# Patient Record
Sex: Female | Born: 1940 | Race: White | Hispanic: No | Marital: Married | Smoking: Never smoker
Health system: Southern US, Community
[De-identification: ages and names within clinical notes are randomized; demographics above are authoritative.]

## PROBLEM LIST (undated history)

## (undated) DIAGNOSIS — K219 Gastro-esophageal reflux disease without esophagitis: Secondary | ICD-10-CM

## (undated) DIAGNOSIS — K5790 Diverticulosis of intestine, part unspecified, without perforation or abscess without bleeding: Secondary | ICD-10-CM

## (undated) DIAGNOSIS — J309 Allergic rhinitis, unspecified: Secondary | ICD-10-CM

## (undated) DIAGNOSIS — I4819 Other persistent atrial fibrillation: Secondary | ICD-10-CM

## (undated) DIAGNOSIS — M545 Low back pain, unspecified: Secondary | ICD-10-CM

## (undated) DIAGNOSIS — K449 Diaphragmatic hernia without obstruction or gangrene: Secondary | ICD-10-CM

## (undated) DIAGNOSIS — G8929 Other chronic pain: Secondary | ICD-10-CM

## (undated) DIAGNOSIS — I5022 Chronic systolic (congestive) heart failure: Secondary | ICD-10-CM

## (undated) DIAGNOSIS — I639 Cerebral infarction, unspecified: Secondary | ICD-10-CM

## (undated) DIAGNOSIS — F329 Major depressive disorder, single episode, unspecified: Secondary | ICD-10-CM

## (undated) DIAGNOSIS — I340 Nonrheumatic mitral (valve) insufficiency: Secondary | ICD-10-CM

## (undated) DIAGNOSIS — IMO0002 Reserved for concepts with insufficient information to code with codable children: Secondary | ICD-10-CM

## (undated) DIAGNOSIS — F32A Depression, unspecified: Secondary | ICD-10-CM

## (undated) DIAGNOSIS — N3281 Overactive bladder: Secondary | ICD-10-CM

## (undated) DIAGNOSIS — K2270 Barrett's esophagus without dysplasia: Secondary | ICD-10-CM

## (undated) DIAGNOSIS — I517 Cardiomegaly: Secondary | ICD-10-CM

## (undated) DIAGNOSIS — I10 Essential (primary) hypertension: Secondary | ICD-10-CM

## (undated) DIAGNOSIS — G4730 Sleep apnea, unspecified: Secondary | ICD-10-CM

## (undated) DIAGNOSIS — C50919 Malignant neoplasm of unspecified site of unspecified female breast: Secondary | ICD-10-CM

## (undated) DIAGNOSIS — I4891 Unspecified atrial fibrillation: Secondary | ICD-10-CM

## (undated) DIAGNOSIS — E039 Hypothyroidism, unspecified: Secondary | ICD-10-CM

## (undated) DIAGNOSIS — F419 Anxiety disorder, unspecified: Secondary | ICD-10-CM

## (undated) HISTORY — DX: Other persistent atrial fibrillation: I48.19

## (undated) HISTORY — DX: Nonrheumatic mitral (valve) insufficiency: I34.0

## (undated) HISTORY — PX: COLONOSCOPY: SHX174

## (undated) HISTORY — DX: Hypothyroidism, unspecified: E03.9

## (undated) HISTORY — DX: Major depressive disorder, single episode, unspecified: F32.9

## (undated) HISTORY — PX: ESOPHAGUS SURGERY: SHX626

## (undated) HISTORY — DX: Diaphragmatic hernia without obstruction or gangrene: K44.9

## (undated) HISTORY — PX: TONSILLECTOMY: SUR1361

## (undated) HISTORY — DX: Gastro-esophageal reflux disease without esophagitis: K21.9

## (undated) HISTORY — PX: MOHS SURGERY: SUR867

## (undated) HISTORY — DX: Essential (primary) hypertension: I10

## (undated) HISTORY — PX: LUMBAR DISC SURGERY: SHX700

## (undated) HISTORY — PX: BACK SURGERY: SHX140

## (undated) HISTORY — DX: Unspecified atrial fibrillation: I48.91

## (undated) HISTORY — DX: Cerebral infarction, unspecified: I63.9

## (undated) HISTORY — DX: Allergic rhinitis, unspecified: J30.9

## (undated) HISTORY — DX: Diverticulosis of intestine, part unspecified, without perforation or abscess without bleeding: K57.90

## (undated) HISTORY — DX: Cardiomegaly: I51.7

## (undated) HISTORY — PX: FRACTURE SURGERY: SHX138

## (undated) HISTORY — PX: ESOPHAGOGASTRODUODENOSCOPY: SHX1529

## (undated) HISTORY — DX: Depression, unspecified: F32.A

## (undated) HISTORY — DX: Barrett's esophagus without dysplasia: K22.70

---

## 1978-10-08 HISTORY — PX: TUBAL LIGATION: SHX77

## 1993-10-08 HISTORY — PX: CARPAL TUNNEL RELEASE: SHX101

## 1999-11-20 ENCOUNTER — Other Ambulatory Visit: Admission: RE | Admit: 1999-11-20 | Discharge: 1999-11-20 | Payer: Self-pay | Admitting: Obstetrics & Gynecology

## 2000-12-23 ENCOUNTER — Other Ambulatory Visit: Admission: RE | Admit: 2000-12-23 | Discharge: 2000-12-23 | Payer: Self-pay | Admitting: Obstetrics & Gynecology

## 2001-08-19 ENCOUNTER — Other Ambulatory Visit: Admission: RE | Admit: 2001-08-19 | Discharge: 2001-08-19 | Payer: Self-pay | Admitting: Obstetrics & Gynecology

## 2002-08-17 ENCOUNTER — Other Ambulatory Visit: Admission: RE | Admit: 2002-08-17 | Discharge: 2002-08-17 | Payer: Self-pay | Admitting: Obstetrics & Gynecology

## 2003-10-04 ENCOUNTER — Other Ambulatory Visit: Admission: RE | Admit: 2003-10-04 | Discharge: 2003-10-04 | Payer: Self-pay | Admitting: Obstetrics & Gynecology

## 2003-10-09 DIAGNOSIS — C50919 Malignant neoplasm of unspecified site of unspecified female breast: Secondary | ICD-10-CM

## 2003-10-09 DIAGNOSIS — Z923 Personal history of irradiation: Secondary | ICD-10-CM

## 2003-10-09 HISTORY — DX: Personal history of irradiation: Z92.3

## 2003-10-09 HISTORY — PX: BREAST BIOPSY: SHX20

## 2003-10-09 HISTORY — PX: BREAST LUMPECTOMY: SHX2

## 2003-10-09 HISTORY — DX: Malignant neoplasm of unspecified site of unspecified female breast: C50.919

## 2003-10-14 ENCOUNTER — Encounter (INDEPENDENT_AMBULATORY_CARE_PROVIDER_SITE_OTHER): Payer: Self-pay | Admitting: Specialist

## 2003-10-14 ENCOUNTER — Encounter (INDEPENDENT_AMBULATORY_CARE_PROVIDER_SITE_OTHER): Payer: Self-pay | Admitting: Obstetrics & Gynecology

## 2003-10-14 ENCOUNTER — Encounter: Admission: RE | Admit: 2003-10-14 | Discharge: 2003-10-14 | Payer: Self-pay | Admitting: Obstetrics & Gynecology

## 2003-10-26 ENCOUNTER — Encounter (HOSPITAL_COMMUNITY): Admission: RE | Admit: 2003-10-26 | Discharge: 2004-01-24 | Payer: Self-pay | Admitting: General Surgery

## 2003-10-28 ENCOUNTER — Encounter: Admission: RE | Admit: 2003-10-28 | Discharge: 2003-10-28 | Payer: Self-pay | Admitting: General Surgery

## 2003-11-01 ENCOUNTER — Encounter: Admission: RE | Admit: 2003-11-01 | Discharge: 2003-11-01 | Payer: Self-pay | Admitting: General Surgery

## 2003-11-01 ENCOUNTER — Ambulatory Visit (HOSPITAL_BASED_OUTPATIENT_CLINIC_OR_DEPARTMENT_OTHER): Admission: RE | Admit: 2003-11-01 | Discharge: 2003-11-01 | Payer: Self-pay | Admitting: General Surgery

## 2003-11-01 ENCOUNTER — Ambulatory Visit (HOSPITAL_COMMUNITY): Admission: RE | Admit: 2003-11-01 | Discharge: 2003-11-01 | Payer: Self-pay | Admitting: General Surgery

## 2003-11-01 ENCOUNTER — Encounter (INDEPENDENT_AMBULATORY_CARE_PROVIDER_SITE_OTHER): Payer: Self-pay | Admitting: *Deleted

## 2003-11-22 ENCOUNTER — Ambulatory Visit: Admission: RE | Admit: 2003-11-22 | Discharge: 2004-01-28 | Payer: Self-pay | Admitting: Radiation Oncology

## 2003-11-23 ENCOUNTER — Ambulatory Visit (HOSPITAL_COMMUNITY): Admission: RE | Admit: 2003-11-23 | Discharge: 2003-11-23 | Payer: Self-pay | Admitting: Oncology

## 2003-11-24 ENCOUNTER — Ambulatory Visit (HOSPITAL_COMMUNITY): Admission: RE | Admit: 2003-11-24 | Discharge: 2003-11-24 | Payer: Self-pay | Admitting: Oncology

## 2004-03-02 ENCOUNTER — Ambulatory Visit: Admission: RE | Admit: 2004-03-02 | Discharge: 2004-03-02 | Payer: Self-pay | Admitting: Radiation Oncology

## 2004-07-25 ENCOUNTER — Encounter: Admission: RE | Admit: 2004-07-25 | Discharge: 2004-07-25 | Payer: Self-pay | Admitting: Oncology

## 2004-10-11 ENCOUNTER — Encounter: Admission: RE | Admit: 2004-10-11 | Discharge: 2004-10-11 | Payer: Self-pay | Admitting: General Surgery

## 2004-11-15 ENCOUNTER — Ambulatory Visit: Payer: Self-pay | Admitting: Oncology

## 2005-01-18 ENCOUNTER — Ambulatory Visit: Payer: Self-pay | Admitting: Oncology

## 2005-07-25 ENCOUNTER — Ambulatory Visit: Payer: Self-pay | Admitting: Oncology

## 2005-07-31 ENCOUNTER — Encounter: Admission: RE | Admit: 2005-07-31 | Discharge: 2005-07-31 | Payer: Self-pay | Admitting: Oncology

## 2006-01-17 ENCOUNTER — Ambulatory Visit: Payer: Self-pay | Admitting: Oncology

## 2006-01-21 LAB — CBC WITH DIFFERENTIAL/PLATELET
BASO%: 0.7 % (ref 0.0–2.0)
EOS%: 5.7 % (ref 0.0–7.0)
HCT: 38.9 % (ref 34.8–46.6)
LYMPH%: 22.5 % (ref 14.0–48.0)
MCH: 26.3 pg (ref 26.0–34.0)
MCHC: 33.1 g/dL (ref 32.0–36.0)
MONO#: 0.6 10*3/uL (ref 0.1–0.9)
NEUT%: 63.9 % (ref 39.6–76.8)
RBC: 4.91 10*6/uL (ref 3.70–5.32)
WBC: 8 10*3/uL (ref 3.9–10.0)
lymph#: 1.8 10*3/uL (ref 0.9–3.3)

## 2006-01-21 LAB — COMPREHENSIVE METABOLIC PANEL
ALT: 10 U/L (ref 0–40)
CO2: 30 mEq/L (ref 19–32)
Sodium: 140 mEq/L (ref 135–145)
Total Bilirubin: 0.7 mg/dL (ref 0.3–1.2)
Total Protein: 7.5 g/dL (ref 6.0–8.3)

## 2006-01-21 LAB — CANCER ANTIGEN 27.29: CA 27.29: 22 U/mL (ref 0–39)

## 2006-02-19 ENCOUNTER — Encounter: Admission: RE | Admit: 2006-02-19 | Discharge: 2006-02-19 | Payer: Self-pay | Admitting: Oncology

## 2006-07-11 ENCOUNTER — Ambulatory Visit: Payer: Self-pay | Admitting: Oncology

## 2007-01-14 ENCOUNTER — Ambulatory Visit: Payer: Self-pay | Admitting: Oncology

## 2007-01-16 LAB — CBC WITH DIFFERENTIAL/PLATELET
BASO%: 0.6 % (ref 0.0–2.0)
EOS%: 4 % (ref 0.0–7.0)
LYMPH%: 21.6 % (ref 14.0–48.0)
MCH: 25.4 pg — ABNORMAL LOW (ref 26.0–34.0)
MCHC: 33.5 g/dL (ref 32.0–36.0)
MCV: 75.8 fL — ABNORMAL LOW (ref 81.0–101.0)
MONO#: 0.5 10*3/uL (ref 0.1–0.9)
MONO%: 6.6 % (ref 0.0–13.0)
Platelets: 418 10*3/uL — ABNORMAL HIGH (ref 145–400)
RBC: 4.65 10*6/uL (ref 3.70–5.32)
WBC: 7.9 10*3/uL (ref 3.9–10.0)

## 2007-01-16 LAB — COMPREHENSIVE METABOLIC PANEL
ALT: 9 U/L (ref 0–35)
Alkaline Phosphatase: 72 U/L (ref 39–117)
Sodium: 137 mEq/L (ref 135–145)
Total Bilirubin: 0.7 mg/dL (ref 0.3–1.2)
Total Protein: 7.3 g/dL (ref 6.0–8.3)

## 2007-01-24 LAB — CBC & DIFF AND RETIC
Basophils Absolute: 0 10*3/uL (ref 0.0–0.1)
Eosinophils Absolute: 0.4 10*3/uL (ref 0.0–0.5)
HGB: 11.8 g/dL (ref 11.6–15.9)
LYMPH%: 19.5 % (ref 14.0–48.0)
MCV: 75.8 fL — ABNORMAL LOW (ref 81.0–101.0)
MONO%: 6.5 % (ref 0.0–13.0)
NEUT#: 6 10*3/uL (ref 1.5–6.5)
Platelets: 396 10*3/uL (ref 145–400)
RBC: 4.67 10*6/uL (ref 3.70–5.32)

## 2007-01-24 LAB — IRON AND TIBC
%SAT: 9 % — ABNORMAL LOW (ref 20–55)
Iron: 33 ug/dL — ABNORMAL LOW (ref 42–145)
UIBC: 351 ug/dL

## 2007-01-29 ENCOUNTER — Ambulatory Visit: Payer: Self-pay | Admitting: Internal Medicine

## 2007-02-12 ENCOUNTER — Ambulatory Visit: Payer: Self-pay | Admitting: Internal Medicine

## 2007-02-25 ENCOUNTER — Encounter: Admission: RE | Admit: 2007-02-25 | Discharge: 2007-02-25 | Payer: Self-pay | Admitting: Oncology

## 2007-07-14 ENCOUNTER — Encounter: Admission: RE | Admit: 2007-07-14 | Discharge: 2007-07-14 | Payer: Self-pay | Admitting: Oncology

## 2007-07-30 ENCOUNTER — Ambulatory Visit: Payer: Self-pay | Admitting: Oncology

## 2007-08-01 LAB — CBC & DIFF AND RETIC
BASO%: 0.5 % (ref 0.0–2.0)
EOS%: 5.5 % (ref 0.0–7.0)
HCT: 36.9 % (ref 34.8–46.6)
IRF: 0.39 — ABNORMAL HIGH (ref 0.130–0.330)
MCH: 25.7 pg — ABNORMAL LOW (ref 26.0–34.0)
MCHC: 33.4 g/dL (ref 32.0–36.0)
NEUT%: 64.7 % (ref 39.6–76.8)
RBC: 4.79 10*6/uL (ref 3.70–5.32)
RDW: 15.2 % — ABNORMAL HIGH (ref 11.3–14.5)
lymph#: 1.9 10*3/uL (ref 0.9–3.3)

## 2007-08-01 LAB — COMPREHENSIVE METABOLIC PANEL
AST: 16 U/L (ref 0–37)
BUN: 21 mg/dL (ref 6–23)
Calcium: 9.2 mg/dL (ref 8.4–10.5)
Chloride: 99 mEq/L (ref 96–112)
Creatinine, Ser: 1.16 mg/dL (ref 0.40–1.20)

## 2007-08-01 LAB — IRON AND TIBC
%SAT: 13 % — ABNORMAL LOW (ref 20–55)
Iron: 52 ug/dL (ref 42–145)
TIBC: 409 ug/dL (ref 250–470)

## 2007-08-01 LAB — MORPHOLOGY

## 2007-08-08 LAB — COMPREHENSIVE METABOLIC PANEL
ALT: 11 U/L (ref 0–35)
AST: 16 U/L (ref 0–37)
Albumin: 4 g/dL (ref 3.5–5.2)
Alkaline Phosphatase: 74 U/L (ref 39–117)
Calcium: 9.5 mg/dL (ref 8.4–10.5)
Chloride: 101 mEq/L (ref 96–112)
Creatinine, Ser: 1.25 mg/dL — ABNORMAL HIGH (ref 0.40–1.20)
Potassium: 3.6 mEq/L (ref 3.5–5.3)

## 2007-08-08 LAB — CBC & DIFF AND RETIC
Basophils Absolute: 0 10*3/uL (ref 0.0–0.1)
Eosinophils Absolute: 0.4 10*3/uL (ref 0.0–0.5)
HGB: 12.4 g/dL (ref 11.6–15.9)
IRF: 0.34 — ABNORMAL HIGH (ref 0.130–0.330)
NEUT#: 6.4 10*3/uL (ref 1.5–6.5)
RDW: 15.4 % — ABNORMAL HIGH (ref 11.3–14.5)
RETIC #: 97.6 10*3/uL (ref 19.7–115.1)
WBC: 9.3 10*3/uL (ref 3.9–10.0)
lymph#: 1.9 10*3/uL (ref 0.9–3.3)

## 2007-08-08 LAB — MORPHOLOGY: PLT EST: INCREASED

## 2007-08-08 LAB — CHCC SMEAR

## 2007-10-09 HISTORY — PX: ELBOW FRACTURE SURGERY: SHX616

## 2008-01-23 ENCOUNTER — Ambulatory Visit: Payer: Self-pay | Admitting: Oncology

## 2008-01-27 LAB — CBC WITH DIFFERENTIAL/PLATELET
BASO%: 0.7 % (ref 0.0–2.0)
EOS%: 8.3 % — ABNORMAL HIGH (ref 0.0–7.0)
HCT: 37.8 % (ref 34.8–46.6)
MCH: 26.7 pg (ref 26.0–34.0)
MCHC: 33.7 g/dL (ref 32.0–36.0)
MONO#: 0.4 10*3/uL (ref 0.1–0.9)
NEUT%: 59.8 % (ref 39.6–76.8)
RBC: 4.76 10*6/uL (ref 3.70–5.32)
RDW: 15.3 % — ABNORMAL HIGH (ref 11.3–14.5)
WBC: 8.1 10*3/uL (ref 3.9–10.0)
lymph#: 2.1 10*3/uL (ref 0.9–3.3)

## 2008-01-27 LAB — COMPREHENSIVE METABOLIC PANEL
ALT: 12 U/L (ref 0–35)
AST: 15 U/L (ref 0–37)
CO2: 27 mEq/L (ref 19–32)
Calcium: 9.3 mg/dL (ref 8.4–10.5)
Chloride: 104 mEq/L (ref 96–112)
Potassium: 3.7 mEq/L (ref 3.5–5.3)
Sodium: 141 mEq/L (ref 135–145)
Total Protein: 7.1 g/dL (ref 6.0–8.3)

## 2008-02-05 ENCOUNTER — Encounter: Payer: Self-pay | Admitting: Internal Medicine

## 2008-02-26 ENCOUNTER — Encounter: Admission: RE | Admit: 2008-02-26 | Discharge: 2008-02-26 | Payer: Self-pay | Admitting: Oncology

## 2008-08-24 ENCOUNTER — Ambulatory Visit: Payer: Self-pay | Admitting: Oncology

## 2008-08-26 LAB — CBC WITH DIFFERENTIAL/PLATELET
BASO%: 0.7 % (ref 0.0–2.0)
Eosinophils Absolute: 0.6 10*3/uL — ABNORMAL HIGH (ref 0.0–0.5)
LYMPH%: 22 % (ref 14.0–48.0)
MCHC: 33.2 g/dL (ref 32.0–36.0)
MCV: 83.6 fL (ref 81.0–101.0)
MONO#: 0.5 10*3/uL (ref 0.1–0.9)
MONO%: 5.5 % (ref 0.0–13.0)
NEUT#: 5.8 10*3/uL (ref 1.5–6.5)
Platelets: 341 10*3/uL (ref 145–400)
RBC: 4.8 10*6/uL (ref 3.70–5.32)
RDW: 13.8 % (ref 11.3–14.5)
WBC: 8.9 10*3/uL (ref 3.9–10.0)

## 2008-08-27 LAB — COMPREHENSIVE METABOLIC PANEL
ALT: 8 U/L (ref 0–35)
Albumin: 3.9 g/dL (ref 3.5–5.2)
Alkaline Phosphatase: 56 U/L (ref 39–117)
Glucose, Bld: 110 mg/dL — ABNORMAL HIGH (ref 70–99)
Potassium: 3.9 mEq/L (ref 3.5–5.3)
Sodium: 136 mEq/L (ref 135–145)
Total Bilirubin: 0.7 mg/dL (ref 0.3–1.2)
Total Protein: 6.9 g/dL (ref 6.0–8.3)

## 2008-08-27 LAB — CANCER ANTIGEN 27.29: CA 27.29: 21 U/mL (ref 0–39)

## 2008-08-27 LAB — VITAMIN D 25 HYDROXY (VIT D DEFICIENCY, FRACTURES): Vit D, 25-Hydroxy: 41 ng/mL (ref 30–89)

## 2008-08-31 ENCOUNTER — Encounter: Payer: Self-pay | Admitting: Internal Medicine

## 2008-09-27 ENCOUNTER — Emergency Department (HOSPITAL_BASED_OUTPATIENT_CLINIC_OR_DEPARTMENT_OTHER): Admission: EM | Admit: 2008-09-27 | Discharge: 2008-09-27 | Payer: Self-pay | Admitting: Emergency Medicine

## 2008-09-27 ENCOUNTER — Ambulatory Visit: Payer: Self-pay | Admitting: Diagnostic Radiology

## 2008-09-29 ENCOUNTER — Encounter: Payer: Self-pay | Admitting: Internal Medicine

## 2008-10-04 ENCOUNTER — Inpatient Hospital Stay (HOSPITAL_COMMUNITY): Admission: RE | Admit: 2008-10-04 | Discharge: 2008-10-07 | Payer: Self-pay | Admitting: Orthopedic Surgery

## 2008-10-18 ENCOUNTER — Encounter: Payer: Self-pay | Admitting: Internal Medicine

## 2008-10-28 ENCOUNTER — Encounter: Payer: Self-pay | Admitting: Internal Medicine

## 2008-11-17 ENCOUNTER — Encounter: Payer: Self-pay | Admitting: Internal Medicine

## 2009-01-28 ENCOUNTER — Ambulatory Visit: Payer: Self-pay | Admitting: Oncology

## 2009-02-01 ENCOUNTER — Encounter: Payer: Self-pay | Admitting: Internal Medicine

## 2009-02-01 LAB — COMPREHENSIVE METABOLIC PANEL
ALT: <8 U/L (ref 0–35)
Albumin: 4 g/dL (ref 3.5–5.2)
CO2: 27 mEq/L (ref 19–32)
Glucose, Bld: 90 mg/dL (ref 70–99)
Potassium: 4 mEq/L (ref 3.5–5.3)
Sodium: 139 mEq/L (ref 135–145)
Total Protein: 7.5 g/dL (ref 6.0–8.3)

## 2009-02-01 LAB — CBC WITH DIFFERENTIAL/PLATELET
BASO%: 0.6 % (ref 0.0–2.0)
Eosinophils Absolute: 0.6 10*3/uL — ABNORMAL HIGH (ref 0.0–0.5)
MCHC: 32.5 g/dL (ref 31.5–36.0)
MONO#: 0.5 10*3/uL (ref 0.1–0.9)
NEUT#: 5.5 10*3/uL (ref 1.5–6.5)
RBC: 4.93 10*6/uL (ref 3.70–5.45)
RDW: 14.6 % — ABNORMAL HIGH (ref 11.2–14.5)
WBC: 8.9 10*3/uL (ref 3.9–10.3)
lymph#: 2.2 10*3/uL (ref 0.9–3.3)

## 2009-02-28 ENCOUNTER — Encounter: Admission: RE | Admit: 2009-02-28 | Discharge: 2009-02-28 | Payer: Self-pay | Admitting: Oncology

## 2009-07-21 ENCOUNTER — Ambulatory Visit: Payer: Self-pay | Admitting: Oncology

## 2009-07-25 ENCOUNTER — Encounter: Payer: Self-pay | Admitting: Internal Medicine

## 2009-07-25 ENCOUNTER — Encounter: Admission: RE | Admit: 2009-07-25 | Discharge: 2009-07-25 | Payer: Self-pay | Admitting: Oncology

## 2009-07-25 LAB — CBC WITH DIFFERENTIAL/PLATELET
BASO%: 0.8 % (ref 0.0–2.0)
Basophils Absolute: 0.1 10*3/uL (ref 0.0–0.1)
HCT: 39.5 % (ref 34.8–46.6)
LYMPH%: 19.7 % (ref 14.0–49.7)
MCH: 26.5 pg (ref 25.1–34.0)
MCHC: 32.6 g/dL (ref 31.5–36.0)
MONO#: 0.5 10*3/uL (ref 0.1–0.9)
NEUT%: 64.9 % (ref 38.4–76.8)
Platelets: 364 10*3/uL (ref 145–400)
WBC: 8.6 10*3/uL (ref 3.9–10.3)

## 2009-07-26 LAB — VITAMIN D 25 HYDROXY (VIT D DEFICIENCY, FRACTURES): Vit D, 25-Hydroxy: 40 ng/mL (ref 30–89)

## 2009-07-26 LAB — COMPREHENSIVE METABOLIC PANEL
ALT: <8 U/L (ref 0–35)
BUN: 18 mg/dL (ref 6–23)
CO2: 28 mEq/L (ref 19–32)
Creatinine, Ser: 1.04 mg/dL (ref 0.40–1.20)
Total Bilirubin: 0.4 mg/dL (ref 0.3–1.2)

## 2009-07-26 LAB — CANCER ANTIGEN 27.29: CA 27.29: 22 U/mL (ref 0–39)

## 2009-07-26 LAB — LACTATE DEHYDROGENASE: LDH: 157 U/L (ref 94–250)

## 2009-08-03 ENCOUNTER — Encounter (INDEPENDENT_AMBULATORY_CARE_PROVIDER_SITE_OTHER): Payer: Self-pay | Admitting: *Deleted

## 2009-08-19 ENCOUNTER — Encounter (INDEPENDENT_AMBULATORY_CARE_PROVIDER_SITE_OTHER): Payer: Self-pay | Admitting: *Deleted

## 2009-08-22 ENCOUNTER — Ambulatory Visit: Payer: Self-pay | Admitting: Internal Medicine

## 2009-09-06 ENCOUNTER — Ambulatory Visit: Payer: Self-pay | Admitting: Internal Medicine

## 2009-09-12 ENCOUNTER — Encounter: Payer: Self-pay | Admitting: Internal Medicine

## 2010-01-16 ENCOUNTER — Ambulatory Visit: Payer: Self-pay | Admitting: Oncology

## 2010-01-18 ENCOUNTER — Encounter: Payer: Self-pay | Admitting: Internal Medicine

## 2010-01-18 LAB — CBC WITH DIFFERENTIAL/PLATELET
BASO%: 0.6 % (ref 0.0–2.0)
Basophils Absolute: 0.1 10*3/uL (ref 0.0–0.1)
Eosinophils Absolute: 0.6 10*3/uL — ABNORMAL HIGH (ref 0.0–0.5)
HCT: 38.6 % (ref 34.8–46.6)
HGB: 12.6 g/dL (ref 11.6–15.9)
MCHC: 32.5 g/dL (ref 31.5–36.0)
MONO#: 0.5 10*3/uL (ref 0.1–0.9)
NEUT#: 7.7 10*3/uL — ABNORMAL HIGH (ref 1.5–6.5)
NEUT%: 69.9 % (ref 38.4–76.8)
WBC: 11 10*3/uL — ABNORMAL HIGH (ref 3.9–10.3)
lymph#: 2.1 10*3/uL (ref 0.9–3.3)

## 2010-01-18 LAB — LACTATE DEHYDROGENASE: LDH: 138 U/L (ref 94–250)

## 2010-01-18 LAB — COMPREHENSIVE METABOLIC PANEL
ALT: 11 U/L (ref 0–35)
CO2: 24 mEq/L (ref 19–32)
Calcium: 9.1 mg/dL (ref 8.4–10.5)
Chloride: 100 mEq/L (ref 96–112)
Creatinine, Ser: 1.34 mg/dL — ABNORMAL HIGH (ref 0.40–1.20)
Total Protein: 7.5 g/dL (ref 6.0–8.3)

## 2010-01-19 LAB — FERRITIN: Ferritin: 13 ng/mL (ref 10–291)

## 2010-01-19 LAB — IRON AND TIBC: %SAT: 8 % — ABNORMAL LOW (ref 20–55)

## 2010-03-14 ENCOUNTER — Encounter: Admission: RE | Admit: 2010-03-14 | Discharge: 2010-03-14 | Payer: Self-pay | Admitting: Oncology

## 2010-07-13 ENCOUNTER — Encounter: Payer: Self-pay | Admitting: Internal Medicine

## 2010-07-31 ENCOUNTER — Encounter (INDEPENDENT_AMBULATORY_CARE_PROVIDER_SITE_OTHER): Payer: Self-pay | Admitting: *Deleted

## 2010-08-23 ENCOUNTER — Encounter (INDEPENDENT_AMBULATORY_CARE_PROVIDER_SITE_OTHER): Payer: Self-pay | Admitting: *Deleted

## 2010-08-24 ENCOUNTER — Ambulatory Visit: Payer: Self-pay | Admitting: Internal Medicine

## 2010-08-30 ENCOUNTER — Telehealth (INDEPENDENT_AMBULATORY_CARE_PROVIDER_SITE_OTHER): Payer: Self-pay | Admitting: *Deleted

## 2010-09-07 ENCOUNTER — Ambulatory Visit: Payer: Self-pay | Admitting: Internal Medicine

## 2010-09-08 ENCOUNTER — Encounter: Payer: Self-pay | Admitting: Internal Medicine

## 2010-10-28 ENCOUNTER — Encounter: Payer: Self-pay | Admitting: General Surgery

## 2010-10-29 ENCOUNTER — Encounter: Payer: Self-pay | Admitting: Oncology

## 2010-11-07 NOTE — Miscellaneous (Signed)
Summary: [facility] PREVISIT/PREP  Clinical Lists Changes  Observations: Added new observation of NKA: T (08/24/2010 10:30)

## 2010-11-07 NOTE — Letter (Signed)
Summary: EGD Instructions  [facility]  [address] [place], [place] [address]   Phone: [phone]  Fax: [phone]       Teresa York    [birth year]    MRN: [id number]       Procedure Day [name]:  [name]  09/07/10     Arrival Time:   10:30AM     Procedure Time:  11:30AM     Location of Procedure:                    [name] ([facility]th [facility])    PREPARATION FOR ENDOSCOPY   On 09/07/10 THE DAY OF THE PROCEDURE:  1.   No solid foods, milk or milk products are allowed after midnight the night before your procedure.  2.   Do not drink anything colored red or purple.  Avoid juices with pulp.  No orange juice.  3.  You may drink clear liquids until 9:30AM, which is 2 hours before your procedure.                                                                                                CLEAR LIQUIDS INCLUDE: Water Jello Ice Popsicles Tea (sugar ok, no milk/cream) Powdered fruit flavored drinks Coffee (sugar ok, no milk/cream) Gatorade Juice: apple, white grape, white cranberry  Lemonade Clear bullion, consomm, broth Carbonated beverages (any kind) Strained chicken noodle soup Hard Candy   MEDICATION INSTRUCTIONS  Unless otherwise instructed, you should take regular prescription medications with a small sip of water as early as possible the morning of your procedure.  Additional medication instructions: Hold iron the morning of procedure.             OTHER INSTRUCTIONS  You will need a responsible adult at least 72 years of age to accompany you and drive you home.   This person must remain in the waiting room during your procedure.  Wear loose fitting clothing that is easily removed.  Leave jewelry and other valuables at home.  However, you may wish to bring a book to read or an iPod/MP3 player to listen to music as you wait for your procedure to start.  Remove all body piercing jewelry and leave at home.  Total time from  sign-in until discharge is approximately 2-3 hours.  You should go home directly after your procedure and rest.  You can resume normal activities the day after your procedure.  The day of your procedure you should not:   Drive   Make legal decisions   Operate machinery   Drink alcohol   Return to work  You will receive specific instructions about eating, activities and medications before you leave.    The above instructions have been reviewed and explained to me by  Wyona Almas RN  August 24, 2010 11:05 AM     I fully understand and can verbalize these instructions _____________________________ Date _________

## 2010-11-07 NOTE — Letter (Signed)
Summary: Pre Visit Letter Revised  [facility]  [address] [place], [place] [address]   Phone: [phone]  Fax: [phone]        07/31/2010 MRN: [id number] Teresa York [address] [place], [place]  [address]             Procedure Date:  09/05/2010   Welcome to the Gastroenterology Division at [facility].    You are scheduled to see a nurse for your pre-procedure visit on 08/24/2010 at 10:30AM on the 3rd floor at [facility], [address]  We ask that you try to arrive at our office 15 minutes prior to your appointment time to allow for check-in.  Please take a minute to review the attached form.  If you answer "Yes" to one or more of the questions on the first page, we ask that you call the person listed at your earliest opportunity.  If you answer "No" to all of the questions, please complete the rest of the form and bring it to your appointment.    Your nurse visit will consist of discussing your medical and surgical history, your immediate family medical history, and your medications.   If you are unable to list all of your medications on the form, please bring the medication bottles to your appointment and we will list them.  We will need to be aware of both prescribed and over the counter drugs.  We will need to know exact dosage information as well.    Please be prepared to read and sign documents such as consent forms, a financial agreement, and acknowledgement forms.  If necessary, and with your consent, a friend or relative is welcome to sit-in on the nurse visit with you.  Please bring your insurance card so that we may make a copy of it.  If your insurance requires a referral to see a specialist, please bring your referral form from your primary care physician.  No co-pay is required for this nurse visit.     If you cannot keep your appointment, please call [phone] to cancel or reschedule prior to your appointment date.  This  allows [place] the opportunity to schedule an appointment for another patient in need of care.    Thank you for choosing [facility] for your medical needs.  We appreciate the opportunity to care for you.  Please visit [place] at our website  to learn more about our practice.  Sincerely, The Gastroenterology Division

## 2010-11-07 NOTE — Letter (Signed)
Summary: Endoscopy Letter  [facility]  [address] [place], [place] [address]   Phone: [phone]  Fax: [phone]      July 13, 2010 MRN: [id number]   DENELDA AKERLEY [address] [place], [place]  [address]   Dear Ms. BMWUX,   According to your medical record, it is time for you to schedule an Endoscopy. Endoscopic screening is recommended for patients with certain upper digestive tract conditions because of associated increased risk for cancers of the upper digestive system.  This letter has been generated based on the recommendations made at the time of your prior procedure. If you feel that in your particular situation this may no longer apply, please contact our office.  Please call our office at [phone]) to schedule this appointment or to update your records at your earliest convenience.  Thank you for cooperating with [place] to provide you with the very best care possible.   Sincerely,  Wilhemina Bonito. Marina Goodell, M.D.  [facility] Gastroenterology Division [phone]

## 2010-11-07 NOTE — Progress Notes (Signed)
  Phone Note Outgoing Call   Summary of Call: Pt. ntfd. that egd time for 09/07/10 has been move from 11:30 to 11 a.m. Pt. re-instructed. Initial call taken by: Teryl Lucy RN,  August 30, 2010 9:27 AM     Appended Document: Upper Endoscopy     Procedures Next Due Date:    EGD: 09/2011  Appended Document: Patient Notice-Barrett's Esopghagus Letter mailed

## 2010-11-07 NOTE — Letter (Signed)
[facility]   Imported By: Sherian Rein 02/14/2010 11:08:14  _____________________________________________________________________  External Attachment:    Type:   Image     Comment:   External Document

## 2010-11-09 NOTE — Letter (Signed)
Summary: Patient Notice-Barrett's [facility]  [address] [place], [place] [address]   Phone: [phone]  Fax: [phone]        September 08, 2010 MRN: [id number]    Teresa York [address] [place], [place]  [address]    Dear Ms. Burr,  I am pleased to inform you that the biopsies taken during your recent endoscopic examination did not show any evidence of cancer upon pathologic examination.  However, your biopsies indicate you have a condition known as Barrett's esophagus. While not cancer, it is pre-cancerous (can progress to cancer) and needs to be monitored with repeat endoscopic examination and biopsies.  Fortunately, it is quite rare that this develops into cancer, but careful monitoring of the condition along with taking your medication as prescribed is important in reducing the risk of developing cancer.  It is my recommendation that you have a repeat upper gastrointestinal endoscopic examination in ONE year.    Additional information/recommendations:  __Continue with treatment plan as outlined the day of your exam.     Please call us if you have or develop heartburn, reflux symptoms, any swallowing problems, or if you have questions about your condition that have not been fully answered at this time.  Sincerely,  Hilarie Fredrickson MD  This letter has been electronically signed by your physician.  Appended Document: Patient Notice-Barrett's Esopghagus Letter mailed

## 2010-11-09 NOTE — Procedures (Signed)
Summary: Upper Endoscopy  Patient: Teresa York Note: All result statuses are Final unless otherwise noted.  Tests: (1) Upper Endoscopy (EGD)   EGD Upper Endoscopy       DONE     [facility]     [address]     [place], [place]  [address]           ENDOSCOPY PROCEDURE REPORT           PATIENT:  Teresa, York  MR#:  [id number]     BIRTHDATE:  01/13/1941, 69 yrs. old  GENDER:  female           ENDOSCOPIST:  Wilhemina Bonito. Eda Keys, MD     Referred by:  Surveillance Program Recall,           PROCEDURE DATE:  09/07/2010     PROCEDURE:  EGD with biopsy, [address]     ASA CLASS:  Class II     INDICATIONS:  h/o Barrett's Esophagus ; last EGD 08-2019 w/ atypia     indeterminate for dysplasia           MEDICATIONS:   Fentanyl 50 mcg IV, Versed 5 mg IV     TOPICAL ANESTHETIC:  Exactacain Spray           DESCRIPTION OF PROCEDURE:   After the risks benefits and     alternatives of the procedure were thoroughly explained, informed     consent was obtained.  The LB GIF-H180 [id number] endoscope was     introduced through the mouth and advanced to the second portion of     the duodenum, without limitations.  The instrument was slowly     withdrawn as the mucosa was fully examined.     <<PROCEDUREIMAGES>>           Barrett's esophagus was found in the distal 6cm of the esophagus     (24-30cm). S/P bx 4Q q 2cm (T=12). A stricture was found in the     esophagus at 24cm.  A large hiatal hernia with Sheria Lang erosions     was present.  The stomach was entered and closely examined. The     antrum, angularis, and lesser curvature were well visualized,     including a retroflexed view of the cardia and fundus. The stomach     wall was normally distensable. The scope passed easily through the     pylorus into the duodenum.  The duodenal bulb was normal in     appearance, as was the postbulbar duodenum.    Retroflexed views     revealed the hiatal hernia.    The scope was then withdrawn from     the  patient and the procedure completed.           COMPLICATIONS:  None           ENDOSCOPIC IMPRESSION:     1) Barrett's esophagus in the distal 6 cm of the esophagus     2) Stricture in the distal esophagus     3) Large Hiatal hernia with Sheria Lang erosions     4) Normal stomach     5) Normal duodenum           RECOMMENDATIONS:     1) Await biopsy results     2) REPEAT SURVEILLANCE EGD IN one YEAR     3) Continue PPI twice daily           ______________________________  Wilhemina Bonito. Eda Keys, MD           CC:  Lindaann Slough, MD, The Patient           n.     eSIGNED:   Wilhemina Bonito. Eda Keys at 09/07/2010 12:19 PM           Carilyn Goodpasture, [id number]  Note: An exclamation mark (!) indicates a result that was not dispersed into the flowsheet. Document Creation Date: 09/07/2010 12:20 PM _______________________________________________________________________  (1) Order result status: Final Collection or observation date-time: 09/07/2010 12:02 Requested date-time:  Receipt date-time:  Reported date-time:  Referring Physician:   Ordering Physician: Fransico Setters [phone]) Specimen Source:  Source: Launa Grill Order Number: [id number] Lab site:   Appended Document: Upper Endoscopy     Procedures Next Due Date:    EGD: 09/2011

## 2011-01-12 ENCOUNTER — Other Ambulatory Visit: Payer: Self-pay | Admitting: Oncology

## 2011-01-12 ENCOUNTER — Encounter (HOSPITAL_BASED_OUTPATIENT_CLINIC_OR_DEPARTMENT_OTHER): Payer: Medicare PPO | Admitting: Oncology

## 2011-01-12 DIAGNOSIS — C50919 Malignant neoplasm of unspecified site of unspecified female breast: Secondary | ICD-10-CM

## 2011-01-12 LAB — COMPREHENSIVE METABOLIC PANEL
BUN: 15 mg/dL (ref 6–23)
CO2: 23 mEq/L (ref 19–32)
Calcium: 9.7 mg/dL (ref 8.4–10.5)
Chloride: 103 mEq/L (ref 96–112)
Creatinine, Ser: 0.99 mg/dL (ref 0.40–1.20)

## 2011-01-12 LAB — CBC WITH DIFFERENTIAL/PLATELET
Basophils Absolute: 0 10*3/uL (ref 0.0–0.1)
HCT: 40.3 % (ref 34.8–46.6)
HGB: 13.4 g/dL (ref 11.6–15.9)
MONO#: 0.4 10*3/uL (ref 0.1–0.9)
NEUT%: 65.9 % (ref 38.4–76.8)
WBC: 5.7 10*3/uL (ref 3.9–10.3)
lymph#: 1 10*3/uL (ref 0.9–3.3)

## 2011-01-12 LAB — LACTATE DEHYDROGENASE: LDH: 170 U/L (ref 94–250)

## 2011-01-12 LAB — IRON AND TIBC
TIBC: 338 ug/dL (ref 250–470)
UIBC: 296 ug/dL

## 2011-01-19 ENCOUNTER — Other Ambulatory Visit: Payer: Self-pay | Admitting: Oncology

## 2011-01-19 ENCOUNTER — Encounter (HOSPITAL_BASED_OUTPATIENT_CLINIC_OR_DEPARTMENT_OTHER): Payer: Medicare PPO | Admitting: Oncology

## 2011-01-19 DIAGNOSIS — Z780 Asymptomatic menopausal state: Secondary | ICD-10-CM

## 2011-01-19 DIAGNOSIS — C50919 Malignant neoplasm of unspecified site of unspecified female breast: Secondary | ICD-10-CM

## 2011-01-19 DIAGNOSIS — Z9889 Other specified postprocedural states: Secondary | ICD-10-CM

## 2011-01-19 DIAGNOSIS — Z853 Personal history of malignant neoplasm of breast: Secondary | ICD-10-CM

## 2011-02-20 NOTE — Op Note (Signed)
NAME:  Teresa York, BRISBY NO.:  [id number]   MEDICAL RECORD NO.:  [id number]          PATIENT TYPE:  OIB   LOCATION:  5036                         FACILITY:  [facility]   PHYSICIAN:  Madelynn Done, MD  DATE OF BIRTH:  Apr 21, 1941   DATE OF PROCEDURE:  10/04/2008  DATE OF DISCHARGE:                               OPERATIVE REPORT   PREOPERATIVE DIAGNOSIS:  Right elbow fracture-dislocation with a  comminuted radial head fracture and lateral ulnar collateral ligament  tear.   POSTOPERATIVE DIAGNOSIS:  Right elbow fracture-dislocation with a  comminuted radial head fracture and lateral ulnar collateral ligament  tear.   ATTENDING SURGEON:  Madelynn Done, MD, who scrubbed and present for  the entire procedure.   ASSISTANT SURGEON:  None.   SURGICAL PROCEDURES:  1. Right elbow radial head arthroplasty.  2. Right elbow primary repair of lateral ulnar collateral ligament.  3. Open treatment of right elbow dislocation.  4. Radiographs, 3 views of right elbow.   SURGICAL IMPLANTS:  1. Acumed radial head arthroplasty, 22-mm head, 7-mm stem, +4 neck.  2. Two Arthrex 3.0 SutureTak anchors.   INTRAOPERATIVE FINDINGS:  The patient after repair of the radial head as  well as the lateral ulnar collateral ligament did have a good relative  stability to the elbow through a flexion and extension arc.  This was  not felt necessary to place an external, hinged elbow external fixator.   SURGICAL ANESTHESIA:  General via LMA.   TOURNIQUET TIME:  49 minutes at 250 mmHg.   SURGICAL INDICATIONS:  Teresa York is a 70 year old right-hand dominant  female who sustained a right elbow fracture-dislocation.  The patient  was reduced in the emergency department.  Preoperative CT scanning was  done, which showed a type 3 radial head fracture with a reduced joint.  The patient was consented for the above procedure.  Risks, benefits, and  alternatives were discussed in detail with the  patient and signed  informed consent was obtained.  Risks include but not limited to  bleeding; infection; damage to nearby nerves, arteries, or tendons;  instability; implant failure, loss of motion of the wrist or forearm;  need for further surgical intervention; heterotopic ossification.  Signed informed consent was obtained on the day of surgery.   DESCRIPTION OF THE PROCEDURE:  The patient was properly identified in  the preoperative holding area, mark with a permanent marker was made on  the right upper extremity to indicate the correct operative site.  The  patient was then brought back to the operating room, placed supine on  the anesthesia room table where general anesthesia was administered via  LMA.  The patient received preoperative antibiotics prior to any skin  incision.  The right upper extremity was sealed with a 1000 drape.  The  right upper extremity was then prepped with DuraPrep and sterilely  draped.  A time-out was called, the correct side was identified and  procedure was then begun.  Attention was turned to the right upper  extremity where examination under anesthesia was then carried out.  The  patient did have a relatively good stability with flexion and extension  arc of her right elbow prior to surgery.  Following this, a lateral  incision was then made directly over the lateral condylar region of the  elbow.  Dissection was carried down through the skin and subcutaneous  tissues.  The patient had pulled off portions of the common extensor  origin as well as the lateral ulnar collateral ligament.  The dissection  was already done and carried out all the way down to the joint.  Further  dissection was then carried proximally and distally to aid for good  exposure.  The joint was then opened.  There was a small fragment off  the lateral condyle, which was excised.  The capitellum was in good  condition.  The joint was then thoroughly irrigated.  The hematoma in   the joint was copiously irrigated and any small fragments were removed.  The radial head was in several pieces and those fragments were removed.  Then, indicated to perform radial head arthroplasty given the comminuted  type 3 radial head fracture and was not amenable to internal fixation.  Following this, a transverse saw was then used to make a neck cut in the  proximal radial head.  The starting awl was then used and the canal was  then found.  Increasing in size from 6-7 mm, the canal was prepared.  The 7-mm trial stem was then placed.  The head was assessed on the back  table and noted to be 22 mm.  The size measure was then placed down the  canal and noted to be a +4 neck.  Following the trialing, the trial  implants were then placed but noted to have it had good forearm motion  as well as elbow flexion and extension as well as pronation and  supination.  The trial implants were then removed, and the sterile  implant was then placed without any difficulty down the intramedullary  canal.  The joint was then reduced.  The elbow radiographs were then  obtained confirming the placement of the implant in all 3 planes.  Following this, the elbow was then placed through a range of motion, and  the preparation was then done to repair the lateral ulnar collateral  ligament.  Using the origin where the lateral ligament had been pulled  off the distal portion of the humerus, 2 SutureTaks were then placed in  the distal humerus.  Then, the lateral ulnar collateral ligament and  tissue was then brought up and tied in a pants-over-vest-type fashion in  order to tighten up the lateral ulnar collateral ligament and posterior  structures.  Following this, it was then oversewn with a 2-0 FiberWire  suture.  There was good repair of the ligamentous structures at the  lateral region of the elbow.  Prior to closure of the lateral ulnar  collateral ligament, the wounds were then thoroughly irrigated.   The  tourniquet was deflated after the arthroplasty was then placed.  A  stress radiography was then carried out of the elbow with flexion and  extension in pronation and supination.  The elbow was felt to be stable  through a relatively good flexion, extension, arc flexion well beyond  120 degrees extension probably 45 degrees.  She had good pronation and  supination.  Following this, the wounds were then thoroughly irrigated.  The remaining portion of the lateral structures were then repaired with  2-0 FiberWire sutures.  The  deep subcutaneous tissues closed with 2-0  Vicryl and the subcutaneous tissues closed with 4-0 Vicryl.  A 0.25% of  20 mL of Marcaine was infiltrated in the wounds.  The skin was then  closed with a running horizontal mattress 3-0 nylon.  Adaptic dressing  and a sterile compressive dressing was then applied.  The patient was  then placed in a long-arm keeping her forearm in pronation.  She was  then extubated and taken to recovery room in good condition.   POSTOPERATIVE PLAN:  The patient will be admitted overnight for IV  antibiotics and pain control.  She will be seen back in the office in 10  days for x-rays out of the splint keeping  her elbow at approximately 90  degrees and the forearm pronated, 3 weeks of immobilization in a long-  arm cast, and then begin a lateral ulnar collateral ligament and elbow  dislocation protocol.      Madelynn Done, MD  Electronically Signed     FWO/MEDQ  D:  10/04/2008  T:  10/05/2008  Job:  [id number]

## 2011-02-20 NOTE — Consult Note (Signed)
NAME:  ADALAI, PERL NO.:  [id number]   MEDICAL RECORD NO.:  [id number]          PATIENT TYPE:  INP   LOCATION:  5036                         FACILITY:  [facility]   PHYSICIAN:  Madelynn Done, MD  DATE OF BIRTH:  1941/07/31   DATE OF CONSULTATION:  DATE OF DISCHARGE:  10/07/2008                                 CONSULTATION   DISCHARGING PHYSICIAN:  Madelynn Done, MD   ADMISSION DIAGNOSES:  1. Right elbow fracture/dislocation.  2. Hypothyroidism.  3. Hypertension.   DISCHARGE DIAGNOSES:  1. Right elbow fracture/dislocation.  2. Hypothyroidism.  3. Hypertension.   PROCEDURE AND DATES:  Right elbow, open treatment of elbow dislocation  with radial head arthroplasty and lateral and collateral ligament repair  on October 04, 2008.   DISCHARGE MEDICATIONS AND DOSES:  1. Percocet 5/325 one to two tablets every 4-6 hours as needed for      pain.  2. Robaxin 500 mg p.o. q. 6 p.r.n. spasm.  3. Colace 100 mg p.o. b.i.d.  4. Vitamin C 500 mg p.o. b.i.d.   Resume home medications and doses.   REASON FOR ADMISSION:  Mrs. Doetsch is a 70 year old right-hand-dominant  female who sustained a right elbow fracture/dislocation.  The patient  was reduced in the emergency department, seen in the office, and  admitted to the hospital to undergo the above procedure.   HOSPITAL COURSE:  The patient was admitted to the Orthopedic floor  following the above procedure.  The patient tolerated the general  anesthesia.  Throughout the postoperative day #1 and 2, she was  continued on IV pain medications.  She was afebrile.  Her vital signs  were stable and normal.  She was tolerating a regular diet, getting up  out to the bathroom.  She was felt ready to be discharged on  postoperative day #3.  On the day of discharge, the patient was  afebrile.  Her vital signs were stable and was felt ready to be  discharged to home.   RECOMMENDATIONS AND DISPOSITION:  The patient will be  discharged to  home.  She will be seen back in the office on October 18, 2008.  She is  to continue with the current splint.  No use of the right arm.  She is  to  continue the above discharge medications.  If she has any worsening  fevers, chills, nausea, vomiting, worsening pain, or problems with the  elbow, she is going to come back in.  All questions were addressed prior  to discharge.   CONDITION ON DISCHARGE:  Good.      Madelynn Done, MD  Electronically Signed     FWO/MEDQ  D:  10/06/2008  T:  10/07/2008  Job:  [id number]

## 2011-02-23 NOTE — Op Note (Signed)
NAME:  Teresa York, Teresa York                            ACCOUNT NO.:  [id number]   MEDICAL RECORD NO.:  [id number]                   PATIENT TYPE:  OUT   LOCATION:  MRI                                  FACILITY:  [facility]   PHYSICIAN:  Rose Phi. Maple Hudson, M.D.                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   OPERATION PERFORMED:  1. Blue dye injection.  2. Left partial mastectomy with needle localization and specimen     mammography.  3. Left sentinel lymph node biopsy.   SURGEON:  Rose Phi. Maple Hudson, M.D.   ANESTHESIA:  General.   DESCRIPTION OF PROCEDURE:  Prior to coming to the operating room, a  localization wire was placed in the medial portion of the left breast for  identification purposes for the tumor.  In addition, 1 mCi of technetium  sulfur colloid was injected intradermally.   After suitable general anesthesia was induced, the patient was placed in  supine position with the arm extended on the arm board.  2 mL of methylene blue with 3 mL of injectable saline was injected in the  subareolar tissue and the breast gently massaged for about three minutes.  After prepping and draping the breast and axilla, a curved incision in the  medial portion of the left breast using the wire as a guide was carried out  and a wide excision of the wire and surrounding tissue were done.  Specimen  mammography confirmed the removal of the lesion and this specimen was  submitted to the pathologist for Touch Preps.  There were a couple of other  areas that bothered me and I excised those just as additional tissue to be  sure the margins were clean.  Hemostasis was obtained with cautery.   While that was being done, a transverse left axillary incision was made with  dissection  through the subcutaneous tissue to the clavipectoral fascia.  Just deep to the fascia was a bluish and hot lymph node with counts in  excess of 1000.  That was excised and small lymphatics clipped.  That was  submitted as a sentinel node.  There were no other blue, hot or palpable  nodes.  The sentinel node was also negative, so the incisions were all  closed with 3-0 Vicryl and subcuticular 4-0 Monocryl with Steri-Strips.  Dressings applied.  The patient was then transferred to the recovery room in  satisfactory condition having tolerated the procedure well.  Rose Phi. Maple Hudson, M.D.    PRY/MEDQ  D:  11/01/2003  T:  11/01/2003  Job:  [id number]

## 2011-07-13 LAB — BASIC METABOLIC PANEL
CO2: 27 mEq/L (ref 19–32)
Chloride: 99 mEq/L (ref 96–112)
Creatinine, Ser: 1.11 mg/dL (ref 0.4–1.2)
GFR calc Af Amer: 59 mL/min — ABNORMAL LOW (ref >60)
Glucose, Bld: 98 mg/dL (ref 70–99)

## 2011-07-13 LAB — CBC
Hemoglobin: 12.5 g/dL (ref 12.0–15.0)
MCHC: 32.4 g/dL (ref 30.0–36.0)
MCV: 83.7 fL (ref 78.0–100.0)
RBC: 4.6 MIL/uL (ref 3.87–5.11)
RDW: 14.3 % (ref 11.5–15.5)

## 2011-07-27 ENCOUNTER — Ambulatory Visit
Admission: RE | Admit: 2011-07-27 | Discharge: 2011-07-27 | Disposition: A | Discharge status: Home / Self Care | Payer: Medicare PPO | Source: Ambulatory Visit | Attending: Oncology | Admitting: Oncology

## 2011-07-27 DIAGNOSIS — Z9889 Other specified postprocedural states: Secondary | ICD-10-CM

## 2011-07-27 DIAGNOSIS — Z780 Asymptomatic menopausal state: Secondary | ICD-10-CM

## 2011-07-27 DIAGNOSIS — Z853 Personal history of malignant neoplasm of breast: Secondary | ICD-10-CM

## 2011-09-15 ENCOUNTER — Encounter: Payer: Self-pay | Admitting: Internal Medicine

## 2011-09-21 ENCOUNTER — Encounter: Payer: Self-pay | Admitting: Internal Medicine

## 2011-10-08 ENCOUNTER — Ambulatory Visit (AMBULATORY_SURGERY_CENTER): Payer: Medicare PPO | Admitting: *Deleted

## 2011-10-08 VITALS — Ht 61.5 in | Wt 217.5 lb

## 2011-10-08 DIAGNOSIS — K2270 Barrett's esophagus without dysplasia: Secondary | ICD-10-CM

## 2011-10-16 ENCOUNTER — Encounter: Payer: Self-pay | Admitting: Internal Medicine

## 2011-10-16 ENCOUNTER — Ambulatory Visit (AMBULATORY_SURGERY_CENTER): Payer: Medicare PPO | Admitting: Internal Medicine

## 2011-10-16 VITALS — BP 135/86 | HR 64 | Temp 96.6°F | Resp 18 | Ht 61.5 in | Wt 217.0 lb

## 2011-10-16 DIAGNOSIS — K2270 Barrett's esophagus without dysplasia: Secondary | ICD-10-CM

## 2011-10-16 DIAGNOSIS — K219 Gastro-esophageal reflux disease without esophagitis: Secondary | ICD-10-CM

## 2011-10-16 MED ORDER — SODIUM CHLORIDE 0.9 % IV SOLN: 500.0000 mL | INTRAVENOUS | Status: DC

## 2011-10-16 NOTE — Patient Instructions (Signed)
Continue PPI twice daily.  Refer to blue and green discharge instruction sheets.  When biopsy returns from lab Dr.Perry will mail you a letter confirming how many years until next EGD.  If you do not receive a letter within 3 weeks please call his office [phone].

## 2011-10-16 NOTE — Op Note (Signed)
[facility] [address] [place], [place]  [address]  ENDOSCOPY PROCEDURE REPORT  PATIENT:  Teresa, York  MR#:  [id number] BIRTHDATE:  [birth year], 70 yrs. old  GENDER:  female  ENDOSCOPIST:  Wilhemina Bonito. Eda Keys, MD Referred by:  Surveillance Program Recall,  PROCEDURE DATE:  10/16/2011 PROCEDURE:  EGD with biopsy, 432[address] ASA CLASS:  Class II INDICATIONS:  h/o Barrett's Esophagus ; last exam 09-2010 w/ atypia  MEDICATIONS:   MAC sedation, administered by CRNA, propofol (Diprivan) 230 mg IV TOPICAL ANESTHETIC:  none  DESCRIPTION OF PROCEDURE:   After the risks benefits and alternatives of the procedure were thoroughly explained, informed consent was obtained.  The LB GIF-H180 [id number] endoscope was introduced through the mouth and advanced to the second portion of the duodenum, without limitations.  The instrument was slowly withdrawn as the mucosa was fully examined. <<PROCEDUREIMAGES>>  5-6 cm segment of Barrett's esophagus (24-30) with squamous islands was found in the distal esophagus. 4q bx q 2cm (T=12) taken  The stomach was entered and closely examined. The antrum, angularis, and lesser curvature were well visualized, including a retroflexed view of the cardia and fundus. The stomach wall was normally distensable. The scope passed easily through the pylorus into the duodenum.  The duodenal bulb was normal in appearance, as was the postbulbar duodenum.    Retroflexed views revealed a hiatal hernia.    The scope was then withdrawn from the patient and the procedure completed.  COMPLICATIONS:  None  ENDOSCOPIC IMPRESSION: 1) Barrett's esophagus in the distal 5-6cm of the esophagus 2) Normal stomach 3) Normal duodenum 4) A hiatal hernia 5) GERD RECOMMENDATIONS: 1) Continue PPI twice daily 2) REPEAT SURVEILLANCE EGD IN 3 YEARS - if no dysplasia on bx  ______________________________ Wilhemina Bonito. Eda Keys, MD  CC:  Lindaann Slough, MD;  The Patient  n. eSIGNED:    Wilhemina Bonito. Eda Keys at 10/16/2011 12:32 PM  Carilyn Goodpasture, [phone]

## 2011-10-16 NOTE — Progress Notes (Signed)
Patient did not experience any of the following events: a burn prior to discharge; a fall within the facility; wrong site/side/patient/procedure/implant event; or a hospital transfer or hospital admission upon discharge from the facility. (G8907) Patient did not have preoperative order for IV antibiotic SSI prophylaxis. (G8918)  

## 2011-10-17 ENCOUNTER — Telehealth: Payer: Self-pay | Admitting: *Deleted

## 2011-10-17 NOTE — Telephone Encounter (Signed)

## 2011-10-22 ENCOUNTER — Encounter: Payer: Self-pay | Admitting: Internal Medicine

## 2012-07-15 ENCOUNTER — Other Ambulatory Visit: Payer: Self-pay | Admitting: Oncology

## 2012-07-15 DIAGNOSIS — Z1231 Encounter for screening mammogram for malignant neoplasm of breast: Secondary | ICD-10-CM

## 2012-08-08 ENCOUNTER — Ambulatory Visit
Admission: RE | Admit: 2012-08-08 | Discharge: 2012-08-08 | Disposition: A | Discharge status: Home / Self Care | Payer: Medicare PPO | Source: Ambulatory Visit | Attending: Oncology | Admitting: Oncology

## 2012-08-08 DIAGNOSIS — Z1231 Encounter for screening mammogram for malignant neoplasm of breast: Secondary | ICD-10-CM

## 2013-07-16 ENCOUNTER — Other Ambulatory Visit: Payer: Self-pay

## 2013-07-16 DIAGNOSIS — Z1231 Encounter for screening mammogram for malignant neoplasm of breast: Secondary | ICD-10-CM

## 2013-08-11 ENCOUNTER — Ambulatory Visit: Payer: Medicare PPO

## 2013-08-28 ENCOUNTER — Ambulatory Visit: Payer: Medicare PPO

## 2013-09-16 ENCOUNTER — Ambulatory Visit: Payer: Medicare PPO

## 2013-11-17 ENCOUNTER — Encounter: Payer: Self-pay | Admitting: Cardiovascular Disease

## 2013-11-17 ENCOUNTER — Ambulatory Visit (INDEPENDENT_AMBULATORY_CARE_PROVIDER_SITE_OTHER): Payer: Medicare HMO | Admitting: Cardiovascular Disease

## 2013-11-17 ENCOUNTER — Encounter (INDEPENDENT_AMBULATORY_CARE_PROVIDER_SITE_OTHER): Payer: Self-pay

## 2013-11-17 VITALS — BP 120/72 | HR 67 | Ht 62.0 in | Wt 221.8 lb

## 2013-11-17 DIAGNOSIS — I4891 Unspecified atrial fibrillation: Secondary | ICD-10-CM

## 2013-11-17 LAB — BASIC METABOLIC PANEL
BUN: 24 mg/dL — ABNORMAL HIGH (ref 6–23)
CO2: 28 meq/L (ref 19–32)
Calcium: 9.2 mg/dL (ref 8.4–10.5)
Chloride: 100 mEq/L (ref 96–112)
Creatinine, Ser: 1.2 mg/dL (ref 0.4–1.2)
GFR: 48.03 mL/min — ABNORMAL LOW (ref >60.00)
GLUCOSE: 89 mg/dL (ref 70–99)
POTASSIUM: 4.1 meq/L (ref 3.5–5.1)
SODIUM: 136 meq/L (ref 135–145)

## 2013-11-17 LAB — CBC WITH DIFFERENTIAL/PLATELET
Basophils Absolute: 0 10*3/uL (ref 0.0–0.1)
Basophils Relative: 0.4 % (ref 0.0–3.0)
EOS PCT: 5.8 % — AB (ref 0.0–5.0)
Eosinophils Absolute: 0.6 10*3/uL (ref 0.0–0.7)
HEMATOCRIT: 43.4 % (ref 36.0–46.0)
Hemoglobin: 13.8 g/dL (ref 12.0–15.0)
LYMPHS ABS: 2.8 10*3/uL (ref 0.7–4.0)
Lymphocytes Relative: 27.1 % (ref 12.0–46.0)
MCHC: 31.7 g/dL (ref 30.0–36.0)
MCV: 85.1 fl (ref 78.0–100.0)
MONOS PCT: 5 % (ref 3.0–12.0)
Monocytes Absolute: 0.5 10*3/uL (ref 0.1–1.0)
NEUTROS PCT: 61.7 % (ref 43.0–77.0)
Neutro Abs: 6.3 10*3/uL (ref 1.4–7.7)
PLATELETS: 329 10*3/uL (ref 150.0–400.0)
RBC: 5.1 Mil/uL (ref 3.87–5.11)
RDW: 15.7 % — ABNORMAL HIGH (ref 11.5–14.6)
WBC: 10.1 10*3/uL (ref 4.5–10.5)

## 2013-11-17 LAB — TSH: TSH: 0.92 u[IU]/mL (ref 0.35–5.50)

## 2013-11-17 MED ORDER — METOPROLOL SUCCINATE ER 25 MG PO TB24: 25.0000 mg | ORAL_TABLET | Freq: Every day | ORAL | Status: DC

## 2013-11-17 MED ORDER — RIVAROXABAN 20 MG PO TABS: 20.0000 mg | ORAL_TABLET | Freq: Every day | ORAL | Status: DC

## 2013-11-17 NOTE — Patient Instructions (Signed)
Your physician recommends that you schedule a follow-up appointment in:  About 8 weeks.   Your physician has requested that you have an echocardiogram. Echocardiography is a painless test that uses sound waves to create images of your heart. It provides your doctor with information about the size and shape of your heart and how well your heart's chambers and valves are working. This procedure takes approximately one hour. There are no restrictions for this procedure.   Your physician has recommended you make the following change in your medication: Start Xarelto 20 mg by mouth daily with dinner.  Start Toprol 25 mg by mouth daily.  Stop Aspirin. Stop Naproxen.

## 2013-11-17 NOTE — Progress Notes (Signed)
History of Present Illness: 73 yo female with history of HTN, HLD, OSA, GERD, Barrett's esophagitis and new onset atrial fibrillation who is here today as a new patient for evaluation and further management of her atrial fibrillation. She was seen in her primary care office 10/27/13 and routine EKG demonstrated atrial fibrillation with HR of 141 bpm. She had no awareness of irregularity of her heart. No SOB, palpitations or chest pain.   She has been feeling well. She has no complaints today.   Primary Care Physician: Jesse Fall [facility])  Past Medical History  Diagnosis Date  . Seasonal allergies   . Hypertension   . Hypothyroidism   . Barrett's esophagus   . Depression   . HX: breast cancer 2005  . GERD (gastroesophageal reflux disease)   . Bronchitis     Past Surgical History  Procedure Laterality Date  . Tubal ligation  1980  . Bilateral carpal tunnel release  1995  . Breast lumpectomy  2005    left breast  . Fracture right elbow  2009    with implant  . Back surgery      L5  . Eyelid surgery      Mohs    Current Outpatient Prescriptions  Medication Sig Dispense Refill  . aspirin 81 MG tablet Take 160 mg by mouth daily.        . Cyanocobalamin (B-12 PO) Take by mouth daily.        . Ferrous Sulfate Dried 200 (65 FE) MG TABS Take by mouth daily.        Marland Kitchen FLUoxetine (PROZAC) 40 MG capsule Take 40 mg by mouth daily.       Marland Kitchen gabapentin (NEURONTIN) 300 MG capsule Take 300 mg by mouth 3 (three) times daily.      Marland Kitchen levothyroxine (SYNTHROID, LEVOTHROID) 137 MCG tablet Take 68.5 mcg by mouth daily.       Marland Kitchen lisinopril-hydrochlorothiazide (PRINZIDE,ZESTORETIC) 20-25 MG per tablet Take 1 tablet by mouth daily.       . naproxen (NAPROSYN) 500 MG tablet Take 500 mg by mouth daily.        Marland Kitchen omeprazole (PRILOSEC) 20 MG capsule Take 20 mg by mouth 2 (two) times daily.       . traZODone (DESYREL) 150 MG tablet Take 75 mg by mouth as needed.       Marland Kitchen UNABLE TO FIND  Med Name: I Cap Multi Vit.  Takes 1 cap daily       . VESICARE 10 MG tablet Take 10 mg by mouth daily.      . Vitamin A-Vitamin D-Minerals (SUPER D3 COMPLEX PO) Take by mouth daily.        Marland Kitchen zolpidem (AMBIEN) 10 MG tablet Take 10 mg by mouth at bedtime as needed.         Current Facility-Administered Medications  Medication Dose Route Frequency Provider Last Rate Last Dose  . 0.9 %  sodium chloride infusion  500 mL Intravenous Continuous Irene Shipper, MD        No Known Allergies  History   Social History  . Marital Status: Married    Spouse Name: N/A    Number of Children: 2  . Years of Education: N/A   Occupational History  . Retired    Social History Main Topics  . Smoking status: Never Smoker   . Smokeless tobacco: Never Used  . Alcohol Use: No  . Drug Use: No  .  Sexual Activity: Not on file   Other Topics Concern  . Not on file   Social History Narrative  . No narrative on file    Family History  Problem Relation Age of Onset  . Colon cancer Neg Hx   . Esophageal cancer Neg Hx   . Stomach cancer Neg Hx   . Heart attack Father 95    Review of Systems:  As stated in the HPI and otherwise negative.   BP 120/72  Pulse 67  Ht 5\' 2"  (1.575 m)  Wt 221 lb 12.8 oz (100.608 kg)  BMI 40.56 kg/m2  Physical Examination: General: Well developed, well nourished, NAD HEENT: OP clear, mucus membranes moist SKIN: warm, dry. No rashes. Neuro: No focal deficits Musculoskeletal: Muscle strength 5/5 all ext Psychiatric: Mood and affect normal Neck: No JVD, no carotid bruits, no thyromegaly, no lymphadenopathy. Lungs:Clear bilaterally, no wheezes, rhonci, crackles Cardiovascular: Regular rate and rhythm. No murmurs, gallops or rubs. Abdomen:Soft. Bowel sounds present. Non-tender.  Extremities: No lower extremity edema. Pulses are 2 + in the bilateral DP/PT.  EKG: NSR, rate 67 bpm. Biatrial enlargement.   Assessment and Plan:   1. Atrial fibrillation: She is in  sinus today. Documented atrial fibrillation in primary care 10/27/13. EKG is scanned into chart. I have spent 30 minutes today reviewing the etiology of atrial fibrillation, CVA risk and treatment options. Will start Toprol XL 25 mg po Qdaily. Will start Xarelto 20 mg po Qdaily for anti-coagulation. Will check BMET, CBC and TSH today. Recent adjustment of Synthroid dosage down per primary care. Will arrange echo to assess LV and atrial size, LV function, exclude structural heart disease. (Samples of Xarelto given today).

## 2013-12-09 ENCOUNTER — Encounter: Payer: Self-pay | Admitting: Internal Medicine

## 2013-12-09 ENCOUNTER — Ambulatory Visit (HOSPITAL_COMMUNITY): Payer: Medicare HMO | Attending: Internal Medicine | Admitting: Cardiology

## 2013-12-09 DIAGNOSIS — I4891 Unspecified atrial fibrillation: Secondary | ICD-10-CM

## 2013-12-09 NOTE — Progress Notes (Signed)
Echo performed. 

## 2013-12-14 ENCOUNTER — Telehealth: Payer: Self-pay | Admitting: Cardiovascular Disease

## 2013-12-15 ENCOUNTER — Telehealth: Payer: Self-pay | Admitting: Cardiovascular Disease

## 2013-12-15 NOTE — Telephone Encounter (Addendum)
New message    Need to stop xarleto for 7 days for epidural steriod injection. Need an answer today.    Office stated this is the second message they left on the patient - stated they called on yesterday and spoke with someone.    Gave an apology to [facility] . Route message to nurse.

## 2013-12-15 NOTE — Telephone Encounter (Signed)
I spoke with Sammie and gave her information from Dr. [name]. Will fax this note to [phone]

## 2013-12-15 NOTE — Telephone Encounter (Signed)
Spoke with pt and reviewed echo results with her.

## 2013-12-15 NOTE — Telephone Encounter (Signed)
Chart reviewed and phone note was not routed to me yesterday. Phone note routed to me today indicates pt was returning call for results of echo. Call back person on today's note was listed as Sammie but note indicates pt was calling. I checked with operator prior to making call and confirmed call back person was pt.  Will forward to Dr. [name] regarding request to hold Xarelto.  Direct call back number for Sammie is [phone]

## 2013-12-15 NOTE — Telephone Encounter (Signed)
Patient is returning your call. Please call back.  °

## 2013-12-15 NOTE — Telephone Encounter (Signed)
Left message for Sammie to call back

## 2013-12-15 NOTE — Telephone Encounter (Signed)
OK to hold Xarelto but should be adequate to hold 2 days before procedure given the way this drug works. cdm

## 2014-01-07 ENCOUNTER — Telehealth: Payer: Self-pay | Admitting: Cardiovascular Disease

## 2014-01-07 NOTE — Telephone Encounter (Signed)
New message     Pt is on xarelto---pt has blood in urine.  Please advise

## 2014-01-07 NOTE — Telephone Encounter (Signed)
C/o blood in urine, pt on Xarelto 20 mg daily. No frank blood in toilet water, blood on toilet paper and was in her underwear. Pt has back problems, had spinal injection of steroids last week and held xarelto 2 days. C/o back pain and urinary urgency.  Pt was told to call PCP for an app, I will contact Dr [name] to advise on Xarelto. Per Dr [name], pt to keep app tomorrow with him, see pcp today to assess urine and stay on Xarelto. Pt was called, she has app with pcp this afternoon, will keep app tomorrow and will stay on Xarelto.

## 2014-01-08 ENCOUNTER — Encounter: Payer: Self-pay | Admitting: Cardiovascular Disease

## 2014-01-08 ENCOUNTER — Ambulatory Visit (INDEPENDENT_AMBULATORY_CARE_PROVIDER_SITE_OTHER): Payer: Medicare HMO | Admitting: Cardiovascular Disease

## 2014-01-08 VITALS — BP 119/64 | HR 65 | Ht 62.0 in | Wt 223.0 lb

## 2014-01-08 DIAGNOSIS — I4891 Unspecified atrial fibrillation: Secondary | ICD-10-CM

## 2014-01-08 NOTE — Patient Instructions (Signed)
Your physician wants you to follow-up in:  6 months. You will receive a reminder letter in the mail two months in advance. If you don't receive a letter, please call our office to schedule the follow-up appointment.   

## 2014-01-08 NOTE — Telephone Encounter (Signed)
Pt saw Dr. McAlhany today 

## 2014-01-08 NOTE — Progress Notes (Signed)
History of Present Illness: 73 yo female with history of HTN, HLD, OSA, GERD, Barrett's esophagitis and new onset atrial fibrillation who is here today for cardiac follow up. I saw her as a new patient 11/17/13 for evaluation and further management of her atrial fibrillation. She was seen in her primary care office 10/27/13 and routine EKG demonstrated atrial fibrillation with HR of 141 bpm. She had no awareness of irregularity of her heart. No SOB, palpitations or chest pain. I started Xarelto and Toprol. Echo 12/09/13 with normal LV function, mild to moderate AI, LAE.   She has been feeling well but has had some blood on her underwear. She was seen in primary care and felt to have vaginal abnormality. She is being seen in Gyn next week.   Primary Care Physician: Jesse Fall [facility])  Past Medical History  Diagnosis Date  . Seasonal allergies   . Hypertension   . Hypothyroidism   . Barrett's esophagus   . Depression   . HX: breast cancer 2005  . GERD (gastroesophageal reflux disease)   . Bronchitis     Past Surgical History  Procedure Laterality Date  . Tubal ligation  1980  . Bilateral carpal tunnel release  1995  . Breast lumpectomy  2005    left breast  . Fracture right elbow  2009    with implant  . Back surgery      L5  . Eyelid surgery      Mohs    Current Outpatient Prescriptions  Medication Sig Dispense Refill  . Ferrous Sulfate Dried 200 (65 FE) MG TABS Take by mouth daily.        Marland Kitchen FLUoxetine (PROZAC) 40 MG capsule Take 40 mg by mouth daily.       Marland Kitchen gabapentin (NEURONTIN) 300 MG capsule Take 300 mg by mouth 2 (two) times daily.       Marland Kitchen levothyroxine (SYNTHROID, LEVOTHROID) 137 MCG tablet Take 68.5 mcg by mouth daily.       Marland Kitchen lisinopril-hydrochlorothiazide (PRINZIDE,ZESTORETIC) 20-25 MG per tablet Take 1 tablet by mouth daily.       . metoprolol succinate (TOPROL XL) 25 MG 24 hr tablet Take 1 tablet (25 mg total) by mouth daily.  30 tablet  6  .  omeprazole (PRILOSEC) 20 MG capsule Take 20 mg by mouth 2 (two) times daily.       . Rivaroxaban (XARELTO) 20 MG TABS tablet Take 1 tablet (20 mg total) by mouth daily with supper.  30 tablet  6  . traZODone (DESYREL) 150 MG tablet Take 75 mg by mouth as needed.       . zolpidem (AMBIEN) 10 MG tablet Take 10 mg by mouth at bedtime as needed.         Current Facility-Administered Medications  Medication Dose Route Frequency Provider Last Rate Last Dose  . 0.9 %  sodium chloride infusion  500 mL Intravenous Continuous Irene Shipper, MD        No Known Allergies  History   Social History  . Marital Status: Married    Spouse Name: N/A    Number of Children: 2  . Years of Education: N/A   Occupational History  . Retired    Social History Main Topics  . Smoking status: Never Smoker   . Smokeless tobacco: Never Used  . Alcohol Use: No  . Drug Use: No  . Sexual Activity: Not on file   Other Topics Concern  .  Not on file   Social History Narrative  . No narrative on file    Family History  Problem Relation Age of Onset  . Colon cancer Neg Hx   . Esophageal cancer Neg Hx   . Stomach cancer Neg Hx   . Heart attack Father 95    Review of Systems:  As stated in the HPI and otherwise negative.   BP 119/64  Pulse 65  Ht 5\' 2"  (1.575 m)  Wt 223 lb (101.152 kg)  BMI 40.78 kg/m2  Physical Examination: General: Well developed, well nourished, NAD HEENT: OP clear, mucus membranes moist SKIN: warm, dry. No rashes. Neuro: No focal deficits Musculoskeletal: Muscle strength 5/5 all ext Psychiatric: Mood and affect normal Neck: No JVD, no carotid bruits, no thyromegaly, no lymphadenopathy. Lungs:Clear bilaterally, no wheezes, rhonci, crackles Cardiovascular: Regular rate and rhythm. No murmurs, gallops or rubs. Abdomen:Soft. Bowel sounds present. Non-tender.  Extremities: No lower extremity edema. Pulses are 2 + in the bilateral DP/PT.  EKG: NSR, rate 67 bpm.   Echo  12/09/13: - Left ventricle: The cavity size was normal. There was mild concentric hypertrophy. Systolic function was normal. The estimated ejection fraction was in the range of 55% to 60%. Wall motion was normal; there were no regional wall motion abnormalities. Doppler parameters are consistent with abnormal left ventricular relaxation (grade 1 diastolic dysfunction). The E/e' ratio is >10, suggesting elevated LV filling pressure. - Aortic valve: Trileaflet. Sclerosis without stenosis. Mild to moderate regurgitation. - Mitral valve: Mildly thickened leaflets . Trivial regurgitation. - Left atrium: Severely dilated (48 ml/m2). - Right atrium: The atrium was mildly dilated (20 cm2). - Tricuspid valve: Mild regurgitation. - Inferior vena cava: The vessel was normal in size; the respirophasic diameter changes were in the normal range (= 50%); findings are consistent with normal central venous pressure. - Pericardium, extracardiac: There was no pericardial effusion.   Assessment and Plan:   1. Atrial fibrillation: She is in sinus today. Documented atrial fibrillation in primary care 10/27/13. Continue Toprol and Xarelto. She is having some vaginal bleeding, mild. To see Gynecologist Monday. She can hold her Xarelto if needed for Gyn procedure.

## 2014-05-12 ENCOUNTER — Encounter: Payer: Self-pay | Admitting: Cardiovascular Disease

## 2014-05-12 ENCOUNTER — Telehealth: Payer: Self-pay | Admitting: Cardiovascular Disease

## 2014-05-12 NOTE — Telephone Encounter (Signed)
New message     Pt is having an injection and need clearance to stop her xeralto prior.  Can she stop rx and how many days prior?  Please fax to [phone] attn Tammy

## 2014-05-12 NOTE — Telephone Encounter (Signed)
Teresa York, I have written a letter stating it is ok to hold her Xarelto. We can stamp this or I can sign tomorrow.   Gerald Stabs

## 2014-05-12 NOTE — Telephone Encounter (Signed)
Letter faxed.

## 2014-06-15 ENCOUNTER — Other Ambulatory Visit: Payer: Self-pay | Admitting: Cardiovascular Disease

## 2014-07-05 ENCOUNTER — Encounter: Payer: Self-pay | Admitting: Internal Medicine

## 2014-07-22 ENCOUNTER — Other Ambulatory Visit: Payer: Self-pay | Admitting: Cardiovascular Disease

## 2014-09-04 ENCOUNTER — Other Ambulatory Visit: Payer: Self-pay | Admitting: Cardiovascular Disease

## 2014-09-14 ENCOUNTER — Encounter: Payer: Self-pay | Admitting: *Deleted

## 2014-09-17 ENCOUNTER — Ambulatory Visit (INDEPENDENT_AMBULATORY_CARE_PROVIDER_SITE_OTHER): Payer: Medicare HMO | Admitting: Cardiovascular Disease

## 2014-09-17 ENCOUNTER — Encounter: Payer: Self-pay | Admitting: Cardiovascular Disease

## 2014-09-17 VITALS — BP 110/72 | HR 66 | Ht 62.0 in | Wt 237.0 lb

## 2014-09-17 DIAGNOSIS — I480 Paroxysmal atrial fibrillation: Secondary | ICD-10-CM

## 2014-09-17 MED ORDER — RIVAROXABAN 20 MG PO TABS: ORAL_TABLET | ORAL | Status: DC

## 2014-09-17 NOTE — Progress Notes (Signed)
History of Present Illness: 73 yo female with history of HTN, HLD, OSA, GERD, Barrett's esophagitis and atrial fibrillation who is here today for cardiac follow up. I saw her as a new patient 11/17/13 for evaluation and further management of her atrial fibrillation. She was seen in her primary care office 10/27/13 and routine EKG demonstrated atrial fibrillation with HR of 141 bpm. She had no awareness of irregularity of her heart. No SOB, palpitations or chest pain. I started Xarelto and Toprol. Echo 12/09/13 with normal LV function, mild to moderate AI, LAE.  She is here for follow up. She is feeling well. No chest pain, SOB, near syncope or syncope.  One episode of heart racing 2 weeks ago. Lasted for a few seconds. EKG with sinus today.   Primary Care Physician: Jesse Fall [facility])  Past Medical History  Diagnosis Date  . Seasonal allergies   . Hypertension   . Hypothyroidism   . Barrett's esophagus   . Depression   . HX: breast cancer 2005  . GERD (gastroesophageal reflux disease)   . Bronchitis     Past Surgical History  Procedure Laterality Date  . Tubal ligation  1980  . Bilateral carpal tunnel release  1995  . Breast lumpectomy  2005    left breast  . Fracture right elbow  2009    with implant  . Back surgery      L5  . Eyelid surgery      Mohs    Current Outpatient Prescriptions  Medication Sig Dispense Refill  . Ferrous Sulfate Dried 200 (65 FE) MG TABS Take by mouth daily.      Marland Kitchen FLUoxetine (PROZAC) 40 MG capsule Take 40 mg by mouth daily.     Marland Kitchen HYDROcodone-acetaminophen (NORCO) 10-325 MG per tablet     . levothyroxine (SYNTHROID, LEVOTHROID) 137 MCG tablet Take 68.5 mcg by mouth daily.     Marland Kitchen lisinopril-hydrochlorothiazide (PRINZIDE,ZESTORETIC) 20-25 MG per tablet Take 1 tablet by mouth daily.     . metoprolol succinate (TOPROL-XL) 25 MG 24 hr tablet TAKE ONE TABLET BY MOUTH ONCE DAILY (PATIENT NEEDS TO CALL AND SCHEDULE AN OCTOBER APPOINTMENT)  30 tablet 3  . omeprazole (PRILOSEC) 20 MG capsule Take 20 mg by mouth 2 (two) times daily.     . traZODone (DESYREL) 150 MG tablet Take 75 mg by mouth as needed.     . trimethoprim (TRIMPEX) 100 MG tablet     . XARELTO 20 MG TABS tablet TAKE ONE TABLET BY MOUTH ONCE DAILY WITH SUPPER 30 tablet 0   Current Facility-Administered Medications  Medication Dose Route Frequency Provider Last Rate Last Dose  . 0.9 %  sodium chloride infusion  500 mL Intravenous Continuous Irene Shipper, MD        Allergies  Allergen Reactions  . Benazepril     Cough  . Diltiazem     History   Social History  . Marital Status: Married    Spouse Name: N/A    Number of Children: 2  . Years of Education: N/A   Occupational History  . Retired    Social History Main Topics  . Smoking status: Never Smoker   . Smokeless tobacco: Never Used  . Alcohol Use: No  . Drug Use: No  . Sexual Activity: Not on file   Other Topics Concern  . Not on file   Social History Narrative    Family History  Problem Relation Age of Onset  .  Colon cancer Neg Hx   . Esophageal cancer Neg Hx   . Stomach cancer Neg Hx   . Heart attack Father 95    Review of Systems:  As stated in the HPI and otherwise negative.   BP 110/72 mmHg  Pulse 66  Ht 5\' 2"  (1.575 m)  Wt 237 lb (107.502 kg)  BMI 43.34 kg/m2  SpO2 94%  Physical Examination: General: Well developed, well nourished, NAD HEENT: OP clear, mucus membranes moist SKIN: warm, dry. No rashes. Neuro: No focal deficits Musculoskeletal: Muscle strength 5/5 all ext Psychiatric: Mood and affect normal Neck: No JVD, no carotid bruits, no thyromegaly, no lymphadenopathy. Lungs:Clear bilaterally, no wheezes, rhonci, crackles Cardiovascular: Regular rate and rhythm. No murmurs, gallops or rubs. Abdomen:Soft. Bowel sounds present. Non-tender.  Extremities: No lower extremity edema. Pulses are 2 + in the bilateral DP/PT.  EKG: NSR, rate 66 bpm. LVH  Echo  12/09/13: - Left ventricle: The cavity size was normal. There was mild concentric hypertrophy. Systolic function was normal. The estimated ejection fraction was in the range of 55% to 60%. Wall motion was normal; there were no regional wall motion abnormalities. Doppler parameters are consistent with abnormal left ventricular relaxation (grade 1 diastolic dysfunction). The E/e' ratio is >10, suggesting elevated LV filling pressure. - Aortic valve: Trileaflet. Sclerosis without stenosis. Mild to moderate regurgitation. - Mitral valve: Mildly thickened leaflets . Trivial regurgitation. - Left atrium: Severely dilated (48 ml/m2). - Right atrium: The atrium was mildly dilated (20 cm2). - Tricuspid valve: Mild regurgitation. - Inferior vena cava: The vessel was normal in size; the respirophasic diameter changes were in the normal range (= 50%); findings are consistent with normal central venous pressure. - Pericardium, extracardiac: There was no pericardial effusion.  Assessment and Plan:   1. Atrial fibrillation: She is in sinus today. Documented atrial fibrillation in primary care 10/27/13. Continue Toprol and Xarelto. No bleeding issues.

## 2014-09-17 NOTE — Patient Instructions (Signed)
Your physician wants you to follow-up in:  6 months. You will receive a reminder letter in the mail two months in advance. If you don't receive a letter, please call our office to schedule the follow-up appointment.   

## 2014-11-29 ENCOUNTER — Encounter: Payer: Self-pay | Admitting: Internal Medicine

## 2015-08-24 ENCOUNTER — Telehealth: Payer: Self-pay | Admitting: Cardiovascular Disease

## 2015-08-24 ENCOUNTER — Encounter: Payer: Self-pay | Admitting: Cardiovascular Disease

## 2015-08-24 NOTE — Telephone Encounter (Signed)
Can we fax my letter? Thanks, chris 

## 2015-08-24 NOTE — Telephone Encounter (Signed)
Request for surgical clearance:  1. What type of surgery is being performed? Epidural steroid inject   2. When is this surgery scheduled? 08/31/15   3. Are there any medications that need to be held prior to surgery and how long? xarelto- Rn sending surgical clearance again today to [phone]   4. Name of physician performing surgery? Begovich   5. What is your office phone and fax number? As listed 6.

## 2015-08-25 NOTE — Telephone Encounter (Signed)
I received call back and fax number is [phone].  Will fax Dr. Camillia Herter letter to this number.

## 2015-08-25 NOTE — Telephone Encounter (Signed)
Left message on Teresa York's voicemail to contact our office and give [place] their fax number.

## 2015-09-19 ENCOUNTER — Encounter: Payer: Self-pay | Admitting: Cardiovascular Disease

## 2015-09-19 ENCOUNTER — Ambulatory Visit (INDEPENDENT_AMBULATORY_CARE_PROVIDER_SITE_OTHER): Payer: Medicare HMO | Admitting: Cardiovascular Disease

## 2015-09-19 VITALS — BP 120/70 | HR 58 | Ht 62.0 in | Wt 186.8 lb

## 2015-09-19 DIAGNOSIS — I480 Paroxysmal atrial fibrillation: Secondary | ICD-10-CM

## 2015-09-19 DIAGNOSIS — I351 Nonrheumatic aortic (valve) insufficiency: Secondary | ICD-10-CM | POA: Diagnosis not present

## 2015-09-19 MED ORDER — RIVAROXABAN 20 MG PO TABS: ORAL_TABLET | ORAL | Status: DC

## 2015-09-19 NOTE — Patient Instructions (Signed)
Medication Instructions:  Your physician recommends that you continue on your current medications as directed. Please refer to the Current Medication list given to you today.   Labwork: none  Testing/Procedures: Your physician has requested that you have an echocardiogram. Echocardiography is a painless test that uses sound waves to create images of your heart. It provides your doctor with information about the size and shape of your heart and how well your heart's chambers and valves are working. This procedure takes approximately one hour. There are no restrictions for this procedure.  To be done in 6 months.     Follow-Up: Your physician wants you to follow-up in: 6 months.  You will receive a reminder letter in the mail two months in advance. If you don't receive a letter, please call our office to schedule the follow-up appointment.  Week or so after echo   Any Other Special Instructions Will Be Listed Below (If Applicable).     If you need a refill on your cardiac medications before your next appointment, please call your pharmacy.

## 2015-09-19 NOTE — Progress Notes (Signed)
Chief Complaint  Patient presents with  . Follow-up    atrial fibrillation     History of Present Illness: 74 yo female with history of HTN, HLD, OSA, GERD, Barrett's esophagitis and atrial fibrillation who is here today for cardiac follow up. I saw her as a new patient 11/17/13 for evaluation and further management of her atrial fibrillation. She was seen in her primary care office 10/27/13 and routine EKG demonstrated atrial fibrillation with HR of 141 bpm. She had no awareness of irregularity of her heart. No SOB, palpitations or chest pain. I started Xarelto and Toprol. Echo 12/09/13 with normal LV function, mild to moderate AI, LAE.  She is here for follow up. She is feeling well. No chest pain, SOB, near syncope or syncope. She has had several falls over the last few months but no dizziness or loss of consciousness. No LE edema. She has no bleeding issues on Xarelto.   Primary Care Physician: Jesse Fall [facility])  Past Medical History  Diagnosis Date  . Seasonal allergies   . Hypertension   . Hypothyroidism   . Barrett's esophagus   . Depression   . HX: breast cancer 2005  . GERD (gastroesophageal reflux disease)   . Bronchitis     Past Surgical History  Procedure Laterality Date  . Tubal ligation  1980  . Bilateral carpal tunnel release  1995  . Breast lumpectomy  2005    left breast  . Fracture right elbow  2009    with implant  . Back surgery      L5  . Eyelid surgery      Mohs    Current Outpatient Prescriptions  Medication Sig Dispense Refill  . cetirizine (ZYRTEC) 10 MG tablet Take 10 mg by mouth daily.    Marland Kitchen FLUoxetine (PROZAC) 40 MG capsule Take 40 mg by mouth daily.     Marland Kitchen HYDROcodone-acetaminophen (NORCO) 10-325 MG per tablet Take 1 tablet by mouth every 4 (four) hours as needed for severe pain.     Marland Kitchen levothyroxine (SYNTHROID, LEVOTHROID) 137 MCG tablet Take 68.5 mcg by mouth daily.     . metoprolol succinate (TOPROL-XL) 25 MG 24 hr tablet  TAKE ONE TABLET BY MOUTH ONCE DAILY (PATIENT NEEDS TO CALL AND SCHEDULE AN OCTOBER APPOINTMENT) 30 tablet 3  . Niacin (VITAMIN B-3 PO) Take 1 tablet by mouth daily.    Marland Kitchen omeprazole (PRILOSEC) 20 MG capsule Take 20 mg by mouth 2 (two) times daily.     . rivaroxaban (XARELTO) 20 MG TABS tablet TAKE ONE TABLET BY MOUTH ONCE DAILY WITH SUPPER 30 tablet 11  . traZODone (DESYREL) 150 MG tablet Take 75 mg by mouth as needed.     . trimethoprim (TRIMPEX) 100 MG tablet Take 100 mg by mouth daily.     Marland Kitchen zolpidem (AMBIEN) 5 MG tablet Take 5 mg by mouth at bedtime as needed.     Current Facility-Administered Medications  Medication Dose Route Frequency Provider Last Rate Last Dose  . 0.9 %  sodium chloride infusion  500 mL Intravenous Continuous Irene Shipper, MD        Allergies  Allergen Reactions  . Benazepril Cough  . Diltiazem Other (See Comments)    Pt doesn't remember     Social History   Social History  . Marital Status: Married    Spouse Name: N/A  . Number of Children: 2  . Years of Education: N/A   Occupational History  .  Retired    Social History Main Topics  . Smoking status: Never Smoker   . Smokeless tobacco: Never Used  . Alcohol Use: No  . Drug Use: No  . Sexual Activity: Not on file   Other Topics Concern  . Not on file   Social History Narrative    Family History  Problem Relation Age of Onset  . Colon cancer Neg Hx   . Esophageal cancer Neg Hx   . Stomach cancer Neg Hx   . Heart attack Father 95    Review of Systems:  As stated in the HPI and otherwise negative.   BP 120/70 mmHg  Pulse 58  Ht 5\' 2"  (1.575 m)  Wt 186 lb 12.8 oz (84.732 kg)  BMI 34.16 kg/m2  SpO2 99%  Physical Examination: General: Well developed, well nourished, NAD HEENT: OP clear, mucus membranes moist SKIN: warm, dry. No rashes. Neuro: No focal deficits Musculoskeletal: Muscle strength 5/5 all ext Psychiatric: Mood and affect normal Neck: No JVD, no carotid bruits, no  thyromegaly, no lymphadenopathy. Lungs:Clear bilaterally, no wheezes, rhonci, crackles Cardiovascular: Regular rate and rhythm. Subtle diastolic murmur. No gallops or rubs. Abdomen:Soft. Bowel sounds present. Non-tender.  Extremities: No lower extremity edema. Pulses are 2 + in the bilateral DP/PT.  Echo 12/09/13: - Left ventricle: The cavity size was normal. There was mild concentric hypertrophy. Systolic function was normal. The estimated ejection fraction was in the range of 55% to 60%. Wall motion was normal; there were no regional wall motion abnormalities. Doppler parameters are consistent with abnormal left ventricular relaxation (grade 1 diastolic dysfunction). The E/e' ratio is >10, suggesting elevated LV filling pressure. - Aortic valve: Trileaflet. Sclerosis without stenosis. Mild to moderate regurgitation. - Mitral valve: Mildly thickened leaflets . Trivial regurgitation. - Left atrium: Severely dilated (48 ml/m2). - Right atrium: The atrium was mildly dilated (20 cm2). - Tricuspid valve: Mild regurgitation. - Inferior vena cava: The vessel was normal in size; the respirophasic diameter changes were in the normal range (= 50%); findings are consistent with normal central venous pressure. - Pericardium, extracardiac: There was no pericardial effusion.  EKG:  EKG is ordered today. The ekg ordered today demonstrates Sinus brady, rate 58 bpm.   Recent Labs: No results found for requested labs within last 365 days.   Lipid Panel No results found for: CHOL, TRIG, HDL, CHOLHDL, VLDL, LDLCALC, LDLDIRECT   Wt Readings from Last 3 Encounters:  09/19/15 186 lb 12.8 oz (84.732 kg)  09/17/14 237 lb (107.502 kg)  01/08/14 223 lb (101.152 kg)     Other studies Reviewed: Additional studies/ records that were reviewed today include: . Review of the above records demonstrates:     Assessment and Plan:   1. Atrial fibrillation: She is in sinus today. Continue Toprol for  rate control and Xarelto for anti-coagulation. No bleeding issues.   2. Aortic valve insufficiency: Mild by echo 2015. Will repeat echo spring 2017.   Current medicines are reviewed at length with the patient today.  The patient does not have concerns regarding medicines.  The following changes have been made:  no change  Labs/ tests ordered today include:   Orders Placed This Encounter  Procedures  . EKG 12-Lead  . Echocardiogram     Disposition:   FU with me in 6 months   Signed, Lauree Chandler, MD 09/19/2015 6:28 PM    [address], [place], [place]  [address] Phone: [phone]; Fax: [phone]

## 2015-10-04 ENCOUNTER — Ambulatory Visit: Payer: Medicare HMO | Admitting: Internal Medicine

## 2015-10-27 ENCOUNTER — Encounter: Payer: Self-pay | Admitting: Gastroenterology

## 2015-10-27 ENCOUNTER — Other Ambulatory Visit: Payer: Self-pay | Admitting: Emergency Medicine

## 2015-10-27 ENCOUNTER — Ambulatory Visit (INDEPENDENT_AMBULATORY_CARE_PROVIDER_SITE_OTHER): Payer: Medicare HMO | Admitting: Gastroenterology

## 2015-10-27 ENCOUNTER — Telehealth: Payer: Self-pay | Admitting: Emergency Medicine

## 2015-10-27 VITALS — BP 118/70 | HR 64 | Ht 62.0 in | Wt 184.4 lb

## 2015-10-27 DIAGNOSIS — R1310 Dysphagia, unspecified: Secondary | ICD-10-CM

## 2015-10-27 DIAGNOSIS — K2270 Barrett's esophagus without dysplasia: Secondary | ICD-10-CM

## 2015-10-27 DIAGNOSIS — Z7901 Long term (current) use of anticoagulants: Secondary | ICD-10-CM | POA: Diagnosis not present

## 2015-10-27 DIAGNOSIS — R634 Abnormal weight loss: Secondary | ICD-10-CM

## 2015-10-27 DIAGNOSIS — K22719 Barrett's esophagus with dysplasia, unspecified: Secondary | ICD-10-CM

## 2015-10-27 NOTE — Telephone Encounter (Signed)
Her Xarelto can be held 3 days before her planned surgical procedure. Darlina Guys

## 2015-10-27 NOTE — Progress Notes (Signed)
Agree with assessment and plans. Picture concerning for esophageal carcinoma. She did receive recall letter 1 year ago for follow-up, but did not. In any event, will need endoscopy and biopsies off anticoagulation.

## 2015-10-27 NOTE — Telephone Encounter (Signed)
Thank you so much. Patient informed and verbalized understanding.

## 2015-10-27 NOTE — Patient Instructions (Addendum)
You have been scheduled for an endoscopy. Please follow written instructions given to you at your visit today. If you use inhalers (even only as needed), please bring them with you on the day of your procedure. Your physician has requested that you go to www.startemmi.com and enter the access code given to you at your visit today. This web site gives a general overview about your procedure. However, you should still follow specific instructions given to you by our office regarding your preparation for the procedure.  You will be contaced by our office prior to your procedure for directions on holding your Xarelto.  If you do not hear from our office 1 week prior to your scheduled procedure, please call [phone] to discuss.

## 2015-10-27 NOTE — Progress Notes (Signed)
10/27/2015 Teresa York [id number] 1941/07/19   HISTORY OF PRESENT ILLNESS:  This is a pleasant 75 year old female who is known to Dr. Henrene Pastor for treatment of her GERD and Barrett's esophagus. Her last EGD was in January 2013 at which time she was found have the Barrett's without dysplasia and a hiatal hernia. Repeat EGD was recommended in 3 years. The patient states that time time just got away from her and she thought that it was this year that she was due for her procedure. Anyway, she presents to our office today with complaints of dysphagia. She tells me that she's been having problems swallowing solid food like chicken and bread products for the past 6-8 months. She says that they do not get stuck in her chest (she seems to have a problem initiating getting those food products to go down) and she says that he throat feels very dry/somewhat sore. She says it is occurring every time that she eats. She denies any choking or any issues swallowing liquids. She says that this has resulted in decreased by mouth intake and she has lost 50 pounds since March. She says that she is happy with the weight loss, but concerned about the dysphagia. She is still on her omeprazole 20 mg BID and her reflux seems to be controlled overall with that.  CBC, CMP, and TSH in 07/2015 and 08/2015 unremarkable from GI standpoint.  She is on Xarelto daily for atrial fibrillation that is prescribed by Dr. [name].  She saw him recently and it seems that things are going well. She tells me that she has held Xarelto in the past for other procedures.  Her last colonoscopy was in August 2008 at which time the study was normal (had diverticulosis) and she was placed in a 10 year recall. She denies absolutely any lower GI complaints. She says that she moves her bowels well and denies any rectal bleeding.   Past Medical History  Diagnosis Date  . Seasonal allergies   . Hypertension   . Hypothyroidism   . Barrett's esophagus    . Depression   . HX: breast cancer 2005  . GERD (gastroesophageal reflux disease)   . Bronchitis   . Hiatal hernia   . Diverticulosis    Past Surgical History  Procedure Laterality Date  . Tubal ligation  1980  . Bilateral carpal tunnel release  1995  . Breast lumpectomy  2005    left breast  . Fracture right elbow  2009    with implant  . Back surgery      L5  . Eyelid surgery      Mohs    reports that she has never smoked. She has never used smokeless tobacco. She reports that she does not drink alcohol or use illicit drugs. family history includes Heart attack (age of onset: 47) in her father. There is no history of Colon cancer, Esophageal cancer, or Stomach cancer. Allergies  Allergen Reactions  . Benazepril Cough  . Diltiazem Other (See Comments)    Pt doesn't remember       Outpatient Encounter Prescriptions as of 10/27/2015  Medication Sig  . cetirizine (ZYRTEC) 10 MG tablet Take 10 mg by mouth daily. Reported on 10/27/2015  . FLUoxetine (PROZAC) 40 MG capsule Take 40 mg by mouth daily.   Marland Kitchen HYDROcodone-acetaminophen (NORCO) 10-325 MG per tablet Take 1 tablet by mouth every 4 (four) hours as needed for severe pain.   Marland Kitchen levothyroxine (SYNTHROID, LEVOTHROID) 137 MCG tablet  Take 68.5 mcg by mouth daily.   . metoprolol succinate (TOPROL-XL) 25 MG 24 hr tablet TAKE ONE TABLET BY MOUTH ONCE DAILY (PATIENT NEEDS TO CALL AND SCHEDULE AN OCTOBER APPOINTMENT)  . Niacin (VITAMIN B-3 PO) Take 1 tablet by mouth daily.  Marland Kitchen omeprazole (PRILOSEC) 20 MG capsule Take 20 mg by mouth 2 (two) times daily.   . rivaroxaban (XARELTO) 20 MG TABS tablet TAKE ONE TABLET BY MOUTH ONCE DAILY WITH SUPPER  . traZODone (DESYREL) 150 MG tablet Take 75 mg by mouth as needed.   . trimethoprim (TRIMPEX) 100 MG tablet Take 100 mg by mouth daily.   Marland Kitchen zolpidem (AMBIEN) 5 MG tablet Take 5 mg by mouth at bedtime as needed.  . [DISCONTINUED] 0.9 %  sodium chloride infusion    No facility-administered  encounter medications on file as of 10/27/2015.     REVIEW OF SYSTEMS  : All other systems reviewed and negative except where noted in the History of Present Illness.   PHYSICAL EXAM: BP 118/70 mmHg  Pulse 64  Ht 5\' 2"  (1.575 m)  Wt 184 lb 6.4 oz (83.643 kg)  BMI 33.72 kg/m2 General: Well developed white female in no acute distress Head: Normocephalic and atraumatic Eyes:  Sclerae anicteric, conjunctiva pink. Ears: Normal auditory acuity Lungs: Clear throughout to auscultation Heart: Regular rate and rhythm Abdomen: Soft, non-distended.  Normal bowel sounds.  Non-tender. Musculoskeletal: Symmetrical with no gross deformities  Skin: No lesions on visible extremities Extremities: No edema  Neurological: Alert oriented x 4, grossly non-focal Psychological:  Alert and cooperative. Normal mood and affect  ASSESSMENT AND PLAN: -75 year old female with history of Barrett's esophagus who was due for surveillance EGD one year ago. Now presents to our office with complaints of dysphagia therefore decreased PO ntake and 50 pound weight loss in the past 10 months. We will start by scheduling her for EGD with Dr. Henrene Pastor. She has absolutely no other GI complaints, particularly any bowel symptoms. If EGD looks okay and weight loss continues then would consider CT scan of the abdomen and pelvis +/-repeating colonoscopy.  Continue omeprazole 20 mg BID for now. -Chronic anticoagulation with Xarelto for atrial fibrillation:  She follows with Dr. [name] and has seen him recently. She says that she has held this for other procedures in the past.  Will hold Xarelto for 1 day prior to endoscopic procedures - will instruct when and how to resume after procedure. Benefits and risks of procedure explained including risks of bleeding, perforation, infection, missed lesions, reactions to medications and possible need for hospitalization and surgery for complications. Additional rare but real risk of stroke or other  vascular clotting events off Xarelto also explained and need to seek urgent help if any signs of these problems occur. Will communicate by phone or EMR with patient's  prescribing provider, Dr. [name], to confirm that holding Xarelto is reasonable in this case.    CC:  Maylon Peppers, MD

## 2015-10-27 NOTE — Telephone Encounter (Signed)
10/27/2015   RE: Teresa York DOB: 08-Nov-1940 MRN: [id number]   Dear [name],    We have scheduled the above patient for an endoscopic procedure. Our records show that she is on anticoagulation therapy.   Please advise as to how long the patient may come off her therapy of Xarelto prior to the procedure, which is scheduled for 11-03-2015.  Please fax back/ or route the completed form to [facility].   Sincerely,    [name], [place]

## 2015-11-03 ENCOUNTER — Encounter: Payer: Self-pay | Admitting: Internal Medicine

## 2015-11-03 ENCOUNTER — Ambulatory Visit (AMBULATORY_SURGERY_CENTER): Payer: Medicare HMO | Admitting: Internal Medicine

## 2015-11-03 VITALS — BP 113/57 | HR 58 | Temp 95.8°F | Resp 22 | Ht 62.0 in | Wt 184.0 lb

## 2015-11-03 DIAGNOSIS — K2270 Barrett's esophagus without dysplasia: Secondary | ICD-10-CM

## 2015-11-03 DIAGNOSIS — Z7901 Long term (current) use of anticoagulants: Secondary | ICD-10-CM

## 2015-11-03 DIAGNOSIS — R1310 Dysphagia, unspecified: Secondary | ICD-10-CM

## 2015-11-03 DIAGNOSIS — K222 Esophageal obstruction: Secondary | ICD-10-CM | POA: Diagnosis not present

## 2015-11-03 DIAGNOSIS — R634 Abnormal weight loss: Secondary | ICD-10-CM

## 2015-11-03 DIAGNOSIS — K22710 Barrett's esophagus with low grade dysplasia: Secondary | ICD-10-CM | POA: Diagnosis not present

## 2015-11-03 MED ORDER — SODIUM CHLORIDE 0.9 % IV SOLN: 500.0000 mL | INTRAVENOUS | Status: DC

## 2015-11-03 NOTE — Patient Instructions (Signed)
YOU HAD AN ENDOSCOPIC PROCEDURE TODAY AT [facility]:   Refer to the procedure report that was given to you for any specific questions about what was found during the examination.  If the procedure report does not answer your questions, please call your gastroenterologist to clarify.  If you requested that your care partner not be given the details of your procedure findings, then the procedure report has been included in a sealed envelope for you to review at your convenience later.  YOU SHOULD EXPECT: Some feelings of bloating in the abdomen. Passage of more gas than usual.  Walking can help get rid of the air that was put into your GI tract during the procedure and reduce the bloating. If you had a lower endoscopy (such as a colonoscopy or flexible sigmoidoscopy) you may notice spotting of blood in your stool or on the toilet paper. If you underwent a bowel prep for your procedure, you may not have a normal bowel movement for a few days.  Please Note:  You might notice some irritation and congestion in your nose or some drainage.  This is from the oxygen used during your procedure.  There is no need for concern and it should clear up in a day or so.  SYMPTOMS TO REPORT IMMEDIATELY:     Following upper endoscopy (EGD)  Vomiting of blood or coffee ground material  New chest pain or pain under the shoulder blades  Painful or persistently difficult swallowing  New shortness of breath  Fever of 100F or higher  Black, tarry-looking stools  For urgent or emergent issues, a gastroenterologist can be reached at any hour by calling [phone].   DIET: Your first meal following the procedure should be a small meal and then it is ok to progress to your normal diet. Heavy or fried foods are harder to digest and may make you feel nauseous or bloated.  Likewise, meals heavy in dairy and vegetables can increase bloating.  Drink plenty of fluids but you should avoid alcoholic beverages  for 24 hours.  ACTIVITY:  You should plan to take it easy for the rest of today and you should NOT DRIVE or use heavy machinery until tomorrow (because of the sedation medicines used during the test).    FOLLOW UP: Our staff will call the number listed on your records the next business day following your procedure to check on you and address any questions or concerns that you may have regarding the information given to you following your procedure. If we do not reach you, we will leave a message.  However, if you are feeling well and you are not experiencing any problems, there is no need to return our call.  We will assume that you have returned to your regular daily activities without incident.  If any biopsies were taken you will be contacted by phone or by letter within the next 1-3 weeks.  Please call us at [phone] if you have not heard about the biopsies in 3 weeks.    SIGNATURES/CONFIDENTIALITY: You and/or your care partner have signed paperwork which will be entered into your electronic medical record.  These signatures attest to the fact that that the information above on your After Visit Summary has been reviewed and is understood.  Full responsibility of the confidentiality of this discharge information lies with you and/or your care-partner.  After dilation diet given with instructions. Barrett's esophagus information given. Stricture information give.

## 2015-11-03 NOTE — Progress Notes (Signed)
Called to room to assist during endoscopic procedure.  Patient ID and intended procedure confirmed with present staff. Received instructions for my participation in the procedure from the performing physician.  

## 2015-11-03 NOTE — Progress Notes (Signed)
Dental advisory given to patient 

## 2015-11-03 NOTE — Progress Notes (Signed)
A/ox3 pleased with MAC, report to Jane RN 

## 2015-11-03 NOTE — Op Note (Signed)
[facility]  [address] [place], [id number]   ENDOSCOPY PROCEDURE REPORT  PATIENT: Teresa, York  MR#: [id number] BIRTHDATE: 05-28-1941 , 74  yrs. old GENDER: female ENDOSCOPIST: Eustace Quail, MD REFERRED BY:  .  Self / Office PROCEDURE DATE:  11/03/2015 PROCEDURE:  EGD w/ biopsy and EGD w/ balloon dilation ASA CLASS:     Class III INDICATIONS:  dysphagia and weight loss.  history of Barrett's esophagus. Last EGD January 2013. Overdue for follow-up surveillance MEDICATIONS: Monitored anesthesia care and Propofol 200 mg IV TOPICAL ANESTHETIC: none DESCRIPTION OF PROCEDURE: After the risks benefits and alternatives of the procedure were thoroughly explained, informed consent was obtained.  The [id number] endoscope was introduced through the mouth and advanced to the second portion of the duodenum , Without limitations.  The instrument was slowly withdrawn as the mucosa was fully examined.  EXAM:The esophagus revealed Barrett's changes with multiple intervening squamous islands in the distal esophagus (C6, M6). Approximately 3 cm above the GE junction was soft nodule measuring approximately 1 cm.  This was biopsied multiple times.  As well, surveillance biopsies were taken in multiple areas, 4 quadrants 1-2 cm.  There was a 15 mm stricture at the gastroesophageal junction. Stomach was normal except for large (8 cm) hiatal hernia.  The duodenum was normal.  Retroflexed views revealed a hiatal hernia. A TTS sequential balloon was used to dilate the distal stricture at diameters of 18 and 19 mm. Tolerated well.The scope was then withdrawn from the patient and the procedure completed.  COMPLICATIONS: There were no immediate complications.  ENDOSCOPIC IMPRESSION: 1. Barrett's esophagus status post biopsies 2. Esophageal nodule status post biopsies 3. Esophageal stricture status post dilation 4. Weight loss. Etiology to be  determined  RECOMMENDATIONS: 1.  Await pathology results. Dr. Blanch Media office will contact you with results and recommendations 2.  Continue current medications for acid reflux 3. Restart your Blood thinner today  REPEAT EXAM:  eSigned:  Eustace Quail, MD 11/03/2015 10:56 AM    CC:The Patient and Stevenson Clinch, MD

## 2015-11-04 ENCOUNTER — Telehealth: Payer: Self-pay

## 2015-11-04 NOTE — Telephone Encounter (Signed)
  Follow up Call-  Call back number 11/03/2015  Post procedure Call Back phone  # [phone]  Permission to leave phone message Yes     Patient questions:  Do you have a fever, pain , or abdominal swelling? No. Pain Score  0 *  Have you tolerated food without any problems? Yes.    Have you been able to return to your normal activities? Yes.    Do you have any questions about your discharge instructions: Diet   No. Medications  No. Follow up visit  No.  Do you have questions or concerns about your Care? No.  Actions: * If pain score is 4 or above: No action needed, pain <4.

## 2015-11-21 ENCOUNTER — Encounter: Payer: Self-pay | Admitting: Internal Medicine

## 2015-11-21 ENCOUNTER — Telehealth: Payer: Self-pay

## 2015-11-21 NOTE — Telephone Encounter (Signed)
-----   Message from Irene Shipper, MD sent at 11/18/2015 12:54 PM EST ----- Regarding: Results. Referral Vaughan Basta, This patient has Barrett's esophagus with low-grade dysplasia. I am trying to reach her with these results. I would like her to see Dr. Nash Dimmer. [facility] gastro[facility]. Please arrange an appointment for her with him "Barrett's esophagus with nodular low-grade dysplasia for ablation". ASAP. Thanks Dr. Henrene Pastor

## 2015-11-21 NOTE — Telephone Encounter (Signed)
Paperwork faxed to Dr. Samuel Jester office for appt. Will call back and check on appt.

## 2015-11-22 ENCOUNTER — Encounter: Payer: Self-pay | Admitting: Internal Medicine

## 2015-11-22 ENCOUNTER — Telehealth: Payer: Self-pay | Admitting: Internal Medicine

## 2015-11-22 NOTE — Telephone Encounter (Signed)
Reviewed biopsies with the patient. She understands that we would like to refer her to [facility] to see Dr. Adria Devon to address Barrett's with dysplastic nodule

## 2015-11-23 NOTE — Telephone Encounter (Signed)
Referral has been made to [facility] for pt to see Dr. Adria Devon.

## 2015-12-02 NOTE — Telephone Encounter (Signed)
Pt scheduled to see Dr. Adria Devon 01/09/16@12 :15pm.

## 2015-12-20 ENCOUNTER — Encounter: Payer: Self-pay | Admitting: Internal Medicine

## 2016-03-08 ENCOUNTER — Telehealth: Payer: Self-pay

## 2016-03-08 ENCOUNTER — Ambulatory Visit (HOSPITAL_COMMUNITY): Payer: Medicare HMO | Attending: Cardiology

## 2016-03-08 ENCOUNTER — Other Ambulatory Visit: Payer: Self-pay

## 2016-03-08 DIAGNOSIS — I119 Hypertensive heart disease without heart failure: Secondary | ICD-10-CM | POA: Insufficient documentation

## 2016-03-08 DIAGNOSIS — I359 Nonrheumatic aortic valve disorder, unspecified: Secondary | ICD-10-CM | POA: Diagnosis present

## 2016-03-08 DIAGNOSIS — I340 Nonrheumatic mitral (valve) insufficiency: Secondary | ICD-10-CM | POA: Diagnosis not present

## 2016-03-08 DIAGNOSIS — I351 Nonrheumatic aortic (valve) insufficiency: Secondary | ICD-10-CM | POA: Diagnosis not present

## 2016-03-08 NOTE — Telephone Encounter (Signed)
Burnell Blanks, MD at 09/19/2015 9:23 AM  rivaroxaban (XARELTO) 20 MG TABS tabletTAKE ONE TABLET BY MOUTH ONCE DAILY WITH SUPPER Current medicines are reviewed at length with the patient today. The patient does not have concerns regarding medicines.  The following changes have been made: no change  Sample Xarelto given

## 2016-03-13 ENCOUNTER — Encounter: Payer: Medicare HMO | Admitting: Cardiovascular Disease

## 2016-03-13 NOTE — Progress Notes (Signed)
cancel

## 2016-05-31 ENCOUNTER — Ambulatory Visit: Payer: Medicare HMO | Admitting: Cardiovascular Disease

## 2016-06-22 ENCOUNTER — Encounter (INDEPENDENT_AMBULATORY_CARE_PROVIDER_SITE_OTHER): Payer: Self-pay

## 2016-06-22 ENCOUNTER — Ambulatory Visit (INDEPENDENT_AMBULATORY_CARE_PROVIDER_SITE_OTHER): Payer: Medicare HMO | Admitting: Cardiovascular Disease

## 2016-06-22 VITALS — BP 118/70 | HR 60 | Ht 62.0 in | Wt 184.8 lb

## 2016-06-22 DIAGNOSIS — I351 Nonrheumatic aortic (valve) insufficiency: Secondary | ICD-10-CM

## 2016-06-22 DIAGNOSIS — I480 Paroxysmal atrial fibrillation: Secondary | ICD-10-CM | POA: Diagnosis not present

## 2016-06-22 MED ORDER — METOPROLOL SUCCINATE ER 25 MG PO TB24: ORAL_TABLET | ORAL | 11 refills | Status: DC

## 2016-06-22 MED ORDER — RIVAROXABAN 20 MG PO TABS: ORAL_TABLET | ORAL | 11 refills | Status: DC

## 2016-06-22 NOTE — Patient Instructions (Signed)
Medication Instructions:  Your physician recommends that you continue on your current medications as directed. Please refer to the Current Medication list given to you today.   Labwork: none  Testing/Procedures: Your physician has requested that you have an echocardiogram. Echocardiography is a painless test that uses sound waves to create images of your heart. It provides your doctor with information about the size and shape of your heart and how well your heart's chambers and valves are working. This procedure takes approximately one hour. There are no restrictions for this procedure. To be done in June 2018    Follow-Up: Your physician wants you to follow-up in: 12 months.  You will receive a reminder letter in the mail two months in advance. If you don't receive a letter, please call our office to schedule the follow-up appointment.   Any Other Special Instructions Will Be Listed Below (If Applicable).     If you need a refill on your cardiac medications before your next appointment, please call your pharmacy.

## 2016-06-22 NOTE — Progress Notes (Signed)
Chief Complaint  Patient presents with  . Atrial Fibrillation     History of Present Illness: 75 yo female with history of HTN, HLD, OSA, GERD, Barrett's esophagitis and atrial fibrillation who is here today for cardiac follow up. I saw her as a new patient 11/17/13 for evaluation and further management of her atrial fibrillation. She was seen in her primary care office 10/27/13 and routine EKG demonstrated atrial fibrillation with HR of 141 bpm. She had no awareness of irregularity of her heart. No SOB, palpitations or chest pain. I started Xarelto and Toprol. Echo 12/09/13 with normal LV function, mild to moderate AI, LAE.  She is here for follow up. She is feeling well. No chest pain, SOB, near syncope or syncope. No LE edema. She has no bleeding issues on Xarelto. She fell last week and has a broken foot in her ankle. This was a mechanical fall in her house with no dizziness.   Primary Care Physician: Maylon Peppers, MD   Past Medical History:  Diagnosis Date  . Barrett's esophagus   . Bronchitis   . Depression   . Diverticulosis   . GERD (gastroesophageal reflux disease)   . Hiatal hernia   . HX: breast cancer 2005  . Hypertension   . Hypothyroidism   . Seasonal allergies     Past Surgical History:  Procedure Laterality Date  . BACK SURGERY     L5  . bilateral carpal tunnel release  1995  . BREAST LUMPECTOMY  2005   left breast  . eyelid surgery     Mohs  . fracture right elbow  2009   with implant  . TUBAL LIGATION  1980    Current Outpatient Prescriptions  Medication Sig Dispense Refill  . cetirizine (ZYRTEC) 10 MG tablet Take 10 mg by mouth daily. Reported on 10/27/2015    . Cholecalciferol (D 1000) 1000 units capsule Take 1,000 Units by mouth daily. Reported on 11/03/2015    . ferrous sulfate (SM IRON) 325 (65 FE) MG tablet Take 325 mg by mouth.    Marland Kitchen FLUoxetine (PROZAC) 40 MG capsule Take 40 mg by mouth daily.     Marland Kitchen levothyroxine (SYNTHROID, LEVOTHROID) 137 MCG  tablet Take 205.5 mcg by mouth daily. Take 1 & 1/2 tablets by mouth daily    . metoprolol succinate (TOPROL-XL) 25 MG 24 hr tablet TAKE ONE TABLET BY MOUTH ONCE DAILY 30 tablet 11  . omeprazole (PRILOSEC) 40 MG capsule Take 40 mg by mouth 2 (two) times daily.    . rivaroxaban (XARELTO) 20 MG TABS tablet TAKE ONE TABLET BY MOUTH ONCE DAILY WITH SUPPER 30 tablet 11  . traZODone (DESYREL) 150 MG tablet Take 75 mg by mouth as needed for sleep.     Marland Kitchen trimethoprim (TRIMPEX) 100 MG tablet Take 100 mg by mouth daily.     Marland Kitchen zolpidem (AMBIEN) 5 MG tablet Take 5 mg by mouth at bedtime as needed for sleep.      No current facility-administered medications for this visit.     Allergies  Allergen Reactions  . Benazepril Cough  . Diltiazem Other (See Comments)    Pt doesn't remember   . Diltiazem Hcl Rash    Social History   Social History  . Marital status: Married    Spouse name: N/A  . Number of children: 2  . Years of education: N/A   Occupational History  . Retired Retired   Social History Main Topics  . Smoking status:  Never Smoker  . Smokeless tobacco: Never Used  . Alcohol use No  . Drug use: No  . Sexual activity: Not on file   Other Topics Concern  . Not on file   Social History Narrative  . No narrative on file    Family History  Problem Relation Age of Onset  . Colon cancer Neg Hx   . Esophageal cancer Neg Hx   . Stomach cancer Neg Hx   . Heart attack Father 95    Review of Systems:  As stated in the HPI and otherwise negative.   BP 118/70   Pulse 60   Ht 5\' 2"  (1.575 m)   Wt 184 lb 12.8 oz (83.8 kg)   BMI 33.80 kg/m   Physical Examination: General: Well developed, well nourished, NAD  HEENT: OP clear, mucus membranes moist  SKIN: warm, dry. No rashes. Neuro: No focal deficits  Musculoskeletal: Muscle strength 5/5 all ext  Psychiatric: Mood and affect normal  Neck: No JVD, no carotid bruits, no thyromegaly, no lymphadenopathy.  Lungs:Clear  bilaterally, no wheezes, rhonci, crackles Cardiovascular: Regular rate and rhythm. Subtle diastolic murmur. No gallops or rubs. Abdomen:Soft. Bowel sounds present. Non-tender.  Extremities: No lower extremity edema. Pulses are 2 + in the bilateral DP/PT.  Echo 03/08/16: Left ventricle: The cavity size was normal. There was mild   concentric hypertrophy. Systolic function was normal. The   estimated ejection fraction was in the range of 55% to 60%. Wall   motion was normal; there were no regional wall motion   abnormalities. Features are consistent with a pseudonormal left   ventricular filling pattern, with concomitant abnormal relaxation   and increased filling pressure (grade 2 diastolic dysfunction). - Aortic valve: Moderate diffuse calcification, consistent with   sclerosis. There was moderate regurgitation. Regurgitation   pressure half-time: 321 ms. - Mitral valve: Calcified annulus. There was mild regurgitation. - Left atrium: The atrium was moderately dilated.  EKG:  EKG is not ordered today. The ekg ordered today demonstrates   Recent Labs: No results found for requested labs within last 8760 hours.   Lipid Panel No results found for: CHOL, TRIG, HDL, CHOLHDL, VLDL, LDLCALC, LDLDIRECT   Wt Readings from Last 3 Encounters:  06/22/16 184 lb 12.8 oz (83.8 kg)  11/03/15 184 lb (83.5 kg)  10/27/15 184 lb 6.4 oz (83.6 kg)     Other studies Reviewed: Additional studies/ records that were reviewed today include: . Review of the above records demonstrates:    Assessment and Plan:   1. Atrial fibrillation: She is in sinus today. Continue Toprol for rate control and Xarelto for anti-coagulation. No bleeding issues.   2. Aortic valve insufficiency: Moderate by echo June 2017. Will repeat echo June 2018.    Current medicines are reviewed at length with the patient today.  The patient does not have concerns regarding medicines.  The following changes have been made:  no  change  Labs/ tests ordered today include:   Orders Placed This Encounter  Procedures  . ECHOCARDIOGRAM COMPLETE     Disposition:   FU with me in 12  months   Signed, Lauree Chandler, MD 06/22/2016 12:33 PM    [address], [place], [place]  [address] Phone: [phone]; Fax: [phone]

## 2016-10-31 ENCOUNTER — Inpatient Hospital Stay (HOSPITAL_COMMUNITY)
Admission: AD | Admit: 2016-10-31 | Discharge: 2016-11-02 | DRG: 308 | Disposition: A | Discharge status: Home / Self Care | Payer: MEDICARE | Source: Ambulatory Visit | Attending: Cardiology | Admitting: Cardiology

## 2016-10-31 ENCOUNTER — Ambulatory Visit (INDEPENDENT_AMBULATORY_CARE_PROVIDER_SITE_OTHER): Payer: Medicare HMO | Admitting: Physician Assistant

## 2016-10-31 ENCOUNTER — Encounter: Payer: Self-pay | Admitting: Physician Assistant

## 2016-10-31 ENCOUNTER — Encounter (HOSPITAL_COMMUNITY): Payer: Self-pay | Admitting: General Practice

## 2016-10-31 VITALS — BP 122/80 | HR 136 | Ht 62.0 in | Wt 197.4 lb

## 2016-10-31 DIAGNOSIS — I080 Rheumatic disorders of both mitral and aortic valves: Secondary | ICD-10-CM | POA: Diagnosis not present

## 2016-10-31 DIAGNOSIS — G4733 Obstructive sleep apnea (adult) (pediatric): Secondary | ICD-10-CM | POA: Diagnosis present

## 2016-10-31 DIAGNOSIS — E876 Hypokalemia: Secondary | ICD-10-CM | POA: Diagnosis not present

## 2016-10-31 DIAGNOSIS — I5041 Acute combined systolic (congestive) and diastolic (congestive) heart failure: Secondary | ICD-10-CM | POA: Diagnosis not present

## 2016-10-31 DIAGNOSIS — K2270 Barrett's esophagus without dysplasia: Secondary | ICD-10-CM | POA: Diagnosis not present

## 2016-10-31 DIAGNOSIS — Z853 Personal history of malignant neoplasm of breast: Secondary | ICD-10-CM

## 2016-10-31 DIAGNOSIS — I509 Heart failure, unspecified: Secondary | ICD-10-CM

## 2016-10-31 DIAGNOSIS — I4891 Unspecified atrial fibrillation: Principal | ICD-10-CM | POA: Diagnosis present

## 2016-10-31 DIAGNOSIS — Z888 Allergy status to other drugs, medicaments and biological substances status: Secondary | ICD-10-CM | POA: Diagnosis not present

## 2016-10-31 DIAGNOSIS — I110 Hypertensive heart disease with heart failure: Secondary | ICD-10-CM | POA: Diagnosis not present

## 2016-10-31 DIAGNOSIS — J302 Other seasonal allergic rhinitis: Secondary | ICD-10-CM | POA: Diagnosis present

## 2016-10-31 DIAGNOSIS — J9811 Atelectasis: Secondary | ICD-10-CM | POA: Diagnosis present

## 2016-10-31 DIAGNOSIS — Z8249 Family history of ischemic heart disease and other diseases of the circulatory system: Secondary | ICD-10-CM | POA: Diagnosis not present

## 2016-10-31 DIAGNOSIS — F329 Major depressive disorder, single episode, unspecified: Secondary | ICD-10-CM | POA: Diagnosis present

## 2016-10-31 DIAGNOSIS — J90 Pleural effusion, not elsewhere classified: Secondary | ICD-10-CM | POA: Diagnosis not present

## 2016-10-31 DIAGNOSIS — Z7901 Long term (current) use of anticoagulants: Secondary | ICD-10-CM | POA: Diagnosis not present

## 2016-10-31 DIAGNOSIS — R0602 Shortness of breath: Secondary | ICD-10-CM | POA: Diagnosis present

## 2016-10-31 DIAGNOSIS — I4819 Other persistent atrial fibrillation: Secondary | ICD-10-CM

## 2016-10-31 DIAGNOSIS — K219 Gastro-esophageal reflux disease without esophagitis: Secondary | ICD-10-CM | POA: Diagnosis not present

## 2016-10-31 DIAGNOSIS — Z79899 Other long term (current) drug therapy: Secondary | ICD-10-CM

## 2016-10-31 DIAGNOSIS — K5790 Diverticulosis of intestine, part unspecified, without perforation or abscess without bleeding: Secondary | ICD-10-CM | POA: Diagnosis not present

## 2016-10-31 DIAGNOSIS — I351 Nonrheumatic aortic (valve) insufficiency: Secondary | ICD-10-CM

## 2016-10-31 DIAGNOSIS — E039 Hypothyroidism, unspecified: Secondary | ICD-10-CM | POA: Diagnosis not present

## 2016-10-31 HISTORY — DX: Other chronic pain: G89.29

## 2016-10-31 HISTORY — DX: Low back pain, unspecified: M54.50

## 2016-10-31 HISTORY — DX: Other persistent atrial fibrillation: I48.19

## 2016-10-31 HISTORY — DX: Overactive bladder: N32.81

## 2016-10-31 HISTORY — DX: Low back pain: M54.5

## 2016-10-31 HISTORY — DX: Malignant neoplasm of unspecified site of unspecified female breast: C50.919

## 2016-10-31 HISTORY — DX: Anxiety disorder, unspecified: F41.9

## 2016-10-31 HISTORY — DX: Reserved for concepts with insufficient information to code with codable children: IMO0002

## 2016-10-31 LAB — CBC WITH DIFFERENTIAL/PLATELET
Basophils Absolute: 0 10*3/uL (ref 0.0–0.1)
Basophils Relative: 0 %
EOS ABS: 0.1 10*3/uL (ref 0.0–0.7)
EOS PCT: 2 %
HEMATOCRIT: 38.1 % (ref 36.0–46.0)
Hemoglobin: 12 g/dL (ref 12.0–15.0)
Lymphocytes Relative: 22 %
Lymphs Abs: 1.8 10*3/uL (ref 0.7–4.0)
MCH: 27.3 pg (ref 26.0–34.0)
MCHC: 31.5 g/dL (ref 30.0–36.0)
MCV: 86.8 fL (ref 78.0–100.0)
MONOS PCT: 7 %
Monocytes Absolute: 0.6 10*3/uL (ref 0.1–1.0)
Neutro Abs: 5.6 10*3/uL (ref 1.7–7.7)
Neutrophils Relative %: 69 %
PLATELETS: 242 10*3/uL (ref 150–400)
RBC: 4.39 MIL/uL (ref 3.87–5.11)
RDW: 14.6 % (ref 11.5–15.5)
WBC: 8.1 10*3/uL (ref 4.0–10.5)

## 2016-10-31 LAB — COMPREHENSIVE METABOLIC PANEL
ALT: 15 U/L (ref 14–54)
AST: 22 U/L (ref 15–41)
Albumin: 3.1 g/dL — ABNORMAL LOW (ref 3.5–5.0)
Alkaline Phosphatase: 54 U/L (ref 38–126)
Anion gap: 9 (ref 5–15)
BILIRUBIN TOTAL: 0.9 mg/dL (ref 0.3–1.2)
BUN: 14 mg/dL (ref 6–20)
CALCIUM: 9.2 mg/dL (ref 8.9–10.3)
CHLORIDE: 105 mmol/L (ref 101–111)
CO2: 25 mmol/L (ref 22–32)
CREATININE: 1.21 mg/dL — AB (ref 0.44–1.00)
GFR, EST AFRICAN AMERICAN: 49 mL/min — AB (ref >60)
GFR, EST NON AFRICAN AMERICAN: 42 mL/min — AB (ref >60)
Glucose, Bld: 94 mg/dL (ref 65–99)
Potassium: 3.3 mmol/L — ABNORMAL LOW (ref 3.5–5.1)
Sodium: 139 mmol/L (ref 135–145)
TOTAL PROTEIN: 6.3 g/dL — AB (ref 6.5–8.1)

## 2016-10-31 LAB — TROPONIN I

## 2016-10-31 LAB — TSH: TSH: 0.067 u[IU]/mL — ABNORMAL LOW (ref 0.350–4.500)

## 2016-10-31 MED ORDER — FLUOXETINE HCL 20 MG PO CAPS
40.0000 mg | ORAL_CAPSULE | Freq: Every day | ORAL | Status: DC
  Administered 2016-11-01 – 2016-11-02 (×2): 40 mg via ORAL
  Filled 2016-10-31 (×2): qty 2

## 2016-10-31 MED ORDER — PANTOPRAZOLE SODIUM 40 MG PO TBEC
40.0000 mg | DELAYED_RELEASE_TABLET | Freq: Every day | ORAL | Status: DC
  Administered 2016-10-31 – 2016-11-02 (×3): 40 mg via ORAL
  Filled 2016-10-31 (×3): qty 1

## 2016-10-31 MED ORDER — SODIUM CHLORIDE 0.9 % IV SOLN: 250.0000 mL | INTRAVENOUS | Status: DC

## 2016-10-31 MED ORDER — POTASSIUM CHLORIDE CRYS ER 20 MEQ PO TBCR
20.0000 meq | EXTENDED_RELEASE_TABLET | Freq: Once | ORAL | Status: AC
  Administered 2016-10-31: 20 meq via ORAL
  Filled 2016-10-31: qty 1

## 2016-10-31 MED ORDER — FLUOXETINE HCL 40 MG PO CAPS: 40.0000 mg | ORAL_CAPSULE | Freq: Every day | ORAL | Status: DC

## 2016-10-31 MED ORDER — VITAMIN D 1000 UNITS PO TABS
1000.0000 [IU] | ORAL_TABLET | Freq: Every day | ORAL | Status: DC
  Administered 2016-11-01 – 2016-11-02 (×2): 1000 [IU] via ORAL
  Filled 2016-10-31 (×2): qty 1

## 2016-10-31 MED ORDER — TRIMETHOPRIM 100 MG PO TABS
100.0000 mg | ORAL_TABLET | Freq: Every day | ORAL | Status: DC
  Administered 2016-11-01 – 2016-11-02 (×2): 100 mg via ORAL
  Filled 2016-10-31 (×2): qty 1

## 2016-10-31 MED ORDER — FUROSEMIDE 10 MG/ML IJ SOLN
40.0000 mg | Freq: Once | INTRAMUSCULAR | Status: AC
  Administered 2016-10-31: 40 mg via INTRAVENOUS
  Filled 2016-10-31: qty 4

## 2016-10-31 MED ORDER — FERROUS SULFATE 325 (65 FE) MG PO TABS
325.0000 mg | ORAL_TABLET | Freq: Every day | ORAL | Status: DC
  Administered 2016-11-02: 325 mg via ORAL
  Filled 2016-10-31 (×2): qty 1

## 2016-10-31 MED ORDER — RIVAROXABAN 20 MG PO TABS
20.0000 mg | ORAL_TABLET | Freq: Every day | ORAL | Status: DC
  Administered 2016-10-31 – 2016-11-01 (×2): 20 mg via ORAL
  Filled 2016-10-31 (×2): qty 1

## 2016-10-31 MED ORDER — ACETAMINOPHEN 325 MG PO TABS: 650.0000 mg | ORAL_TABLET | ORAL | Status: DC | PRN

## 2016-10-31 MED ORDER — SODIUM CHLORIDE 0.9% FLUSH: 3.0000 mL | INTRAVENOUS | Status: DC | PRN

## 2016-10-31 MED ORDER — SODIUM CHLORIDE 0.9 % IV SOLN
250.0000 mL | INTRAVENOUS | Status: DC | PRN
  Administered 2016-11-02: 08:00:00 via INTRAVENOUS

## 2016-10-31 MED ORDER — METOPROLOL SUCCINATE ER 25 MG PO TB24
25.0000 mg | ORAL_TABLET | Freq: Every day | ORAL | Status: DC
  Administered 2016-11-01 – 2016-11-02 (×2): 25 mg via ORAL
  Filled 2016-10-31 (×2): qty 1

## 2016-10-31 MED ORDER — LORATADINE 10 MG PO TABS
10.0000 mg | ORAL_TABLET | Freq: Every day | ORAL | Status: DC
  Administered 2016-11-01 – 2016-11-02 (×2): 10 mg via ORAL
  Filled 2016-10-31 (×2): qty 1

## 2016-10-31 MED ORDER — METOPROLOL TARTRATE 5 MG/5ML IV SOLN
INTRAVENOUS | Status: AC
  Filled 2016-10-31: qty 5

## 2016-10-31 MED ORDER — LEVOTHYROXINE SODIUM 75 MCG PO TABS
205.5000 ug | ORAL_TABLET | Freq: Every day | ORAL | Status: DC
  Administered 2016-11-01 – 2016-11-02 (×2): 205.5 ug via ORAL
  Filled 2016-10-31 (×2): qty 2
  Filled 2016-10-31 (×2): qty 1

## 2016-10-31 MED ORDER — SODIUM CHLORIDE 0.9% FLUSH: 3.0000 mL | Freq: Two times a day (BID) | INTRAVENOUS | Status: DC

## 2016-10-31 MED ORDER — CHOLECALCIFEROL 25 MCG (1000 UT) PO CAPS: 1000.0000 [IU] | ORAL_CAPSULE | Freq: Every day | ORAL | Status: DC

## 2016-10-31 MED ORDER — ZOLPIDEM TARTRATE 5 MG PO TABS
5.0000 mg | ORAL_TABLET | Freq: Every evening | ORAL | Status: DC | PRN
Start: 2016-10-31 — End: 2016-11-02

## 2016-10-31 MED ORDER — SODIUM CHLORIDE 0.9% FLUSH
3.0000 mL | Freq: Two times a day (BID) | INTRAVENOUS | Status: DC
  Administered 2016-10-31 – 2016-11-01 (×3): 3 mL via INTRAVENOUS

## 2016-10-31 MED ORDER — METOPROLOL TARTRATE 5 MG/5ML IV SOLN
2.5000 mg | Freq: Four times a day (QID) | INTRAVENOUS | Status: AC
  Administered 2016-10-31 – 2016-11-01 (×4): 2.5 mg via INTRAVENOUS
  Filled 2016-10-31 (×3): qty 5

## 2016-10-31 MED ORDER — TRAZODONE HCL 150 MG PO TABS
75.0000 mg | ORAL_TABLET | ORAL | Status: DC | PRN
  Filled 2016-10-31: qty 1

## 2016-10-31 NOTE — H&P (Signed)
Cardiology Admission History and Physical:    Date:  10/31/2016   ID:  Teresa York, DOB 11/25/1940, MRN [id number]  PCP:  Maylon Peppers, MD  Cardiologist:  Dr. Lauree Chandler   Electrophysiologist:  n/a  Referring MD: Maylon Peppers, MD   Chief Complaint  Patient presents with  . Shortness of Breath    History of Present Illness:    Teresa York is a 76 y.o. female with a hx of HTN, HL, OSA, GERD, Barrett's esophagus, atrial fibrillation.  She has been managed with Xarelto for anticoagulation.  Last seen by Dr. Lauree Chandler in 9/17.    She is referred back by her PCP for evaluation of CHF and AFib.  She has not felt well for the last 5 days.  She notes rapid palpitations, chest pain that is continuous, dyspnea on exertion with minimal activity, orthopnea, PND, leg edema. She was given Lasix 20 mg x 1 in her PCPs office.  She notes minimal improvement since.  She is not comfortable staying home with how she feels.  She has not missed any doses of Xarelto.  CXR at her PCPs office did show evidence of CHF.    CXR 10/29/16 FINDINGS: Enlargement of cardiac silhouette with pulmonary vascular congestion. Atherosclerotic calcification aorta. Small bibasilar pleural effusions atelectasis. Moderate-sized hiatal hernia seen on the previous exam is not well demonstrated on the current study. Peribronchial thickening and mild perihilar interstitial changes may reflect mild pulmonary edema. No pneumothorax. Bones demineralized.  Prior CV studies that were reviewed today include:    Echo 6/17 Mild conc LVH, EF 55-60, no RWMA, Gr 2 DD, calc AV, mod AI, MAC, mild MR, mod LAE  Echo 3/15 Mild conc LVH, EF 55-60, no RWMA, Gr 1 DD, mild to mod AI, aortic sclerosis without stenosis, trivial MR, severe LAE (mL/m2), mild RAE, mild TR  Past Medical History:  Diagnosis Date  . Barrett's esophagus   . Bronchitis   . Depression   . Diverticulosis   . GERD (gastroesophageal reflux  disease)   . Hiatal hernia   . HX: breast cancer 2005  . Hypertension   . Hypothyroidism   . Seasonal allergies     Past Surgical History:  Procedure Laterality Date  . BACK SURGERY     L5  . bilateral carpal tunnel release  1995  . BREAST LUMPECTOMY  2005   left breast  . eyelid surgery     Mohs  . fracture right elbow  2009   with implant  . TUBAL LIGATION  1980    Current Medications: Current Meds  Medication Sig  . cetirizine (ZYRTEC) 10 MG tablet Take 10 mg by mouth daily. Reported on 10/27/2015  . Cholecalciferol (D 1000) 1000 units capsule Take 1,000 Units by mouth daily. Reported on 11/03/2015  . ferrous sulfate (SM IRON) 325 (65 FE) MG tablet Take 325 mg by mouth.  Marland Kitchen FLUoxetine (PROZAC) 40 MG capsule Take 40 mg by mouth daily.   Marland Kitchen levothyroxine (SYNTHROID, LEVOTHROID) 137 MCG tablet Take 205.5 mcg by mouth daily. Take 1 & 1/2 tablets by mouth daily  . metoprolol succinate (TOPROL-XL) 25 MG 24 hr tablet TAKE ONE TABLET BY MOUTH ONCE DAILY  . omeprazole (PRILOSEC) 40 MG capsule Take 40 mg by mouth 2 (two) times daily.  . rivaroxaban (XARELTO) 20 MG TABS tablet TAKE ONE TABLET BY MOUTH ONCE DAILY WITH SUPPER  . traZODone (DESYREL) 150 MG tablet Take 75 mg by mouth as needed for sleep.   Marland Kitchen  trimethoprim (TRIMPEX) 100 MG tablet Take 100 mg by mouth daily.   Marland Kitchen zolpidem (AMBIEN) 5 MG tablet Take 5 mg by mouth at bedtime as needed for sleep.      Allergies:   Benazepril; Diltiazem; and Diltiazem hcl   Social History   Social History  . Marital status: Married    Spouse name: N/A  . Number of children: 2  . Years of education: N/A   Occupational History  . Retired Retired   Social History Main Topics  . Smoking status: Never Smoker  . Smokeless tobacco: Never Used  . Alcohol use No  . Drug use: No  . Sexual activity: Not Asked   Other Topics Concern  . None   Social History Narrative  . None     Family History:  The patient's family history includes Heart  attack (age of onset: 18) in her father.   ROS:   Please see the history of present illness.    Review of Systems  Constitution: Positive for chills, decreased appetite, malaise/fatigue and weight gain.  Eyes: Positive for visual disturbance.  Cardiovascular: Positive for chest pain, dyspnea on exertion and leg swelling.  Respiratory: Positive for shortness of breath, snoring and wheezing.   Hematologic/Lymphatic: Bruises/bleeds easily.  Musculoskeletal: Positive for back pain and joint pain.  Gastrointestinal: Positive for abdominal pain.  Neurological: Positive for loss of balance.  Psychiatric/Behavioral: The patient is nervous/anxious.    All other systems reviewed and are negative.   EKGs/Labs/Other Test Reviewed:    EKG:  EKG is  ordered today.  The ekg ordered today demonstrates AFib, HR 135, NSSTTW changes ECG from PCP 10/29/16 - AF with RVR, HR 136  Recent Labs: No results found for requested labs within last 8760 hours.   Recent Lipid Panel No results found for: CHOL, TRIG, HDL, CHOLHDL, VLDL, LDLCALC, LDLDIRECT   Physical Exam:    VS:  BP 122/80 (BP Location: Left Arm, Patient Position: Sitting, Cuff Size: Large)   Pulse (!) 136   Ht _0  (1.575 m)   Wt 197 lb 6.4 oz (89.5 kg)   BMI 36.10 kg/m     Wt Readings from Last 3 Encounters:  10/31/16 197 lb 6.4 oz (89.5 kg)  06/22/16 184 lb 12.8 oz (83.8 kg)  11/03/15 184 lb (83.5 kg)     Physical Exam  Constitutional: She is oriented to person, place, and time. She appears well-developed and well-nourished. No distress.  HENT:  Head: Normocephalic and atraumatic.  Eyes: No scleral icterus.  Neck: JVD present.  JVP 10 cm at 90 degrees  Cardiovascular: S1 normal and S2 normal.  An irregularly irregular rhythm present. Tachycardia present.   No murmur heard. Pulmonary/Chest: Effort normal. She has no wheezes. She has no rales.  Abdominal: Soft. She exhibits distension. There is no tenderness.  Musculoskeletal:  She exhibits edema.  Trace bilateral LE edema  Neurological: She is alert and oriented to person, place, and time.  Skin: Skin is warm and dry.  Psychiatric: She has a normal mood and affect.    ASSESSMENT:    1. Atrial fibrillation with RVR ([place])   2. Acute congestive heart failure, unspecified congestive heart failure type ([place])   3. Aortic valve insufficiency, etiology of cardiac valve disease unspecified    PLAN:    In order of problems listed above:  1. AF with RVR - She is in atrial fibrillation with rapid ventricular rate. This has led to congestive heart failure as evidenced by  her chest x-ray earlier this week. We discussed returning to the hospital tomorrow for outpatient cardioversion versus admission to the hospital tonight. She is not comfortable going home based upon how she has been feeling. I discussed with Dr. Radford Pax (DOD). We plan to admit her to the hospital for observation. She will be diuresed and set up for cardioversion tomorrow.  -  Admit for observation to [facility]  -  Lasix 40 mg IV x 1 tonight  -  Continue low dose Toprol  -  She has a hx of rash with Diltiazem - will cover with IV Lopressor 2.5 mg Q 6 hours.  -  Continue Xarelto 20 mg QD  -  Plan DCCV tomorrow  -  Check TSH  2. Acute CHF - She is volume overloaded in the setting of AF with RVR. It is not clear if this is diastolic CHF or if she has developed a tachycardia mediated CM.    -  Lasix 40 mg IV x 1  -  K+ 20 mEq po x 1  -  Obtain Echo  3. Aortic insufficiency - Repeat echo as noted.    4. Chest pain - Likely related to AF with RVR.  Will cycle enzymes to r/o MI.    Signed, Richardson Dopp, PA-C  10/31/2016 5:00 PM

## 2016-10-31 NOTE — Patient Instructions (Signed)
YOU ARE BEING ADMITTED TO [place]; [facility] YOU IN JUST A LITTLE BIT WHEN THEY HAVE A ROOM READY TO COME OVER

## 2016-10-31 NOTE — Progress Notes (Signed)
Cardiology Office Note:    Date:  10/31/2016   ID:  Teresa York, DOB 1941-06-16, MRN [id number]  PCP:  Maylon Peppers, MD  Cardiologist:  Dr. Lauree Chandler   Electrophysiologist:  n/a  Referring MD: Maylon Peppers, MD   Chief Complaint  Patient presents with  . Shortness of Breath    History of Present Illness:    Teresa York is a 76 y.o. female with a hx of HTN, HL, OSA, GERD, Barrett's esophagus, atrial fibrillation.  She has been managed with Xarelto for anticoagulation.  Last seen by Dr. Lauree Chandler in 9/17.    She is referred back by her PCP for evaluation of CHF and AFib.  She has not felt well for the last 5 days.  She notes rapid palpitations, chest pain that is continuous, dyspnea on exertion with minimal activity, orthopnea, PND, leg edema. She was given Lasix 20 mg x 1 in her PCPs office.  She notes minimal improvement since.  She is not comfortable staying home with how she feels.  She has not missed any doses of Xarelto.  CXR at her PCPs office did show evidence of CHF.    CXR 10/29/16 FINDINGS: Enlargement of cardiac silhouette with pulmonary vascular congestion. Atherosclerotic calcification aorta. Small bibasilar pleural effusions atelectasis. Moderate-sized hiatal hernia seen on the previous exam is not well demonstrated on the current study. Peribronchial thickening and mild perihilar interstitial changes may reflect mild pulmonary edema. No pneumothorax. Bones demineralized.  Prior CV studies that were reviewed today include:    Echo 6/17 Mild conc LVH, EF 55-60, no RWMA, Gr 2 DD, calc AV, mod AI, MAC, mild MR, mod LAE  Echo 3/15 Mild conc LVH, EF 55-60, no RWMA, Gr 1 DD, mild to mod AI, aortic sclerosis without stenosis, trivial MR, severe LAE (mL/m2), mild RAE, mild TR  Past Medical History:  Diagnosis Date  . Barrett's esophagus   . Bronchitis   . Depression   . Diverticulosis   . GERD (gastroesophageal reflux disease)   . Hiatal  hernia   . HX: breast cancer 2005  . Hypertension   . Hypothyroidism   . Seasonal allergies     Past Surgical History:  Procedure Laterality Date  . BACK SURGERY     L5  . bilateral carpal tunnel release  1995  . BREAST LUMPECTOMY  2005   left breast  . eyelid surgery     Mohs  . fracture right elbow  2009   with implant  . TUBAL LIGATION  1980    Current Medications: Current Meds  Medication Sig  . cetirizine (ZYRTEC) 10 MG tablet Take 10 mg by mouth daily. Reported on 10/27/2015  . Cholecalciferol (D 1000) 1000 units capsule Take 1,000 Units by mouth daily. Reported on 11/03/2015  . ferrous sulfate (SM IRON) 325 (65 FE) MG tablet Take 325 mg by mouth.  Marland Kitchen FLUoxetine (PROZAC) 40 MG capsule Take 40 mg by mouth daily.   Marland Kitchen levothyroxine (SYNTHROID, LEVOTHROID) 137 MCG tablet Take 205.5 mcg by mouth daily. Take 1 & 1/2 tablets by mouth daily  . metoprolol succinate (TOPROL-XL) 25 MG 24 hr tablet TAKE ONE TABLET BY MOUTH ONCE DAILY  . omeprazole (PRILOSEC) 40 MG capsule Take 40 mg by mouth 2 (two) times daily.  . rivaroxaban (XARELTO) 20 MG TABS tablet TAKE ONE TABLET BY MOUTH ONCE DAILY WITH SUPPER  . traZODone (DESYREL) 150 MG tablet Take 75 mg by mouth as needed for sleep.   Marland Kitchen trimethoprim (  TRIMPEX) 100 MG tablet Take 100 mg by mouth daily.   Marland Kitchen zolpidem (AMBIEN) 5 MG tablet Take 5 mg by mouth at bedtime as needed for sleep.      Allergies:   Benazepril; Diltiazem; and Diltiazem hcl   Social History   Social History  . Marital status: Married    Spouse name: N/A  . Number of children: 2  . Years of education: N/A   Occupational History  . Retired Retired   Social History Main Topics  . Smoking status: Never Smoker  . Smokeless tobacco: Never Used  . Alcohol use No  . Drug use: No  . Sexual activity: Not Asked   Other Topics Concern  . None   Social History Narrative  . None     Family History:  The patient's family history includes Heart attack (age of  onset: 59) in her father.   ROS:   Please see the history of present illness.    Review of Systems  Constitution: Positive for chills, decreased appetite, malaise/fatigue and weight gain.  Eyes: Positive for visual disturbance.  Cardiovascular: Positive for chest pain, dyspnea on exertion and leg swelling.  Respiratory: Positive for shortness of breath, snoring and wheezing.   Hematologic/Lymphatic: Bruises/bleeds easily.  Musculoskeletal: Positive for back pain and joint pain.  Gastrointestinal: Positive for abdominal pain.  Neurological: Positive for loss of balance.  Psychiatric/Behavioral: The patient is nervous/anxious.    All other systems reviewed and are negative.   EKGs/Labs/Other Test Reviewed:    EKG:  EKG is  ordered today.  The ekg ordered today demonstrates AFib, HR 135, NSSTTW changes ECG from PCP 10/29/16 - AF with RVR, HR 136  Recent Labs: No results found for requested labs within last 8760 hours.   Recent Lipid Panel No results found for: CHOL, TRIG, HDL, CHOLHDL, VLDL, LDLCALC, LDLDIRECT   Physical Exam:    VS:  BP 122/80 (BP Location: Left Arm, Patient Position: Sitting, Cuff Size: Large)   Pulse (!) 136   Ht _0  (1.575 m)   Wt 197 lb 6.4 oz (89.5 kg)   BMI 36.10 kg/m     Wt Readings from Last 3 Encounters:  10/31/16 197 lb 6.4 oz (89.5 kg)  06/22/16 184 lb 12.8 oz (83.8 kg)  11/03/15 184 lb (83.5 kg)     Physical Exam  Constitutional: She is oriented to person, place, and time. She appears well-developed and well-nourished. No distress.  HENT:  Head: Normocephalic and atraumatic.  Eyes: No scleral icterus.  Neck: JVD present.  JVP 10 cm at 90 degrees  Cardiovascular: S1 normal and S2 normal.  An irregularly irregular rhythm present. Tachycardia present.   No murmur heard. Pulmonary/Chest: Effort normal. She has no wheezes. She has no rales.  Abdominal: Soft. She exhibits distension. There is no tenderness.  Musculoskeletal: She exhibits  edema.  Trace bilateral LE edema  Neurological: She is alert and oriented to person, place, and time.  Skin: Skin is warm and dry.  Psychiatric: She has a normal mood and affect.    ASSESSMENT:    1. Atrial fibrillation with RVR ([place])   2. Acute congestive heart failure, unspecified congestive heart failure type ([place])   3. Aortic valve insufficiency, etiology of cardiac valve disease unspecified    PLAN:    In order of problems listed above:  1. AF with RVR - She is in atrial fibrillation with rapid ventricular rate. This has led to congestive heart failure as evidenced by her  chest x-ray earlier this week. We discussed returning to the hospital tomorrow for outpatient cardioversion versus admission to the hospital tonight. She is not comfortable going home based upon how she has been feeling. I discussed with Dr. Radford Pax (DOD). We plan to admit her to the hospital for observation. She will be diuresed and set up for cardioversion tomorrow.  -  Admit for observation to [facility]  -  Lasix 40 mg IV x 1 tonight  -  Continue low dose Toprol  -  She has a hx of rash with Diltiazem - will cover with IV Lopressor 2.5 mg Q 6 hours.  -  Continue Xarelto 20 mg QD  -  Plan DCCV tomorrow  -  Check TSH  2. Acute CHF - She is volume overloaded in the setting of AF with RVR. It is not clear if this is diastolic CHF or if she has developed a tachycardia mediated CM.    -  Lasix 40 mg IV x 1  -  K+ 20 mEq po x 1  -  Obtain Echo  3. Aortic insufficiency - Repeat echo as noted.    4. Chest pain - Likely related to AF with RVR.  Will cycle enzymes to r/o MI.     Medication Adjustments/Labs and Tests Ordered: Current medicines are reviewed at length with the patient today.  Concerns regarding medicines are outlined above.  Medication changes, Labs and Tests ordered today are outlined in the Patient Instructions noted below. Patient Instructions  YOU ARE BEING ADMITTED TO [place]; [place] YOU IN JUST A LITTLE BIT WHEN THEY HAVE A ROOM READY TO COME OVER  Signed, Richardson Dopp, PA-C  10/31/2016 5:16 PM    [facility] [address], [place], [place]  [address] Phone: [phone]; Fax: [phone]

## 2016-11-01 ENCOUNTER — Encounter (HOSPITAL_COMMUNITY): Payer: Self-pay | Admitting: Student

## 2016-11-01 ENCOUNTER — Observation Stay (HOSPITAL_BASED_OUTPATIENT_CLINIC_OR_DEPARTMENT_OTHER): Payer: MEDICARE

## 2016-11-01 DIAGNOSIS — I5031 Acute diastolic (congestive) heart failure: Secondary | ICD-10-CM

## 2016-11-01 DIAGNOSIS — G4733 Obstructive sleep apnea (adult) (pediatric): Secondary | ICD-10-CM | POA: Diagnosis not present

## 2016-11-01 DIAGNOSIS — Z8249 Family history of ischemic heart disease and other diseases of the circulatory system: Secondary | ICD-10-CM | POA: Diagnosis not present

## 2016-11-01 DIAGNOSIS — I080 Rheumatic disorders of both mitral and aortic valves: Secondary | ICD-10-CM | POA: Diagnosis not present

## 2016-11-01 DIAGNOSIS — I5041 Acute combined systolic (congestive) and diastolic (congestive) heart failure: Secondary | ICD-10-CM | POA: Diagnosis not present

## 2016-11-01 DIAGNOSIS — K5790 Diverticulosis of intestine, part unspecified, without perforation or abscess without bleeding: Secondary | ICD-10-CM | POA: Diagnosis not present

## 2016-11-01 DIAGNOSIS — K2270 Barrett's esophagus without dysplasia: Secondary | ICD-10-CM | POA: Diagnosis not present

## 2016-11-01 DIAGNOSIS — K219 Gastro-esophageal reflux disease without esophagitis: Secondary | ICD-10-CM | POA: Diagnosis not present

## 2016-11-01 DIAGNOSIS — I110 Hypertensive heart disease with heart failure: Secondary | ICD-10-CM | POA: Diagnosis not present

## 2016-11-01 DIAGNOSIS — I4891 Unspecified atrial fibrillation: Secondary | ICD-10-CM

## 2016-11-01 DIAGNOSIS — R0602 Shortness of breath: Secondary | ICD-10-CM | POA: Diagnosis present

## 2016-11-01 DIAGNOSIS — Z888 Allergy status to other drugs, medicaments and biological substances status: Secondary | ICD-10-CM | POA: Diagnosis not present

## 2016-11-01 DIAGNOSIS — Z7901 Long term (current) use of anticoagulants: Secondary | ICD-10-CM | POA: Diagnosis not present

## 2016-11-01 DIAGNOSIS — E039 Hypothyroidism, unspecified: Secondary | ICD-10-CM | POA: Diagnosis not present

## 2016-11-01 DIAGNOSIS — I5021 Acute systolic (congestive) heart failure: Secondary | ICD-10-CM | POA: Diagnosis not present

## 2016-11-01 DIAGNOSIS — J90 Pleural effusion, not elsewhere classified: Secondary | ICD-10-CM | POA: Diagnosis not present

## 2016-11-01 DIAGNOSIS — F329 Major depressive disorder, single episode, unspecified: Secondary | ICD-10-CM | POA: Diagnosis not present

## 2016-11-01 DIAGNOSIS — Z853 Personal history of malignant neoplasm of breast: Secondary | ICD-10-CM | POA: Diagnosis not present

## 2016-11-01 DIAGNOSIS — E876 Hypokalemia: Secondary | ICD-10-CM | POA: Diagnosis not present

## 2016-11-01 DIAGNOSIS — J9811 Atelectasis: Secondary | ICD-10-CM | POA: Diagnosis not present

## 2016-11-01 DIAGNOSIS — J302 Other seasonal allergic rhinitis: Secondary | ICD-10-CM | POA: Diagnosis not present

## 2016-11-01 LAB — BASIC METABOLIC PANEL
ANION GAP: 8 (ref 5–15)
BUN: 13 mg/dL (ref 6–20)
CALCIUM: 9 mg/dL (ref 8.9–10.3)
CO2: 30 mmol/L (ref 22–32)
Chloride: 104 mmol/L (ref 101–111)
Creatinine, Ser: 1.18 mg/dL — ABNORMAL HIGH (ref 0.44–1.00)
GFR, EST AFRICAN AMERICAN: 50 mL/min — AB (ref >60)
GFR, EST NON AFRICAN AMERICAN: 43 mL/min — AB (ref >60)
GLUCOSE: 92 mg/dL (ref 65–99)
POTASSIUM: 3.2 mmol/L — AB (ref 3.5–5.1)
Sodium: 142 mmol/L (ref 135–145)

## 2016-11-01 LAB — ECHOCARDIOGRAM COMPLETE
HEIGHTINCHES: 62 in
Weight: 3094.4 oz

## 2016-11-01 LAB — TROPONIN I

## 2016-11-01 MED ORDER — POTASSIUM CHLORIDE CRYS ER 20 MEQ PO TBCR
40.0000 meq | EXTENDED_RELEASE_TABLET | Freq: Once | ORAL | Status: AC
  Administered 2016-11-01: 40 meq via ORAL
  Filled 2016-11-01: qty 2

## 2016-11-01 NOTE — Progress Notes (Signed)
  Echocardiogram 2D Echocardiogram has been performed.  Teresa York 11/01/2016, 1:29 PM

## 2016-11-01 NOTE — Progress Notes (Signed)
Progress Note  Patient Name: Teresa York Date of Encounter: 11/01/2016  Primary Cardiologist: Dr. [name]  Subjective   Breathing is good this morning.   Inpatient Medications    Scheduled Meds: . cholecalciferol  1,000 Units Oral Daily  . [START ON 11/02/2016] ferrous sulfate  325 mg Oral Q breakfast  . FLUoxetine  40 mg Oral Daily  . levothyroxine  205.5 mcg Oral QAC breakfast  . loratadine  10 mg Oral Daily  . metoprolol  2.5 mg Intravenous Q6H  . metoprolol succinate  25 mg Oral Daily  . pantoprazole  40 mg Oral Daily  . potassium chloride  40 mEq Oral Once  . rivaroxaban  20 mg Oral Q supper  . sodium chloride flush  3 mL Intravenous Q12H  . sodium chloride flush  3 mL Intravenous Q12H  . trimethoprim  100 mg Oral Daily   Continuous Infusions: . sodium chloride     PRN Meds: sodium chloride, acetaminophen, sodium chloride flush, sodium chloride flush, traZODone, zolpidem   Vital Signs    Vitals:   11/01/16 0051 11/01/16 0520 11/01/16 0538 11/01/16 0800  BP: 123/68 125/87  113/81  Pulse: (!) 106 68 (!) 106 94  Resp:  18  18  Temp:  98 F (36.7 C)  98.1 F (36.7 C)  TempSrc:  Oral  Oral  SpO2:    100%  Weight:  193 lb 6.4 oz (87.7 kg)    Height:        Intake/Output Summary (Last 24 hours) at 11/01/16 1010 Last data filed at 11/01/16 0805  Gross per 24 hour  Intake              120 ml  Output             1450 ml  Net            -1330 ml   Filed Weights   10/31/16 1957 11/01/16 0520  Weight: 195 lb 12.3 oz (88.8 kg) 193 lb 6.4 oz (87.7 kg)    Telemetry    AF rates 100-130s - Personally Reviewed  ECG    AF rate 118 - Personally Reviewed  Physical Exam   GEN: No acute distress.  Neck: No JVD Cardiac: Irreg Irreg, 1/6 systolic murmur, rubs, or gallops.  Respiratory: Clear to auscultation bilaterally. GI: Soft, nontender, non-distended  MS: No edema; No deformity. Neuro:  AAOx3. Psych: Normal affect  Labs    Chemistry Recent  Labs Lab 10/31/16 2021 11/01/16 0827  NA 139 142  K 3.3* 3.2*  CL 105 104  CO2 25 30  GLUCOSE 94 92  BUN 14 13  CREATININE 1.21* 1.18*  CALCIUM 9.2 9.0  PROT 6.3*  --   ALBUMIN 3.1*  --   AST 22  --   ALT 15  --   ALKPHOS 54  --   BILITOT 0.9  --   GFRNONAA 42* 43*  GFRAA 49* 50*  ANIONGAP 9 8     Hematology Recent Labs Lab 10/31/16 2021  WBC 8.1  RBC 4.39  HGB 12.0  HCT 38.1  MCV 86.8  MCH 27.3  MCHC 31.5  RDW 14.6  PLT 242    Cardiac Enzymes Recent Labs Lab 10/31/16 2021 11/01/16 0224 11/01/16 0827  TROPONINI <0.03 <0.03 <0.03   No results for input(s): TROPIPOC in the last 168 hours.   BNPNo results for input(s): BNP, PROBNP in the last 168 hours.   DDimer No results for input(s): DDIMER in  the last 168 hours.   Radiology    No results found.  Cardiac Studies   N/A  Patient Profile     76 y.o. female with a hx of HTN, HL, OSA, GERD, Barrett's esophagus, atrial fibrillation. She was seen in the office yesterday for dyspnea and weight gain. Found to be in AF RVR, sent to the hospital for admission and DCCV.   Assessment & Plan    1. AF with RVR - Noted to be in AF while in the office yesterday, sent for direct admission with possible DCCV today. No slots today, so will plan for tomorrow. NPO tonight.  -  Currently on Toprol XL 25mg  with IV Lopressor 2.5 mg Q 6 hours. - Continue Xarelto 20 mg QD - TSH was normal   2. Acute CHF - She is volume overloaded in the setting of AF with RVR. It is not clear if this is diastolic CHF or if she has developed a tachycardia mediated CM.  - got one dose of IV lasix with good UOP, breathing better today.  - K+ replaced - echo pending  3. Aortic insufficiency - Repeat echo as noted.    4. Chest pain - No further episodes, she has ruled out.    Signed, Reino Bellis, NP  11/01/2016, 10:10 AM    Personally seen and examined. Agree with above.  Awaiting cardioversion.  Rate 80-120 Irreg  irreg Diastolic HF - lasix. Improved.   Candee Furbish, MD

## 2016-11-01 NOTE — Progress Notes (Signed)
Asked per Pharmacy to check into pt's coverage of Xarelto vs Eliquis to verify pt's coverage for Xarelto and see if coverage any better for Eliquis- (pt was on Xarelto PTA)-  S/W YVONNE  @ [employer] # [phone]   XARELTO 20 MG DAILY  COVER- YES  CO-PAY- $ 142.00  PATIENT ALSO HAVE REFILL FOR MED  PRIOR APPROVAL- NO   ELIQUIS 5 MG BID  COVER- YES  CO-PAY- $142.00  PRIOR APPROVAL- NO  PHARMACY ; [facility]

## 2016-11-01 NOTE — Progress Notes (Signed)
  This patients CHA2DS2-VASc Score and unadjusted Ischemic Stroke Rate (% per year) is equal to 4.8 % stroke rate/year from a score of 4 Above score calculated as 1 point each if present [CHF, HTN, DM, Vascular=MI/PAD/Aortic Plaque, Age if 65-74, or Female] Above score calculated as 2 points each if present [Age > 75, or Stroke/TIA/TE]  Richardson Dopp, PA-C   11/01/2016 12:59 PM

## 2016-11-02 ENCOUNTER — Inpatient Hospital Stay (HOSPITAL_COMMUNITY): Payer: MEDICARE | Admitting: Anesthesiology

## 2016-11-02 ENCOUNTER — Encounter (HOSPITAL_COMMUNITY)
Admission: AD | Disposition: A | Discharge status: Home / Self Care | Payer: Self-pay | Source: Ambulatory Visit | Attending: Cardiology

## 2016-11-02 ENCOUNTER — Encounter (HOSPITAL_COMMUNITY): Payer: Self-pay | Admitting: *Deleted

## 2016-11-02 DIAGNOSIS — R0602 Shortness of breath: Secondary | ICD-10-CM | POA: Diagnosis not present

## 2016-11-02 DIAGNOSIS — I5021 Acute systolic (congestive) heart failure: Secondary | ICD-10-CM

## 2016-11-02 DIAGNOSIS — I4891 Unspecified atrial fibrillation: Secondary | ICD-10-CM | POA: Diagnosis not present

## 2016-11-02 HISTORY — PX: CARDIOVERSION: SHX1299

## 2016-11-02 SURGERY — CARDIOVERSION
Anesthesia: Monitor Anesthesia Care

## 2016-11-02 MED ORDER — LIDOCAINE HCL (CARDIAC) 20 MG/ML IV SOLN
INTRAVENOUS | Status: DC | PRN
  Administered 2016-11-02: 100 mg via INTRATRACHEAL

## 2016-11-02 MED ORDER — PROPOFOL 10 MG/ML IV BOLUS
INTRAVENOUS | Status: DC | PRN
  Administered 2016-11-02: 80 mg via INTRAVENOUS

## 2016-11-02 MED ORDER — FUROSEMIDE 10 MG/ML IJ SOLN
40.0000 mg | Freq: Once | INTRAMUSCULAR | Status: AC
  Administered 2016-11-02: 40 mg via INTRAVENOUS
  Filled 2016-11-02: qty 4

## 2016-11-02 MED ORDER — POTASSIUM CHLORIDE CRYS ER 20 MEQ PO TBCR
40.0000 meq | EXTENDED_RELEASE_TABLET | Freq: Once | ORAL | Status: AC
  Administered 2016-11-02: 40 meq via ORAL
  Filled 2016-11-02: qty 2

## 2016-11-02 NOTE — Progress Notes (Signed)
Received referral for medication coverage needs- insurance check completed on 1/25 for Xarelto and Eliquis- (see CM note from 1/25)-  suspect that Pradaxa would fall within the same cost- of course Coumadin would be cheapest option as it is a $4 medication. Spoke with pt and went over coverage info. Pt states that the letter she received was received in September. Question maybe if it was an issue with auth for last year?- Did give pt the 800# for Xarelto to see if there is anything she might can qualify for as far as pt assistance due to her cost for the drug.

## 2016-11-02 NOTE — Interval H&P Note (Signed)
History and Physical Interval Note:  11/02/2016 8:25 AM  Teresa York  has presented today for surgery, with the diagnosis of AFIB  The various methods of treatment have been discussed with the patient and family. After consideration of risks, benefits and other options for treatment, the patient has consented to  Procedure(s): CARDIOVERSION (N/A) as a surgical intervention .  The patient's history has been reviewed, patient examined, no change in status, stable for surgery.  I have reviewed the patient's chart and labs.  Questions were answered to the patient's satisfaction.     [name]

## 2016-11-02 NOTE — CV Procedure (Signed)
    Cardioversion Note  Teresa York [id number] 1941/08/06  Procedure: DC Cardioversion Indications: atrial fib  Procedure Details Consent: Obtained Time Out: Verified patient identification, verified procedure, site/side was marked, verified correct patient position, special equipment/implants available, Radiology Safety Procedures followed,  medications/allergies/relevent history reviewed, required imaging and test results available.  Performed  The patient has been on adequate anticoagulation.  The patient received Lidocaine 100 mg IV followed by Propofol 80 mg IV  for sedation.  Synchronous cardioversion was performed at 120 joules.  The cardioversion was successful     Complications: No apparent complications Patient did tolerate procedure well.   Thayer Headings, Brooke Bonito., MD, [facility] 11/02/2016, 8:40 AM

## 2016-11-02 NOTE — Anesthesia Postprocedure Evaluation (Signed)
Anesthesia Post Note  Patient: Teresa York  Procedure(s) Performed: Procedure(s) (LRB): CARDIOVERSION (N/A)  Patient location during evaluation: PACU Anesthesia Type: General Level of consciousness: awake and alert Pain management: pain level controlled Vital Signs Assessment: post-procedure vital signs reviewed and stable Respiratory status: spontaneous breathing, nonlabored ventilation, respiratory function stable and patient connected to nasal cannula oxygen Cardiovascular status: blood pressure returned to baseline and stable Postop Assessment: no signs of nausea or vomiting Anesthetic complications: no       Last Vitals:  Vitals:   11/02/16 0905 11/02/16 0934  BP: 119/67 113/61  Pulse: 61 70  Resp: 14 18  Temp:      Last Pain:  Vitals:   11/02/16 0839  TempSrc: Oral  PainSc:                  Trine Fread DAVID

## 2016-11-02 NOTE — H&P (View-Only) (Signed)
  This patients CHA2DS2-VASc Score and unadjusted Ischemic Stroke Rate (% per year) is equal to 4.8 % stroke rate/year from a score of 4 Above score calculated as 1 point each if present [CHF, HTN, DM, Vascular=MI/PAD/Aortic Plaque, Age if 65-74, or Female] Above score calculated as 2 points each if present [Age > 75, or Stroke/TIA/TE]  Richardson Dopp, PA-C   11/01/2016 12:59 PM

## 2016-11-02 NOTE — Progress Notes (Signed)
Patient in a stable condition, discharge education completed with patient and husband at bedside they verbalised understanding, iv remoevd , tele d ccmd notified, patient belongings at bedside, patient to be transported home by her husband

## 2016-11-02 NOTE — Progress Notes (Deleted)
Progress Note  Patient Name: Teresa York Date of Encounter: 11/02/2016  Primary Cardiologist: Dr. [name]   Subjective   Just returned from endo after cardioversion back to NSR. Feels much better now. No chest pain or dyspnea.   Inpatient Medications    Scheduled Meds: . cholecalciferol  1,000 Units Oral Daily  . ferrous sulfate  325 mg Oral Q breakfast  . FLUoxetine  40 mg Oral Daily  . levothyroxine  205.5 mcg Oral QAC breakfast  . loratadine  10 mg Oral Daily  . metoprolol succinate  25 mg Oral Daily  . pantoprazole  40 mg Oral Daily  . rivaroxaban  20 mg Oral Q supper  . sodium chloride flush  3 mL Intravenous Q12H  . sodium chloride flush  3 mL Intravenous Q12H  . trimethoprim  100 mg Oral Daily   Continuous Infusions: . sodium chloride     PRN Meds: sodium chloride, acetaminophen, sodium chloride flush, sodium chloride flush, traZODone, zolpidem   Vital Signs    Vitals:   11/02/16 0845 11/02/16 0850 11/02/16 0855 11/02/16 0905  BP: 93/64 (!) 101/51 (!) 112/48 119/67  Pulse: 61 63 62 61  Resp: 19 12 (!) 21 14  Temp:      TempSrc:      SpO2: 96% 96% 93% 98%  Weight:      Height:        Intake/Output Summary (Last 24 hours) at 11/02/16 0921 Last data filed at 11/02/16 0844  Gross per 24 hour  Intake              200 ml  Output             1650 ml  Net            -1450 ml   Filed Weights   10/31/16 1957 11/01/16 0520 11/02/16 0519  Weight: 195 lb 12.3 oz (88.8 kg) 193 lb 6.4 oz (87.7 kg) 193 lb 3.2 oz (87.6 kg)    Telemetry    NSR 71 bpm  - Personally Reviewed   Physical Exam   GEN: No acute distress.  Neck: No JVD Cardiac: RRR, no murmurs, rubs, or gallops.  Respiratory: Clear to auscultation bilaterally. GI: Soft, nontender, non-distended  MS: No edema; No deformity. Neuro:  AAOx3. Psych: Normal affect  Labs    Chemistry Recent Labs Lab 10/31/16 2021 11/01/16 0827  NA 139 142  K 3.3* 3.2*  CL 105 104  CO2 25 30  GLUCOSE 94  92  BUN 14 13  CREATININE 1.21* 1.18*  CALCIUM 9.2 9.0  PROT 6.3*  --   ALBUMIN 3.1*  --   AST 22  --   ALT 15  --   ALKPHOS 54  --   BILITOT 0.9  --   GFRNONAA 42* 43*  GFRAA 49* 50*  ANIONGAP 9 8     Hematology Recent Labs Lab 10/31/16 2021  WBC 8.1  RBC 4.39  HGB 12.0  HCT 38.1  MCV 86.8  MCH 27.3  MCHC 31.5  RDW 14.6  PLT 242    Cardiac Enzymes Recent Labs Lab 10/31/16 2021 11/01/16 0224 11/01/16 0827  TROPONINI <0.03 <0.03 <0.03   No results for input(s): TROPIPOC in the last 168 hours.   BNPNo results for input(s): BNP, PROBNP in the last 168 hours.   DDimer No results for input(s): DDIMER in the last 168 hours.   Radiology    No results found.  Cardiac Studies  Procedure: DC Cardioversion Indications: atrial fib  Procedure Details Consent: Obtained Time Out: Verified patient identification, verified procedure, site/side was marked, verified correct patient position, special equipment/implants available, Radiology Safety Procedures followed,  medications/allergies/relevent history reviewed, required imaging and test results available.  Performed  The patient has been on adequate anticoagulation.  The patient received Lidocaine 100 mg IV followed by Propofol 80 mg IV  for sedation.  Synchronous cardioversion was performed at 120 joules.  The cardioversion was successful     Complications: No apparent complications Patient did tolerate procedure well.  Patient Profile     76 y.o. female with a hx of HTN, HL, OSA, GERD, Barrett's esophagus, atrial fibrillation. She was seen in the office 10/31/16 for dyspnea and weight gain. Found to be in AF RVR, sent to the hospital for admission and DCCV.   Assessment & Plan    1. AF with RVR - Noted to be in AF while in the office 10/31/16, sent for direct admission for DCCV. -  Pt was successfully cardioverted back to [place] today. HR currently in the 70s on telemetry - Currently on Toprol XL 25mg   -  Continue Anticoagulation. Currently Xarelto 20 mg QD. May need to change NOAC due to cost.  - TSH was normal  - Give supplemental K today for hypokalemia. K is 3.2. Give 40 mEq of K-dur.  - We will arrange post hospital f/u in 1-2 weeks for repeat EKG to ensure that she is still in NSR  2. Acute Systolic CHF - volume overloaded in the setting of AF with RVR. - Echo shows reduced EF of 30% with diffuse hypokinesis (previously 30-35%). 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 Can consider repeat limited echo in 1-2 months, now that she is back in NSR to reassess LVF. Cardiac enzymes were negative x 3. Can consider outpatient NST to r/o ischemia.  -- She has had a good response to IV Lasix. -2.7L out since admit. Breathing improved. D/c supplemental O2 and have pt ambulate halls prior to discharge to see how she does.  -- Supplement K.   3. Aortic insufficiency - Moderate AI noted on TTE. TEE performed today, note not yet complete.    4. Chest pain - No further episodes, she has ruled out for MI.   5. Medication Assistance: pt currently on Xarelto. She notes she received a letter in the mail stating that her insurance will no longer cover Xarelto in 2018. Her Co-pay will be $400/month. Will ask case manager to check insurance to see if she can switch to Eliquis. May need Coumadin if unable to afford. Given her age, weight and renal function, would recommend full dose Eliquis, 5 mg BID.   Signed, Lyda Jester, PA-C  11/02/2016, 9:21 AM

## 2016-11-02 NOTE — Transfer of Care (Signed)
Immediate Anesthesia Transfer of Care Note  Patient: Teresa York  Procedure(s) Performed: Procedure(s): CARDIOVERSION (N/A)  Patient Location: Endoscopy Unit  Anesthesia Type:MAC  Level of Consciousness: awake, alert  and oriented  Airway & Oxygen Therapy: Patient Spontanous Breathing  Post-op Assessment: Report given to RN, Post -op Vital signs reviewed and stable and Patient moving all extremities X 4  Post vital signs: Reviewed and stable  Last Vitals:  Vitals:   11/02/16 0753 11/02/16 0839  BP: (!) 155/102 (!) 104/55  Pulse: (!) 123 63  Resp: 16 19  Temp: 36.4 C     Last Pain:  Vitals:   11/02/16 0839  TempSrc: Oral  PainSc:          Complications: No apparent anesthesia complications

## 2016-11-02 NOTE — Anesthesia Preprocedure Evaluation (Signed)
Anesthesia Evaluation  Patient identified by MRN, date of birth, ID band Patient awake    Reviewed: Allergy & Precautions, NPO status , Patient's Chart, lab work & pertinent test results  Airway Mallampati: I  TM Distance: >3 FB Neck ROM: Full    Dental   Pulmonary    Pulmonary exam normal        Cardiovascular hypertension, Pt. on medications Normal cardiovascular exam+ Valvular Problems/Murmurs AI      Neuro/Psych    GI/Hepatic GERD  Medicated and Controlled,  Endo/Other    Renal/GU      Musculoskeletal   Abdominal   Peds  Hematology   Anesthesia Other Findings   Reproductive/Obstetrics                             Anesthesia Physical Anesthesia Plan  ASA: III  Anesthesia Plan: General   Post-op Pain Management:    Induction: Intravenous  Airway Management Planned: Mask  Additional Equipment:   Intra-op Plan:   Post-operative Plan: Extubation in OR  Informed Consent: I have reviewed the patients History and Physical, chart, labs and discussed the procedure including the risks, benefits and alternatives for the proposed anesthesia with the patient or authorized representative who has indicated his/her understanding and acceptance.     Plan Discussed with: CRNA and Surgeon  Anesthesia Plan Comments:         Anesthesia Quick Evaluation

## 2016-11-02 NOTE — Discharge Summary (Signed)
Discharge Summary    Patient ID: Teresa York,  MRN: [id number], DOB/AGE: 1941-05-09 76 y.o.  Admit date: 10/31/2016 Discharge date: 11/02/2016  Primary Care Provider: [facility] Primary Cardiologist: Dr. [name]   Discharge Diagnoses    Active Problems:   Atrial fibrillation with RVR (HCC)   Allergies Allergies  Allergen Reactions  . Benazepril Cough  . Diltiazem Other (See Comments)    Pt doesn't remember   . Diltiazem Hcl Rash    Diagnostic Studies/Procedures    2D Echo 11/01/16 Study Conclusions  - Left ventricle: The cavity size was normal. There was moderate   concentric hypertrophy. Systolic function was moderately to   severely reduced. The estimated ejection fraction was 30%.   Diffuse hypokinesis. The study was not technically sufficient to   allow evaluation of LV diastolic dysfunction due to atrial   fibrillation. - Aortic valve: There was moderate regurgitation. - Aortic root: The aortic root was normal in size. - Ascending aorta: The ascending aorta was normal in size. - Mitral valve: There was moderate regurgitation. - Left atrium: The atrium was moderately dilated. - Right ventricle: Systolic function was mildly reduced. - Right atrium: The atrium was mildly dilated. - Pulmonary arteries: Systolic pressure was mildly increased. PA   peak pressure: 35 mm Hg (S). - Inferior vena cava: The vessel was normal in size. - Pericardium, extracardiac: There was no pericardial effusion.   There was a left pleural effusion.  Impressions:  - When compared to the prior study from 03/09/2015 LVEF has   significantly decreased from 55-60% to 30-35%. LVEF is partially   underestimated secondary to atrial fibrillation with RVR during   the acquisition.  Procedure: DC Cardioversion 11/02/16 Indications: atrial fib  Procedure Details Consent: Obtained Time Out: Verified patient identification, verified procedure, site/side was marked, verified correct  patient position, special equipment/implants available, Radiology Safety Procedures followed,  medications/allergies/relevent history reviewed, required imaging and test results available.  Performed  The patient has been on adequate anticoagulation.  The patient received Lidocaine 100 mg IV followed by Propofol 80 mg IV  for sedation.  Synchronous cardioversion was performed at 120 joules.  The cardioversion was successful    History of Present Illness     76 y.o.femalewith a hx of HTN, HL, OSA, GERD, Barrett's esophagus, atrial fibrillation. She was seen in the office 10/31/16 for dyspnea and weight gain. Found to be in AF RVR, sent to the hospital for admission and DCCV.   Hospital Course     She was admitted to telemetry for monitoring. She was continued on metoprolol for rate control and Xarelto for a/c. She was also treated with IV Lasix, as she was felt to be mildly volume overloaded. She had good UOP and symptoms improved, however she remained in atrial fibrillation. Cardiac enzymes were cycled x 3 and were negative, ruling her out for MI. 2D echo was obtained showing reduced LVEF at 30-35% (previously 55-60%, June 2017). Grade 2 DD was noted w/o WMA. Moderate AI was noted.   On 11/02/16, she underwent successful TEE guided DCCV. She converted to NSR. She was monitored and had no recurrent arrthymias and no post procedure complications. HR remained well controlled. She was continued on metoprolol for rate control and Xarelto for anticoagulation. She was last seen and examined by Dr. Marlou Porch, who determined she was stable for d/c home. His recommendations for discharge are outlined below.  EF 30% reduced. Possibly tachy mediated.  On Bb. BP low, try to  add ACE or ARB as outpatient.  Recommend repeating (Echo) again in 2 months.  If remains low, consider cath.  Has Barrett's esophagus tx at [facility]. Will have to remain on anticoagulation at the minimum of 4 weeks post cardioversion prior to  holding for treatment.   Consultants: none    Discharge Vitals Blood pressure 113/61, pulse 70, temperature 97.6 F (36.4 C), temperature source Oral, resp. rate 18, height 5\' 2"  (1.575 m), weight 193 lb 3.2 oz (87.6 kg), SpO2 100 %.  Filed Weights   10/31/16 1957 11/01/16 0520 11/02/16 0519  Weight: 195 lb 12.3 oz (88.8 kg) 193 lb 6.4 oz (87.7 kg) 193 lb 3.2 oz (87.6 kg)    Labs & Radiologic Studies    CBC  Recent Labs  10/31/16 2021  WBC 8.1  NEUTROABS 5.6  HGB 12.0  HCT 38.1  MCV 86.8  PLT [id number]   Basic Metabolic Panel  Recent Labs  10/31/16 2021 11/01/16 0827  NA 139 142  K 3.3* 3.2*  CL 105 104  CO2 25 30  GLUCOSE 94 92  BUN 14 13  CREATININE 1.21* 1.18*  CALCIUM 9.2 9.0   Liver Function Tests  Recent Labs  10/31/16 2021  AST 22  ALT 15  ALKPHOS 54  BILITOT 0.9  PROT 6.3*  ALBUMIN 3.1*   No results for input(s): LIPASE, AMYLASE in the last 72 hours. Cardiac Enzymes  Recent Labs  10/31/16 2021 11/01/16 0224 11/01/16 0827  TROPONINI <0.03 <0.03 <0.03   BNP Invalid input(s): POCBNP D-Dimer No results for input(s): DDIMER in the last 72 hours. Hemoglobin A1C No results for input(s): HGBA1C in the last 72 hours. Fasting Lipid Panel No results for input(s): CHOL, HDL, LDLCALC, TRIG, CHOLHDL, LDLDIRECT in the last 72 hours. Thyroid Function Tests  Recent Labs  10/31/16 2021  TSH 0.067*   _____________  No results found. Disposition   Pt is being discharged home today in good condition.  Follow-up Plans & Appointments    Follow-up Information    Lauree Chandler, MD Follow up.   Specialty:  Cardiology Why:  our office will call you with an appointment for post hospital follow-up in 1-2 weeks.  Contact information: [address] [place] [address] [phone]          Discharge Instructions    Diet - low sodium heart healthy    Complete by:  As directed    Increase activity slowly    Complete by:   As directed       Discharge Medications   Current Discharge Medication List    CONTINUE these medications which have NOT CHANGED   Details  cetirizine (ZYRTEC) 10 MG tablet Take 10 mg by mouth daily. Reported on 10/27/2015    Cholecalciferol (D 1000) 1000 units capsule Take 1,000 Units by mouth daily. Reported on 11/03/2015    ferrous sulfate (SM IRON) 325 (65 FE) MG tablet Take 325 mg by mouth.    FLUoxetine (PROZAC) 40 MG capsule Take 40 mg by mouth daily.     levothyroxine (SYNTHROID, LEVOTHROID) 137 MCG tablet Take 205.5 mcg by mouth daily. Take 1 & 1/2 tablets by mouth daily    metoprolol succinate (TOPROL-XL) 25 MG 24 hr tablet TAKE ONE TABLET BY MOUTH ONCE DAILY Qty: 30 tablet, Refills: 11    omeprazole (PRILOSEC) 40 MG capsule Take 40 mg by mouth 2 (two) times daily.    rivaroxaban (XARELTO) 20 MG TABS tablet TAKE ONE TABLET BY MOUTH  ONCE DAILY WITH SUPPER Qty: 30 tablet, Refills: 11    Thiamine HCl (VITAMIN B-1 PO) Take 1 tablet by mouth daily.    traZODone (DESYREL) 150 MG tablet Take 75 mg by mouth as needed for sleep.     zolpidem (AMBIEN) 5 MG tablet Take 5 mg by mouth at bedtime as needed for sleep.     trimethoprim (TRIMPEX) 100 MG tablet Take 100 mg by mouth daily.            Outstanding Labs/Studies  Repeat 2D echo in 2 months   Duration of Discharge Encounter   Greater than 30 minutes including physician time.  Signed, Lyda Jester PA-C 11/02/2016, 1:51 PM  Personally seen and examined. Agree with above.  Successful cardioversion. Ejection fraction 30%. Continue to optimize heart failure medications as outpatient. Recheck echocardiogram in 1-2 months. This may be tachycardia mediated. She is ambulating the hallways well. Ready to go home.  Candee Furbish, MD

## 2016-11-02 NOTE — Progress Notes (Signed)
Progress Note  Patient Name: Teresa York Date of Encounter: 11/02/2016  Primary Cardiologist: Dr. [name]   Subjective   Just returned from endo after cardioversion back to NSR. Feels much better now. No chest pain or dyspnea.   Inpatient Medications    Scheduled Meds: . cholecalciferol  1,000 Units Oral Daily  . ferrous sulfate  325 mg Oral Q breakfast  . FLUoxetine  40 mg Oral Daily  . levothyroxine  205.5 mcg Oral QAC breakfast  . loratadine  10 mg Oral Daily  . metoprolol succinate  25 mg Oral Daily  . pantoprazole  40 mg Oral Daily  . rivaroxaban  20 mg Oral Q supper  . sodium chloride flush  3 mL Intravenous Q12H  . sodium chloride flush  3 mL Intravenous Q12H  . trimethoprim  100 mg Oral Daily   Continuous Infusions: . sodium chloride     PRN Meds: sodium chloride, acetaminophen, sodium chloride flush, sodium chloride flush, traZODone, zolpidem   Vital Signs          Vitals:   11/02/16 0845 11/02/16 0850 11/02/16 0855 11/02/16 0905  BP: 93/64 (!) 101/51 (!) 112/48 119/67  Pulse: 61 63 62 61  Resp: 19 12 (!) 21 14  Temp:      TempSrc:      SpO2: 96% 96% 93% 98%  Weight:      Height:        Intake/Output Summary (Last 24 hours) at 11/02/16 0921 Last data filed at 11/02/16 0844  Gross per 24 hour  Intake              200 ml  Output             1650 ml  Net            -1450 ml        Filed Weights   10/31/16 1957 11/01/16 0520 11/02/16 0519  Weight: 195 lb 12.3 oz (88.8 kg) 193 lb 6.4 oz (87.7 kg) 193 lb 3.2 oz (87.6 kg)    Telemetry    NSR 71 bpm  - Personally Reviewed   Physical Exam   GEN:No acute distress.  Neck:No JVD Cardiac:RRR, no murmurs, rubs, or gallops.  Respiratory:Clear to auscultation bilaterally. [id number], nontender, non-distended  MS:No edema; No deformity. Neuro:AAOx3. Psych: Normal affect  Labs    Chemistry Last Labs    Recent Labs Lab 10/31/16 2021 11/01/16 0827    NA 139 142  K 3.3* 3.2*  CL 105 104  CO2 25 30  GLUCOSE 94 92  BUN 14 13  CREATININE 1.21* 1.18*  CALCIUM 9.2 9.0  PROT 6.3*  --   ALBUMIN 3.1*  --   AST 22  --   ALT 15  --   ALKPHOS 54  --   BILITOT 0.9  --   GFRNONAA 42* 43*  GFRAA 49* 50*  ANIONGAP 9 8       Hematology Last Labs    Recent Labs Lab 10/31/16 2021  WBC 8.1  RBC 4.39  HGB 12.0  HCT 38.1  MCV 86.8  MCH 27.3  MCHC 31.5  RDW 14.6  PLT 242      Cardiac Enzymes Last Labs    Recent Labs Lab 10/31/16 2021 11/01/16 0224 11/01/16 0827  TROPONINI <0.03 <0.03 <0.03      Last Labs   No results for input(s): TROPIPOC in the last 168 hours.     BNP Last Labs   No results  for input(s): BNP, PROBNP in the last 168 hours.     DDimer  Last Labs   No results for input(s): DDIMER in the last 168 hours.     Radiology    Imaging Results (Last 48 hours)  No results found.    Cardiac Studies   Procedure: DC Cardioversion Indications:atrial fib  Procedure Details Consent:Obtained Time [id number] patient identification, verified procedure, site/side was marked, verified correct patient position, special equipment/implants available, Radiology Safety Procedures followed, medications/allergies/relevent history reviewed, required imaging and test results available. Performed  The patient has been on adequate anticoagulation. The patient received Lidocaine 100 mg IV followed by Propofol 80 mg IV for sedation. Synchronous cardioversion was performed at 120joules.  The cardioversion was successful     Complications: No apparent complications Patient didtolerate procedure well.  Patient Profile     76 y.o.femalewith a hx of HTN, HL, OSA, GERD, Barrett's esophagus, atrial fibrillation. She was seen in the office 10/31/16 for dyspnea and weight gain. Found to be in AF RVR, sent to the hospital for admission and DCCV.   Assessment & Plan    1. AF with RVR - Noted  to be in AF while in the office 10/31/16, sent for direct admission for DCCV. -  Pt was successfully cardioverted back to [facility] today. HR currently in the 70s on telemetry - Currently on Toprol XL 25mg   - Continue Anticoagulation. Currently Xarelto 20 mg QD. May need to change NOAC due to cost.  - TSH was normal  - Give supplemental K today for hypokalemia. K is 3.2. Give 40 mEq of K-dur.  - We will arrange post hospital f/u in 1-2 weeks for repeat EKG to ensure that she is still in NSR  2. Acute Systolic CHF - volume overloaded in the setting of AF with RVR. - Echo shows reduced EF of 30% with diffuse hypokinesis (previously 55-60%). 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 Can consider repeat limited echo in 1-2 months, now that she is back in NSR to reassess LVF. Cardiac enzymes were negative x 3. Can consider outpatient NST to r/o ischemia.  -- She has had a good response to IV Lasix. -2.7L out since admit. Breathing improved. D/c supplemental O2 and have pt ambulate halls prior to discharge to see how she does.  -- Supplement K.   3. Aortic insufficiency - Moderate AI noted on TTE. TEE performed today, note not yet complete.    4. Chest pain - No further episodes, she has ruled out for MI.   5. Medication Assistance: pt currently on Xarelto. She notes she received a letter in the mail stating that her insurance will no longer cover Xarelto in 2018. Her Co-pay will be $400/month. Will ask case manager to check insurance to see if she can switch to Eliquis. May need Coumadin if unable to afford. Given her age, weight and renal function, would recommend full dose Eliquis, 5 mg BID.   Signed, Lyda Jester, PA-C  11/02/2016, 9:21 AM    Personally seen and examined. Agree with above. OK with DC after lunch. Spoke with her and husband.  EF 30% reduced. Possibly tachy mediated.  On Bb. BP low, try to add ACE or ARB as outpatient.  Recommend repeating again in 2  months.  If remains low, consider cath.  Has Barrett's esophagus tx at [facility]. Will have to remain on anticoagulation at the minimum of 4 weeks post cardioversion prior to holding for treatment.   Candee Furbish,  MD   

## 2016-11-03 ENCOUNTER — Encounter (HOSPITAL_COMMUNITY): Payer: Self-pay | Admitting: Cardiovascular Disease

## 2016-11-12 ENCOUNTER — Telehealth: Payer: Self-pay | Admitting: Cardiovascular Disease

## 2016-11-12 ENCOUNTER — Ambulatory Visit (INDEPENDENT_AMBULATORY_CARE_PROVIDER_SITE_OTHER): Payer: Medicare HMO | Admitting: Physician Assistant

## 2016-11-12 ENCOUNTER — Encounter (HOSPITAL_COMMUNITY): Payer: Self-pay | Admitting: Nurse Practitioner

## 2016-11-12 ENCOUNTER — Encounter: Payer: Self-pay | Admitting: Physician Assistant

## 2016-11-12 ENCOUNTER — Inpatient Hospital Stay (HOSPITAL_COMMUNITY)
Admission: AD | Admit: 2016-11-12 | Discharge: 2016-11-20 | DRG: 291 | Disposition: A | Discharge status: Home / Self Care | Payer: Medicare HMO | Source: Ambulatory Visit | Attending: Internal Medicine | Admitting: Internal Medicine

## 2016-11-12 VITALS — BP 132/82 | HR 128 | Ht 62.0 in | Wt 196.1 lb

## 2016-11-12 DIAGNOSIS — N183 Chronic kidney disease, stage 3 (moderate): Secondary | ICD-10-CM | POA: Diagnosis present

## 2016-11-12 DIAGNOSIS — Z79899 Other long term (current) drug therapy: Secondary | ICD-10-CM | POA: Diagnosis not present

## 2016-11-12 DIAGNOSIS — I4891 Unspecified atrial fibrillation: Secondary | ICD-10-CM

## 2016-11-12 DIAGNOSIS — I428 Other cardiomyopathies: Secondary | ICD-10-CM | POA: Diagnosis present

## 2016-11-12 DIAGNOSIS — R002 Palpitations: Secondary | ICD-10-CM | POA: Diagnosis not present

## 2016-11-12 DIAGNOSIS — F329 Major depressive disorder, single episode, unspecified: Secondary | ICD-10-CM | POA: Diagnosis present

## 2016-11-12 DIAGNOSIS — K449 Diaphragmatic hernia without obstruction or gangrene: Secondary | ICD-10-CM | POA: Diagnosis present

## 2016-11-12 DIAGNOSIS — I4892 Unspecified atrial flutter: Secondary | ICD-10-CM | POA: Diagnosis present

## 2016-11-12 DIAGNOSIS — I10 Essential (primary) hypertension: Secondary | ICD-10-CM | POA: Diagnosis present

## 2016-11-12 DIAGNOSIS — I480 Paroxysmal atrial fibrillation: Secondary | ICD-10-CM | POA: Diagnosis present

## 2016-11-12 DIAGNOSIS — F410 Panic disorder [episodic paroxysmal anxiety] without agoraphobia: Secondary | ICD-10-CM | POA: Diagnosis not present

## 2016-11-12 DIAGNOSIS — I493 Ventricular premature depolarization: Secondary | ICD-10-CM | POA: Diagnosis not present

## 2016-11-12 DIAGNOSIS — I080 Rheumatic disorders of both mitral and aortic valves: Secondary | ICD-10-CM | POA: Diagnosis present

## 2016-11-12 DIAGNOSIS — G4733 Obstructive sleep apnea (adult) (pediatric): Secondary | ICD-10-CM | POA: Diagnosis present

## 2016-11-12 DIAGNOSIS — K219 Gastro-esophageal reflux disease without esophagitis: Secondary | ICD-10-CM | POA: Diagnosis present

## 2016-11-12 DIAGNOSIS — E039 Hypothyroidism, unspecified: Secondary | ICD-10-CM | POA: Diagnosis present

## 2016-11-12 DIAGNOSIS — I351 Nonrheumatic aortic (valve) insufficiency: Secondary | ICD-10-CM

## 2016-11-12 DIAGNOSIS — I130 Hypertensive heart and chronic kidney disease with heart failure and stage 1 through stage 4 chronic kidney disease, or unspecified chronic kidney disease: Principal | ICD-10-CM | POA: Diagnosis present

## 2016-11-12 DIAGNOSIS — E785 Hyperlipidemia, unspecified: Secondary | ICD-10-CM | POA: Diagnosis present

## 2016-11-12 DIAGNOSIS — Z7901 Long term (current) use of anticoagulants: Secondary | ICD-10-CM | POA: Diagnosis not present

## 2016-11-12 DIAGNOSIS — Z888 Allergy status to other drugs, medicaments and biological substances status: Secondary | ICD-10-CM

## 2016-11-12 DIAGNOSIS — F419 Anxiety disorder, unspecified: Secondary | ICD-10-CM | POA: Diagnosis present

## 2016-11-12 DIAGNOSIS — K2270 Barrett's esophagus without dysplasia: Secondary | ICD-10-CM | POA: Diagnosis present

## 2016-11-12 DIAGNOSIS — I429 Cardiomyopathy, unspecified: Secondary | ICD-10-CM | POA: Diagnosis present

## 2016-11-12 DIAGNOSIS — I5043 Acute on chronic combined systolic (congestive) and diastolic (congestive) heart failure: Secondary | ICD-10-CM

## 2016-11-12 DIAGNOSIS — I5022 Chronic systolic (congestive) heart failure: Secondary | ICD-10-CM | POA: Diagnosis present

## 2016-11-12 DIAGNOSIS — I340 Nonrheumatic mitral (valve) insufficiency: Secondary | ICD-10-CM | POA: Diagnosis not present

## 2016-11-12 HISTORY — DX: Chronic systolic (congestive) heart failure: I50.22

## 2016-11-12 LAB — BASIC METABOLIC PANEL
ANION GAP: 10 (ref 5–15)
BUN: 13 mg/dL (ref 6–20)
CHLORIDE: 103 mmol/L (ref 101–111)
CO2: 26 mmol/L (ref 22–32)
Calcium: 9.8 mg/dL (ref 8.9–10.3)
Creatinine, Ser: 1.08 mg/dL — ABNORMAL HIGH (ref 0.44–1.00)
GFR calc Af Amer: 56 mL/min — ABNORMAL LOW (ref >60)
GFR calc non Af Amer: 48 mL/min — ABNORMAL LOW (ref >60)
GLUCOSE: 103 mg/dL — AB (ref 65–99)
Potassium: 4 mmol/L (ref 3.5–5.1)
Sodium: 139 mmol/L (ref 135–145)

## 2016-11-12 LAB — TSH: TSH: 0.036 u[IU]/mL — AB (ref 0.350–4.500)

## 2016-11-12 LAB — MAGNESIUM: Magnesium: 1.6 mg/dL — ABNORMAL LOW (ref 1.7–2.4)

## 2016-11-12 LAB — BRAIN NATRIURETIC PEPTIDE: B Natriuretic Peptide: 769.8 pg/mL — ABNORMAL HIGH (ref 0.0–100.0)

## 2016-11-12 MED ORDER — FLUOXETINE HCL 20 MG PO CAPS
40.0000 mg | ORAL_CAPSULE | Freq: Every day | ORAL | Status: DC
  Administered 2016-11-13 – 2016-11-20 (×8): 40 mg via ORAL
  Filled 2016-11-12 (×8): qty 2

## 2016-11-12 MED ORDER — ACETAMINOPHEN 325 MG PO TABS: 650.0000 mg | ORAL_TABLET | ORAL | Status: DC | PRN

## 2016-11-12 MED ORDER — LEVOTHYROXINE SODIUM 137 MCG PO TABS
205.5000 ug | ORAL_TABLET | Freq: Every day | ORAL | Status: DC
  Administered 2016-11-13 – 2016-11-14 (×2): 205.5 ug via ORAL
  Filled 2016-11-12 (×2): qty 1.5

## 2016-11-12 MED ORDER — PANTOPRAZOLE SODIUM 40 MG PO TBEC
40.0000 mg | DELAYED_RELEASE_TABLET | Freq: Every day | ORAL | Status: DC
  Administered 2016-11-13 – 2016-11-20 (×8): 40 mg via ORAL
  Filled 2016-11-12 (×8): qty 1

## 2016-11-12 MED ORDER — FERROUS SULFATE 325 (65 FE) MG PO TABS
325.0000 mg | ORAL_TABLET | Freq: Three times a day (TID) | ORAL | Status: DC
  Administered 2016-11-13 – 2016-11-14 (×2): 325 mg via ORAL
  Filled 2016-11-12 (×3): qty 1

## 2016-11-12 MED ORDER — POTASSIUM CHLORIDE CRYS ER 20 MEQ PO TBCR
20.0000 meq | EXTENDED_RELEASE_TABLET | Freq: Two times a day (BID) | ORAL | Status: DC
  Administered 2016-11-12 – 2016-11-20 (×16): 20 meq via ORAL
  Filled 2016-11-12 (×17): qty 1

## 2016-11-12 MED ORDER — FUROSEMIDE 10 MG/ML IJ SOLN
40.0000 mg | Freq: Two times a day (BID) | INTRAMUSCULAR | Status: DC
  Administered 2016-11-12 – 2016-11-15 (×7): 40 mg via INTRAVENOUS
  Filled 2016-11-12 (×7): qty 4

## 2016-11-12 MED ORDER — ONDANSETRON HCL 4 MG/2ML IJ SOLN: 4.0000 mg | Freq: Four times a day (QID) | INTRAMUSCULAR | Status: DC | PRN

## 2016-11-12 MED ORDER — RIVAROXABAN 15 MG PO TABS
15.0000 mg | ORAL_TABLET | Freq: Every day | ORAL | Status: DC
  Administered 2016-11-12 – 2016-11-19 (×8): 15 mg via ORAL
  Filled 2016-11-12 (×8): qty 1

## 2016-11-12 MED ORDER — METOPROLOL TARTRATE 25 MG PO TABS
25.0000 mg | ORAL_TABLET | Freq: Two times a day (BID) | ORAL | Status: DC
  Administered 2016-11-12 – 2016-11-15 (×6): 25 mg via ORAL
  Filled 2016-11-12 (×6): qty 1

## 2016-11-12 MED ORDER — METOPROLOL TARTRATE 5 MG/5ML IV SOLN
2.5000 mg | INTRAVENOUS | Status: DC | PRN
  Administered 2016-11-12 (×3): 2.5 mg via INTRAVENOUS
  Filled 2016-11-12 (×2): qty 5

## 2016-11-12 MED ORDER — LORATADINE 10 MG PO TABS
10.0000 mg | ORAL_TABLET | Freq: Every day | ORAL | Status: DC
  Administered 2016-11-13 – 2016-11-20 (×8): 10 mg via ORAL
  Filled 2016-11-12 (×8): qty 1

## 2016-11-12 MED ORDER — LEVOTHYROXINE SODIUM 75 MCG PO TABS: 205.5000 ug | ORAL_TABLET | Freq: Every day | ORAL | Status: DC

## 2016-11-12 NOTE — Telephone Encounter (Signed)
New message  Pt c/o Shortness Of Breath: STAT if SOB developed within the last 24 hours or pt is noticeably SOB on the phone  1. Are you currently SOB (can you hear that pt is SOB on the phone)? No   2. How long have you been experiencing SOB? Per pt since before last appt with scott weaver 10/31/16  3. Are you SOB when sitting or when up moving around? Moving around. Pt states she walks short distance to the rest room and is sob after the walk.  4. Are you currently experiencing any other symptoms? Feet and stomach swelling.   Per pt would like to be seen today if possible. appt was made for 2/7 with scott. Please call back to discuss

## 2016-11-12 NOTE — Telephone Encounter (Signed)
Called patient back about her message. Patient complaining of SOB and swelling. Patient states that she can hardly sleep at night due to SOB. Patient stated she was SOB at her last office visit and it's about the same. Patient stated she has swelling in her feet and her abdomen.  Patient stated she has gained weight since she was discharged from the hospital 11/02/16. Patient stated she was 193.6 lbs on 1/26 and is now is 198.2 lbs. Patient states she had not been eating as much either. Patient states she remembers receiving Lasix twice in the hospital, but does not take a diuretic regularly. Consulted Richardson Dopp PA, who saw patient at last visit. He recommended patient be seen today. Ermalinda Barrios PA had an opening on her schedule, patient will come in today at 2:15 pm. Patient verbalized understanding.

## 2016-11-12 NOTE — Progress Notes (Signed)
Cardiology Office Note    Date:  11/12/2016   ID:  Teresa York, DOB 04/28/1941, MRN [id number]  PCP:  Maylon Peppers, MD  Cardiologist: [place]  Chief Complaint  Patient presents with  . Shortness of Breath    History of Present Illness:  Teresa York is a 76 y.o. female with a hx of HTN, HL, OSA, GERD, Barrett's esophagus, atrial fibrillation.  She has been managed with Xarelto for anticoagulation.  Last seen by Dr. Lauree Chandler in 9/17.   The patient saw Richardson Dopp, [place] 10/31/16 and was in atrial fibrillation with RVR and CHF. She was admitted to the hospital and diuresed with IV Lasix. 2-D echo was done in LVEF has significantly decreased from 55-60% to 30-35% felt partially underestimated secondary to A. fib. Patient was on Xarelto and underwent successful cardioversion. Plan was to repeat echo in 2 months and if it remains low consider cardiac catheterization. Also needs Barrett's esophagus treatment at [facility] but has to remain on anticoagulation for a minimum of 4 weeks post cardioversion. She diuresed 2.7 L overnight.  Patient was added onto my schedule today because of worsening dyspnea on exertion. She said her breathing never improved after discharge. She was not discharged on a diuretic. She thinks her heart went back into A. fib 3 days later. Her biggest complaint is she can only walk about 10 feet before she has to stop because of shortness of breath.     Past Medical History:  Diagnosis Date  . Anxiety   . Arthritis    "generalized" (10/31/2016)  . Atrial fibrillation with RVR ([place]) 10/31/2016  . Barrett's esophagus   . Breast cancer ([place]) 2005   "left"  . Chronic lower back pain   . Depression   . Diverticulosis   . GERD (gastroesophageal reflux disease)   . Heart murmur    "when I was a child"  . Hiatal hernia   . Hypertension   . Hypothyroidism   . OAB (overactive bladder)   . Pneumonia    "several times" (10/31/2016)  . Seasonal allergies   .  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)    Past Surgical History:  Procedure Laterality Date  . BACK SURGERY    . BREAST BIOPSY Left 2005  . BREAST LUMPECTOMY Left 2005  . CARDIOVERSION N/A 11/02/2016   Procedure: CARDIOVERSION;  Surgeon: Thayer Headings, MD;  Location: [place];  Service: Cardiovascular;  Laterality: N/A;  . CARPAL TUNNEL RELEASE Bilateral 1995  . COLONOSCOPY    . ELBOW FRACTURE SURGERY Right 2009   with implant  . ESOPHAGOGASTRODUODENOSCOPY    . ESOPHAGUS SURGERY     "in the process of getting cancerous cell off my esophagus" (10/31/2016)  . FRACTURE SURGERY    . LUMBAR DISC SURGERY     L5  . MOHS SURGERY     "corner of right eye; on left cheek"  . TONSILLECTOMY    . TUBAL LIGATION  1980    Current Medications: Outpatient Medications Prior to Visit  Medication Sig Dispense Refill  . cetirizine (ZYRTEC) 10 MG tablet Take 10 mg by mouth daily. Reported on 10/27/2015    . Cholecalciferol (D 1000) 1000 units capsule Take 1,000 Units by mouth daily. Reported on 11/03/2015    . ferrous sulfate (SM IRON) 325 (65 FE) MG tablet Take 325 mg by mouth.    Marland Kitchen FLUoxetine (PROZAC) 40 MG capsule Take 40 mg by mouth daily.     Marland Kitchen  levothyroxine (SYNTHROID, LEVOTHROID) 137 MCG tablet Take 205.5 mcg by mouth daily. Take 1 & 1/2 tablets by mouth daily    . metoprolol succinate (TOPROL-XL) 25 MG 24 hr tablet TAKE ONE TABLET BY MOUTH ONCE DAILY 30 tablet 11  . omeprazole (PRILOSEC) 40 MG capsule Take 40 mg by mouth 2 (two) times daily.    . rivaroxaban (XARELTO) 20 MG TABS tablet TAKE ONE TABLET BY MOUTH ONCE DAILY WITH SUPPER 30 tablet 11  . Thiamine HCl (VITAMIN B-1 PO) Take 1 tablet by mouth daily.    . traZODone (DESYREL) 150 MG tablet Take 75 mg by mouth as needed for sleep.     Marland Kitchen zolpidem (AMBIEN) 5 MG tablet Take 5 mg by mouth at bedtime as needed for sleep.     Marland Kitchen trimethoprim (TRIMPEX) 100 MG tablet Take 100 mg by mouth daily.      No  facility-administered medications prior to visit.      Allergies:   Benazepril; Diltiazem; and Diltiazem hcl   Social History   Social History  . Marital status: Married    Spouse name: N/A  . Number of children: 2  . Years of education: N/A   Occupational History  . Retired Retired   Social History Main Topics  . Smoking status: Never Smoker  . Smokeless tobacco: Never Used  . Alcohol use No  . Drug use: No  . Sexual activity: Not Currently   Other Topics Concern  . None   Social History Narrative  . None     Family History:  The patient's family history includes Heart attack (age of onset: 43) in her father.   ROS:   Please see the history of present illness.    Review of Systems  Constitution: Positive for decreased appetite, weakness, malaise/fatigue and weight gain.  HENT: Negative.   Eyes: Negative.   Cardiovascular: Negative.   Respiratory: Positive for wheezing.   Hematologic/Lymphatic: Bruises/bleeds easily.  Musculoskeletal: Positive for back pain. Negative for joint pain.  Gastrointestinal: Positive for bloating and abdominal pain.  Genitourinary: Negative.   Neurological: Positive for headaches and loss of balance.   All other systems reviewed and are negative.   PHYSICAL EXAM:   VS:  BP 132/82   Pulse (!) 128   Ht 5\' 2"  (1.575 m)   Wt 196 lb 1.9 oz (89 kg)   LMP  (LMP Unknown)   BMI 35.87 kg/m   Physical Exam  GEN: Obese, in no acute distress  HEENT: normal  Neck: Increased JVD, no carotid bruits, or masses Cardiac: Irregular irregular at 123XX123 bpm 1/6 systolic murmur at the left sternal border Respiratory: Decreased breath sounds at bases otherwise clear  GI: soft, nontender, nondistended, + BS Ext: +2-3 edema without cyanosis, clubbing,  Good distal pulses bilaterally MS: no deformity or atrophy  Skin: warm and dry, no rash Neuro:  Alert and Oriented x 3, Strength and sensation are intact Psych: euthymic mood, full affect  Wt Readings  from Last 3 Encounters:  11/12/16 196 lb 1.9 oz (89 kg)  11/02/16 193 lb 3.2 oz (87.6 kg)  10/31/16 197 lb 6.4 oz (89.5 kg)      Studies/Labs Reviewed:   EKG:  EKG is  ordered today.  The ekg ordered today demonstratesAtrial fibrillation at 128 bpm  Recent Labs: 10/31/2016: ALT 15; Hemoglobin 12.0; Platelets 242; TSH 0.067 11/01/2016: BUN 13; Creatinine, Ser 1.18; Potassium 3.2; Sodium 142   Lipid Panel No results found for: CHOL, TRIG, HDL, CHOLHDL,  VLDL, LDLCALC, LDLDIRECT  Additional studies/ records that were reviewed today include:   2D Echo 11/01/16 Study Conclusions   - Left ventricle: The cavity size was normal. There was moderate   concentric hypertrophy. Systolic function was moderately to   severely reduced. The estimated ejection fraction was 30%.   Diffuse hypokinesis. The study was not technically sufficient to   allow evaluation of LV diastolic dysfunction due to atrial   fibrillation. - Aortic valve: There was moderate regurgitation. - Aortic root: The aortic root was normal in size. - Ascending aorta: The ascending aorta was normal in size. - Mitral valve: There was moderate regurgitation. - Left atrium: The atrium was moderately dilated. - Right ventricle: Systolic function was mildly reduced. - Right atrium: The atrium was mildly dilated. - Pulmonary arteries: Systolic pressure was mildly increased. PA   peak pressure: 35 mm Hg (S). - Inferior vena cava: The vessel was normal in size. - Pericardium, extracardiac: There was no pericardial effusion.   There was a left pleural effusion.   Impressions:   - When compared to the prior study from 03/09/2015 LVEF has   significantly decreased from 55-60% to 30-35%. LVEF is partially   underestimated secondary to atrial fibrillation with RVR during   the acquisition.    ASSESSMENT:    1. Atrial fibrillation with RVR ([place])   2. Acute on chronic combined systolic and diastolic CHF (congestive heart failure)  ([place])   3. Aortic valve insufficiency, etiology of cardiac valve disease unspecified      PLAN:  In order of problems listed above:  Atrial fibrillation with RVR after recent cardioversion last week. Patient is on Xarelto and hasn't missed any doses. Will admit for better rate control and begin amiodarone.   Acute on chronic combined systolic and diastolic CHF. Recent 2-D echo LVEF is now 30-35% probably underestimated secondary to A. fib. She says her baseline weight is about 185 pounds. She is 196 pounds today. She was discharged at 193 pounds. She was not sent home on a diuretic. Most likely will need. We'll begin IV Lasix  Aortic insufficiency moderate on recent echo    Medication Adjustments/Labs and Tests Ordered: Current medicines are reviewed at length with the patient today.  Concerns regarding medicines are outlined above.  Medication changes, Labs and Tests ordered today are listed in the Patient Instructions below. There are no Patient Instructions on file for this visit.   Signed, Ermalinda Barrios, PA-C  11/12/2016 2:56 PM    [facility] [address], [place], [place]  [address] Phone: [phone]; Fax: [phone]

## 2016-11-12 NOTE — Telephone Encounter (Deleted)
error 

## 2016-11-12 NOTE — Patient Instructions (Signed)
You are being admitted to [facility].   Please report to the [facility] and you will be taken to your room.

## 2016-11-12 NOTE — Progress Notes (Signed)
Patient arrived to 3e16 via wheelchair.  Patient alert and oriented and educated to room, call bell, and phone.  Patient has no complaints of pain, just shortness of breath while moving.  Will continue to monitor.

## 2016-11-12 NOTE — H&P (Signed)
Cardiology Office Note    Date:  11/12/2016   ID:  Azhane Hansler, DOB [birth year], MRN [id number]  PCP:  Maylon Peppers, MD  Cardiologist: [place]  Chief Complaint  Patient presents with  . Shortness of Breath    History of Present Illness:  Alainnah Warne is a 76 y.o. female with a hx of HTN, HL, OSA, GERD, Barrett's esophagus, atrial fibrillation.  She has been managed with Xarelto for anticoagulation.  Last seen by Dr. Lauree Chandler in 9/17.   The patient saw Richardson Dopp, [place] 10/31/16 and was in atrial fibrillation with RVR and CHF. She was admitted to the hospital and diuresed with IV Lasix. 2-D echo was done in LVEF has significantly decreased from 55-60% to 30-35% felt partially underestimated secondary to A. fib. Patient was on Xarelto and underwent successful cardioversion. Plan was to repeat echo in 2 months and if it remains low consider cardiac catheterization. Also needs Barrett's esophagus treatment at [facility] but has to remain on anticoagulation for a minimum of 4 weeks post cardioversion. She diuresed 2.7 L overnight.  Patient was added onto my schedule today because of worsening dyspnea on exertion. She said her breathing never improved after discharge. She was not discharged on a diuretic. She thinks her heart went back into A. fib 3 days later. Her biggest complaint is she can only walk about 10 feet before she has to stop because of shortness of breath.     Past Medical History:  Diagnosis Date  . Anxiety   . Arthritis    "generalized" (10/31/2016)  . Atrial fibrillation with RVR ([place]) 10/31/2016  . Barrett's esophagus   . Breast cancer ([place]) 2005   "left"  . Chronic lower back pain   . Depression   . Diverticulosis   . GERD (gastroesophageal reflux disease)   . Heart murmur    "when I was a child"  . Hiatal hernia   . Hypertension   . Hypothyroidism   . OAB (overactive bladder)   . Pneumonia    "several times" (10/31/2016)  . Seasonal allergies   .  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)    Past Surgical History:  Procedure Laterality Date  . BACK SURGERY    . BREAST BIOPSY Left 2005  . BREAST LUMPECTOMY Left 2005  . CARDIOVERSION N/A 11/02/2016   Procedure: CARDIOVERSION;  Surgeon: Thayer Headings, MD;  Location: [facility];  Service: Cardiovascular;  Laterality: N/A;  . CARPAL TUNNEL RELEASE Bilateral 1995  . COLONOSCOPY    . ELBOW FRACTURE SURGERY Right 2009   with implant  . ESOPHAGOGASTRODUODENOSCOPY    . ESOPHAGUS SURGERY     "in the process of getting cancerous cell off my esophagus" (10/31/2016)  . FRACTURE SURGERY    . LUMBAR DISC SURGERY     L5  . MOHS SURGERY     "corner of right eye; on left cheek"  . TONSILLECTOMY    . TUBAL LIGATION  1980    Current Medications: Outpatient Medications Prior to Visit  Medication Sig Dispense Refill  . cetirizine (ZYRTEC) 10 MG tablet Take 10 mg by mouth daily. Reported on 10/27/2015    . Cholecalciferol (D 1000) 1000 units capsule Take 1,000 Units by mouth daily. Reported on 11/03/2015    . ferrous sulfate (SM IRON) 325 (65 FE) MG tablet Take 325 mg by mouth.    Marland Kitchen FLUoxetine (PROZAC) 40 MG capsule Take 40 mg by mouth daily.     Marland Kitchen  levothyroxine (SYNTHROID, LEVOTHROID) 137 MCG tablet Take 205.5 mcg by mouth daily. Take 1 & 1/2 tablets by mouth daily    . metoprolol succinate (TOPROL-XL) 25 MG 24 hr tablet TAKE ONE TABLET BY MOUTH ONCE DAILY 30 tablet 11  . omeprazole (PRILOSEC) 40 MG capsule Take 40 mg by mouth 2 (two) times daily.    . rivaroxaban (XARELTO) 20 MG TABS tablet TAKE ONE TABLET BY MOUTH ONCE DAILY WITH SUPPER 30 tablet 11  . Thiamine HCl (VITAMIN B-1 PO) Take 1 tablet by mouth daily.    . traZODone (DESYREL) 150 MG tablet Take 75 mg by mouth as needed for sleep.     Marland Kitchen zolpidem (AMBIEN) 5 MG tablet Take 5 mg by mouth at bedtime as needed for sleep.     Marland Kitchen trimethoprim (TRIMPEX) 100 MG tablet Take 100 mg by mouth daily.      No  facility-administered medications prior to visit.      Allergies:   Benazepril; Diltiazem; and Diltiazem hcl   Social History   Social History  . Marital status: Married    Spouse name: N/A  . Number of children: 2  . Years of education: N/A   Occupational History  . Retired Retired   Social History Main Topics  . Smoking status: Never Smoker  . Smokeless tobacco: Never Used  . Alcohol use No  . Drug use: No  . Sexual activity: Not Currently   Other Topics Concern  . None   Social History Narrative  . None     Family History:  The patient's family history includes Heart attack (age of onset: 5) in her father.   ROS:   Please see the history of present illness.    Review of Systems  Constitution: Positive for decreased appetite, weakness, malaise/fatigue and weight gain.  HENT: Negative.   Eyes: Negative.   Cardiovascular: Negative.   Respiratory: Positive for wheezing.   Hematologic/Lymphatic: Bruises/bleeds easily.  Musculoskeletal: Positive for back pain. Negative for joint pain.  Gastrointestinal: Positive for bloating and abdominal pain.  Genitourinary: Negative.   Neurological: Positive for headaches and loss of balance.   All other systems reviewed and are negative.   PHYSICAL EXAM:   VS:  BP 132/82   Pulse (!) 128   Ht 5\' 2"  (1.575 m)   Wt 196 lb 1.9 oz (89 kg)   LMP  (LMP Unknown)   BMI 35.87 kg/m   Physical Exam  GEN: Obese, in no acute distress  HEENT: normal  Neck: Increased JVD, no carotid bruits, or masses Cardiac: Irregular irregular at 123XX123 bpm 1/6 systolic murmur at the left sternal border Respiratory: Decreased breath sounds at bases otherwise clear  GI: soft, nontender, nondistended, + BS Ext: +2-3 edema without cyanosis, clubbing,  Good distal pulses bilaterally MS: no deformity or atrophy  Skin: warm and dry, no rash Neuro:  Alert and Oriented x 3, Strength and sensation are intact Psych: euthymic mood, full affect  Wt Readings  from Last 3 Encounters:  11/12/16 196 lb 1.9 oz (89 kg)  11/02/16 193 lb 3.2 oz (87.6 kg)  10/31/16 197 lb 6.4 oz (89.5 kg)      Studies/Labs Reviewed:   EKG:  EKG is  ordered today.  The ekg ordered today demonstratesAtrial fibrillation at 128 bpm  Recent Labs: 10/31/2016: ALT 15; Hemoglobin 12.0; Platelets 242; TSH 0.067 11/01/2016: BUN 13; Creatinine, Ser 1.18; Potassium 3.2; Sodium 142   Lipid Panel No results found for: CHOL, TRIG, HDL, CHOLHDL,  VLDL, LDLCALC, LDLDIRECT  Additional studies/ records that were reviewed today include:   2D Echo 11/01/16 Study Conclusions   - Left ventricle: The cavity size was normal. There was moderate   concentric hypertrophy. Systolic function was moderately to   severely reduced. The estimated ejection fraction was 30%.   Diffuse hypokinesis. The study was not technically sufficient to   allow evaluation of LV diastolic dysfunction due to atrial   fibrillation. - Aortic valve: There was moderate regurgitation. - Aortic root: The aortic root was normal in size. - Ascending aorta: The ascending aorta was normal in size. - Mitral valve: There was moderate regurgitation. - Left atrium: The atrium was moderately dilated. - Right ventricle: Systolic function was mildly reduced. - Right atrium: The atrium was mildly dilated. - Pulmonary arteries: Systolic pressure was mildly increased. PA   peak pressure: 35 mm Hg (S). - Inferior vena cava: The vessel was normal in size. - Pericardium, extracardiac: There was no pericardial effusion.   There was a left pleural effusion.   Impressions:   - When compared to the prior study from 03/09/2015 LVEF has   significantly decreased from 55-60% to 30-35%. LVEF is partially   underestimated secondary to atrial fibrillation with RVR during   the acquisition.    ASSESSMENT:    1. Atrial fibrillation with RVR ([place])   2. Acute on chronic combined systolic and diastolic CHF (congestive heart failure)  ([place])   3. Aortic valve insufficiency, etiology of cardiac valve disease unspecified      PLAN:  In order of problems listed above:  Atrial fibrillation with RVR after recent cardioversion last week. Patient is on Xarelto and hasn't missed any doses. Will admit for better rate control and begin amiodarone.   Acute on chronic combined systolic and diastolic CHF. Recent 2-D echo LVEF is now 30-35% probably underestimated secondary to A. fib. She says her baseline weight is about 185 pounds. She is 196 pounds today. She was discharged at 193 pounds. She was not sent home on a diuretic. Most likely will need. We'll begin IV Lasix  Aortic insufficiency moderate on recent echo    Medication Adjustments/Labs and Tests Ordered: Current medicines are reviewed at length with the patient today.  Concerns regarding medicines are outlined above.  Medication changes, Labs and Tests ordered today are listed in the Patient Instructions below. There are no Patient Instructions on file for this visit.   Signed, Ermalinda Barrios, PA-C  11/12/2016 2:56 PM    [facility] [address], [place], [place]  [address] Phone: [phone]; Fax: [phone]

## 2016-11-13 DIAGNOSIS — I480 Paroxysmal atrial fibrillation: Secondary | ICD-10-CM

## 2016-11-13 LAB — BASIC METABOLIC PANEL
ANION GAP: 16 — AB (ref 5–15)
BUN: 15 mg/dL (ref 6–20)
CHLORIDE: 102 mmol/L (ref 101–111)
CO2: 21 mmol/L — ABNORMAL LOW (ref 22–32)
Calcium: 9.7 mg/dL (ref 8.9–10.3)
Creatinine, Ser: 1.15 mg/dL — ABNORMAL HIGH (ref 0.44–1.00)
GFR calc Af Amer: 52 mL/min — ABNORMAL LOW (ref >60)
GFR calc non Af Amer: 45 mL/min — ABNORMAL LOW (ref >60)
Glucose, Bld: 127 mg/dL — ABNORMAL HIGH (ref 65–99)
POTASSIUM: 3.6 mmol/L (ref 3.5–5.1)
SODIUM: 139 mmol/L (ref 135–145)

## 2016-11-13 MED ORDER — ZOLPIDEM TARTRATE 5 MG PO TABS: 5.0000 mg | ORAL_TABLET | Freq: Every evening | ORAL | Status: DC | PRN

## 2016-11-13 MED ORDER — AMIODARONE HCL 200 MG PO TABS
400.0000 mg | ORAL_TABLET | Freq: Two times a day (BID) | ORAL | Status: DC
  Administered 2016-11-13 – 2016-11-20 (×15): 400 mg via ORAL
  Filled 2016-11-13 (×16): qty 2

## 2016-11-13 MED ORDER — ZOLPIDEM TARTRATE 5 MG PO TABS: 10.0000 mg | ORAL_TABLET | Freq: Every evening | ORAL | Status: DC | PRN

## 2016-11-13 MED ORDER — ZOLPIDEM TARTRATE 5 MG PO TABS
5.0000 mg | ORAL_TABLET | Freq: Every evening | ORAL | Status: DC | PRN
  Administered 2016-11-13 – 2016-11-15 (×3): 5 mg via ORAL
  Filled 2016-11-13 (×3): qty 1

## 2016-11-13 NOTE — Progress Notes (Signed)
Pt slept on and off  during the night, Vitals stable, no any sign of SOB and distress noted, no any complain of pain, pulse was running between 90 to 120,  will continue to monitor the patient.

## 2016-11-13 NOTE — Progress Notes (Signed)
Progress Note  Patient Name: Teresa York Date of Encounter: 11/13/2016  Primary Cardiologist: [place] with dyspnea. No pain  Inpatient Medications    Scheduled Meds: . ferrous sulfate  325 mg Oral TID WC  . FLUoxetine  40 mg Oral Daily  . furosemide  40 mg Intravenous BID  . levothyroxine  205.5 mcg Oral QAC breakfast  . loratadine  10 mg Oral Daily  . metoprolol tartrate  25 mg Oral BID  . pantoprazole  40 mg Oral Daily  . potassium chloride  20 mEq Oral BID  . Rivaroxaban  15 mg Oral Q supper   Continuous Infusions:  PRN Meds: acetaminophen, metoprolol, ondansetron (ZOFRAN) IV   Vital Signs    Vitals:   11/13/16 0009 11/13/16 0500 11/13/16 0550 11/13/16 0857  BP: 127/77  118/64 (!) 143/110  Pulse: (!) 122 96 (!) 103 64  Resp: 18   18  Temp: 97.5 F (36.4 C)  99.1 F (37.3 C) 97.8 F (36.6 C)  TempSrc: Oral  Oral Oral  SpO2: 96%  98% 94%  Weight:   190 lb 11.2 oz (86.5 kg)   Height:        Intake/Output Summary (Last 24 hours) at 11/13/16 0924 Last data filed at 11/13/16 0847  Gross per 24 hour  Intake              360 ml  Output             2400 ml  Net            -2040 ml   Filed Weights   11/12/16 1544 11/13/16 0550  Weight: 197 lb 9.6 oz (89.6 kg) 190 lb 11.2 oz (86.5 kg)    Telemetry    Atrial fib, rate 100-110 - Personally Reviewed  ECG    Physical Exam   GEN: No acute distress.   Neck: No JVD Cardiac: irreg irreg, no murmurs, rubs, or gallops.  Respiratory: Clear to auscultation bilaterally. GI: Soft, nontender, non-distended  MS: 1+ bilateral LE edema; No deformity. Neuro:  Nonfocal  Psych: Normal affect   Labs    Chemistry Recent Labs Lab 11/12/16 1551  NA 139  K 4.0  CL 103  CO2 26  GLUCOSE 103*  BUN 13  CREATININE 1.08*  CALCIUM 9.8  GFRNONAA 48*  GFRAA 56*  ANIONGAP 10     No results for input(s): TROPIPOC in the last 168 hours.   BNP Recent Labs Lab 11/12/16 1551  BNP 769.8*      DDimer No results for input(s): DDIMER in the last 168 hours.   Radiology    No results found.  Cardiac Studies     Patient Profile     76 y.o. female with history of HTN, HLD, OSA, PAF and acute on chronic systolic and diastolic CHF admitted with volume overload and recurrent atrial fib with RVR. She was recently admitted to [facility] 10/31/16 with rapid atrial fib and CHF and was diuresed and cardioverted to sinus. Now with recurrent dyspnea, weight gain, LE edema, atrial fib  Assessment & Plan    1. Atrial fib with RVR: Rate is in the 100s this am. Still in atrial fib. Will continue metoprolol and Xarelto. Will add amiodarone 400 mg po BID.   2. Acute on chronic combined systolic and diastolic CHF: She is diuresing well on IV lasix. She is still volume overloaded. Will continue IV Lasix today. Check BMET this am.   3.  Cardiomyopathy: Unclear if this is related to her heart rate. May need ischemic evaluation if this does not improve with control of her heart rate/rhythm. Consider adding ARB or Ace-inh.   Signed, Lauree Chandler, MD  11/13/2016, 9:24 AM

## 2016-11-14 ENCOUNTER — Ambulatory Visit: Payer: Medicare HMO | Admitting: Physician Assistant

## 2016-11-14 LAB — BASIC METABOLIC PANEL
ANION GAP: 14 (ref 5–15)
BUN: 15 mg/dL (ref 6–20)
CHLORIDE: 99 mmol/L — AB (ref 101–111)
CO2: 25 mmol/L (ref 22–32)
Calcium: 9.4 mg/dL (ref 8.9–10.3)
Creatinine, Ser: 1.24 mg/dL — ABNORMAL HIGH (ref 0.44–1.00)
GFR calc Af Amer: 47 mL/min — ABNORMAL LOW (ref >60)
GFR calc non Af Amer: 41 mL/min — ABNORMAL LOW (ref >60)
GLUCOSE: 98 mg/dL (ref 65–99)
Potassium: 3.6 mmol/L (ref 3.5–5.1)
Sodium: 138 mmol/L (ref 135–145)

## 2016-11-14 LAB — CBC
HCT: 41.4 % (ref 36.0–46.0)
HEMOGLOBIN: 12.9 g/dL (ref 12.0–15.0)
MCH: 27.3 pg (ref 26.0–34.0)
MCHC: 31.2 g/dL (ref 30.0–36.0)
MCV: 87.7 fL (ref 78.0–100.0)
Platelets: 233 10*3/uL (ref 150–400)
RBC: 4.72 MIL/uL (ref 3.87–5.11)
RDW: 16.1 % — ABNORMAL HIGH (ref 11.5–15.5)
WBC: 7.1 10*3/uL (ref 4.0–10.5)

## 2016-11-14 MED ORDER — SODIUM CHLORIDE 0.9% FLUSH
3.0000 mL | INTRAVENOUS | Status: DC | PRN
  Administered 2016-11-19: 3 mL via INTRAVENOUS
  Filled 2016-11-14: qty 3

## 2016-11-14 MED ORDER — SODIUM CHLORIDE 0.9% FLUSH
3.0000 mL | Freq: Two times a day (BID) | INTRAVENOUS | Status: DC
  Administered 2016-11-14 – 2016-11-20 (×11): 3 mL via INTRAVENOUS

## 2016-11-14 MED ORDER — SODIUM CHLORIDE 0.9 % IV SOLN
250.0000 mL | INTRAVENOUS | Status: DC
  Administered 2016-11-16: 250 mL via INTRAVENOUS

## 2016-11-14 MED ORDER — AMIODARONE IV BOLUS ONLY 150 MG/100ML
150.0000 mg | Freq: Once | INTRAVENOUS | Status: AC
  Administered 2016-11-14: 150 mg via INTRAVENOUS
  Filled 2016-11-14: qty 100

## 2016-11-14 MED ORDER — FERROUS SULFATE 325 (65 FE) MG PO TABS
325.0000 mg | ORAL_TABLET | Freq: Every day | ORAL | Status: DC
  Administered 2016-11-15 – 2016-11-20 (×5): 325 mg via ORAL
  Filled 2016-11-14 (×5): qty 1

## 2016-11-14 MED ORDER — LEVOTHYROXINE SODIUM 75 MCG PO TABS
175.0000 ug | ORAL_TABLET | Freq: Every day | ORAL | Status: DC
  Administered 2016-11-15 – 2016-11-20 (×6): 175 ug via ORAL
  Filled 2016-11-14 (×6): qty 1

## 2016-11-14 NOTE — Progress Notes (Signed)
Progress Note  Patient Name: Teresa York Date of Encounter: 11/14/2016  Primary Cardiologist: Dr. Angelena York  Subjective   Pt states she finally had a good night last night and was able to lie flat without SOB. She has no complaints this morning. She denies chest pain, palpitations, dizziness, and lightheadedness.   Inpatient Medications    Scheduled Meds: . amiodarone  400 mg Oral BID  . ferrous sulfate  325 mg Oral TID WC  . FLUoxetine  40 mg Oral Daily  . furosemide  40 mg Intravenous BID  . levothyroxine  205.5 mcg Oral QAC breakfast  . loratadine  10 mg Oral Daily  . metoprolol tartrate  25 mg Oral BID  . pantoprazole  40 mg Oral Daily  . potassium chloride  20 mEq Oral BID  . Rivaroxaban  15 mg Oral Q supper   Continuous Infusions:  PRN Meds: acetaminophen, metoprolol, ondansetron (ZOFRAN) IV, zolpidem   Vital Signs    Vitals:   11/13/16 1215 11/13/16 2105 11/14/16 0121 11/14/16 0538  BP: 103/84 (!) 141/97 120/71 123/72  Pulse: (!) 110 (!) 117 (!) 104 (!) 103  Resp: 18 20 20 18   Temp: 97.4 F (36.3 C) 98 F (36.7 C) 98.3 F (36.8 C) 98.7 F (37.1 C)  TempSrc: Oral  Oral Oral  SpO2: 97% 98% 98% 96%  Weight:    189 lb 4.8 oz (85.9 kg)  Height:        Intake/Output Summary (Last 24 hours) at 11/14/16 0828 Last data filed at 11/14/16 [id number]  Gross per 24 hour  Intake              660 ml  Output             2250 ml  Net            -1590 ml   Filed Weights   11/12/16 1544 11/13/16 0550 11/14/16 0538  Weight: 197 lb 9.6 oz (89.6 kg) 190 lb 11.2 oz (86.5 kg) 189 lb 4.8 oz (85.9 kg)    Telemetry    Atrial fibrillation 100-120s - Personally Reviewed  ECG    No new tracings  Physical Exam   General: Well developed, well nourished, female appearing in no acute distress. Head: Normocephalic, atraumatic.  Neck: Supple without bruits, no JVD. Lungs:  Resp regular and unlabored, CTA. Heart: Irregular rate and rhythm, S1, S2, no murmur; no rub. Abdomen:  Soft, non-tender, non-distended with normoactive bowel sounds. No hepatomegaly. No rebound/guarding. No obvious abdominal masses. Extremities: No clubbing, cyanosis, Trace to 1+ edema. Distal pedal pulses are 2+ bilaterally. Neuro: Alert and oriented X 3. Moves all extremities spontaneously. Psych: Normal affect.  Labs    Chemistry Recent Labs Lab 11/12/16 1551 11/13/16 1014 11/14/16 0500  NA 139 139 138  K 4.0 3.6 3.6  CL 103 102 99*  CO2 26 21* 25  GLUCOSE 103* 127* 98  BUN 13 15 15   CREATININE 1.08* 1.15* 1.24*  CALCIUM 9.8 9.7 9.4  GFRNONAA 48* 45* 41*  GFRAA 56* 52* 47*  ANIONGAP 10 16* 14     Hematology Recent Labs Lab 11/14/16 0500  WBC 7.1  RBC 4.72  HGB 12.9  HCT 41.4  MCV 87.7  MCH 27.3  MCHC 31.2  RDW 16.1*  PLT 233    Cardiac EnzymesNo results for input(s): TROPONINI in the last 168 hours. No results for input(s): TROPIPOC in the last 168 hours.   BNP Recent Labs Lab 11/12/16 1551  BNP  769.8*     DDimer No results for input(s): DDIMER in the last 168 hours.   Radiology    No results found.  Cardiac Studies   Echo 11/01/16: Study Conclusions - Left ventricle: The cavity size was normal. There was moderate   concentric hypertrophy. Systolic function was moderately to   severely reduced. The estimated ejection fraction was 30%.   Diffuse hypokinesis. The study was not technically sufficient to   allow evaluation of LV diastolic dysfunction due to atrial   fibrillation. - Aortic valve: There was moderate regurgitation. - Aortic root: The aortic root was normal in size. - Ascending aorta: The ascending aorta was normal in size. - Mitral valve: There was moderate regurgitation. - Left atrium: The atrium was moderately dilated. - Right ventricle: Systolic function was mildly reduced. - Right atrium: The atrium was mildly dilated. - Pulmonary arteries: Systolic pressure was mildly increased. PA   peak pressure: 35 mm Hg (S). - Inferior  vena cava: The vessel was normal in size. - Pericardium, extracardiac: There was no pericardial effusion.   There was a left pleural effusion.  Impressions: - When compared to the prior study from 03/09/2015 LVEF has   significantly decreased from 55-60% to 30-35%. LVEF is partially   underestimated secondary to atrial fibrillation with RVR during   the acquisition.   Patient Profile     76 y.o. female with history of HTN, HLD, OSA, PAF and acute on chronic systolic and diastolic CHF admitted with volume overload and recurrent atrial fib with RVR. She was recently admitted to [facility] 10/31/16 with rapid atrial fib and CHF and was diuresed and cardioverted to sinus. Now with recurrent dyspnea, weight gain, LE edema, atrial fib.  Assessment & Plan    1. Atrial fib with RVR:  - Atrial fibrillation with rates in the 100-120s this am. Will continue metoprolol and Xarelto; continue amiodarone 400 mg po BID. Will discuss amiodarone bolus today for better rate control. - TSH has decreased: 0.036, was normal in 11/2013. Decreased synthroid dose 19mcg, per pharmacy.  2. Acute on chronic combined systolic and diastolic CHF:  - She is diuresing well on IV lasix: 2.2L and net negative 3.8L; weight 189 lbs (190 lbs) - She is still volume overloaded. Will continue IV Lasix today. - sCr 1.24 (1.15), continue to follow daily BMP; may consider transitioning IV lasix to PO dosing tomorrow  3. Cardiomyopathy:  - Unclear if this is related to her heart rate. May need ischemic evaluation if this does not improve with control of her heart rate/rhythm.  - BP 120s/70s; Consider adding ARB or Ace-inh.    Signed, Teresa York , PA-C 8:28 AM 11/14/2016   I have personally seen and examined this patient with Teresa Adas, PA-C. I agree with the assessment and plan as outlined above. She is diuresing well but still volume overloaded. Will continue IV Lasix. HR still elevated on Admiodarone and metoprolol. Will give  150 mg bolus IV amiodarone now. Plan cardioversion but schedule full tomorrow. Plan DCCV Friday. If LV function does not improve in sinus, may need right and left heart cath.   Teresa York 11/14/2016 10:34 AM

## 2016-11-15 MED ORDER — METOPROLOL TARTRATE 25 MG PO TABS
25.0000 mg | ORAL_TABLET | Freq: Three times a day (TID) | ORAL | Status: DC
  Administered 2016-11-15 – 2016-11-17 (×6): 25 mg via ORAL
  Filled 2016-11-15 (×6): qty 1

## 2016-11-15 NOTE — Progress Notes (Signed)
Progress Note  Patient Name: Teresa York Date of Encounter: 11/15/2016  Primary Cardiologist: Dr. [name]   Subjective   Feels a bit better. Breathing improved. Does not feel her afib. No chest pain.  Inpatient Medications    Scheduled Meds: . amiodarone  400 mg Oral BID  . ferrous sulfate  325 mg Oral Q breakfast  . FLUoxetine  40 mg Oral Daily  . furosemide  40 mg Intravenous BID  . levothyroxine  175 mcg Oral QAC breakfast  . loratadine  10 mg Oral Daily  . metoprolol tartrate  25 mg Oral BID  . pantoprazole  40 mg Oral Daily  . potassium chloride  20 mEq Oral BID  . Rivaroxaban  15 mg Oral Q supper  . sodium chloride flush  3 mL Intravenous Q12H   Continuous Infusions: . sodium chloride     PRN Meds: acetaminophen, metoprolol, ondansetron (ZOFRAN) IV, sodium chloride flush, zolpidem   Vital Signs    Vitals:   11/14/16 0538 11/14/16 1205 11/14/16 2017 11/15/16 0440  BP: 123/72 107/74 110/72 124/63  Pulse: (!) 103 (!) 126 (!) 113 (!) 116  Resp: 18 (!) 22 18 18   Temp: 98.7 F (37.1 C) 97.8 F (36.6 C)  97.6 F (36.4 C)  TempSrc: Oral Oral  Oral  SpO2: 96% 98% 95% 97%  Weight: 189 lb 4.8 oz (85.9 kg)   189 lb 1.6 oz (85.8 kg)  Height:        Intake/Output Summary (Last 24 hours) at 11/15/16 0907 Last data filed at 11/15/16 0527  Gross per 24 hour  Intake             1320 ml  Output              950 ml  Net              370 ml   Filed Weights   11/13/16 0550 11/14/16 0538 11/15/16 0440  Weight: 190 lb 11.2 oz (86.5 kg) 189 lb 4.8 oz (85.9 kg) 189 lb 1.6 oz (85.8 kg)    Telemetry    Atrial fibrillation w/ rates in the 110s-120s - Personally Reviewed    Physical Exam   GEN: No acute distress.   Neck: No JVD Cardiac: irreguarlly irregular, tachy rate no murmurs, rubs, or gallops.  Respiratory: Clear to auscultation bilaterally. GI: Soft, nontender, non-distended  MS: No edema; No deformity. Neuro:  Nonfocal  Psych: Normal affect   Labs      Chemistry Recent Labs Lab 11/12/16 1551 11/13/16 1014 11/14/16 0500  NA 139 139 138  K 4.0 3.6 3.6  CL 103 102 99*  CO2 26 21* 25  GLUCOSE 103* 127* 98  BUN 13 15 15   CREATININE 1.08* 1.15* 1.24*  CALCIUM 9.8 9.7 9.4  GFRNONAA 48* 45* 41*  GFRAA 56* 52* 47*  ANIONGAP 10 16* 14     Hematology Recent Labs Lab 11/14/16 0500  WBC 7.1  RBC 4.72  HGB 12.9  HCT 41.4  MCV 87.7  MCH 27.3  MCHC 31.2  RDW 16.1*  PLT 233    Cardiac EnzymesNo results for input(s): TROPONINI in the last 168 hours. No results for input(s): TROPIPOC in the last 168 hours.   BNP Recent Labs Lab 11/12/16 1551  BNP 769.8*     DDimer No results for input(s): DDIMER in the last 168 hours.   Radiology    No results found.   Patient Profile     76 y.o.femalewith  history of HTN, HLD, OSA, PAF and acute on chronic systolic and diastolic CHF admitted with volume overload and recurrent atrial fib with RVR. She was recently admitted to [facility] 10/31/16 with rapid atrial fib and CHF and was diuresed and cardioverted to sinus. Now with recurrent dyspnea, weight gain, LE edema, atrial fib.  Assessment & Plan    1. Atrial Fib with RVR:  - failed recent cardioversion. No on an AAD. Being loaded with Amiodarone.  -Atrial fibrillation with rates ranging from 80-120s with few PVC's overnight. Was giving amiodarone bolus yesterday. Levothyroxine dose decreased per pharmacy. On Metoprolol 25mg  po BID, amiodarone 400mg  po BID for rate control. On Xarelto 15mg  po qPM. Patient is asymptomatic.  - May consider repeat cardioversion after amiodarone load.   2. Acute on chronic combined systolic and diastolic CHF: -She is diuresing well overall, 3.5L down; weight down 8lbs since admit (197 -> 189). Dry weight 184 as of 06/22/16 office visit.  -She is +.37L yesterday and weight is stable. She does not feel like she urinated as much yesterday.  -On 2L by nasal cannula. Feels this has improved her breathing.  Continue.  -Scr 1.24 yesterday, no new value today. Follow BMP daily.  -Trace edema on exam. Will switch to PO lasix.   3. Cardiomyopathy -Still unclear of etiology. May be tachy mediated from her afib. Consider evaluation if does not improve with rate/rhythm control.  -BP has been soft (last diastolic 63). Will hold addition of ACE/ARB. Consider in future.  - May consider switch back to long acting metoprolol given systolic dysfunction.  4. HTN: -Has been soft. Continue current regimen  5. HLD: -Lipid panel on file from 07/2015 (care everywhere). LDL 145, Cholesterol 229. Will order. Not on medication for this.   Signed, Lyda Jester, PA-C  11/15/2016, 9:07 AM    I have personally seen and examined this patient with Lyda Jester, PA-C. I agree with the assessment and plan as outlined above. She remains in atrial fibrillation this am with uncontrolled rates in the 100s to 120s. She is on amiodarone 400 mg po BID and metoprolol 25 mg po BID. Will increase her metoprolol to 25 mg po TID. NPO at midnight for DCCV tomorrow with Dr. Harrington Challenger. She has been on Xarelto chronically. Last DCCV 11/02/16. Her volume status is much better today. Will switch to po Lasix.   Lauree Chandler 11/15/2016 10:20 AM

## 2016-11-15 NOTE — Care Management Important Message (Signed)
Important Message  Patient Details  Name: Teresa York MRN: [id number] Date of Birth: Jan 28, 1941   Medicare Important Message Given:  Yes    [name] Montine Circle 11/15/2016, 11:48 AM

## 2016-11-16 ENCOUNTER — Inpatient Hospital Stay (HOSPITAL_COMMUNITY): Payer: Medicare HMO | Admitting: Anesthesiology

## 2016-11-16 ENCOUNTER — Encounter (HOSPITAL_COMMUNITY)
Admission: AD | Disposition: A | Discharge status: Home / Self Care | Payer: Self-pay | Source: Ambulatory Visit | Attending: Internal Medicine

## 2016-11-16 ENCOUNTER — Encounter (HOSPITAL_COMMUNITY): Payer: Self-pay

## 2016-11-16 ENCOUNTER — Inpatient Hospital Stay (HOSPITAL_COMMUNITY): Payer: Medicare HMO

## 2016-11-16 DIAGNOSIS — I340 Nonrheumatic mitral (valve) insufficiency: Secondary | ICD-10-CM

## 2016-11-16 HISTORY — PX: TEE WITHOUT CARDIOVERSION: SHX5443

## 2016-11-16 HISTORY — PX: CARDIOVERSION: SHX1299

## 2016-11-16 LAB — BASIC METABOLIC PANEL
Anion gap: 15 (ref 5–15)
BUN: 24 mg/dL — ABNORMAL HIGH (ref 6–20)
CALCIUM: 9.3 mg/dL (ref 8.9–10.3)
CO2: 25 mmol/L (ref 22–32)
CREATININE: 1.67 mg/dL — AB (ref 0.44–1.00)
Chloride: 96 mmol/L — ABNORMAL LOW (ref 101–111)
GFR, EST AFRICAN AMERICAN: 33 mL/min — AB (ref >60)
GFR, EST NON AFRICAN AMERICAN: 28 mL/min — AB (ref >60)
Glucose, Bld: 160 mg/dL — ABNORMAL HIGH (ref 65–99)
Potassium: 3.6 mmol/L (ref 3.5–5.1)
SODIUM: 136 mmol/L (ref 135–145)

## 2016-11-16 LAB — CBC
HCT: 43.2 % (ref 36.0–46.0)
Hemoglobin: 13.4 g/dL (ref 12.0–15.0)
MCH: 27.2 pg (ref 26.0–34.0)
MCHC: 31 g/dL (ref 30.0–36.0)
MCV: 87.8 fL (ref 78.0–100.0)
Platelets: 251 10*3/uL (ref 150–400)
RBC: 4.92 MIL/uL (ref 3.87–5.11)
RDW: 15.6 % — AB (ref 11.5–15.5)
WBC: 7.6 10*3/uL (ref 4.0–10.5)

## 2016-11-16 LAB — LIPID PANEL
CHOLESTEROL: 118 mg/dL (ref 0–200)
HDL: 37 mg/dL — ABNORMAL LOW (ref >40)
LDL Cholesterol: 66 mg/dL (ref 0–99)
Total CHOL/HDL Ratio: 3.2 RATIO
Triglycerides: 77 mg/dL (ref <150)
VLDL: 15 mg/dL (ref 0–40)

## 2016-11-16 LAB — MAGNESIUM: MAGNESIUM: 1.4 mg/dL — AB (ref 1.7–2.4)

## 2016-11-16 SURGERY — CARDIOVERSION
Anesthesia: General

## 2016-11-16 MED ORDER — MAGNESIUM SULFATE 4 GM/100ML IV SOLN
4.0000 g | Freq: Once | INTRAVENOUS | Status: AC
  Administered 2016-11-16: 4 g via INTRAVENOUS
  Filled 2016-11-16: qty 100

## 2016-11-16 MED ORDER — PROPOFOL 500 MG/50ML IV EMUL
INTRAVENOUS | Status: DC | PRN
  Administered 2016-11-16: 50 ug/kg/min via INTRAVENOUS

## 2016-11-16 MED ORDER — FUROSEMIDE 40 MG PO TABS
40.0000 mg | ORAL_TABLET | Freq: Two times a day (BID) | ORAL | Status: DC
  Administered 2016-11-16 – 2016-11-20 (×9): 40 mg via ORAL
  Filled 2016-11-16 (×9): qty 1

## 2016-11-16 MED ORDER — LIDOCAINE 2% (20 MG/ML) 5 ML SYRINGE
INTRAMUSCULAR | Status: DC | PRN
  Administered 2016-11-16: 40 mg via INTRAVENOUS

## 2016-11-16 MED ORDER — SODIUM CHLORIDE 0.9 % IV SOLN: INTRAVENOUS | Status: DC

## 2016-11-16 MED ORDER — BUTAMBEN-TETRACAINE-BENZOCAINE 2-2-14 % EX AERO
INHALATION_SPRAY | CUTANEOUS | Status: DC | PRN
  Administered 2016-11-16: 2 via TOPICAL

## 2016-11-16 MED ORDER — TRAZODONE HCL 150 MG PO TABS
75.0000 mg | ORAL_TABLET | Freq: Every evening | ORAL | Status: DC | PRN
  Administered 2016-11-16 – 2016-11-19 (×3): 75 mg via ORAL
  Filled 2016-11-16 (×3): qty 1

## 2016-11-16 NOTE — Interval H&P Note (Signed)
History and Physical Interval Note:  11/16/2016 10:29 AM  Teresa York  has presented today for surgery, with the diagnosis of a fib  The various methods of treatment have been discussed with the patient and family. After consideration of risks, benefits and other options for treatment, the patient has consented to  Procedure(s): CARDIOVERSION (N/A) as a surgical intervention .  The patient's history has been reviewed, patient examined, no change in status, stable for surgery.  I have reviewed the patient's chart and labs.  Questions were answered to the patient's satisfaction.     Dorris Carnes

## 2016-11-16 NOTE — Transfer of Care (Signed)
Immediate Anesthesia Transfer of Care Note  Patient: Teresa York  Procedure(s) Performed: Procedure(s): CARDIOVERSION (N/A) TRANSESOPHAGEAL ECHOCARDIOGRAM (TEE) (N/A)  Patient Location: Endoscopy Unit  Anesthesia Type:MAC  Level of Consciousness: lethargic and responds to stimulation  Airway & Oxygen Therapy: Patient Spontanous Breathing and Patient connected to nasal cannula oxygen  Post-op Assessment: Report given to RN  Post vital signs: Reviewed and stable  Last Vitals:  Vitals:   11/16/16 1143 11/16/16 1144  BP:    Pulse: (!) 52 (!) 52  Resp: 18   Temp:      Last Pain:  Vitals:   11/16/16 1026  TempSrc: Oral  PainSc:          Complications: No apparent anesthesia complications

## 2016-11-16 NOTE — Interval H&P Note (Signed)
History and Physical Interval Note:  11/16/2016 11:43 AM  Teresa York  has presented today for surgery, with the diagnosis of a fib  The various methods of treatment have been discussed with the patient and family. After consideration of risks, benefits and other options for treatment, the patient has consented to  Procedure(s): CARDIOVERSION (N/A) TRANSESOPHAGEAL ECHOCARDIOGRAM (TEE) (N/A) as a surgical intervention .  The patient's history has been reviewed, patient examined, no change in status, stable for surgery.  I have reviewed the patient's chart and labs.  Questions were answered to the patient's satisfaction.     Dorris Carnes

## 2016-11-16 NOTE — Progress Notes (Signed)
Pt was screaming she can't breath, this RN was called, VS taken, denies chest pain, denies nausea and vomiting. Pt stated " I'm scared I feel I can't breath, I had this episode 3 times yesterday and everytime I used the bathroom". Discussed with the pt using the bedside commode, keep the O2 per [place] at 2L and teach pursed lip breathing, verbalized understanding. Will continue to monitor pt accordingly.   11/16/16 0234  Vitals  BP 124/72  BP Location Right Arm  BP Method Automatic  Patient Position (if appropriate) Lying  Pulse Rate (!) 102  Pulse Rate Source Dinamap  Resp (!) 22  Oxygen Therapy  SpO2 100 %  O2 Device Nasal Cannula  O2 Flow Rate (L/min) 2 L/min  Pain Assessment  Pain Assessment No/denies pain

## 2016-11-16 NOTE — Anesthesia Postprocedure Evaluation (Addendum)
Anesthesia Post Note  Patient: Teresa York  Procedure(s) Performed: Procedure(s) (LRB): CARDIOVERSION (N/A) TRANSESOPHAGEAL ECHOCARDIOGRAM (TEE) (N/A)  Patient location during evaluation: PACU Anesthesia Type: General Level of consciousness: sedated Pain management: pain level controlled Vital Signs Assessment: post-procedure vital signs reviewed and stable Respiratory status: spontaneous breathing and respiratory function stable Cardiovascular status: stable Anesthetic complications: no       Last Vitals:  Vitals:   11/16/16 1150 11/16/16 1200  BP: (!) 90/40 (!) 107/58  Pulse: (!) 52 (!) 55  Resp: (!) 23 14  Temp: 36.4 C     Last Pain:  Vitals:   11/16/16 1150  TempSrc: Axillary  PainSc:                  [name]

## 2016-11-16 NOTE — Op Note (Signed)
Atria are large LA appendage is very large No obvious thrombus TV is normal  Trivial TR MV is normal  There is at least moderate to severe MR directed more posterior into LA  AV is mildly thickened.  There are 2 jets of AI, prodominent is central and directed eccentrically into LV  It is mild to moderate. PV is normal LVEF is at least moderately depressed  RVEF is depressed Aorta with mild plaquing   Full report to follow

## 2016-11-16 NOTE — Anesthesia Preprocedure Evaluation (Addendum)
Anesthesia Evaluation  Patient identified by MRN, date of birth, ID band Patient awake    Reviewed: Allergy & Precautions, NPO status , Patient's Chart, lab work & pertinent test results  History of Anesthesia Complications Negative for: history of anesthetic complications  Airway Mallampati: I  TM Distance: >3 FB Neck ROM: Full    Dental no notable dental hx. (+) Dental Advisory Given   Pulmonary neg pulmonary ROS,    Pulmonary exam normal        Cardiovascular hypertension, Pt. on medications + Valvular Problems/Murmurs AI  Rhythm:Irregular     Neuro/Psych PSYCHIATRIC DISORDERS Anxiety Depression negative neurological ROS  negative psych ROS   GI/Hepatic Neg liver ROS, GERD  Medicated and Controlled,  Endo/Other  Hypothyroidism   Renal/GU negative Renal ROS     Musculoskeletal   Abdominal   Peds  Hematology   Anesthesia Other Findings   Reproductive/Obstetrics                            Anesthesia Physical  Anesthesia Plan  ASA: III  Anesthesia Plan: MAC   Post-op Pain Management:    Induction: Intravenous  Airway Management Planned: Natural Airway and Simple Face Mask  Additional Equipment:   Intra-op Plan:   Post-operative Plan:   Informed Consent: I have reviewed the patients History and Physical, chart, labs and discussed the procedure including the risks, benefits and alternatives for the proposed anesthesia with the patient or authorized representative who has indicated his/her understanding and acceptance.   Dental advisory given  Plan Discussed with: CRNA and Surgeon  Anesthesia Plan Comments:        Anesthesia Quick Evaluation

## 2016-11-16 NOTE — H&P (View-Only) (Signed)
Progress Note  Patient Name: Teresa York Date of Encounter: 11/16/2016  Primary Cardiologist: Dr. [name]  Subjective   Pt states she had a terrible night with bouts of anxiety/panic attacks. This morning she is resting comfortably, husband at bedside.  Inpatient Medications    Scheduled Meds: . amiodarone  400 mg Oral BID  . ferrous sulfate  325 mg Oral Q breakfast  . FLUoxetine  40 mg Oral Daily  . furosemide  40 mg Intravenous BID  . levothyroxine  175 mcg Oral QAC breakfast  . loratadine  10 mg Oral Daily  . metoprolol tartrate  25 mg Oral TID  . pantoprazole  40 mg Oral Daily  . potassium chloride  20 mEq Oral BID  . Rivaroxaban  15 mg Oral Q supper  . sodium chloride flush  3 mL Intravenous Q12H   Continuous Infusions: . sodium chloride     PRN Meds: acetaminophen, metoprolol, ondansetron (ZOFRAN) IV, sodium chloride flush, zolpidem   Vital Signs    Vitals:   11/15/16 1449 11/15/16 1942 11/16/16 0234 11/16/16 0536  BP: 116/84 107/63 124/72 (!) 108/56  Pulse: (!) 120 92 (!) 102 (!) 103  Resp: 18 18 (!) 22 20  Temp:  97.2 F (36.2 C)  97.4 F (36.3 C)  TempSrc:  Oral  Oral  SpO2: 99% 99% 100% 100%  Weight:    186 lb 9.6 oz (84.6 kg)  Height:        Intake/Output Summary (Last 24 hours) at 11/16/16 0828 Last data filed at 11/16/16 0600  Gross per 24 hour  Intake              720 ml  Output             1165 ml  Net             -445 ml   Filed Weights   11/14/16 0538 11/15/16 0440 11/16/16 0536  Weight: 189 lb 4.8 oz (85.9 kg) 189 lb 1.6 oz (85.8 kg) 186 lb 9.6 oz (84.6 kg)     Physical Exam   General: Well developed, well nourished, female appearing in no acute distress. Head: Normocephalic, atraumatic.  Neck: Supple without bruits, no JVD Lungs:  Resp regular and unlabored, CTA. Heart: Irregular rhythm, regular rate, S1, S2, no murmur; no rub. Abdomen: Soft, non-tender, non-distended with normoactive bowel sounds. No hepatomegaly. No  rebound/guarding. No obvious abdominal masses. Extremities: No clubbing, cyanosis, Trace B LE edema. Distal pedal pulses are 2+ bilaterally. Neuro: Alert and oriented X 3. Moves all extremities spontaneously. Psych: Normal affect.  Labs    Chemistry Recent Labs Lab 11/13/16 1014 11/14/16 0500 11/16/16 0359  NA 139 138 136  K 3.6 3.6 3.6  CL 102 99* 96*  CO2 21* 25 25  GLUCOSE 127* 98 160*  BUN 15 15 24*  CREATININE 1.15* 1.24* 1.67*  CALCIUM 9.7 9.4 9.3  GFRNONAA 45* 41* 28*  GFRAA 52* 47* 33*  ANIONGAP 16* 14 15     Hematology Recent Labs Lab 11/14/16 0500 11/16/16 0359  WBC 7.1 7.6  RBC 4.72 4.92  HGB 12.9 13.4  HCT 41.4 43.2  MCV 87.7 87.8  MCH 27.3 27.2  MCHC 31.2 31.0  RDW 16.1* 15.6*  PLT 233 251    Cardiac EnzymesNo results for input(s): TROPONINI in the last 168 hours. No results for input(s): TROPIPOC in the last 168 hours.   BNP Recent Labs Lab 11/12/16 1551  BNP 769.8*  DDimer No results for input(s): DDIMER in the last 168 hours.   Radiology    No results found.   Telemetry    Rate controlled Afib, pauses were less than 2.5 sec - Personally Reviewed  ECG    No new tracings   Cardiac Studies   Echo 11/01/16: Study Conclusions - Left ventricle: The cavity size was normal. There was moderate concentric hypertrophy. Systolic function was moderately to severely reduced. The estimated ejection fraction was 30%. Diffuse hypokinesis. The study was not technically sufficient to allow evaluation of LV diastolic dysfunction due to atrial fibrillation. - Aortic valve: There was moderate regurgitation. - Aortic root: The aortic root was normal in size. - Ascending aorta: The ascending aorta was normal in size. - Mitral valve: There was moderate regurgitation. - Left atrium: The atrium was moderately dilated. - Right ventricle: Systolic function was mildly reduced. - Right atrium: The atrium was mildly dilated. - Pulmonary  arteries: Systolic pressure was mildly increased. PA peak pressure: 35 mm Hg (S). - Inferior vena cava: The vessel was normal in size. - Pericardium, extracardiac: There was no pericardial effusion. There was a left pleural effusion.  Impressions: - When compared to the prior study from 03/09/2015 LVEF has significantly decreased from 55-60% to 30-35%. LVEF is partially underestimated secondary to atrial fibrillation with RVR during the acquisition.  Patient Profile     76 y.o. female with PMH significant for HTN, HLD, OSA, PAF, and acute on chronic systolic and diastolic heart failure admitted with hypervolemia and recurrent Afib RVR. She was recently admitted to [facility] 10/31/16 with Afib RVRand CHF. She was diureses at that time and cardioverted to Sr. Now with LE edema and Afib.  Assessment & Plan    1. Atrial Fib with RVR:  - failed recent cardioversion.  -  Amiodarone bolus given 11/14/16; continue PO amiodarone load - 400 mg BID - telemetry this morning with rate controlled Afib in the 90s  - Levothyroxine dose decreased per pharmacy.  - On Metoprolol 25mg  po BID, amiodarone 400mg  po BID for rate control. On Xarelto 15mg  po qPM. Patient is asymptomatic.  - plan for DCCV today while on amiodarone - ordered repeat Magnesium; replace as needed  2. Acute on chronic combined systolic and diastolic CHF: - She continues to diurese: Weight 186 lbs (189 lbs); Dry weight 184 as of 06/22/16 office visit; 1.1 L + 3 occurrences of urine output yesterday; net negative 4 L.  - continue supplemental oxygean  - sCr 1.67 (1.24).  - transitioned lasix to PO dosing today  3. Cardiomyopathy -Still unclear of etiology. May be tachy mediated from her afib. Consider evaluation if does not improve with rate/rhythm control.  -BP has been marginal (diastolic pressure in the 123456).  Will hold addition of ACE/ARB. Will restart as pressure tolerates.  - May consider switch back to long acting  metoprolol given systolic dysfunction.  4. HTN: - sBP 100-120s, continue current regimen  5. HLD: - Lipid panel pending (ordered today)  6. Anxiety - overnight, had several bouts of increased anxiety and possibly panic attacks - may be secondary to palpitation sensations - pt calm and resting now - re-ordered home trazodone PRN   Signed, Minette Brine , PA-C 8:28 AM 11/16/2016 Pager: [phone]  I have personally seen and examined this patient with Doreene Adas, PA-C. I agree with the assessment and plan as outlined above. She remains in atrial fib this am with rates in the low 100s. Will plan on  DCCV today at noon. She is NPO. She will be converted to oral Lasix as her volume status seems better. She will need to be discharged home on oral Lasix.   Lauree Chandler 11/16/2016 9:07 AM

## 2016-11-16 NOTE — Progress Notes (Signed)
Pt was crying and stated " I don't want to die, I can't breath." O2 was 100% on 2L per [place], denies chest pain. Dr. Percival Spanish was notified, no new orders given at this time. Will monitor accordingly.

## 2016-11-16 NOTE — Op Note (Signed)
Pt sedated by anesthesia with propofol With pads in AP position pt cardioverted to SR with 200 J synchronized biphasic energy. Procedure without complication.

## 2016-11-16 NOTE — Progress Notes (Signed)
  Echocardiogram Echocardiogram Transesophageal has been performed.  Tresa Res 11/16/2016, 1:37 PM

## 2016-11-16 NOTE — Progress Notes (Addendum)
Progress Note  Patient Name: Teresa York Date of Encounter: 11/16/2016  Primary Cardiologist: Dr. [name]  Subjective   Pt states she had a terrible night with bouts of anxiety/panic attacks. This morning she is resting comfortably, husband at bedside.  Inpatient Medications    Scheduled Meds: . amiodarone  400 mg Oral BID  . ferrous sulfate  325 mg Oral Q breakfast  . FLUoxetine  40 mg Oral Daily  . furosemide  40 mg Intravenous BID  . levothyroxine  175 mcg Oral QAC breakfast  . loratadine  10 mg Oral Daily  . metoprolol tartrate  25 mg Oral TID  . pantoprazole  40 mg Oral Daily  . potassium chloride  20 mEq Oral BID  . Rivaroxaban  15 mg Oral Q supper  . sodium chloride flush  3 mL Intravenous Q12H   Continuous Infusions: . sodium chloride     PRN Meds: acetaminophen, metoprolol, ondansetron (ZOFRAN) IV, sodium chloride flush, zolpidem   Vital Signs    Vitals:   11/15/16 1449 11/15/16 1942 11/16/16 0234 11/16/16 0536  BP: 116/84 107/63 124/72 (!) 108/56  Pulse: (!) 120 92 (!) 102 (!) 103  Resp: 18 18 (!) 22 20  Temp:  97.2 F (36.2 C)  97.4 F (36.3 C)  TempSrc:  Oral  Oral  SpO2: 99% 99% 100% 100%  Weight:    186 lb 9.6 oz (84.6 kg)  Height:        Intake/Output Summary (Last 24 hours) at 11/16/16 0828 Last data filed at 11/16/16 0600  Gross per 24 hour  Intake              720 ml  Output             1165 ml  Net             -445 ml   Filed Weights   11/14/16 0538 11/15/16 0440 11/16/16 0536  Weight: 189 lb 4.8 oz (85.9 kg) 189 lb 1.6 oz (85.8 kg) 186 lb 9.6 oz (84.6 kg)     Physical Exam   General: Well developed, well nourished, female appearing in no acute distress. Head: Normocephalic, atraumatic.  Neck: Supple without bruits, no JVD Lungs:  Resp regular and unlabored, CTA. Heart: Irregular rhythm, regular rate, S1, S2, no murmur; no rub. Abdomen: Soft, non-tender, non-distended with normoactive bowel sounds. No hepatomegaly. No  rebound/guarding. No obvious abdominal masses. Extremities: No clubbing, cyanosis, Trace B LE edema. Distal pedal pulses are 2+ bilaterally. Neuro: Alert and oriented X 3. Moves all extremities spontaneously. Psych: Normal affect.  Labs    Chemistry Recent Labs Lab 11/13/16 1014 11/14/16 0500 11/16/16 0359  NA 139 138 136  K 3.6 3.6 3.6  CL 102 99* 96*  CO2 21* 25 25  GLUCOSE 127* 98 160*  BUN 15 15 24*  CREATININE 1.15* 1.24* 1.67*  CALCIUM 9.7 9.4 9.3  GFRNONAA 45* 41* 28*  GFRAA 52* 47* 33*  ANIONGAP 16* 14 15     Hematology Recent Labs Lab 11/14/16 0500 11/16/16 0359  WBC 7.1 7.6  RBC 4.72 4.92  HGB 12.9 13.4  HCT 41.4 43.2  MCV 87.7 87.8  MCH 27.3 27.2  MCHC 31.2 31.0  RDW 16.1* 15.6*  PLT 233 251    Cardiac EnzymesNo results for input(s): TROPONINI in the last 168 hours. No results for input(s): TROPIPOC in the last 168 hours.   BNP Recent Labs Lab 11/12/16 1551  BNP 769.8*  DDimer No results for input(s): DDIMER in the last 168 hours.   Radiology    No results found.   Telemetry    Rate controlled Afib, pauses were less than 2.5 sec - Personally Reviewed  ECG    No new tracings   Cardiac Studies   Echo 11/01/16: Study Conclusions - Left ventricle: The cavity size was normal. There was moderate concentric hypertrophy. Systolic function was moderately to severely reduced. The estimated ejection fraction was 30%. Diffuse hypokinesis. The study was not technically sufficient to allow evaluation of LV diastolic dysfunction due to atrial fibrillation. - Aortic valve: There was moderate regurgitation. - Aortic root: The aortic root was normal in size. - Ascending aorta: The ascending aorta was normal in size. - Mitral valve: There was moderate regurgitation. - Left atrium: The atrium was moderately dilated. - Right ventricle: Systolic function was mildly reduced. - Right atrium: The atrium was mildly dilated. - Pulmonary  arteries: Systolic pressure was mildly increased. PA peak pressure: 35 mm Hg (S). - Inferior vena cava: The vessel was normal in size. - Pericardium, extracardiac: There was no pericardial effusion. There was a left pleural effusion.  Impressions: - When compared to the prior study from 03/09/2015 LVEF has significantly decreased from 55-60% to 30-35%. LVEF is partially underestimated secondary to atrial fibrillation with RVR during the acquisition.  Patient Profile     76 y.o. female with PMH significant for HTN, HLD, OSA, PAF, and acute on chronic systolic and diastolic heart failure admitted with hypervolemia and recurrent Afib RVR. She was recently admitted to [facility] 10/31/16 with Afib RVRand CHF. She was diureses at that time and cardioverted to Sr. Now with LE edema and Afib.  Assessment & Plan    1. Atrial Fib with RVR:  - failed recent cardioversion.  -  Amiodarone bolus given 11/14/16; continue PO amiodarone load - 400 mg BID - telemetry this morning with rate controlled Afib in the 90s  - Levothyroxine dose decreased per pharmacy.  - On Metoprolol 25mg  po BID, amiodarone 400mg  po BID for rate control. On Xarelto 15mg  po qPM. Patient is asymptomatic.  - plan for DCCV today while on amiodarone - ordered repeat Magnesium; replace as needed  2. Acute on chronic combined systolic and diastolic CHF: - She continues to diurese: Weight 186 lbs (189 lbs); Dry weight 184 as of 06/22/16 office visit; 1.1 L + 3 occurrences of urine output yesterday; net negative 4 L.  - continue supplemental oxygean  - sCr 1.67 (1.24).  - transitioned lasix to PO dosing today  3. Cardiomyopathy -Still unclear of etiology. May be tachy mediated from her afib. Consider evaluation if does not improve with rate/rhythm control.  -BP has been marginal (diastolic pressure in the 123456).  Will hold addition of ACE/ARB. Will restart as pressure tolerates.  - May consider switch back to long acting  metoprolol given systolic dysfunction.  4. HTN: - sBP 100-120s, continue current regimen  5. HLD: - Lipid panel pending (ordered today)  6. Anxiety - overnight, had several bouts of increased anxiety and possibly panic attacks - may be secondary to palpitation sensations - pt calm and resting now - re-ordered home trazodone PRN  7. Stage 3 CKD: Will follow creatinine closely off of IV Lasix  8. Hypomagnesemia: Will replace magnesium today     Signed, Minette Brine , PA-C 8:28 AM 11/16/2016 Pager: [phone]  I have personally seen and examined this patient with Doreene Adas, PA-C. I agree with the  assessment and plan as outlined above. She remains in atrial fib this am with rates in the low 100s. Will plan on DCCV today at noon. She is NPO. She will be converted to oral Lasix as her volume status seems better. She will need to be discharged home on oral Lasix. Replace magnesium. CKD, stage 3. Will follow creatinine closely, repeat BMET in am.   Lauree Chandler 11/16/2016 9:07 AM

## 2016-11-17 DIAGNOSIS — I340 Nonrheumatic mitral (valve) insufficiency: Secondary | ICD-10-CM

## 2016-11-17 DIAGNOSIS — N183 Chronic kidney disease, stage 3 (moderate): Secondary | ICD-10-CM

## 2016-11-17 LAB — BASIC METABOLIC PANEL
Anion gap: 13 (ref 5–15)
BUN: 28 mg/dL — ABNORMAL HIGH (ref 6–20)
CHLORIDE: 96 mmol/L — AB (ref 101–111)
CO2: 25 mmol/L (ref 22–32)
Calcium: 9.4 mg/dL (ref 8.9–10.3)
Creatinine, Ser: 1.83 mg/dL — ABNORMAL HIGH (ref 0.44–1.00)
GFR calc Af Amer: 30 mL/min — ABNORMAL LOW (ref >60)
GFR, EST NON AFRICAN AMERICAN: 25 mL/min — AB (ref >60)
Glucose, Bld: 106 mg/dL — ABNORMAL HIGH (ref 65–99)
POTASSIUM: 4.8 mmol/L (ref 3.5–5.1)
SODIUM: 134 mmol/L — AB (ref 135–145)

## 2016-11-17 MED ORDER — METOPROLOL SUCCINATE ER 25 MG PO TB24
25.0000 mg | ORAL_TABLET | Freq: Every day | ORAL | Status: DC
  Administered 2016-11-17 – 2016-11-18 (×2): 25 mg via ORAL
  Filled 2016-11-17 (×2): qty 1

## 2016-11-17 MED ORDER — METOPROLOL SUCCINATE ER 25 MG PO TB24
25.0000 mg | ORAL_TABLET | Freq: Every day | ORAL | Status: DC
  Filled 2016-11-17: qty 1

## 2016-11-17 NOTE — Progress Notes (Signed)
Pt received Metoprolol 25mg  po this am aft 11am and noted order for Toporol XL 25mg  aor 1300.  Notified B. Strader, PA and she instructed that she will change time for this evening.  Karie Kirks, RN

## 2016-11-17 NOTE — Progress Notes (Signed)
Progress Note  Patient Name: Teresa York Date of Encounter: 11/17/2016  Primary Cardiologist: Dr. Darlina Guys  Subjective   Feels better today in general. Less anxious. No chest pain or palpitations.  Inpatient Medications    Scheduled Meds: . amiodarone  400 mg Oral BID  . ferrous sulfate  325 mg Oral Q breakfast  . FLUoxetine  40 mg Oral Daily  . furosemide  40 mg Oral BID  . levothyroxine  175 mcg Oral QAC breakfast  . loratadine  10 mg Oral Daily  . metoprolol tartrate  25 mg Oral TID  . pantoprazole  40 mg Oral Daily  . potassium chloride  20 mEq Oral BID  . Rivaroxaban  15 mg Oral Q supper  . sodium chloride flush  3 mL Intravenous Q12H   Continuous Infusions: . sodium chloride 250 mL (11/16/16 1029)   PRN Meds: acetaminophen, metoprolol, ondansetron (ZOFRAN) IV, sodium chloride flush, traZODone, zolpidem   Vital Signs    Vitals:   11/17/16 0016 11/17/16 0431 11/17/16 0541 11/17/16 1128  BP: 111/66  (!) 106/51 (!) 110/56  Pulse:   (!) 53 (!) 56  Resp:   18   Temp:   97.4 F (36.3 C)   TempSrc:   Oral   SpO2:   98%   Weight:  190 lb (86.2 kg)    Height:        Intake/Output Summary (Last 24 hours) at 11/17/16 1134 Last data filed at 11/17/16 0900  Gross per 24 hour  Intake              720 ml  Output              220 ml  Net              500 ml   Filed Weights   11/15/16 0440 11/16/16 0536 11/17/16 0431  Weight: 189 lb 1.6 oz (85.8 kg) 186 lb 9.6 oz (84.6 kg) 190 lb (86.2 kg)    Telemetry    I personally reviewed telemetry which shows sinus rhythm and sinus bradycardia.  ECG    I personally reviewed the tracing from 11/17/2016 which shows sinus bradycardia with prolonged PR interval, low voltage, anterolateral T wave inversions prolonged QT interval.  Physical Exam   GEN: Obese woman, acute distress.   Neck: No JVD Cardiac: RRR, apical systolic murmur.  Respiratory: Clear to auscultation bilaterally. GI: Soft, nontender, non-distended    MS: No edema; No deformity.  Labs    Chemistry Recent Labs Lab 11/14/16 0500 11/16/16 0359 11/17/16 0410  NA 138 136 134*  K 3.6 3.6 4.8  CL 99* 96* 96*  CO2 25 25 25   GLUCOSE 98 160* 106*  BUN 15 24* 28*  CREATININE 1.24* 1.67* 1.83*  CALCIUM 9.4 9.3 9.4  GFRNONAA 41* 28* 25*  GFRAA 47* 33* 30*  ANIONGAP 14 15 13      Hematology Recent Labs Lab 11/14/16 0500 11/16/16 0359  WBC 7.1 7.6  RBC 4.72 4.92  HGB 12.9 13.4  HCT 41.4 43.2  MCV 87.7 87.8  MCH 27.3 27.2  MCHC 31.2 31.0  RDW 16.1* 15.6*  PLT 233 251    BNP Recent Labs Lab 11/12/16 1551  BNP 769.8*     Radiology    No results found.  Cardiac Studies   TEE 11/16/2016: Study Conclusions  - Left ventricle: LVEF Is moderately depressed - Mitral valve: MV is normal There is a least moderate to severe MR  directed posterior into LA. - Left atrium: No evidence of thrombus in the atrial cavity or   appendage.  Echocardiogram 11/01/2016: Study Conclusions  - Left ventricle: The cavity size was normal. There was moderate   concentric hypertrophy. Systolic function was moderately to   severely reduced. The estimated ejection fraction was 30%.   Diffuse hypokinesis. The study was not technically sufficient to   allow evaluation of LV diastolic dysfunction due to atrial   fibrillation. - Aortic valve: There was moderate regurgitation. - Aortic root: The aortic root was normal in size. - Ascending aorta: The ascending aorta was normal in size. - Mitral valve: There was moderate regurgitation. - Left atrium: The atrium was moderately dilated. - Right ventricle: Systolic function was mildly reduced. - Right atrium: The atrium was mildly dilated. - Pulmonary arteries: Systolic pressure was mildly increased. PA   peak pressure: 35 mm Hg (S). - Inferior vena cava: The vessel was normal in size. - Pericardium, extracardiac: There was no pericardial effusion.   There was a left pleural  effusion.  Impressions:  - When compared to the prior study from 03/09/2015 LVEF has   significantly decreased from 55-60% to 30-35%. LVEF is partially   underestimated secondary to atrial fibrillation with RVR during   the acquisition.  Patient Profile     76 y.o. female with a history of hypertension, hyperlipidemia, OSA, and paroxysmal atrial fibrillation. 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 Also has moderate to severe mitral regurgitation and moderate aortic regurgitation. She underwent TEE guided cardioversion yesterday and is maintaining sinus rhythm today on oral amiodarone load.  Assessment & Plan    1. Paroxysmal atrial fibrillation now status post successful TEE guided cardioversion. She is maintaining sinus rhythm today. She continues on Xarelto, oral amiodarone load, and Lopressor.  2. 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  3. Moderate to severe mitral regurgitation and moderate aortic regurgitation. Possible that mitral regurgitation might improve if LV dysfunction resolves in sinus rhythm and with medical therapy adjustments.  4. History of hypertension, blood pressure low normal at this time and does limit medication titration.  5. CKD, stage 3. Creatinine 1.8.  Discussed with patient and husband today. Recommend that she ambulate today to reassess stamina. Plan to switch from short-acting Lopressor to Toprol-XL in light of cardiomyopathy, continue oral amiodarone load and Xarelto. Holding off on ARB or ACE inhibitor with low-normal blood pressure and renal insufficiency at this time. Possibly discharge in the next 24 hours for close outpatient follow-up. She will need to have reassessment of LVEF and valvular heart disease once she has been in sinus rhythm longer to assess for improvement.  Signed, Rozann Lesches, MD  11/17/2016, 11:34 AM

## 2016-11-18 LAB — CBC
HEMATOCRIT: 43.8 % (ref 36.0–46.0)
HEMOGLOBIN: 13.7 g/dL (ref 12.0–15.0)
MCH: 27.3 pg (ref 26.0–34.0)
MCHC: 31.3 g/dL (ref 30.0–36.0)
MCV: 87.4 fL (ref 78.0–100.0)
Platelets: 194 10*3/uL (ref 150–400)
RBC: 5.01 MIL/uL (ref 3.87–5.11)
RDW: 15.4 % (ref 11.5–15.5)
WBC: 6.4 10*3/uL (ref 4.0–10.5)

## 2016-11-18 LAB — BASIC METABOLIC PANEL
ANION GAP: 11 (ref 5–15)
BUN: 25 mg/dL — ABNORMAL HIGH (ref 6–20)
CO2: 28 mmol/L (ref 22–32)
Calcium: 9.5 mg/dL (ref 8.9–10.3)
Chloride: 99 mmol/L — ABNORMAL LOW (ref 101–111)
Creatinine, Ser: 1.42 mg/dL — ABNORMAL HIGH (ref 0.44–1.00)
GFR calc non Af Amer: 35 mL/min — ABNORMAL LOW (ref >60)
GFR, EST AFRICAN AMERICAN: 40 mL/min — AB (ref >60)
GLUCOSE: 86 mg/dL (ref 65–99)
POTASSIUM: 3.7 mmol/L (ref 3.5–5.1)
Sodium: 138 mmol/L (ref 135–145)

## 2016-11-18 MED ORDER — METOPROLOL SUCCINATE ER 25 MG PO TB24
25.0000 mg | ORAL_TABLET | Freq: Once | ORAL | Status: AC
  Administered 2016-11-18: 25 mg via ORAL
  Filled 2016-11-18: qty 1

## 2016-11-18 MED ORDER — METOPROLOL SUCCINATE ER 50 MG PO TB24
50.0000 mg | ORAL_TABLET | Freq: Every day | ORAL | Status: DC
  Administered 2016-11-19: 50 mg via ORAL
  Filled 2016-11-18: qty 1

## 2016-11-18 NOTE — Progress Notes (Signed)
Progress Note  Patient Name: Teresa York Date of Encounter: 11/18/2016  Primary Cardiologist: Dr. Darlina Guys  Subjective   No chest pain or breathlessness at rest.  Inpatient Medications    Scheduled Meds: . amiodarone  400 mg Oral BID  . ferrous sulfate  325 mg Oral Q breakfast  . FLUoxetine  40 mg Oral Daily  . furosemide  40 mg Oral BID  . levothyroxine  175 mcg Oral QAC breakfast  . loratadine  10 mg Oral Daily  . metoprolol succinate  25 mg Oral Daily  . pantoprazole  40 mg Oral Daily  . potassium chloride  20 mEq Oral BID  . Rivaroxaban  15 mg Oral Q supper  . sodium chloride flush  3 mL Intravenous Q12H   Continuous Infusions: . sodium chloride 250 mL (11/16/16 1029)   PRN Meds: acetaminophen, metoprolol, ondansetron (ZOFRAN) IV, sodium chloride flush, traZODone, zolpidem   Vital Signs    Vitals:   11/17/16 1716 11/17/16 2003 11/18/16 0644 11/18/16 1102  BP: 108/76 113/67 118/68 117/70  Pulse: 90 92 (!) 110 (!) 109  Resp:  18 18   Temp:  97.4 F (36.3 C) 98 F (36.7 C)   TempSrc:  Oral Oral   SpO2:  98% 95%   Weight:   182 lb 6.4 oz (82.7 kg)   Height:        Intake/Output Summary (Last 24 hours) at 11/18/16 1127 Last data filed at 11/18/16 0819  Gross per 24 hour  Intake              920 ml  Output              550 ml  Net              370 ml   Filed Weights   11/16/16 0536 11/17/16 0431 11/18/16 0644  Weight: 186 lb 9.6 oz (84.6 kg) 190 lb (86.2 kg) 182 lb 6.4 oz (82.7 kg)    Telemetry    I personally reviewed telemetry which shows coarse atrial fibrillation/flutter.  ECG    I personally reviewed the tracing from 11/17/2016 which shows sinus bradycardia with prolonged PR interval, low voltage, anterolateral T wave inversions prolonged QT interval.  Physical Exam   GEN: Obese woman, acute distress.   Neck: No JVD Cardiac: RRR, apical systolic murmur.  Respiratory: Clear to auscultation bilaterally. GI: Soft, nontender,  non-distended  MS: No edema; No deformity.  Labs    Chemistry  Recent Labs Lab 11/16/16 0359 11/17/16 0410 11/18/16 0730  NA 136 134* 138  K 3.6 4.8 3.7  CL 96* 96* 99*  CO2 25 25 28   GLUCOSE 160* 106* 86  BUN 24* 28* 25*  CREATININE 1.67* 1.83* 1.42*  CALCIUM 9.3 9.4 9.5  GFRNONAA 28* 25* 35*  GFRAA 33* 30* 40*  ANIONGAP 15 13 11      Hematology  Recent Labs Lab 11/14/16 0500 11/16/16 0359 11/18/16 0730  WBC 7.1 7.6 6.4  RBC 4.72 4.92 5.01  HGB 12.9 13.4 13.7  HCT 41.4 43.2 43.8  MCV 87.7 87.8 87.4  MCH 27.3 27.2 27.3  MCHC 31.2 31.0 31.3  RDW 16.1* 15.6* 15.4  PLT 233 251 194    BNP  Recent Labs Lab 11/12/16 1551  BNP 769.8*     Radiology    No results found.  Cardiac Studies   TEE 11/16/2016: Study Conclusions  - Left ventricle: LVEF Is moderately depressed - Mitral valve: MV is normal There  is a least moderate to severe MR   directed posterior into LA. - Left atrium: No evidence of thrombus in the atrial cavity or   appendage.  Echocardiogram 11/01/2016: Study Conclusions  - Left ventricle: The cavity size was normal. There was moderate   concentric hypertrophy. Systolic function was moderately to   severely reduced. The estimated ejection fraction was 30%.   Diffuse hypokinesis. The study was not technically sufficient to   allow evaluation of LV diastolic dysfunction due to atrial   fibrillation. - Aortic valve: There was moderate regurgitation. - Aortic root: The aortic root was normal in size. - Ascending aorta: The ascending aorta was normal in size. - Mitral valve: There was moderate regurgitation. - Left atrium: The atrium was moderately dilated. - Right ventricle: Systolic function was mildly reduced. - Right atrium: The atrium was mildly dilated. - Pulmonary arteries: Systolic pressure was mildly increased. PA   peak pressure: 35 mm Hg (S). - Inferior vena cava: The vessel was normal in size. - Pericardium, extracardiac:  There was no pericardial effusion.   There was a left pleural effusion.  Impressions:  - When compared to the prior study from 03/09/2015 LVEF has   significantly decreased from 55-60% to 30-35%. LVEF is partially   underestimated secondary to atrial fibrillation with RVR during   the acquisition.  Patient Profile     76 y.o. female with a history of hypertension, hyperlipidemia, OSA, and paroxysmal atrial fibrillation. 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 Also has moderate to severe mitral regurgitation and moderate aortic regurgitation. She underwent TEE guided cardioversion 2/9 and had recurrent atrial fibrillation/flutter on 2/10.  Assessment & Plan    1. Paroxysmal atrial fibrillation now status post successful TEE guided cardioversion on 2/9, but unfortunately recurrent atrial fibrillation/flutter on 2/10. She continues on Xarelto, oral amiodarone load, and Toprol XL.  2. 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  3. Moderate to severe mitral regurgitation and moderate aortic regurgitation. Possible that mitral regurgitation might improve if LV dysfunction resolves in sinus rhythm and with medical therapy adjustments.  4. History of hypertension, blood pressure low normal at this time and does limit medication titration.  5. CKD, stage 3. Creatinine down to 1.4.  Discussed with patient and husband today. Recommend increasingToprol-XL for better heart rate control. She will probably lead need a longer time loading on amiodarone prior to considering a follow-up cardioversion attempt as an outpatient (would not need TEE at that time if she stays on anticoagulation). Keep today to make sure that heart rate control is adequate, possibly discharged tomorrow. Holding off on ARB or ACE inhibitor with low-normal blood pressure and renal insufficiency at this time. Eventually she will need to have reassessment of  LVEF and valvular heart disease once she has been in sinus rhythm longer to assess for improvement.  Signed, Rozann Lesches, MD  11/18/2016, 11:27 AM

## 2016-11-18 NOTE — Progress Notes (Signed)
Patient alert and oriented. Vitals stable overnight. No complaints of pain. Patient in bed resting.

## 2016-11-18 NOTE — Discharge Instructions (Signed)

## 2016-11-19 ENCOUNTER — Encounter (HOSPITAL_COMMUNITY): Payer: Self-pay | Admitting: Internal Medicine

## 2016-11-19 LAB — BASIC METABOLIC PANEL
Anion gap: 11 (ref 5–15)
BUN: 21 mg/dL — ABNORMAL HIGH (ref 6–20)
CHLORIDE: 96 mmol/L — AB (ref 101–111)
CO2: 33 mmol/L — AB (ref 22–32)
CREATININE: 1.44 mg/dL — AB (ref 0.44–1.00)
Calcium: 9.1 mg/dL (ref 8.9–10.3)
GFR calc Af Amer: 39 mL/min — ABNORMAL LOW (ref >60)
GFR calc non Af Amer: 34 mL/min — ABNORMAL LOW (ref >60)
GLUCOSE: 91 mg/dL (ref 65–99)
Potassium: 3.6 mmol/L (ref 3.5–5.1)
Sodium: 140 mmol/L (ref 135–145)

## 2016-11-19 MED ORDER — METOPROLOL SUCCINATE ER 100 MG PO TB24
100.0000 mg | ORAL_TABLET | Freq: Every day | ORAL | Status: DC
  Administered 2016-11-20: 100 mg via ORAL
  Filled 2016-11-19 (×2): qty 1

## 2016-11-19 MED ORDER — METOPROLOL SUCCINATE ER 50 MG PO TB24
50.0000 mg | ORAL_TABLET | Freq: Once | ORAL | Status: AC
  Administered 2016-11-19: 50 mg via ORAL
  Filled 2016-11-19: qty 1

## 2016-11-19 NOTE — Progress Notes (Signed)
Pt. Resting in bed quietly. No s/s of stress or discomfort noted. Family at bedside. Pt. Requesting night time meds at this time. RN will continue to monitor pt. For changes in condition. Tamica Covell, Katherine Roan

## 2016-11-19 NOTE — Progress Notes (Signed)
Progress Note  Patient Name: Teresa York Date of Encounter: 11/19/2016  Primary Cardiologist: Dr. Darlina Guys  Subjective  Patient reports having heart palpitations. Denies having any CP, SOB, or lightheadedness.    Inpatient Medications    Scheduled Meds: . amiodarone  400 mg Oral BID  . ferrous sulfate  325 mg Oral Q breakfast  . FLUoxetine  40 mg Oral Daily  . furosemide  40 mg Oral BID  . levothyroxine  175 mcg Oral QAC breakfast  . loratadine  10 mg Oral Daily  . metoprolol succinate  50 mg Oral Daily  . pantoprazole  40 mg Oral Daily  . potassium chloride  20 mEq Oral BID  . Rivaroxaban  15 mg Oral Q supper  . sodium chloride flush  3 mL Intravenous Q12H   Continuous Infusions: . sodium chloride 250 mL (11/16/16 1029)   PRN Meds: acetaminophen, metoprolol, ondansetron (ZOFRAN) IV, sodium chloride flush, traZODone, zolpidem   Vital Signs    Vitals:   11/18/16 1102 11/18/16 1242 11/18/16 2300 11/19/16 0630  BP: 117/70 112/82 110/80 110/70  Pulse: (!) 109 (!) 105 (!) 104 (!) 112  Resp:  18 18 18   Temp:  98.1 F (36.7 C) 98 F (36.7 C) 97.3 F (36.3 C)  TempSrc:  Oral Oral Oral  SpO2:  97% 98% 96%  Weight:    181 lb 11.2 oz (82.4 kg)  Height:        Intake/Output Summary (Last 24 hours) at 11/19/16 0908 Last data filed at 11/19/16 0600  Gross per 24 hour  Intake              483 ml  Output              200 ml  Net              283 ml   Filed Weights   11/17/16 0431 11/18/16 0644 11/19/16 0630  Weight: 190 lb (86.2 kg) 182 lb 6.4 oz (82.7 kg) 181 lb 11.2 oz (82.4 kg)    Telemetry    I personally reviewed telemetry which shows coarse atrial fibrillation/flutter.  ECG    I personally reviewed the tracing from 11/17/2016 which shows sinus bradycardia with prolonged PR interval, low voltage, anterolateral T wave inversions prolonged QT interval.  Physical Exam   GEN: Obese woman, acute distress.   Neck: No JVD Cardiac: Irregularly irregular  rhythm. Tachycardic.  Respiratory: Clear to auscultation bilaterally. GI: Soft, nontender, non-distended  MS: No edema; No deformity.  Labs    Chemistry  Recent Labs Lab 11/17/16 0410 11/18/16 0730 11/19/16 0444  NA 134* 138 140  K 4.8 3.7 3.6  CL 96* 99* 96*  CO2 25 28 33*  GLUCOSE 106* 86 91  BUN 28* 25* 21*  CREATININE 1.83* 1.42* 1.44*  CALCIUM 9.4 9.5 9.1  GFRNONAA 25* 35* 34*  GFRAA 30* 40* 39*  ANIONGAP 13 11 11      Hematology  Recent Labs Lab 11/14/16 0500 11/16/16 0359 11/18/16 0730  WBC 7.1 7.6 6.4  RBC 4.72 4.92 5.01  HGB 12.9 13.4 13.7  HCT 41.4 43.2 43.8  MCV 87.7 87.8 87.4  MCH 27.3 27.2 27.3  MCHC 31.2 31.0 31.3  RDW 16.1* 15.6* 15.4  PLT 233 251 194    BNP  Recent Labs Lab 11/12/16 1551  BNP 769.8*     Radiology    No results found.  Cardiac Studies   TEE 11/16/2016: Study Conclusions  - Left ventricle:  LVEF Is moderately depressed - Mitral valve: MV is normal There is a least moderate to severe MR   directed posterior into LA. - Left atrium: No evidence of thrombus in the atrial cavity or   appendage.  Echocardiogram 11/01/2016: Study Conclusions  - Left ventricle: The cavity size was normal. There was moderate   concentric hypertrophy. Systolic function was moderately to   severely reduced. The estimated ejection fraction was 30%.   Diffuse hypokinesis. The study was not technically sufficient to   allow evaluation of LV diastolic dysfunction due to atrial   fibrillation. - Aortic valve: There was moderate regurgitation. - Aortic root: The aortic root was normal in size. - Ascending aorta: The ascending aorta was normal in size. - Mitral valve: There was moderate regurgitation. - Left atrium: The atrium was moderately dilated. - Right ventricle: Systolic function was mildly reduced. - Right atrium: The atrium was mildly dilated. - Pulmonary arteries: Systolic pressure was mildly increased. PA   peak pressure: 35 mm  Hg (S). - Inferior vena cava: The vessel was normal in size. - Pericardium, extracardiac: There was no pericardial effusion.   There was a left pleural effusion.  Impressions:  - When compared to the prior study from 03/09/2015 LVEF has   significantly decreased from 55-60% to 30-35%. LVEF is partially   underestimated secondary to atrial fibrillation with RVR during   the acquisition.  Patient Profile     76 y.o. female with a history of hypertension, hyperlipidemia, OSA, and paroxysmal atrial fibrillation. 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 Also has moderate to severe mitral regurgitation and moderate aortic regurgitation. She underwent TEE guided cardioversion 2/9 and had recurrent atrial fibrillation/flutter on 2/10.  Assessment & Plan    Paroxysmal atrial fibrillation: Status post successful TEE guided cardioversion on 2/9, but unfortunately recurrent atrial fibrillation/flutter on 2/10.  -Continue Xarelto 15 mg daily -Continue Amiodarone 400 mg BID -Increase Metoprolol succinate to 100 mg daily -Continue Metoprolol 2.5 mg IV q5 min prn tachycardia  -Will need f/u cardioversion as outpatient   Possible tachycardia-mediated cardiomyopathy: LVEF 30-35%. Net -2.7 L since admission.  -Continue Lasix 40 mg BID -Continue K-Dur 20 meq BID -Continue Metoprolol succinate 50 mg daily -Continue Metoprolol 2.5 mg IV q5 min prn tachycardia -Will need outpatient echo for reassessment of LVEF and valvular heart disease  Moderate to severe mitral regurgitation and moderate aortic regurgitation: Possible that mitral regurgitation might improve if LV dysfunction resolves in sinus rhythm and with medical therapy adjustments.  Hypertension: Blood pressure low normal at this time and does limit medication titration. -Holding off on ARB or ACE inhibitor with low-normal blood pressure and renal insufficiency  CKD, stage 3: Creatinine  down to 1.4.  Signed, Shela Leff, MD  11/19/2016, 9:08 AM    I have examined the patient and reviewed assessment and plan and discussed with patient.  Agree with above as stated.  Try for better rate control.  COntinue anticoagulation.  Plan cardioversion after amiodarone load.  MR may improve if LV function improves.   Will likely need outpatient cardioversion. Would like to try to avoid calcium channel blocker since there is LV dysfunction and her BP is not high.  COuld consider adding Digoxin if HR continues to be difficult.   Larae Grooms

## 2016-11-19 NOTE — Care Management Important Message (Signed)
Important Message  Patient Details  Name: Teresa York MRN: [id number] Date of Birth: 1941/01/08   Medicare Important Message Given:  Yes    Traylen Eckels [address] 11/19/2016, 4:17 PM

## 2016-11-20 ENCOUNTER — Encounter (HOSPITAL_COMMUNITY): Payer: Self-pay | Admitting: Emergency Medicine

## 2016-11-20 ENCOUNTER — Telehealth: Payer: Self-pay | Admitting: *Deleted

## 2016-11-20 DIAGNOSIS — Z7901 Long term (current) use of anticoagulants: Secondary | ICD-10-CM

## 2016-11-20 DIAGNOSIS — I10 Essential (primary) hypertension: Secondary | ICD-10-CM | POA: Diagnosis present

## 2016-11-20 DIAGNOSIS — E039 Hypothyroidism, unspecified: Secondary | ICD-10-CM | POA: Diagnosis present

## 2016-11-20 DIAGNOSIS — K2270 Barrett's esophagus without dysplasia: Secondary | ICD-10-CM

## 2016-11-20 DIAGNOSIS — I110 Hypertensive heart disease with heart failure: Secondary | ICD-10-CM

## 2016-11-20 DIAGNOSIS — I5022 Chronic systolic (congestive) heart failure: Secondary | ICD-10-CM | POA: Diagnosis present

## 2016-11-20 MED ORDER — LEVOTHYROXINE SODIUM 175 MCG PO TABS: 175.0000 ug | ORAL_TABLET | Freq: Every day | ORAL | 0 refills | Status: DC

## 2016-11-20 MED ORDER — RIVAROXABAN 15 MG PO TABS: ORAL_TABLET | ORAL | 3 refills | Status: DC

## 2016-11-20 MED ORDER — METOPROLOL SUCCINATE ER 100 MG PO TB24: ORAL_TABLET | ORAL | 3 refills | Status: DC

## 2016-11-20 MED ORDER — AMIODARONE HCL 400 MG PO TABS: 400.0000 mg | ORAL_TABLET | Freq: Every day | ORAL | 0 refills | Status: DC

## 2016-11-20 MED ORDER — POTASSIUM CHLORIDE CRYS ER 20 MEQ PO TBCR: 20.0000 meq | EXTENDED_RELEASE_TABLET | Freq: Two times a day (BID) | ORAL | 3 refills | Status: DC

## 2016-11-20 MED ORDER — FUROSEMIDE 40 MG PO TABS: 40.0000 mg | ORAL_TABLET | Freq: Two times a day (BID) | ORAL | 3 refills | Status: DC

## 2016-11-20 NOTE — Progress Notes (Signed)
Pt was offered to be given a bed bath. Pt stated she did not want one and that she was going to wash her face and brush her teeth in a few minutes. Pt stated she did not need help. This SN told her if she needed help to use call bell.

## 2016-11-20 NOTE — Telephone Encounter (Signed)
-----   Message from Eileen Stanford, PA-C sent at 11/20/2016 12:50 PM EST ----- Please call for Teresa York visit for 2/20 with [facility]

## 2016-11-20 NOTE — Progress Notes (Signed)
Progress Note  Patient Name: Teresa York Date of Encounter: 11/20/2016  Primary Cardiologist: Dr. Darlina Guys  Subjective  Denies palpitations. Denies having any CP, SOB, or lightheadedness.    Inpatient Medications    Scheduled Meds: . amiodarone  400 mg Oral BID  . ferrous sulfate  325 mg Oral Q breakfast  . FLUoxetine  40 mg Oral Daily  . furosemide  40 mg Oral BID  . levothyroxine  175 mcg Oral QAC breakfast  . loratadine  10 mg Oral Daily  . metoprolol succinate  100 mg Oral Daily  . pantoprazole  40 mg Oral Daily  . potassium chloride  20 mEq Oral BID  . Rivaroxaban  15 mg Oral Q supper  . sodium chloride flush  3 mL Intravenous Q12H   Continuous Infusions: . sodium chloride 250 mL (11/16/16 1029)   PRN Meds: acetaminophen, metoprolol, ondansetron (ZOFRAN) IV, sodium chloride flush, traZODone, zolpidem   Vital Signs    Vitals:   11/19/16 2029 11/19/16 2037 11/20/16 0511 11/20/16 0800  BP: 95/82  (!) 107/44 (!) 121/91  Pulse: 94  92 99  Resp: 20  20 16   Temp: 97.9 F (36.6 C)  98.1 F (36.7 C) 97.4 F (36.3 C)  TempSrc: Oral  Oral Oral  SpO2:  97% 98% 98%  Weight:   180 lb (81.6 kg)   Height:        Intake/Output Summary (Last 24 hours) at 11/20/16 1227 Last data filed at 11/20/16 0913  Gross per 24 hour  Intake             1436 ml  Output                0 ml  Net             1436 ml   Filed Weights   11/18/16 0644 11/19/16 0630 11/20/16 0511  Weight: 182 lb 6.4 oz (82.7 kg) 181 lb 11.2 oz (82.4 kg) 180 lb (81.6 kg)    Telemetry    I personally reviewed telemetry which shows coarse atrial fibrillation; improved rate control.  ECG      Physical Exam   GEN: Obese woman, acute distress.   Neck: No JVD Cardiac: Irregularly irregular rhythm. Tachycardic.  Respiratory: Clear to auscultation bilaterally. GI: Soft, nontender, non-distended  MS: No edema; No deformity. No edema Normal affect No rash  Labs    Chemistry  Recent  Labs Lab 11/17/16 0410 11/18/16 0730 11/19/16 0444  NA 134* 138 140  K 4.8 3.7 3.6  CL 96* 99* 96*  CO2 25 28 33*  GLUCOSE 106* 86 91  BUN 28* 25* 21*  CREATININE 1.83* 1.42* 1.44*  CALCIUM 9.4 9.5 9.1  GFRNONAA 25* 35* 34*  GFRAA 30* 40* 39*  ANIONGAP 13 11 11      Hematology  Recent Labs Lab 11/14/16 0500 11/16/16 0359 11/18/16 0730  WBC 7.1 7.6 6.4  RBC 4.72 4.92 5.01  HGB 12.9 13.4 13.7  HCT 41.4 43.2 43.8  MCV 87.7 87.8 87.4  MCH 27.3 27.2 27.3  MCHC 31.2 31.0 31.3  RDW 16.1* 15.6* 15.4  PLT 233 251 194    BNP No results for input(s): BNP, PROBNP in the last 168 hours.   Radiology    No results found.  Cardiac Studies   TEE 11/16/2016: Study Conclusions  - Left ventricle: LVEF Is moderately depressed - Mitral valve: MV is normal There is a least moderate to severe MR   directed  posterior into LA. - Left atrium: No evidence of thrombus in the atrial cavity or   appendage.  Echocardiogram 11/01/2016: Study Conclusions  - Left ventricle: The cavity size was normal. There was moderate   concentric hypertrophy. Systolic function was moderately to   severely reduced. The estimated ejection fraction was 30%.   Diffuse hypokinesis. The study was not technically sufficient to   allow evaluation of LV diastolic dysfunction due to atrial   fibrillation. - Aortic valve: There was moderate regurgitation. - Aortic root: The aortic root was normal in size. - Ascending aorta: The ascending aorta was normal in size. - Mitral valve: There was moderate regurgitation. - Left atrium: The atrium was moderately dilated. - Right ventricle: Systolic function was mildly reduced. - Right atrium: The atrium was mildly dilated. - Pulmonary arteries: Systolic pressure was mildly increased. PA   peak pressure: 35 mm Hg (S). - Inferior vena cava: The vessel was normal in size. - Pericardium, extracardiac: There was no pericardial effusion.   There was a left pleural  effusion.  Impressions:  - When compared to the prior study from 03/09/2015 LVEF has   significantly decreased from 55-60% to 30-35%. LVEF is partially   underestimated secondary to atrial fibrillation with RVR during   the acquisition.  Patient Profile     76 y.o. female with a history of hypertension, hyperlipidemia, OSA, and paroxysmal atrial fibrillation. 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 Also has moderate to severe mitral regurgitation and moderate aortic regurgitation. She underwent TEE guided cardioversion 2/9 and had recurrent atrial fibrillation/flutter on 2/10.  Assessment & Plan    Paroxysmal atrial fibrillation: Status post successful TEE guided cardioversion on 2/9, but unfortunately recurrent atrial fibrillation/flutter on 2/10.  -Continue Xarelto 15 mg daily -Decrease Amiodarone to 400 mg daily -Continue Metoprolol succinate to 100 mg daily   -Will need f/u cardioversion as outpatient   Possible tachycardia-mediated cardiomyopathy: LVEF 30-35%. Net -2.7 L since admission.  -Continue Lasix 40 mg BID;  BMet in one week after discharge -Continue K-Dur 20 meq BID  -Will need outpatient echo for reassessment of LVEF and valvular heart disease  Moderate to severe mitral regurgitation and moderate aortic regurgitation: Possible that mitral regurgitation might improve if LV dysfunction resolves in sinus rhythm and with medical therapy adjustments.  Hypertension: Blood pressure low normal at this time and does limit medication titration. -Holding off on ARB or ACE inhibitor with low-normal blood pressure and renal insufficiency  CKD, stage 3: Creatinine down to 1.4.  She has been ambulating in the hospital.  Plan discharge today with f/u in one week and cardioversion a few weeks after that.  Signed, Larae Grooms, MD  11/20/2016, 12:27 PM

## 2016-11-20 NOTE — Discharge Summary (Signed)
Discharge Summary    Patient ID: Teresa York,  MRN: [id number], DOB/AGE: 02/16/1941 76 y.o.  Admit date: 11/12/2016 Discharge date: 11/20/2016  Primary Care Provider: Maylon Peppers Primary Cardiologist: [facility]   Discharge Diagnoses    Principal Problem:   Atrial fibrillation with RVR ([place]) Active Problems:   Barrett's esophagus without dysplasia   Chronic anticoagulation   Acute on chronic combined systolic and diastolic CHF (congestive heart failure) (HCC)   Chronic systolic CHF (congestive heart failure) (HCC)   Hypertension   Hypothyroidism   Allergies Allergies  Allergen Reactions  . Benazepril Cough  . Diltiazem Other (See Comments)    Pt doesn't remember   . Diltiazem Hcl Rash     History of Present Illness     76 y/o female with PMH of hypertension, chronic systolic CHF, hyperlipidemia, OSA and paroxysmal atrial fibrillation. She presented to the cardiology office on 11/12/2016 for evaluation of SOB. She was noted to be in recurrent atrial firbrillation with RVR as well as A/C CHF and admitted to hospital for rate control and IV diuresis.  The patient saw Richardson Dopp, [place] 10/31/16 and was in atrial fibrillation with RVR and CHF. She was admitted to the hospital and diuresed with IV Lasix. 2-D echo was done in LVEF has significantly decreased from 55-60% to 30-35% felt partially underestimated secondary to A. fib. Patient was on Xarelto and underwent successful cardioversion. Plan was to repeat echo in 2 months and if it remains low consider cardiac catheterization. Also needs Barrett's esophagus treatment at [facility] but has to remain on anticoagulation for a minimum of 4 weeks post cardioversion. Of note she was not discharged with diuretic.  Patient was added onto [facility] schedule on 11/12/16 for evaluation of SOB.  Found to be in recurrent atrial fibrillation with RVR and volume overload and admitted to [facility] hospital for rate control and diuresis.   Hospital  Course     Consultants:  None  1. Afib with RVR: Seen in office and found to be in recurrent afib with RVR, she was admitted to hospital and started on Amiodarone load, Xarelto and Metoprolol continued. TSH noted to be suppressed and synthroid dose decreased by pharmacy to 175 mcg. Repeat TEE/DCCV on 11/13/16 was initially successful but had an eventual return of Afib/flutter during stay on 11/17/2016. - Xarelto 15 mg daily for CHADSVASC of at least 5 (CHF, HTN, age, F sex). Previously on Xarelto 20 mg but ClCr is 42. - Decrease Amiodarone 400 mg daily at discharge until seen in follow-up - Continue Metoprolol succinate to 100 mg daily - Will need f/u cardioversion as outpatient.   2. Acute on chronic combined S/D CHF: Patient started on IV lasix and diuresed well and transitioned to PO lasix and waned from oxygen requirement. Dry weight is 184, discharge weight is 180.  3. Supressed TSH: TSH has decreased: 0.036, was normal in 11/2013. Decreased synthroid dose 170mcg, per pharmacy. Follow up with PCP  4. Possible tachycardia-mediated Cardiomyopathy: LVEF 30-35%. Net 2.7 L since admission. Unclear etiology, possibly due to heart rate.  - Continue Lasix 40mg  BID; BMet in one week after discharge - Continue K-Dur 20 meq BID - Will need outpatient echo for reassessment of LVEF and valvular heart disease. If EF remains low may need further ischemic evaluation  5. Hypertension: Blood pressures noted to be sBP 100-120 and dBP 50-60 during stay. - Holding off on ACE/ARB with the low-normal BP and renal insufficiency  6. CKD, Stage 3:  Creatinine 1.8 initially, improved to 1.4 during visit. Will need to monitor while taking Lasix.  7. Hypomagnesemia: 1.4, magnesium replaced during this visit. _____________  Discharge Vitals Blood pressure 102/71, pulse 73, temperature 97.7 F (36.5 C), temperature source Oral, resp. rate 18, height 5\' 2"  (1.575 m), weight 180 lb (81.6 kg), SpO2 97 %.  Filed Weights     11/18/16 0644 11/19/16 0630 11/20/16 0511  Weight: 182 lb 6.4 oz (82.7 kg) 181 lb 11.2 oz (82.4 kg) 180 lb (81.6 kg)    Labs & Radiologic Studies     CBC  Recent Labs  11/18/16 0730  WBC 6.4  HGB 13.7  HCT 43.8  MCV 87.4  PLT [id number]   Basic Metabolic Panel  Recent Labs  11/18/16 0730 11/19/16 0444  NA 138 140  K 3.7 3.6  CL 99* 96*  CO2 28 33*  GLUCOSE 86 91  BUN 25* 21*  CREATININE 1.42* 1.44*  CALCIUM 9.5 9.1    No results found.   Diagnostic Studies/Procedures    TEE  11/16/16 - Left ventricle: LVEF Is moderately depressed - Mitral valve: MV is normal There is a least moderate to severe MR   directed posterior into LA. - Left atrium: No evidence of thrombus in the atrial cavity or   appendage. _____________   Disposition   Pt is being discharged home today in good condition.  Follow-up Plans & Appointments    Follow-up Information    Ermalinda Barrios, PA-C. Go on 11/27/2016.   Specialty:  Cardiology Why:  appointment time at 8:45 am please arrive 10 minutes early.  Contact information: [address] [place] [address] [phone]          Discharge Instructions    Diet - low sodium heart healthy    Complete by:  As directed    Increase activity slowly    Complete by:  As directed       Discharge Medications     Medication List    TAKE these medications   amiodarone 400 MG tablet Commonly known as:  PACERONE Take 1 tablet (400 mg total) by mouth daily.   cetirizine 10 MG tablet Commonly known as:  ZYRTEC Take 10 mg by mouth daily. Reported on 10/27/2015   D 1000 1000 units capsule Generic drug:  Cholecalciferol Take 1,000 Units by mouth daily. Reported on 11/03/2015   FLUoxetine 40 MG capsule Commonly known as:  PROZAC Take 40 mg by mouth daily.   furosemide 40 MG tablet Commonly known as:  LASIX Take 1 tablet (40 mg total) by mouth 2 (two) times daily.   levothyroxine 175 MCG tablet Commonly known  as:  SYNTHROID, LEVOTHROID Take 1 tablet (175 mcg total) by mouth daily before breakfast. What changed:  medication strength  how much to take  when to take this  additional instructions   metoprolol succinate 100 MG 24 hr tablet Commonly known as:  TOPROL-XL TAKE ONE TABLET BY MOUTH ONCE DAILY What changed:  medication strength   omeprazole 40 MG capsule Commonly known as:  PRILOSEC Take 40 mg by mouth 2 (two) times daily.   potassium chloride SA 20 MEQ tablet Commonly known as:  K-DUR,KLOR-CON Take 1 tablet (20 mEq total) by mouth 2 (two) times daily.   Rivaroxaban 15 MG Tabs tablet Commonly known as:  XARELTO TAKE ONE TABLET BY MOUTH ONCE DAILY WITH SUPPER What changed:  medication strength   SM IRON 325 (65 FE) MG tablet Generic drug:  ferrous sulfate Take 325 mg by mouth.   traZODone 150 MG tablet Commonly known as:  DESYREL Take 75 mg by mouth as needed for sleep.   VITAMIN B-1 PO Take 1 tablet by mouth daily.   zolpidem 5 MG tablet Commonly known as:  AMBIEN Take 5 mg by mouth at bedtime as needed for sleep.         Outstanding Labs/Studies  BMET  Duration of Discharge Encounter   Greater than 30 minutes including physician time.  Kristopher Glee PA-C 11/20/2016, 2:32 PM   I have examined the patient and reviewed assessment and plan and discussed with patient.  Agree with above as stated.  Better rate controlled.  COntinue increased beta blocker.  Decrease amiodarone.  Plan for cardioversion in a few weeks.  She has been walking without difficulty.  COntinue anticoagulation.  Cardiomyopathy should improve with better heart rate.  Mitral regurgitation would have to be reassessed as well with echo.    Larae Grooms

## 2016-11-21 NOTE — Telephone Encounter (Signed)
Patient contacted regarding discharge from [facility] on November 20, 2016.  Patient understands to follow up with provider Ermalinda Barrios, PA-C on November 27, 2016 at 8:45A at [facility]. Patient understands discharge instructions? yes Patient understands medications and regiment? yes Patient understands to bring all medications to this visit? yes

## 2016-11-26 ENCOUNTER — Encounter: Payer: Self-pay | Admitting: Physician Assistant

## 2016-11-27 ENCOUNTER — Ambulatory Visit (INDEPENDENT_AMBULATORY_CARE_PROVIDER_SITE_OTHER): Payer: Medicare HMO | Admitting: Physician Assistant

## 2016-11-27 ENCOUNTER — Encounter: Payer: Self-pay | Admitting: Physician Assistant

## 2016-11-27 VITALS — BP 122/62 | HR 92 | Ht 62.0 in | Wt 183.0 lb

## 2016-11-27 DIAGNOSIS — I5022 Chronic systolic (congestive) heart failure: Secondary | ICD-10-CM

## 2016-11-27 DIAGNOSIS — I10 Essential (primary) hypertension: Secondary | ICD-10-CM | POA: Diagnosis not present

## 2016-11-27 DIAGNOSIS — I481 Persistent atrial fibrillation: Secondary | ICD-10-CM | POA: Diagnosis not present

## 2016-11-27 DIAGNOSIS — I4819 Other persistent atrial fibrillation: Secondary | ICD-10-CM

## 2016-11-27 DIAGNOSIS — I351 Nonrheumatic aortic (valve) insufficiency: Secondary | ICD-10-CM

## 2016-11-27 LAB — BASIC METABOLIC PANEL
BUN/Creatinine Ratio: 15 (ref 12–28)
BUN: 19 mg/dL (ref 8–27)
CALCIUM: 9.6 mg/dL (ref 8.7–10.3)
CO2: 28 mmol/L (ref 18–29)
CREATININE: 1.29 mg/dL — AB (ref 0.57–1.00)
Chloride: 96 mmol/L (ref 96–106)
GFR calc Af Amer: 46 — ABNORMAL LOW (ref >59)
GFR, EST NON AFRICAN AMERICAN: 40 — AB (ref >59)
Glucose: 101 mg/dL — ABNORMAL HIGH (ref 65–99)
POTASSIUM: 3.6 mmol/L (ref 3.5–5.2)
Sodium: 139 mmol/L (ref 134–144)

## 2016-11-27 NOTE — Progress Notes (Signed)
Cardiology Office Note    Date:  11/27/2016   ID:  Teresa York, DOB [birth year], MRN [id number]  PCP:  Maylon Peppers, MD  Cardiologist: Dr. [name]  Chief Complaint  Patient presents with  . Follow-up    History of Present Illness:  Teresa York is a 76 y.o. female with a hx of HTN, HL, OSA, GERD, Barrett's esophagus, atrial fibrillation.  She has been managed with Xarelto for anticoagulation.  Last seen by Dr. Lauree Chandler in 9/17.    The patient saw Richardson Dopp, [place] 10/31/16 and was in atrial fibrillation with RVR and CHF. She was admitted to the hospital and diuresed with IV Lasix. 2-D echo was done in LVEF has significantly decreased from 55-60% to 30-35% felt partially underestimated secondary to A. fib. Patient was on Xarelto and underwent successful cardioversion. Plan was to repeat echo in 2 months and if it remains low consider cardiac catheterization. Also needs Barrett's esophagus treatment at [facility] but has to remain on anticoagulation for a minimum of 4 weeks post cardioversion. She diuresed 2.7 L overnight.  I saw the patient 11/12/16 with worsening dyspnea on exertion and her breathing had never improved since discharge. She was not sent home on a diuretic. She thinks she went back into atrial fibrillation 3 days later.  Admitted her to the hospital for amiodarone and IV Lasix. Her weight was 196 pounds that day and baseline weight is about 185 pounds. TSH was found to be suppressed in Synthroid dose was decreased. Repeat TEE/DC CV on 11/13/16 was initially successful but she eventually returned to A. fib/flutter during her stay. Amiodarone 400 mg at discharge. Plan was for follow-up cardioversion as outpatient. Discharge weight was 180 pounds. Creatinine was 1.8 initially and improved to 1.4. ACE inhibitor not initiated because of low normal blood pressure and renal insufficiency.  Patient comes in today feeling much better. She denies any rapid heart rates and her breathing  and edema has been stable. She is hoping to go to the beach for a few days.      Past Medical History:  Diagnosis Date  . Anxiety   . Atrial fibrillation with RVR ([place]) 10/31/2016   a. s/p failed DCCV x2. Now on amiodarone.   . Barrett's esophagus   . Breast cancer ([place]) 2005   "left"  . Chronic lower back pain   . Chronic systolic CHF (congestive heart failure) (HCC)    a. EF 30-35% felt to be possibly be tachy mediated from afib wtih RVR  . Depression   . Diverticulosis   . GERD (gastroesophageal reflux disease)   . Hiatal hernia   . Hypertension   . Hypothyroidism   . OAB (overactive bladder)   . 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)    Past Surgical History:  Procedure Laterality Date  . BACK SURGERY    . BREAST BIOPSY Left 2005  . BREAST LUMPECTOMY Left 2005  . CARDIOVERSION N/A 11/02/2016   Procedure: CARDIOVERSION;  Surgeon: Thayer Headings, MD;  Location: [place];  Service: Cardiovascular;  Laterality: N/A;  . CARDIOVERSION N/A 11/16/2016   Procedure: CARDIOVERSION;  Surgeon: Fay Records, MD;  Location: [place];  Service: Cardiovascular;  Laterality: N/A;  . CARPAL TUNNEL RELEASE Bilateral 1995  . COLONOSCOPY    . ELBOW FRACTURE SURGERY Right 2009   with implant  . ESOPHAGOGASTRODUODENOSCOPY    . ESOPHAGUS SURGERY     "in the process of getting cancerous cell off  my esophagus" (10/31/2016)  . FRACTURE SURGERY    . LUMBAR DISC SURGERY     L5  . MOHS SURGERY     "corner of right eye; on left cheek"  . TEE WITHOUT CARDIOVERSION N/A 11/16/2016   Procedure: TRANSESOPHAGEAL ECHOCARDIOGRAM (TEE);  Surgeon: Fay Records, MD;  Location: [place];  Service: Cardiovascular;  Laterality: N/A;  . TONSILLECTOMY    . TUBAL LIGATION  1980    Current Medications: Outpatient Medications Prior to Visit  Medication Sig Dispense Refill  . amiodarone (PACERONE) 400 MG tablet Take 1 tablet (400 mg total) by mouth daily. 30 tablet 0  .  cetirizine (ZYRTEC) 10 MG tablet Take 10 mg by mouth daily. Reported on 10/27/2015    . Cholecalciferol (D 1000) 1000 units capsule Take 1,000 Units by mouth daily. Reported on 11/03/2015    . ferrous sulfate (SM IRON) 325 (65 FE) MG tablet Take 325 mg by mouth.    Marland Kitchen FLUoxetine (PROZAC) 40 MG capsule Take 40 mg by mouth daily.     . furosemide (LASIX) 40 MG tablet Take 1 tablet (40 mg total) by mouth 2 (two) times daily. 60 tablet 3  . levothyroxine (SYNTHROID, LEVOTHROID) 175 MCG tablet Take 1 tablet (175 mcg total) by mouth daily before breakfast. 30 tablet 0  . metoprolol succinate (TOPROL-XL) 100 MG 24 hr tablet TAKE ONE TABLET BY MOUTH ONCE DAILY 30 tablet 3  . omeprazole (PRILOSEC) 40 MG capsule Take 40 mg by mouth 2 (two) times daily.    . potassium chloride SA (K-DUR,KLOR-CON) 20 MEQ tablet Take 1 tablet (20 mEq total) by mouth 2 (two) times daily. 60 tablet 3  . Rivaroxaban (XARELTO) 15 MG TABS tablet TAKE ONE TABLET BY MOUTH ONCE DAILY WITH SUPPER 30 tablet 3  . Thiamine HCl (VITAMIN B-1 PO) Take 1 tablet by mouth daily.    . traZODone (DESYREL) 150 MG tablet Take 75 mg by mouth as needed for sleep.     Marland Kitchen zolpidem (AMBIEN) 5 MG tablet Take 5 mg by mouth at bedtime as needed for sleep.      No facility-administered medications prior to visit.      Allergies:   Benazepril; Diltiazem; and Diltiazem hcl   Social History   Social History  . Marital status: Married    Spouse name: N/A  . Number of children: 2  . Years of education: N/A   Occupational History  . Retired Retired   Social History Main Topics  . Smoking status: Never Smoker  . Smokeless tobacco: Never Used  . Alcohol use No  . Drug use: No  . Sexual activity: Not Currently   Other Topics Concern  . Not on file   Social History Narrative  . No narrative on file     Family History:  The patient's family history includes Heart attack (age of onset: 86) in her father.   ROS:   Please see the history of  present illness.    Review of Systems  Musculoskeletal: Positive for arthritis, muscle weakness and stiffness.   All other systems reviewed and are negative.   PHYSICAL EXAM:   VS:  BP 122/62   Pulse 92   Ht 5\' 2"  (1.575 m)   Wt 183 lb (83 kg)   LMP  (LMP Unknown)   BMI 33.47 kg/m   Physical Exam  GEN: Well nourished, well developed, in no acute distress  Neck: no JVD, carotid bruits, or masses Cardiac:Irregular irregular; no murmurs, rubs,  or gallops  Respiratory:  clear to auscultation bilaterally, normal work of breathing GI: soft, nontender, nondistended, + BS Ext: without cyanosis, clubbing, or edema, Good distal pulses bilaterally Psych: euthymic mood, full affect  Wt Readings from Last 3 Encounters:  11/27/16 183 lb (83 kg)  11/20/16 180 lb (81.6 kg)  11/12/16 196 lb 1.9 oz (89 kg)      Studies/Labs Reviewed:   EKG:  EKG is ordered today.  The ekg ordered today demonstrates Atrial fibrillation at 92 bpm with nonspecific ST-T wave changes  Recent Labs: 10/31/2016: ALT 15 11/12/2016: B Natriuretic Peptide 769.8; TSH 0.036 11/16/2016: Magnesium 1.4 11/18/2016: Hemoglobin 13.7; Platelets 194 11/19/2016: BUN 21; Creatinine, Ser 1.44; Potassium 3.6; Sodium 140   Lipid Panel    Component Value Date/Time   CHOL 118 11/16/2016 0900   TRIG 77 11/16/2016 0900   HDL 37 (L) 11/16/2016 0900   CHOLHDL 3.2 11/16/2016 0900   VLDL 15 11/16/2016 0900   LDLCALC 66 11/16/2016 0900    Additional studies/ records that were reviewed today include:    TEE 11/16/2016: Study Conclusions   - Left ventricle: LVEF Is moderately depressed - Mitral valve: MV is normal There is a least moderate to severe MR   directed posterior into LA. - Left atrium: No evidence of thrombus in the atrial cavity or   appendage.   Echocardiogram 11/01/2016: Study Conclusions   - Left ventricle: The cavity size was normal. There was moderate   concentric hypertrophy. Systolic function was moderately  to   severely reduced. The estimated ejection fraction was 30%.   Diffuse hypokinesis. The study was not technically sufficient to   allow evaluation of LV diastolic dysfunction due to atrial   fibrillation. - Aortic valve: There was moderate regurgitation. - Aortic root: The aortic root was normal in size. - Ascending aorta: The ascending aorta was normal in size. - Mitral valve: There was moderate regurgitation. - Left atrium: The atrium was moderately dilated. - Right ventricle: Systolic function was mildly reduced. - Right atrium: The atrium was mildly dilated. - Pulmonary arteries: Systolic pressure was mildly increased. PA   peak pressure: 35 mm Hg (S). - Inferior vena cava: The vessel was normal in size. - Pericardium, extracardiac: There was no pericardial effusion.   There was a left pleural effusion.   Impressions:   - When compared to the prior study from 03/09/2015 LVEF has   significantly decreased from 55-60% to 30-35%. LVEF is partially   underestimated secondary to atrial fibrillation with RVR during   the acquisition.   ASSESSMENT:    1. Persistent atrial fibrillation ([place])   2. Chronic systolic CHF (congestive heart failure) ([place])   3. Aortic valve insufficiency, etiology of cardiac valve disease unspecified   4. Essential hypertension      PLAN:  In order of problems listed above:  Persistent atrial fibrillation now on amiodarone, metoprolol and Xarelto. Rate is better controlled. Plan is to continue amiodarone 400 mg daily for a couple more weeks and repeat cardioversion. Follow-up with myself or Dr. [name] in 3 weeks to schedule.  Chronic systolic heart failure now compensated. Hopefully her LV function will improve once she is in normal sinus rhythm. Will need repeat 2-D echo in several months. Check renal function today on Lasix.  Aortic insufficiency moderate  Essential hypertension controlled    Medication Adjustments/Labs and Tests  Ordered: Current medicines are reviewed at length with the patient today.  Concerns regarding medicines are outlined above.  Medication  changes, Labs and Tests ordered today are listed in the Patient Instructions below. Patient Instructions  Your physician recommends that you continue on your current medications as directed. Please refer to the Current Medication list given to you today.   Your physician recommends that you return for lab work in: [place] physician recommends that you schedule a follow-up appointment in:  [place]     Signed, Ermalinda Barrios, PA-C  11/27/2016 9:02 AM    [facility] [place], [place], [place]  [address] Phone: [phone]; Fax: [phone]

## 2016-11-27 NOTE — Patient Instructions (Signed)
Your physician recommends that you continue on your current medications as directed. Please refer to the Current Medication list given to you today.   Your physician recommends that you return for lab work in: [place] physician recommends that you schedule a follow-up appointment in:  [place]  Ermalinda Barrios PA

## 2016-12-17 ENCOUNTER — Encounter: Payer: Self-pay | Admitting: Physician Assistant

## 2016-12-17 ENCOUNTER — Ambulatory Visit (INDEPENDENT_AMBULATORY_CARE_PROVIDER_SITE_OTHER): Payer: Medicare HMO | Admitting: Physician Assistant

## 2016-12-17 ENCOUNTER — Encounter: Payer: Self-pay | Admitting: *Deleted

## 2016-12-17 VITALS — BP 112/70 | HR 108 | Ht 62.0 in | Wt 184.6 lb

## 2016-12-17 DIAGNOSIS — Z7901 Long term (current) use of anticoagulants: Secondary | ICD-10-CM

## 2016-12-17 DIAGNOSIS — K2270 Barrett's esophagus without dysplasia: Secondary | ICD-10-CM | POA: Diagnosis not present

## 2016-12-17 DIAGNOSIS — I481 Persistent atrial fibrillation: Secondary | ICD-10-CM | POA: Diagnosis not present

## 2016-12-17 DIAGNOSIS — I10 Essential (primary) hypertension: Secondary | ICD-10-CM

## 2016-12-17 DIAGNOSIS — I5022 Chronic systolic (congestive) heart failure: Secondary | ICD-10-CM

## 2016-12-17 DIAGNOSIS — I4819 Other persistent atrial fibrillation: Secondary | ICD-10-CM

## 2016-12-17 MED ORDER — RIVAROXABAN 15 MG PO TABS: ORAL_TABLET | ORAL | 3 refills | Status: DC

## 2016-12-17 MED ORDER — METOPROLOL SUCCINATE ER 100 MG PO TB24: ORAL_TABLET | ORAL | 3 refills | Status: DC

## 2016-12-17 NOTE — Progress Notes (Signed)
Cardiology Office Note    Date:  12/17/2016   ID:  Teresa York, DOB Jun 25, 1941, MRN [id number]  PCP:  Maylon Peppers, MD  Cardiologist: Dr. [name]  No chief complaint on file.   History of Present Illness:  Teresa York is a 76 y.o. female with a hx of HTN, HL, OSA, GERD, Teresa's esophagus, atrial fibrillation.  She has been managed with Xarelto for anticoagulation.  Last seen by Dr. Lauree Chandler in 9/17.    The patient saw Richardson Dopp, [place] 10/31/16 and was in atrial fibrillation with RVR and CHF. She was admitted to the hospital and diuresed with IV Lasix. 2-D echo was done in LVEF has significantly decreased from 55-60% to 30-35% felt partially underestimated secondary to A. fib. Patient was on Xarelto and underwent successful cardioversion. Plan was to repeat echo in 2 months and if it remains low consider cardiac catheterization. Also needs Teresa's esophagus treatment at [facility] but has to remain on anticoagulation for a minimum of 4 weeks post cardioversion. She diuresed 2.7 L overnight.   I saw the patient 11/12/16 with worsening dyspnea on exertion and her breathing had never improved since discharge. She was not sent home on a diuretic. She thinks she went back into atrial fibrillation 3 days later.   Admitted her to the hospital for amiodarone and IV Lasix. Her weight was 196 pounds that day and baseline weight is about 185 pounds. TSH was found to be suppressed in Synthroid dose was decreased. Repeat TEE/DC CV on 11/13/16 was initially successful but she eventually returned to A. fib/flutter during her stay. Amiodarone 400 mg at discharge. Plan was for follow-up cardioversion as outpatient. Discharge weight was 180 pounds. Creatinine was 1.8 initially and improved to 1.4. ACE inhibitor not initiated because of low normal blood pressure and renal insufficiency.  I saw the patient 11/12/16 and she was feeling much better. She was still in atrial fibrillation but her rate was better  controlled. She is active today to schedule repeat cardioversion. Heart failure was compensated. 2-D echo will need to be repeated in several months to follow-up LV function.Crt improved from 1.44 to 1.29.  Patient comes in today. Dyspnea is stable. Hasn't missed a Xarelto dose.       Past Medical History:  Diagnosis Date  . Anxiety   . Atrial fibrillation with RVR ([place]) 10/31/2016   a. s/p failed DCCV x2. Now on amiodarone.   . Teresa's esophagus   . Breast cancer ([place]) 2005   "left"  . Chronic lower back pain   . Chronic systolic CHF (congestive heart failure) (HCC)    a. EF 30-35% felt to be possibly be tachy mediated from afib wtih RVR  . Depression   . Diverticulosis   . GERD (gastroesophageal reflux disease)   . Hiatal hernia   . Hypertension   . Hypothyroidism   . OAB (overactive bladder)   . 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)    Past Surgical History:  Procedure Laterality Date  . BACK SURGERY    . BREAST BIOPSY Left 2005  . BREAST LUMPECTOMY Left 2005  . CARDIOVERSION N/A 11/02/2016   Procedure: CARDIOVERSION;  Surgeon: Thayer Headings, MD;  Location: [place];  Service: Cardiovascular;  Laterality: N/A;  . CARDIOVERSION N/A 11/16/2016   Procedure: CARDIOVERSION;  Surgeon: Fay Records, MD;  Location: [place];  Service: Cardiovascular;  Laterality: N/A;  . CARPAL TUNNEL RELEASE Bilateral 1995  . COLONOSCOPY    .  ELBOW FRACTURE SURGERY Right 2009   with implant  . ESOPHAGOGASTRODUODENOSCOPY    . ESOPHAGUS SURGERY     "in the process of getting cancerous cell off my esophagus" (10/31/2016)  . FRACTURE SURGERY    . LUMBAR DISC SURGERY     L5  . MOHS SURGERY     "corner of right eye; on left cheek"  . TEE WITHOUT CARDIOVERSION N/A 11/16/2016   Procedure: TRANSESOPHAGEAL ECHOCARDIOGRAM (TEE);  Surgeon: Fay Records, MD;  Location: [place];  Service: Cardiovascular;  Laterality: N/A;  . TONSILLECTOMY    . TUBAL LIGATION   1980    Current Medications: Outpatient Medications Prior to Visit  Medication Sig Dispense Refill  . amiodarone (PACERONE) 400 MG tablet Take 1 tablet (400 mg total) by mouth daily. 30 tablet 0  . cetirizine (ZYRTEC) 10 MG tablet Take 10 mg by mouth daily. Reported on 10/27/2015    . Cholecalciferol (D 1000) 1000 units capsule Take 1,000 Units by mouth daily. Reported on 11/03/2015    . ferrous sulfate (SM IRON) 325 (65 FE) MG tablet Take 325 mg by mouth daily.     Marland Kitchen FLUoxetine (PROZAC) 40 MG capsule Take 40 mg by mouth daily.     . furosemide (LASIX) 40 MG tablet Take 1 tablet (40 mg total) by mouth 2 (two) times daily. 60 tablet 3  . levothyroxine (SYNTHROID, LEVOTHROID) 175 MCG tablet Take 1 tablet (175 mcg total) by mouth daily before breakfast. 30 tablet 0  . omeprazole (PRILOSEC) 40 MG capsule Take 40 mg by mouth 2 (two) times daily.    . potassium chloride SA (K-DUR,KLOR-CON) 20 MEQ tablet Take 1 tablet (20 mEq total) by mouth 2 (two) times daily. 60 tablet 3  . Thiamine HCl (VITAMIN B-1 PO) Take 1 tablet by mouth daily.    . traZODone (DESYREL) 150 MG tablet Take 75 mg by mouth at bedtime as needed for sleep.     . metoprolol succinate (TOPROL-XL) 100 MG 24 hr tablet TAKE ONE TABLET BY MOUTH ONCE DAILY 30 tablet 3  . Rivaroxaban (XARELTO) 15 MG TABS tablet TAKE ONE TABLET BY MOUTH ONCE DAILY WITH SUPPER 30 tablet 3  . zolpidem (AMBIEN) 5 MG tablet Take 5 mg by mouth at bedtime as needed for sleep.      No facility-administered medications prior to visit.      Allergies:   Benazepril; Diltiazem; and Diltiazem hcl   Social History   Social History  . Marital status: Married    Spouse name: N/A  . Number of children: 2  . Years of education: N/A   Occupational History  . Retired Retired   Social History Main Topics  . Smoking status: Never Smoker  . Smokeless tobacco: Never Used  . Alcohol use No  . Drug use: No  . Sexual activity: Not Currently   Other Topics Concern    . None   Social History Narrative  . None     Family History:  The patient's family history includes Heart attack (age of onset: 55) in her father.   ROS:   Please see the history of present illness.    Review of Systems  Constitution: Negative.  HENT: Negative.   Eyes: Negative.   Cardiovascular: Positive for dyspnea on exertion, irregular heartbeat and leg swelling.  Respiratory: Negative.   Hematologic/Lymphatic: Negative.   Musculoskeletal: Negative.  Negative for joint pain.  Gastrointestinal: Negative.   Genitourinary: Negative.   Neurological: Negative.    All  other systems reviewed and are negative.   PHYSICAL EXAM:   VS:  BP 112/70   Pulse (!) 108   Ht 5\' 2"  (1.575 m)   Wt 184 lb 9.6 oz (83.7 kg)   LMP  (LMP Unknown)   BMI 33.76 kg/m   Physical Exam  GEN: Well nourished, well developed, in no acute distress  Neck: no JVD, carotid bruits, or masses Cardiac:irreg irreg 2/6 systolic murmur apex no  rubs, or gallops  Respiratory:  clear to auscultation bilaterally, normal work of breathing GI: soft, nontender, nondistended, + BS Ext: Trace of ankle edema without cyanosis, clubbing,Good distal pulses bilaterally MS: no deformity or atrophy  Skin: warm and dry, no rash Neuro:  Alert and Oriented x 3, Strength and sensation are intact Psych: euthymic mood, full affect  Wt Readings from Last 3 Encounters:  12/17/16 184 lb 9.6 oz (83.7 kg)  11/27/16 183 lb (83 kg)  11/20/16 180 lb (81.6 kg)      Studies/Labs Reviewed:   EKG:  EKG is ordered today.  The ekg ordered today demonstrates atrial flutter at 108/m Left anterior fascicular block, nonspecific ST-T wave changes  Recent Labs: 10/31/2016: ALT 15 11/12/2016: B Natriuretic Peptide 769.8; TSH 0.036 11/16/2016: Magnesium 1.4 11/18/2016: Hemoglobin 13.7; Platelets 194 11/27/2016: BUN 19; Creatinine, Ser 1.29; Potassium 3.6; Sodium 139   Lipid Panel    Component Value Date/Time   CHOL 118 11/16/2016 0900    TRIG 77 11/16/2016 0900   HDL 37 (L) 11/16/2016 0900   CHOLHDL 3.2 11/16/2016 0900   VLDL 15 11/16/2016 0900   LDLCALC 66 11/16/2016 0900    Additional studies/ records that were reviewed today include:   TEE 11/16/2016: Study Conclusions   - Left ventricle: LVEF Is moderately depressed - Mitral valve: MV is normal There is a least moderate to severe MR   directed posterior into LA. - Left atrium: No evidence of thrombus in the atrial cavity or   appendage.   Echocardiogram 11/01/2016: Study Conclusions   - Left ventricle: The cavity size was normal. There was moderate   concentric hypertrophy. Systolic function was moderately to   severely reduced. The estimated ejection fraction was 30%.   Diffuse hypokinesis. The study was not technically sufficient to   allow evaluation of LV diastolic dysfunction due to atrial   fibrillation. - Aortic valve: There was moderate regurgitation. - Aortic root: The aortic root was normal in size. - Ascending aorta: The ascending aorta was normal in size. - Mitral valve: There was moderate regurgitation. - Left atrium: The atrium was moderately dilated. - Right ventricle: Systolic function was mildly reduced. - Right atrium: The atrium was mildly dilated. - Pulmonary arteries: Systolic pressure was mildly increased. PA   peak pressure: 35 mm Hg (S). - Inferior vena cava: The vessel was normal in size. - Pericardium, extracardiac: There was no pericardial effusion.   There was a left pleural effusion.   Impressions:   - When compared to the prior study from 03/09/2015 LVEF has   significantly decreased from 55-60% to 30-35%. LVEF is partially   underestimated secondary to atrial fibrillation with RVR during   the acquisition.      ASSESSMENT:    1. Persistent atrial fibrillation ([place])   2. Chronic systolic CHF (congestive heart failure) ([place])   3. Essential hypertension   4. Chronic anticoagulation   5. 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  atrial fibrillation/flutter now with full amiodarone load and on Xarelto. She has not missed any doses.Dr. [name] spoke with patient and recommends repeat cardioversion. If this does not work follow up with EPS. Scheduled Monday.  Chronic systolic CHF compensated LVEF now 30-35%. Repeat echo in several months after cardioversion.  Essential hypertension controlled  Chronic anticoagulation with Xarelto  Teresa's esophagitis without dysplasia. Patient needs to have some work done by Dr. In [facility] and will have to hold Xarelto. I told her she has to wait at least a month after cardioversion. F/u with Dr. [name].     Medication Adjustments/Labs and Tests Ordered: Current medicines are reviewed at length with the patient today.  Concerns regarding medicines are outlined above.  Medication changes, Labs and Tests ordered today are listed in the Patient Instructions below. Patient Instructions  Medication Instructions:  Your physician recommends that you continue on your current medications as directed. Please refer to the Current Medication list given to you today.   Labwork: TODAY:  BMET, CBC & PT/INR  Testing/Procedures: Your physician has recommended that you have a Cardioversion (DCCV). Electrical Cardioversion uses a jolt of electricity to your heart either through paddles or wired patches attached to your chest. This is a controlled, usually prescheduled, procedure. Defibrillation is done under light anesthesia in the hospital, and you usually go home the day of the procedure. This is done to get your heart back into a normal rhythm. You are not awake for the procedure. Please see the instruction sheet given to you today.   Follow-Up: Your physician recommends that you schedule a follow-up appointment in: 2 WEEKS AFTER 12-24-16   Any Other Special Instructions Will Be Listed Below (If  Applicable).   Electrical Cardioversion Electrical cardioversion is the delivery of a jolt of electricity to restore a normal rhythm to the heart. A rhythm that is too fast or is not regular keeps the heart from pumping well. In this procedure, sticky patches or metal paddles are placed on the chest to deliver electricity to the heart from a device. This procedure may be done in an emergency if:  There is low or no blood pressure as a result of the heart rhythm.  Normal rhythm must be restored as fast as possible to protect the brain and heart from further damage.  It may save a life. This procedure may also be done for irregular or fast heart rhythms that are not immediately life-threatening. Tell a health care provider about:  Any allergies you have.  All medicines you are taking, including vitamins, herbs, eye drops, creams, and over-the-counter medicines.  Any problems you or family members have had with anesthetic medicines.  Any blood disorders you have.  Any surgeries you have had.  Any medical conditions you have.  Whether you are pregnant or may be pregnant. What are the risks? Generally, this is a safe procedure. However, problems may occur, including:  Allergic reactions to medicines.  A blood clot that breaks free and travels to other parts of your body.  The possible return of an abnormal heart rhythm within hours or days after the procedure.  Your heart stopping (cardiac arrest). This is rare. What happens before the procedure? Medicines   Your health care provider may have you start taking:  Blood-thinning medicines (anticoagulants) so your blood does not clot as easily.  Medicines may be given to help stabilize your heart rate and rhythm.  Ask your health care provider about changing or stopping your regular medicines.  This is especially important if you are taking diabetes medicines or blood thinners. General instructions   Plan to have someone take  you home from the hospital or clinic.  If you will be going home right after the procedure, plan to have someone with you for 24 hours.  Follow instructions from your health care provider about eating or drinking restrictions. What happens during the procedure?  To lower your risk of infection:  Your health care team will wash or sanitize their hands.  Your skin will be washed with soap.  An IV tube will be inserted into one of your veins.  You will be given a medicine to help you relax (sedative).  Sticky patches (electrodes) or metal paddles may be placed on your chest.  An electrical shock will be delivered. The procedure may vary among health care providers and hospitals. What happens after the procedure?  Your blood pressure, heart rate, breathing rate, and blood oxygen level will be monitored until the medicines you were given have worn off.  Do not drive for 24 hours if you were given a sedative.  Your heart rhythm will be watched to make sure it does not change. This information is not intended to replace advice given to you by your health care provider. Make sure you discuss any questions you have with your health care provider. Document Released: 09/14/2002 Document Revised: 05/23/2016 Document Reviewed: 03/30/2016 Elsevier Interactive Patient Education  2017 [employer].    If you need a refill on your cardiac medications before your next appointment, please call your pharmacy.      Sumner Boast, PA-C  12/17/2016 11:10 AM    [facility] [address], [place], [place]  [address] Phone: [phone]; Fax: [phone]   a

## 2016-12-17 NOTE — Patient Instructions (Signed)
Medication Instructions:  Your physician recommends that you continue on your current medications as directed. Please refer to the Current Medication list given to you today.   Labwork: TODAY:  BMET, CBC & PT/INR  Testing/Procedures: Your physician has recommended that you have a Cardioversion (DCCV). Electrical Cardioversion uses a jolt of electricity to your heart either through paddles or wired patches attached to your chest. This is a controlled, usually prescheduled, procedure. Defibrillation is done under light anesthesia in the hospital, and you usually go home the day of the procedure. This is done to get your heart back into a normal rhythm. You are not awake for the procedure. Please see the instruction sheet given to you today.   Follow-Up: Your physician recommends that you schedule a follow-up appointment in: 2 WEEKS AFTER 12-24-16   Any Other Special Instructions Will Be Listed Below (If Applicable).   Electrical Cardioversion Electrical cardioversion is the delivery of a jolt of electricity to restore a normal rhythm to the heart. A rhythm that is too fast or is not regular keeps the heart from pumping well. In this procedure, sticky patches or metal paddles are placed on the chest to deliver electricity to the heart from a device. This procedure may be done in an emergency if:  There is low or no blood pressure as a result of the heart rhythm.  Normal rhythm must be restored as fast as possible to protect the brain and heart from further damage.  It may save a life. This procedure may also be done for irregular or fast heart rhythms that are not immediately life-threatening. Tell a health care provider about:  Any allergies you have.  All medicines you are taking, including vitamins, herbs, eye drops, creams, and over-the-counter medicines.  Any problems you or family members have had with anesthetic medicines.  Any blood disorders you have.  Any surgeries you  have had.  Any medical conditions you have.  Whether you are pregnant or may be pregnant. What are the risks? Generally, this is a safe procedure. However, problems may occur, including:  Allergic reactions to medicines.  A blood clot that breaks free and travels to other parts of your body.  The possible return of an abnormal heart rhythm within hours or days after the procedure.  Your heart stopping (cardiac arrest). This is rare. What happens before the procedure? Medicines   Your health care provider may have you start taking:  Blood-thinning medicines (anticoagulants) so your blood does not clot as easily.  Medicines may be given to help stabilize your heart rate and rhythm.  Ask your health care provider about changing or stopping your regular medicines. This is especially important if you are taking diabetes medicines or blood thinners. General instructions   Plan to have someone take you home from the hospital or clinic.  If you will be going home right after the procedure, plan to have someone with you for 24 hours.  Follow instructions from your health care provider about eating or drinking restrictions. What happens during the procedure?  To lower your risk of infection:  Your health care team will wash or sanitize their hands.  Your skin will be washed with soap.  An IV tube will be inserted into one of your veins.  You will be given a medicine to help you relax (sedative).  Sticky patches (electrodes) or metal paddles may be placed on your chest.  An electrical shock will be delivered. The procedure may vary among health  care providers and hospitals. What happens after the procedure?  Your blood pressure, heart rate, breathing rate, and blood oxygen level will be monitored until the medicines you were given have worn off.  Do not drive for 24 hours if you were given a sedative.  Your heart rhythm will be watched to make sure it does not  change. This information is not intended to replace advice given to you by your health care provider. Make sure you discuss any questions you have with your health care provider. Document Released: 09/14/2002 Document Revised: 05/23/2016 Document Reviewed: 03/30/2016 Elsevier Interactive Patient Education  2017 [employer].    If you need a refill on your cardiac medications before your next appointment, please call your pharmacy.

## 2016-12-19 LAB — BASIC METABOLIC PANEL
BUN / CREAT RATIO: 25 (ref 12–28)
BUN: 26 mg/dL (ref 8–27)
CO2: 27 mmol/L (ref 18–29)
CREATININE: 1.04 mg/dL — AB (ref 0.57–1.00)
Calcium: 9.5 mg/dL (ref 8.7–10.3)
Chloride: 96 mmol/L (ref 96–106)
GFR calc non Af Amer: 52 mL/min/{1.73_m2} — ABNORMAL LOW (ref >59)
GFR, EST AFRICAN AMERICAN: 60 mL/min/{1.73_m2} (ref >59)
GLUCOSE: 79 mg/dL (ref 65–99)
Potassium: 4.2 mmol/L (ref 3.5–5.2)
SODIUM: 140 mmol/L (ref 134–144)

## 2016-12-19 LAB — PROTIME-INR
INR: 1.2 (ref 0.8–1.2)
Prothrombin Time: 12.6 s — ABNORMAL HIGH (ref 9.1–12.0)

## 2016-12-19 LAB — CBC
HEMATOCRIT: 45.1 % (ref 34.0–46.6)
Hemoglobin: 14.8 g/dL (ref 11.1–15.9)
MCH: 28 pg (ref 26.6–33.0)
MCHC: 32.8 g/dL (ref 31.5–35.7)
MCV: 85 fL (ref 79–97)
PLATELETS: 265 10*3/uL (ref 150–379)
RBC: 5.28 x10E6/uL (ref 3.77–5.28)
RDW: 15.1 % (ref 12.3–15.4)
WBC: 6.6 10*3/uL (ref 3.4–10.8)

## 2016-12-20 ENCOUNTER — Encounter: Payer: Self-pay | Admitting: Cardiovascular Disease

## 2016-12-24 ENCOUNTER — Ambulatory Visit (HOSPITAL_COMMUNITY): Payer: Medicare HMO | Admitting: Anesthesiology

## 2016-12-24 ENCOUNTER — Encounter (HOSPITAL_COMMUNITY)
Admission: RE | Disposition: A | Discharge status: Home / Self Care | Payer: Self-pay | Source: Ambulatory Visit | Attending: Cardiovascular Disease

## 2016-12-24 ENCOUNTER — Encounter (HOSPITAL_COMMUNITY): Payer: Self-pay | Admitting: *Deleted

## 2016-12-24 ENCOUNTER — Ambulatory Visit (HOSPITAL_COMMUNITY)
Admission: RE | Admit: 2016-12-24 | Discharge: 2016-12-24 | Disposition: A | Discharge status: Home / Self Care | Payer: Medicare HMO | Source: Ambulatory Visit | Attending: Cardiovascular Disease | Admitting: Cardiovascular Disease

## 2016-12-24 DIAGNOSIS — I481 Persistent atrial fibrillation: Secondary | ICD-10-CM | POA: Diagnosis not present

## 2016-12-24 DIAGNOSIS — Z853 Personal history of malignant neoplasm of breast: Secondary | ICD-10-CM | POA: Insufficient documentation

## 2016-12-24 DIAGNOSIS — K2270 Barrett's esophagus without dysplasia: Secondary | ICD-10-CM | POA: Diagnosis not present

## 2016-12-24 DIAGNOSIS — N3281 Overactive bladder: Secondary | ICD-10-CM | POA: Insufficient documentation

## 2016-12-24 DIAGNOSIS — Z888 Allergy status to other drugs, medicaments and biological substances status: Secondary | ICD-10-CM | POA: Diagnosis not present

## 2016-12-24 DIAGNOSIS — I4819 Other persistent atrial fibrillation: Secondary | ICD-10-CM

## 2016-12-24 DIAGNOSIS — K219 Gastro-esophageal reflux disease without esophagitis: Secondary | ICD-10-CM | POA: Insufficient documentation

## 2016-12-24 DIAGNOSIS — Z9889 Other specified postprocedural states: Secondary | ICD-10-CM | POA: Diagnosis not present

## 2016-12-24 DIAGNOSIS — F329 Major depressive disorder, single episode, unspecified: Secondary | ICD-10-CM | POA: Diagnosis not present

## 2016-12-24 DIAGNOSIS — G4733 Obstructive sleep apnea (adult) (pediatric): Secondary | ICD-10-CM | POA: Diagnosis not present

## 2016-12-24 DIAGNOSIS — Z8249 Family history of ischemic heart disease and other diseases of the circulatory system: Secondary | ICD-10-CM | POA: Diagnosis not present

## 2016-12-24 DIAGNOSIS — Z7901 Long term (current) use of anticoagulants: Secondary | ICD-10-CM | POA: Insufficient documentation

## 2016-12-24 DIAGNOSIS — I110 Hypertensive heart disease with heart failure: Secondary | ICD-10-CM | POA: Diagnosis not present

## 2016-12-24 DIAGNOSIS — N289 Disorder of kidney and ureter, unspecified: Secondary | ICD-10-CM | POA: Insufficient documentation

## 2016-12-24 DIAGNOSIS — Z9851 Tubal ligation status: Secondary | ICD-10-CM | POA: Insufficient documentation

## 2016-12-24 DIAGNOSIS — E039 Hypothyroidism, unspecified: Secondary | ICD-10-CM | POA: Insufficient documentation

## 2016-12-24 DIAGNOSIS — I4892 Unspecified atrial flutter: Secondary | ICD-10-CM | POA: Insufficient documentation

## 2016-12-24 DIAGNOSIS — K449 Diaphragmatic hernia without obstruction or gangrene: Secondary | ICD-10-CM | POA: Diagnosis not present

## 2016-12-24 DIAGNOSIS — F419 Anxiety disorder, unspecified: Secondary | ICD-10-CM | POA: Insufficient documentation

## 2016-12-24 DIAGNOSIS — I5022 Chronic systolic (congestive) heart failure: Secondary | ICD-10-CM | POA: Diagnosis not present

## 2016-12-24 DIAGNOSIS — Z79899 Other long term (current) drug therapy: Secondary | ICD-10-CM | POA: Diagnosis not present

## 2016-12-24 HISTORY — PX: CARDIOVERSION: SHX1299

## 2016-12-24 LAB — POCT I-STAT 4, (NA,K, GLUC, HGB,HCT)
Glucose, Bld: 101 mg/dL — ABNORMAL HIGH (ref 65–99)
HEMATOCRIT: 44 % (ref 36.0–46.0)
HEMOGLOBIN: 15 g/dL (ref 12.0–15.0)
Potassium: 4.9 mmol/L (ref 3.5–5.1)
Sodium: 139 mmol/L (ref 135–145)

## 2016-12-24 SURGERY — CARDIOVERSION
Anesthesia: General

## 2016-12-24 MED ORDER — HYDROCORTISONE 1 % EX CREA
1.0000 | TOPICAL_CREAM | Freq: Three times a day (TID) | CUTANEOUS | Status: DC | PRN
Start: 2016-12-24 — End: 2016-12-24

## 2016-12-24 MED ORDER — SODIUM CHLORIDE 0.9 % IV SOLN: 250.0000 mL | INTRAVENOUS | Status: DC

## 2016-12-24 MED ORDER — LIDOCAINE HCL (CARDIAC) 20 MG/ML IV SOLN
INTRAVENOUS | Status: DC | PRN
  Administered 2016-12-24: 60 mg via INTRATRACHEAL

## 2016-12-24 MED ORDER — SODIUM CHLORIDE 0.9% FLUSH: 3.0000 mL | Freq: Two times a day (BID) | INTRAVENOUS | Status: DC

## 2016-12-24 MED ORDER — PROPOFOL 10 MG/ML IV BOLUS
INTRAVENOUS | Status: DC | PRN
  Administered 2016-12-24: 60 mg via INTRAVENOUS

## 2016-12-24 MED ORDER — SODIUM CHLORIDE 0.9 % IV SOLN
250.0000 mL | INTRAVENOUS | Status: DC
  Administered 2016-12-24: 09:00:00 via INTRAVENOUS
  Administered 2016-12-24: 250 mL via INTRAVENOUS

## 2016-12-24 MED ORDER — SODIUM CHLORIDE 0.9% FLUSH: 3.0000 mL | INTRAVENOUS | Status: DC | PRN

## 2016-12-24 NOTE — Op Note (Signed)
Procedure: Electrical Cardioversion Indications:  Atrial Fibrillation  Procedure Details:  Consent: Risks of procedure as well as the alternatives and risks of each were explained to the (patient/caregiver).  Consent for procedure obtained.  Time Out: Verified patient identification, verified procedure, site/side was marked, verified correct patient position, special equipment/implants available, medications/allergies/relevent history reviewed, required imaging and test results available.  Performed  Patient placed on cardiac monitor, pulse oximetry, supplemental oxygen as necessary.  Sedation given: Propofol 60 mg IV, Dr. Marcie Bal Pacer pads placed anterior and posterior chest.  Cardioverted 1 time(s).  Cardioversion with synchronized biphasic 120J shock.  Evaluation: Findings: Post procedure EKG shows: sinus bradycardia 46 bpm Complications: None Patient did tolerate procedure well.  Time Spent Directly with the Patient:  30 minutes   Teresa York 12/24/2016, 9:13 AM

## 2016-12-24 NOTE — Anesthesia Postprocedure Evaluation (Addendum)
Anesthesia Post Note  Patient: Teresa York  Procedure(s) Performed: Procedure(s) (LRB): CARDIOVERSION (N/A)  Patient location during evaluation: PACU Anesthesia Type: General Level of consciousness: awake and alert and patient cooperative Pain management: pain level controlled Vital Signs Assessment: post-procedure vital signs reviewed and stable Respiratory status: spontaneous breathing and respiratory function stable Cardiovascular status: stable Anesthetic complications: no       Last Vitals:  Vitals:   12/24/16 0930 12/24/16 0940  BP: (!) 95/39 (!) 122/59  Pulse: (!) 50 (!) 55  Resp: 14 17  Temp:      Last Pain:  Vitals:   12/24/16 0921  TempSrc: Oral                 Patric Buckhalter S

## 2016-12-24 NOTE — Anesthesia Preprocedure Evaluation (Signed)
Anesthesia Evaluation  Patient identified by MRN, date of birth, ID band Patient awake    Reviewed: Allergy & Precautions, H&P , NPO status , Patient's Chart, lab work & pertinent test results  Airway Mallampati: II   Neck ROM: full    Dental   Pulmonary neg pulmonary ROS,    breath sounds clear to auscultation       Cardiovascular hypertension, + dysrhythmias Atrial Fibrillation  Rhythm:regular Rate:Normal     Neuro/Psych PSYCHIATRIC DISORDERS Anxiety Depression  Neuromuscular disease    GI/Hepatic hiatal hernia, GERD  ,  Endo/Other  Hypothyroidism obese  Renal/GU      Musculoskeletal   Abdominal   Peds  Hematology   Anesthesia Other Findings   Reproductive/Obstetrics                             Anesthesia Physical Anesthesia Plan  ASA: III  Anesthesia Plan: General   Post-op Pain Management:    Induction: Intravenous  Airway Management Planned: Mask  Additional Equipment:   Intra-op Plan:   Post-operative Plan:   Informed Consent: I have reviewed the patients History and Physical, chart, labs and discussed the procedure including the risks, benefits and alternatives for the proposed anesthesia with the patient or authorized representative who has indicated his/her understanding and acceptance.     Plan Discussed with: CRNA, Anesthesiologist and Surgeon  Anesthesia Plan Comments:         Anesthesia Quick Evaluation

## 2016-12-24 NOTE — H&P (View-Only) (Signed)
Cardiology Office Note    Date:  12/17/2016   ID:  Teresa York, DOB 1941-03-22, MRN [id number]  PCP:  Maylon Peppers, MD  Cardiologist: Dr. [name]  No chief complaint on file.   History of Present Illness:  Teresa York is a 76 y.o. female with a hx of HTN, HL, OSA, GERD, Barrett's esophagus, atrial fibrillation.  She has been managed with Xarelto for anticoagulation.  Last seen by Dr. Lauree Chandler in 9/17.    The patient saw Richardson Dopp, [place] 10/31/16 and was in atrial fibrillation with RVR and CHF. She was admitted to the hospital and diuresed with IV Lasix. 2-D echo was done in LVEF has significantly decreased from 55-60% to 30-35% felt partially underestimated secondary to A. fib. Patient was on Xarelto and underwent successful cardioversion. Plan was to repeat echo in 2 months and if it remains low consider cardiac catheterization. Also needs Barrett's esophagus treatment at [facility] but has to remain on anticoagulation for a minimum of 4 weeks post cardioversion. She diuresed 2.7 L overnight.   I saw the patient 11/12/16 with worsening dyspnea on exertion and her breathing had never improved since discharge. She was not sent home on a diuretic. She thinks she went back into atrial fibrillation 3 days later.   Admitted her to the hospital for amiodarone and IV Lasix. Her weight was 196 pounds that day and baseline weight is about 185 pounds. TSH was found to be suppressed in Synthroid dose was decreased. Repeat TEE/DC CV on 11/13/16 was initially successful but she eventually returned to A. fib/flutter during her stay. Amiodarone 400 mg at discharge. Plan was for follow-up cardioversion as outpatient. Discharge weight was 180 pounds. Creatinine was 1.8 initially and improved to 1.4. ACE inhibitor not initiated because of low normal blood pressure and renal insufficiency.  I saw the patient 11/12/16 and she was feeling much better. She was still in atrial fibrillation but her rate was better  controlled. She is active today to schedule repeat cardioversion. Heart failure was compensated. 2-D echo will need to be repeated in several months to follow-up LV function.Crt improved from 1.44 to 1.29.  Patient comes in today. Dyspnea is stable. Hasn't missed a Xarelto dose.       Past Medical History:  Diagnosis Date  . Anxiety   . Atrial fibrillation with RVR ([place]) 10/31/2016   a. s/p failed DCCV x2. Now on amiodarone.   . Barrett's esophagus   . Breast cancer ([place]) 2005   "left"  . Chronic lower back pain   . Chronic systolic CHF (congestive heart failure) ([facility])    a. EF 30-35% felt to be possibly be tachy mediated from afib wtih RVR  . Depression   . Diverticulosis   . GERD (gastroesophageal reflux disease)   . Hiatal hernia   . Hypertension   . Hypothyroidism   . OAB (overactive bladder)   . 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)    Past Surgical History:  Procedure Laterality Date  . BACK SURGERY    . BREAST BIOPSY Left 2005  . BREAST LUMPECTOMY Left 2005  . CARDIOVERSION N/A 11/02/2016   Procedure: CARDIOVERSION;  Surgeon: Thayer Headings, MD;  Location: [place];  Service: Cardiovascular;  Laterality: N/A;  . CARDIOVERSION N/A 11/16/2016   Procedure: CARDIOVERSION;  Surgeon: Fay Records, MD;  Location: [place];  Service: Cardiovascular;  Laterality: N/A;  . CARPAL TUNNEL RELEASE Bilateral 1995  . COLONOSCOPY    .  ELBOW FRACTURE SURGERY Right 2009   with implant  . ESOPHAGOGASTRODUODENOSCOPY    . ESOPHAGUS SURGERY     "in the process of getting cancerous cell off my esophagus" (10/31/2016)  . FRACTURE SURGERY    . LUMBAR DISC SURGERY     L5  . MOHS SURGERY     "corner of right eye; on left cheek"  . TEE WITHOUT CARDIOVERSION N/A 11/16/2016   Procedure: TRANSESOPHAGEAL ECHOCARDIOGRAM (TEE);  Surgeon: Fay Records, MD;  Location: [place];  Service: Cardiovascular;  Laterality: N/A;  . TONSILLECTOMY    . TUBAL LIGATION   1980    Current Medications: Outpatient Medications Prior to Visit  Medication Sig Dispense Refill  . amiodarone (PACERONE) 400 MG tablet Take 1 tablet (400 mg total) by mouth daily. 30 tablet 0  . cetirizine (ZYRTEC) 10 MG tablet Take 10 mg by mouth daily. Reported on 10/27/2015    . Cholecalciferol (D 1000) 1000 units capsule Take 1,000 Units by mouth daily. Reported on 11/03/2015    . ferrous sulfate (SM IRON) 325 (65 FE) MG tablet Take 325 mg by mouth daily.     Marland Kitchen FLUoxetine (PROZAC) 40 MG capsule Take 40 mg by mouth daily.     . furosemide (LASIX) 40 MG tablet Take 1 tablet (40 mg total) by mouth 2 (two) times daily. 60 tablet 3  . levothyroxine (SYNTHROID, LEVOTHROID) 175 MCG tablet Take 1 tablet (175 mcg total) by mouth daily before breakfast. 30 tablet 0  . omeprazole (PRILOSEC) 40 MG capsule Take 40 mg by mouth 2 (two) times daily.    . potassium chloride SA (K-DUR,KLOR-CON) 20 MEQ tablet Take 1 tablet (20 mEq total) by mouth 2 (two) times daily. 60 tablet 3  . Thiamine HCl (VITAMIN B-1 PO) Take 1 tablet by mouth daily.    . traZODone (DESYREL) 150 MG tablet Take 75 mg by mouth at bedtime as needed for sleep.     . metoprolol succinate (TOPROL-XL) 100 MG 24 hr tablet TAKE ONE TABLET BY MOUTH ONCE DAILY 30 tablet 3  . Rivaroxaban (XARELTO) 15 MG TABS tablet TAKE ONE TABLET BY MOUTH ONCE DAILY WITH SUPPER 30 tablet 3  . zolpidem (AMBIEN) 5 MG tablet Take 5 mg by mouth at bedtime as needed for sleep.      No facility-administered medications prior to visit.      Allergies:   Benazepril; Diltiazem; and Diltiazem hcl   Social History   Social History  . Marital status: Married    Spouse name: N/A  . Number of children: 2  . Years of education: N/A   Occupational History  . Retired Retired   Social History Main Topics  . Smoking status: Never Smoker  . Smokeless tobacco: Never Used  . Alcohol use No  . Drug use: No  . Sexual activity: Not Currently   Other Topics Concern    . None   Social History Narrative  . None     Family History:  The patient's family history includes Heart attack (age of onset: 41) in her father.   ROS:   Please see the history of present illness.    Review of Systems  Constitution: Negative.  HENT: Negative.   Eyes: Negative.   Cardiovascular: Positive for dyspnea on exertion, irregular heartbeat and leg swelling.  Respiratory: Negative.   Hematologic/Lymphatic: Negative.   Musculoskeletal: Negative.  Negative for joint pain.  Gastrointestinal: Negative.   Genitourinary: Negative.   Neurological: Negative.    All  other systems reviewed and are negative.   PHYSICAL EXAM:   VS:  BP 112/70   Pulse (!) 108   Ht 5\' 2"  (1.575 m)   Wt 184 lb 9.6 oz (83.7 kg)   LMP  (LMP Unknown)   BMI 33.76 kg/m   Physical Exam  GEN: Well nourished, well developed, in no acute distress  Neck: no JVD, carotid bruits, or masses Cardiac:irreg irreg 2/6 systolic murmur apex no  rubs, or gallops  Respiratory:  clear to auscultation bilaterally, normal work of breathing GI: soft, nontender, nondistended, + BS Ext: Trace of ankle edema without cyanosis, clubbing,Good distal pulses bilaterally MS: no deformity or atrophy  Skin: warm and dry, no rash Neuro:  Alert and Oriented x 3, Strength and sensation are intact Psych: euthymic mood, full affect  Wt Readings from Last 3 Encounters:  12/17/16 184 lb 9.6 oz (83.7 kg)  11/27/16 183 lb (83 kg)  11/20/16 180 lb (81.6 kg)      Studies/Labs Reviewed:   EKG:  EKG is ordered today.  The ekg ordered today demonstrates atrial flutter at 108/m Left anterior fascicular block, nonspecific ST-T wave changes  Recent Labs: 10/31/2016: ALT 15 11/12/2016: B Natriuretic Peptide 769.8; TSH 0.036 11/16/2016: Magnesium 1.4 11/18/2016: Hemoglobin 13.7; Platelets 194 11/27/2016: BUN 19; Creatinine, Ser 1.29; Potassium 3.6; Sodium 139   Lipid Panel    Component Value Date/Time   CHOL 118 11/16/2016 0900    TRIG 77 11/16/2016 0900   HDL 37 (L) 11/16/2016 0900   CHOLHDL 3.2 11/16/2016 0900   VLDL 15 11/16/2016 0900   LDLCALC 66 11/16/2016 0900    Additional studies/ records that were reviewed today include:   TEE 11/16/2016: Study Conclusions   - Left ventricle: LVEF Is moderately depressed - Mitral valve: MV is normal There is a least moderate to severe MR   directed posterior into LA. - Left atrium: No evidence of thrombus in the atrial cavity or   appendage.   Echocardiogram 11/01/2016: Study Conclusions   - Left ventricle: The cavity size was normal. There was moderate   concentric hypertrophy. Systolic function was moderately to   severely reduced. The estimated ejection fraction was 30%.   Diffuse hypokinesis. The study was not technically sufficient to   allow evaluation of LV diastolic dysfunction due to atrial   fibrillation. - Aortic valve: There was moderate regurgitation. - Aortic root: The aortic root was normal in size. - Ascending aorta: The ascending aorta was normal in size. - Mitral valve: There was moderate regurgitation. - Left atrium: The atrium was moderately dilated. - Right ventricle: Systolic function was mildly reduced. - Right atrium: The atrium was mildly dilated. - Pulmonary arteries: Systolic pressure was mildly increased. PA   peak pressure: 35 mm Hg (S). - Inferior vena cava: The vessel was normal in size. - Pericardium, extracardiac: There was no pericardial effusion.   There was a left pleural effusion.   Impressions:   - When compared to the prior study from 03/09/2015 LVEF has   significantly decreased from 55-60% to 30-35%. LVEF is partially   underestimated secondary to atrial fibrillation with RVR during   the acquisition.      ASSESSMENT:    1. Persistent atrial fibrillation ([place])   2. Chronic systolic CHF (congestive heart failure) ([place])   3. Essential hypertension   4. Chronic anticoagulation   5. Barrett's esophagus without  dysplasia      PLAN:  In order of problems listed above:  Asst.  atrial fibrillation/flutter now with full amiodarone load and on Xarelto. She has not missed any doses.Dr. [name] spoke with patient and recommends repeat cardioversion. If this does not work follow up with EPS. Scheduled Monday.  Chronic systolic CHF compensated LVEF now 30-35%. Repeat echo in several months after cardioversion.  Essential hypertension controlled  Chronic anticoagulation with Xarelto  Barrett's esophagitis without dysplasia. Patient needs to have some work done by Dr[facility] and will have to hold Xarelto. I told her she has to wait at least a month after cardioversion. F/u with Dr. [name].     Medication Adjustments/Labs and Tests Ordered: Current medicines are reviewed at length with the patient today.  Concerns regarding medicines are outlined above.  Medication changes, Labs and Tests ordered today are listed in the Patient Instructions below. Patient Instructions  Medication Instructions:  Your physician recommends that you continue on your current medications as directed. Please refer to the Current Medication list given to you today.   Labwork: TODAY:  BMET, CBC & PT/INR  Testing/Procedures: Your physician has recommended that you have a Cardioversion (DCCV). Electrical Cardioversion uses a jolt of electricity to your heart either through paddles or wired patches attached to your chest. This is a controlled, usually prescheduled, procedure. Defibrillation is done under light anesthesia in the hospital, and you usually go home the day of the procedure. This is done to get your heart back into a normal rhythm. You are not awake for the procedure. Please see the instruction sheet given to you today.   Follow-Up: Your physician recommends that you schedule a follow-up appointment in: 2 WEEKS AFTER 12-24-16   Any Other Special Instructions Will Be Listed Below (If  Applicable).   Electrical Cardioversion Electrical cardioversion is the delivery of a jolt of electricity to restore a normal rhythm to the heart. A rhythm that is too fast or is not regular keeps the heart from pumping well. In this procedure, sticky patches or metal paddles are placed on the chest to deliver electricity to the heart from a device. This procedure may be done in an emergency if:  There is low or no blood pressure as a result of the heart rhythm.  Normal rhythm must be restored as fast as possible to protect the brain and heart from further damage.  It may save a life. This procedure may also be done for irregular or fast heart rhythms that are not immediately life-threatening. Tell a health care provider about:  Any allergies you have.  All medicines you are taking, including vitamins, herbs, eye drops, creams, and over-the-counter medicines.  Any problems you or family members have had with anesthetic medicines.  Any blood disorders you have.  Any surgeries you have had.  Any medical conditions you have.  Whether you are pregnant or may be pregnant. What are the risks? Generally, this is a safe procedure. However, problems may occur, including:  Allergic reactions to medicines.  A blood clot that breaks free and travels to other parts of your body.  The possible return of an abnormal heart rhythm within hours or days after the procedure.  Your heart stopping (cardiac arrest). This is rare. What happens before the procedure? Medicines   Your health care provider may have you start taking:  Blood-thinning medicines (anticoagulants) so your blood does not clot as easily.  Medicines may be given to help stabilize your heart rate and rhythm.  Ask your health care provider about changing or stopping your regular medicines.  This is especially important if you are taking diabetes medicines or blood thinners. General instructions   Plan to have someone take  you home from the hospital or clinic.  If you will be going home right after the procedure, plan to have someone with you for 24 hours.  Follow instructions from your health care provider about eating or drinking restrictions. What happens during the procedure?  To lower your risk of infection:  Your health care team will wash or sanitize their hands.  Your skin will be washed with soap.  An IV tube will be inserted into one of your veins.  You will be given a medicine to help you relax (sedative).  Sticky patches (electrodes) or metal paddles may be placed on your chest.  An electrical shock will be delivered. The procedure may vary among health care providers and hospitals. What happens after the procedure?  Your blood pressure, heart rate, breathing rate, and blood oxygen level will be monitored until the medicines you were given have worn off.  Do not drive for 24 hours if you were given a sedative.  Your heart rhythm will be watched to make sure it does not change. This information is not intended to replace advice given to you by your health care provider. Make sure you discuss any questions you have with your health care provider. Document Released: 09/14/2002 Document Revised: 05/23/2016 Document Reviewed: 03/30/2016 Elsevier Interactive Patient Education  2017 [employer].    If you need a refill on your cardiac medications before your next appointment, please call your pharmacy.      Sumner Boast, PA-C  12/17/2016 11:10 AM    [facility] [address], [place], [place]  [address] Phone: [phone]; Fax: [phone]   a

## 2016-12-24 NOTE — Transfer of Care (Signed)
Immediate Anesthesia Transfer of Care Note  Patient: Teresa York  Procedure(s) Performed: Procedure(s): CARDIOVERSION (N/A)  Patient Location: Endoscopy Unit  Anesthesia Type:General  Level of Consciousness: sedated  Airway & Oxygen Therapy: Patient Spontanous Breathing and Patient connected to nasal cannula oxygen  Post-op Assessment: Report given to RN, Post -op Vital signs reviewed and stable and Patient moving all extremities  Post vital signs: Reviewed and stable  Last Vitals:  Vitals:   12/24/16 0746  BP: (!) 124/47  Pulse: (!) 105  Resp: (!) 22  Temp: 36.8 C    Last Pain:  Vitals:   12/24/16 0746  TempSrc: Oral         Complications: No apparent anesthesia complications

## 2016-12-24 NOTE — Discharge Instructions (Signed)
Electrical Cardioversion, Care After °This sheet gives you information about how to care for yourself after your procedure. Your health care provider may also give you more specific instructions. If you have problems or questions, contact your health care provider. °What can I expect after the procedure? °After the procedure, it is common to have: °· Some redness on the skin where the shocks were given. °Follow these instructions at home: °· Do not drive for 24 hours if you were given a medicine to help you relax (sedative). °· Take over-the-counter and prescription medicines only as told by your health care provider. °· Ask your health care provider how to check your pulse. Check it often. °· Rest for 48 hours after the procedure or as told by your health care provider. °· Avoid or limit your caffeine use as told by your health care provider. °Contact a health care provider if: °· You feel like your heart is beating too quickly or your pulse is not regular. °· You have a serious muscle cramp that does not go away. °Get help right away if: °· You have discomfort in your chest. °· You are dizzy or you feel faint. °· You have trouble breathing or you are short of breath. °· Your speech is slurred. °· You have trouble moving an arm or leg on one side of your body. °· Your fingers or toes turn cold or blue. °This information is not intended to replace advice given to you by your health care provider. Make sure you discuss any questions you have with your health care provider. °Document Released: 07/15/2013 Document Revised: 04/27/2016 Document Reviewed: 03/30/2016 °Elsevier Interactive Patient Education © 2017 Elsevier Inc. ° °

## 2016-12-24 NOTE — Interval H&P Note (Signed)
History and Physical Interval Note:  12/24/2016 8:39 AM  Teresa York  has presented today for surgery, with the diagnosis of AFIB, AFLUTTER  The various methods of treatment have been discussed with the patient and family. After consideration of risks, benefits and other options for treatment, the patient has consented to  Procedure(s): CARDIOVERSION (N/A) as a surgical intervention .  The patient's history has been reviewed, patient examined, no change in status, stable for surgery.  I have reviewed the patient's chart and labs.  Questions were answered to the patient's satisfaction.     Cesare Sumlin

## 2016-12-25 ENCOUNTER — Encounter (HOSPITAL_COMMUNITY): Payer: Self-pay | Admitting: Cardiovascular Disease

## 2016-12-27 ENCOUNTER — Other Ambulatory Visit: Payer: Self-pay | Admitting: *Deleted

## 2016-12-27 MED ORDER — AMIODARONE HCL 400 MG PO TABS: 400.0000 mg | ORAL_TABLET | Freq: Every day | ORAL | 2 refills | Status: DC

## 2016-12-27 NOTE — Telephone Encounter (Signed)
Patient is requesting a ninety day refill on amiodarone 400 mg. She stated that she has an appointment with this coming Monday with Dr [name] but does not have enough medication to last until then. I am questioning as I did see that a reduction in dose was mentioned. Please advise. Thanks, MI

## 2016-12-27 NOTE — Telephone Encounter (Signed)
OK to refill for 90 day supply if pt prefers this

## 2016-12-31 ENCOUNTER — Ambulatory Visit (INDEPENDENT_AMBULATORY_CARE_PROVIDER_SITE_OTHER): Payer: Medicare HMO | Admitting: Cardiovascular Disease

## 2016-12-31 ENCOUNTER — Encounter (INDEPENDENT_AMBULATORY_CARE_PROVIDER_SITE_OTHER): Payer: Self-pay

## 2016-12-31 ENCOUNTER — Encounter: Payer: Self-pay | Admitting: Cardiovascular Disease

## 2016-12-31 VITALS — BP 102/68 | HR 99 | Ht 62.0 in | Wt 183.6 lb

## 2016-12-31 DIAGNOSIS — I480 Paroxysmal atrial fibrillation: Secondary | ICD-10-CM | POA: Diagnosis not present

## 2016-12-31 DIAGNOSIS — I351 Nonrheumatic aortic (valve) insufficiency: Secondary | ICD-10-CM

## 2016-12-31 DIAGNOSIS — I5022 Chronic systolic (congestive) heart failure: Secondary | ICD-10-CM | POA: Diagnosis not present

## 2016-12-31 DIAGNOSIS — I428 Other cardiomyopathies: Secondary | ICD-10-CM | POA: Diagnosis not present

## 2016-12-31 NOTE — Patient Instructions (Signed)
Medication Instructions:  Your physician recommends that you continue on your current medications as directed. Please refer to the Current Medication list given to you today.   Labwork: none  Testing/Procedures: none  Follow-Up: You have been referred to Dr. Rayann Heman.  Please schedule patient for new patient appointment  Your physician recommends that you schedule a follow-up appointment in: 6 months with Dr. [name].  Please call our office in about 3 months to schedule this appointment.    Any Other Special Instructions Will Be Listed Below (If Applicable).     If you need a refill on your cardiac medications before your next appointment, please call your pharmacy.

## 2016-12-31 NOTE — Progress Notes (Signed)
Chief Complaint  Patient presents with  . Follow-up     History of Present Illness: 76 yo female with history of HTN, HLD, OSA, GERD, Barrett's esophagitis and paroxysmal atrial fibrillation who is here today for cardiac follow up. I saw her as a new patient 11/17/13 for evaluation and further management of her atrial fibrillation. She was seen in her primary care office 10/27/13 and routine EKG demonstrated atrial fibrillation with HR of 141 bpm. She had no awareness of irregularity of her heart and no other symptoms. I started Xarelto and Toprol. Echo in 2015 with normal LV function, mild to moderate AI, LAE. She was seen in our office January 2018 and was in atrial fib with RVR and she was volume overloaded. She was admitted to [facility] and diuresed. Echo January 2018 with LVEF=30-35%. She was cardioverted and sent home without Lasix. She was readmitted to [facility] 11/12/16 with volume overload and was again in atrial fib with RVR. She was loaded with IV amiodarone, diuresed and cardioverted but converted back to atrial fib before discharge. She was discharged on po amiodarone and Toprol. She was seen here in our office by Estella Husk, PA-C on 12/17/16 and was doing well but still in atrial fib. Repeat DCCV on 12/24/16 and she was converted to sinus.   She is here for follow up. She is feeling well. She does not feel palpitations. No chest pain, SOB, near syncope or syncope. No LE edema. She has no bleeding issues on Xarelto. Her energy level is close to normal. She does have joint aches and pains.   EKG today with atrial fib.   Primary Care Physician: Maylon Peppers, MD  Past Medical History:  Diagnosis Date  . Anxiety   . Atrial fibrillation with RVR ([place]) 10/31/2016   a. s/p failed DCCV x2. Now on amiodarone.   . Barrett's esophagus   . Breast cancer ([place]) 2005   "left"  . Chronic lower back pain   . Chronic systolic CHF (congestive heart failure) (HCC)    a. EF 30-35% felt to be possibly be  tachy mediated from afib wtih RVR  . Depression   . Diverticulosis   . GERD (gastroesophageal reflux disease)   . Hiatal hernia   . Hypertension   . Hypothyroidism   . OAB (overactive bladder)   . 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)    Past Surgical History:  Procedure Laterality Date  . BACK SURGERY    . BREAST BIOPSY Left 2005  . BREAST LUMPECTOMY Left 2005  . CARDIOVERSION N/A 11/02/2016   Procedure: CARDIOVERSION;  Surgeon: Thayer Headings, MD;  Location: [place];  Service: Cardiovascular;  Laterality: N/A;  . CARDIOVERSION N/A 11/16/2016   Procedure: CARDIOVERSION;  Surgeon: Fay Records, MD;  Location: [place];  Service: Cardiovascular;  Laterality: N/A;  . CARDIOVERSION N/A 12/24/2016   Procedure: CARDIOVERSION;  Surgeon: Sanda Klein, MD;  [facility];  Service: Cardiovascular;  Laterality: N/A;  . CARPAL TUNNEL RELEASE Bilateral 1995  . COLONOSCOPY    . ELBOW FRACTURE SURGERY Right 2009   with implant  . ESOPHAGOGASTRODUODENOSCOPY    . ESOPHAGUS SURGERY     "in the process of getting cancerous cell off my esophagus" (10/31/2016)  . FRACTURE SURGERY    . LUMBAR DISC SURGERY     L5  . MOHS SURGERY     "corner of right eye; on left cheek"  . TEE WITHOUT CARDIOVERSION N/A 11/16/2016  Procedure: TRANSESOPHAGEAL ECHOCARDIOGRAM (TEE);  Surgeon: Fay Records, MD;  Location: [place];  Service: Cardiovascular;  Laterality: N/A;  . TONSILLECTOMY    . TUBAL LIGATION  1980    Current Outpatient Prescriptions  Medication Sig Dispense Refill  . amiodarone (PACERONE) 400 MG tablet Take 1 tablet (400 mg total) by mouth daily. 90 tablet 2  . cetirizine (ZYRTEC) 10 MG tablet Take 10 mg by mouth daily. Reported on 10/27/2015    . Cholecalciferol (D 1000) 1000 units capsule Take 1,000 Units by mouth daily. Reported on 11/03/2015    . ferrous sulfate (SM IRON) 325 (65 FE) MG tablet Take 325 mg by mouth daily.     Marland Kitchen FLUoxetine (PROZAC) 40  MG capsule Take 40 mg by mouth daily.     . furosemide (LASIX) 40 MG tablet Take 1 tablet (40 mg total) by mouth 2 (two) times daily. 60 tablet 3  . levothyroxine (SYNTHROID, LEVOTHROID) 175 MCG tablet Take 1 tablet (175 mcg total) by mouth daily before breakfast. 30 tablet 0  . metoprolol succinate (TOPROL-XL) 100 MG 24 hr tablet TAKE ONE TABLET BY MOUTH ONCE DAILY 90 tablet 3  . omeprazole (PRILOSEC) 40 MG capsule Take 40 mg by mouth 2 (two) times daily.    . potassium chloride SA (K-DUR,KLOR-CON) 20 MEQ tablet Take 1 tablet (20 mEq total) by mouth 2 (two) times daily. 60 tablet 3  . Rivaroxaban (XARELTO) 15 MG TABS tablet TAKE ONE TABLET BY MOUTH ONCE DAILY WITH SUPPER 90 tablet 3  . Thiamine HCl (VITAMIN B-1 PO) Take 1 tablet by mouth daily.    . traZODone (DESYREL) 150 MG tablet Take 75 mg by mouth at bedtime as needed for sleep.      No current facility-administered medications for this visit.     Allergies  Allergen Reactions  . Benazepril Cough  . Diltiazem Other (See Comments)    Pt doesn't remember   . Diltiazem Hcl Rash    Social History   Social History  . Marital status: Married    Spouse name: N/A  . Number of children: 2  . Years of education: N/A   Occupational History  . Retired Retired   Social History Main Topics  . Smoking status: Never Smoker  . Smokeless tobacco: Never Used  . Alcohol use No  . Drug use: No  . Sexual activity: Not Currently   Other Topics Concern  . Not on file   Social History Narrative  . No narrative on file    Family History  Problem Relation Age of Onset  . Heart attack Father 95  . Colon cancer Neg Hx   . Esophageal cancer Neg Hx   . Stomach cancer Neg Hx     Review of Systems:  As stated in the HPI and otherwise negative.   BP 102/68   Pulse 99   Ht 5\' 2"  (1.575 m)   Wt 183 lb 9.6 oz (83.3 kg)   LMP  (LMP Unknown)   SpO2 95%   BMI 33.58 kg/m   Physical Examination: General: Well developed, well nourished,  NAD  HEENT: OP clear, mucus membranes moist  SKIN: warm, dry. No rashes. Neuro: No focal deficits  Musculoskeletal: Muscle strength 5/5 all ext  Psychiatric: Mood and affect normal  Neck: No JVD, no carotid bruits, no thyromegaly, no lymphadenopathy.  Lungs:Clear bilaterally, no wheezes, rhonci, crackles Cardiovascular: Irreg irreg with Subtle diastolic murmur. No gallops or rubs. Abdomen:Soft. Bowel sounds present. Non-tender.  Extremities:  No lower extremity edema. Pulses are 2 + in the bilateral DP/PT.  EKG:  EKG is ordered today. The ekg ordered today demonstrates Atrial fib, rate 99 bpm. LAFB. Non-specific T wave abn  Recent Labs: 10/31/2016: ALT 15 11/12/2016: B Natriuretic Peptide 769.8; TSH 0.036 11/16/2016: Magnesium 1.4 12/17/2016: BUN 26; Creatinine, Ser 1.04; Platelets 265 12/24/2016: Hemoglobin 15.0; Potassium 4.9; Sodium 139   Lipid Panel    Component Value Date/Time   CHOL 118 11/16/2016 0900   TRIG 77 11/16/2016 0900   HDL 37 (L) 11/16/2016 0900   CHOLHDL 3.2 11/16/2016 0900   VLDL 15 11/16/2016 0900   LDLCALC 66 11/16/2016 0900     Wt Readings from Last 3 Encounters:  12/31/16 183 lb 9.6 oz (83.3 kg)  12/17/16 184 lb 9.6 oz (83.7 kg)  11/27/16 183 lb (83 kg)     Other studies Reviewed: Additional studies/ records that were reviewed today include: . Review of the above records demonstrates:    Assessment and Plan:   1. Atrial fibrillation, paroxysmal: She is in atrial fibrillation today with heart rate in the 90s. Continue amiodarone and Toprol for rate control and Xarelto for anti-coagulation. She has now had 3 failed cardioversions over the last two months. I will refer her to EP to discuss other treatment strategies.   2. Aortic valve insufficiency: Moderate by echo February 2018.   3. Non-ischemic cardiomyopathy: LVEF=35% by echo February 2018. Hopefully this will improve with maintenance of sinus rhythm. Will repeat echo after EP evaluation and sinus  restored for several months.      4. Chronic systolic CHF: Her weight is stable. Volume status is ok. Will continue Lasix 40 mg po BID. She will take extra Lasix as needed for weight gain or LE edema.   Current medicines are reviewed at length with the patient today.  The patient does not have concerns regarding medicines.  The following changes have been made:  no change  Labs/ tests ordered today include:   Orders Placed This Encounter  Procedures  . EKG 12-Lead     Disposition:   FU with me in 6  months   Signed, Lauree Chandler, MD 12/31/2016 3:22 PM    [facility] [place], [place], [place]  [address] Phone: [phone]; Fax: [phone]

## 2017-01-02 ENCOUNTER — Encounter: Payer: Self-pay | Admitting: Internal Medicine

## 2017-01-14 ENCOUNTER — Encounter: Payer: Self-pay | Admitting: Internal Medicine

## 2017-01-30 ENCOUNTER — Encounter: Payer: Self-pay | Admitting: Internal Medicine

## 2017-01-30 ENCOUNTER — Ambulatory Visit (INDEPENDENT_AMBULATORY_CARE_PROVIDER_SITE_OTHER): Payer: Medicare HMO | Admitting: Internal Medicine

## 2017-01-30 ENCOUNTER — Encounter (INDEPENDENT_AMBULATORY_CARE_PROVIDER_SITE_OTHER): Payer: Self-pay

## 2017-01-30 VITALS — BP 112/68 | HR 49 | Ht 62.0 in | Wt 190.8 lb

## 2017-01-30 DIAGNOSIS — I10 Essential (primary) hypertension: Secondary | ICD-10-CM

## 2017-01-30 DIAGNOSIS — I481 Persistent atrial fibrillation: Secondary | ICD-10-CM | POA: Diagnosis not present

## 2017-01-30 DIAGNOSIS — I480 Paroxysmal atrial fibrillation: Secondary | ICD-10-CM

## 2017-01-30 DIAGNOSIS — I428 Other cardiomyopathies: Secondary | ICD-10-CM | POA: Diagnosis not present

## 2017-01-30 DIAGNOSIS — R0683 Snoring: Secondary | ICD-10-CM

## 2017-01-30 DIAGNOSIS — I4819 Other persistent atrial fibrillation: Secondary | ICD-10-CM

## 2017-01-30 DIAGNOSIS — I5022 Chronic systolic (congestive) heart failure: Secondary | ICD-10-CM

## 2017-01-30 DIAGNOSIS — K2270 Barrett's esophagus without dysplasia: Secondary | ICD-10-CM

## 2017-01-30 MED ORDER — RIVAROXABAN 20 MG PO TABS: 20.0000 mg | ORAL_TABLET | Freq: Every day | ORAL | 11 refills | Status: DC

## 2017-01-30 MED ORDER — AMIODARONE HCL 400 MG PO TABS: 200.0000 mg | ORAL_TABLET | Freq: Every day | ORAL | 2 refills | Status: DC

## 2017-01-30 NOTE — Patient Instructions (Addendum)
Medication Instructions:  Your physician has recommended you make the following change in your medication:  1) Decrease Amiodarone to 200 mg daily 2) Increase Xarelto to 20 mg daily   Labwork: Your physician recommends that you return for lab work prior to ablation----   Testing/Procedures: Your physician has requested that you have a TEE. During a TEE, sound waves are used to create images of your heart. It provides your doctor with information about the size and shape of your heart and how well your heart's chambers and valves are working. In this test, a transducer is attached to the end of a flexible tube that's guided down your throat and into your esophagus (the tube leading from you mouth to your stomach) to get a more detailed image of your heart. You are not awake for the procedure. Please see the instruction sheet given to you today. For further information please visit [email or web address].   Your physician has recommended that you have an ablation. Catheter ablation is a medical procedure used to treat some cardiac arrhythmias (irregular heartbeats). During catheter ablation, a long, thin, flexible tube is put into a blood vessel in your groin (upper thigh), or neck. This tube is called an ablation catheter. It is then guided to your heart through the blood vessel. Radio frequency waves destroy small areas of heart tissue where abnormal heartbeats may cause an arrhythmia to start. Please see the instruction sheet given to you today.  Please call when ready to schedule both test   Follow-Up:  You have been referred to Dr Princella Ion snoring  Your physician recommends that you schedule a follow-up appointment ---this will be scheduled after the ablation   Any Other Special Instructions Will Be Listed Below (If Applicable).     If you need a refill on your cardiac medications before your next appointment, please call your pharmacy.

## 2017-01-30 NOTE — Progress Notes (Signed)
Electrophysiology Office Note   Date:  01/30/2017   ID:  Teresa York, DOB 01-01-1941, MRN [id number]  PCP:  Maylon Peppers, MD  Cardiologist:  Dr [name] Primary Electrophysiologist: Thompson Grayer, MD    Chief Complaint  Patient presents with  . Atrial Fibrillation     History of Present Illness: Teresa York is a 76 y.o. female who presents today for electrophysiology evaluation.  She is referred by Dr [name] for EP consultation regarding afib management.  The patient reports having episodic afib for several years.  Over the past few months, her atrial fibrillation has been refractory to medical therapy.  She has had 3 unsuccessful cardioversion.  She reports SOB and fatigue with her afib.  She has reduced EF and MR which are felt to be tachycardia mediated.  She recently converted to sinus rhythm with high dose amiodarone and now feels "much better".  Today, she denies symptoms of palpitations, chest pain,  orthopnea, PND, lower extremity edema, claudication, dizziness, presyncope, syncope, bleeding, or neurologic sequela. The patient is tolerating medications without difficulties and is otherwise without complaint today.    Past Medical History:  Diagnosis Date  . Anxiety   . Barrett's esophagus   . Breast cancer ([place]) 2005   "left"  . Chronic lower back pain   . Chronic systolic CHF (congestive heart failure) (HCC)    a. EF 30-35% felt to be possibly be tachy mediated from afib wtih RVR  . Depression   . Diverticulosis   . GERD (gastroesophageal reflux disease)   . Hiatal hernia   . Hypertension   . Hypothyroidism   . Left atrial enlargement   . Mitral regurgitation   . OAB (overactive bladder)   . Persistent atrial fibrillation ([place]) 10/31/2016   a. s/p failed DCCV x3. Now on amiodarone.   . 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)   Past Surgical History:  Procedure Laterality Date  . BACK SURGERY    . BREAST BIOPSY Left 2005  . BREAST  LUMPECTOMY Left 2005  . CARDIOVERSION N/A 11/02/2016   Procedure: CARDIOVERSION;  Surgeon: Thayer Headings, MD;  Location: [place];  Service: Cardiovascular;  Laterality: N/A;  . CARDIOVERSION N/A 11/16/2016   Procedure: CARDIOVERSION;  Surgeon: Fay Records, MD;  Location: [place];  Service: Cardiovascular;  Laterality: N/A;  . CARDIOVERSION N/A 12/24/2016   Procedure: CARDIOVERSION;  Surgeon: Sanda Klein, MD;  [facility];  Service: Cardiovascular;  Laterality: N/A;  . CARPAL TUNNEL RELEASE Bilateral 1995  . COLONOSCOPY    . ELBOW FRACTURE SURGERY Right 2009   with implant  . ESOPHAGOGASTRODUODENOSCOPY    . ESOPHAGUS SURGERY     "in the process of getting cancerous cell off my esophagus" (10/31/2016)  . FRACTURE SURGERY    . LUMBAR DISC SURGERY     L5  . MOHS SURGERY     "corner of right eye; on left cheek"  . TEE WITHOUT CARDIOVERSION N/A 11/16/2016   Procedure: TRANSESOPHAGEAL ECHOCARDIOGRAM (TEE);  Surgeon: Fay Records, MD;  Location: [place];  Service: Cardiovascular;  Laterality: N/A;  . TONSILLECTOMY    . TUBAL LIGATION  1980     Current Outpatient Prescriptions  Medication Sig Dispense Refill  . amiodarone (PACERONE) 400 MG tablet Take 1 tablet (400 mg total) by mouth daily. 90 tablet 2  . cetirizine (ZYRTEC) 10 MG tablet Take 10 mg by mouth daily. Reported on 10/27/2015    . Cholecalciferol (D 1000) 1000  units capsule Take 1,000 Units by mouth daily. Reported on 11/03/2015    . ferrous sulfate (SM IRON) 325 (65 FE) MG tablet Take 325 mg by mouth daily.     Marland Kitchen FLUoxetine (PROZAC) 40 MG capsule Take 40 mg by mouth daily.     . furosemide (LASIX) 40 MG tablet Take 1 tablet (40 mg total) by mouth 2 (two) times daily. 60 tablet 3  . levothyroxine (SYNTHROID, LEVOTHROID) 175 MCG tablet Take 1 tablet (175 mcg total) by mouth daily before breakfast. 30 tablet 0  . metoprolol succinate (TOPROL-XL) 100 MG 24 hr tablet TAKE ONE TABLET BY MOUTH ONCE DAILY 90  tablet 3  . omeprazole (PRILOSEC) 40 MG capsule Take 40 mg by mouth 2 (two) times daily.    . potassium chloride SA (K-DUR,KLOR-CON) 20 MEQ tablet Take 1 tablet (20 mEq total) by mouth 2 (two) times daily. 60 tablet 3  . Rivaroxaban (XARELTO) 15 MG TABS tablet TAKE ONE TABLET BY MOUTH ONCE DAILY WITH SUPPER 90 tablet 3  . Thiamine HCl (VITAMIN B-1 PO) Take 1 tablet by mouth daily.    . traZODone (DESYREL) 150 MG tablet Take 75 mg by mouth at bedtime as needed for sleep.      No current facility-administered medications for this visit.     Allergies:   Diltiazem; Benazepril; and Diltiazem hcl   Social History:  The patient  reports that she has never smoked. She has never used smokeless tobacco. She reports that she does not drink alcohol or use drugs.   Family History:  The patient's  family history includes Heart attack (age of onset: 72) in her father.    ROS:  Please see the history of present illness.   All other systems are personally reviewed and negative.    PHYSICAL EXAM: VS:  BP 112/68 (BP Location: Right Arm, Cuff Size: Large)   Pulse (!) 49   Ht 5\' 2"  (1.575 m)   Wt 190 lb 12.8 oz (86.5 kg)   LMP  (LMP Unknown)   SpO2 98%   BMI 34.90 kg/m  , BMI Body mass index is 34.9 kg/m. GEN: Well nourished, well developed, in no acute distress  HEENT: normal  Neck: no JVD, carotid bruits, or masses Cardiac: RRR; no murmurs, rubs, or gallops,no edema  Respiratory:  clear to auscultation bilaterally, normal work of breathing GI: soft, nontender, nondistended, + BS MS: no deformity or atrophy  Skin: warm and dry  Neuro:  Strength and sensation are intact Psych: euthymic mood, full affect  EKG:  EKG is ordered today. The ekg tracing ordered today is personally reviewed and shows sinus bradycardia 49 bpm, PR 220 msec, LAHB, Qtc 485 msec   Recent Labs: 10/31/2016: ALT 15 11/12/2016: B Natriuretic Peptide 769.8; TSH 0.036 11/16/2016: Magnesium 1.4 12/17/2016: BUN 26; Creatinine,  Ser 1.04; Platelets 265 12/24/2016: Hemoglobin 15.0; Potassium 4.9; Sodium 139  personally reviewed   Lipid Panel     Component Value Date/Time   CHOL 118 11/16/2016 0900   TRIG 77 11/16/2016 0900   HDL 37 (L) 11/16/2016 0900   CHOLHDL 3.2 11/16/2016 0900   VLDL 15 11/16/2016 0900   LDLCALC 66 11/16/2016 0900   personally reviewed   Wt Readings from Last 3 Encounters:  01/30/17 190 lb 12.8 oz (86.5 kg)  12/31/16 183 lb 9.6 oz (83.3 kg)  12/17/16 184 lb 9.6 oz (83.7 kg)      Other studies personally reviewed: Additional studies/ records that were reviewed today include:  Dr Camillia Herter notes, prior TEE, 2D echo  Review of the above records today demonstrates: as above1   ASSESSMENT AND PLAN:  1.  Persistent atrial fibrillation Seems medicine refractory. She has failed amiodarone.  Currently however is in sinus rhythm on high dose amiodarone. I will reduce amiodarone to 200mg  daily today. Increase xarelto to 20mg  daily given recent CrCl of 89. Therapeutic strategies for afib including medicine and ablation were discussed in detail with the patient today. Risk, benefits, and alternatives to EP study and radiofrequency ablation for afib were also discussed in detail today. These risks include but are not limited to stroke, bleeding, vascular damage, tamponade, perforation, damage to the esophagus, lungs, and other structures, pulmonary vein stenosis, worsening renal function, and death. The patient understands these risk and wishes to proceed.  She is aware that given atrial enlargement and refractory afib that anticipated success with ablation is reduced.  She would like to proceed.We will therefore proceed with catheter ablation at the next available time.  She will require TEE prior to ablation.  Of note, she wishes to have EGD prior to ablation.  As she will require interuption of xarelto, we will await to her from her after this procedure to schedule her AF ablation.  2. Severe  MR Likely due to annular dilitation related to reduced EF and afib with RVR Will reassess prior to ablation with TEE.  Hopefully will improve with sinus rhythm.  If not improved, would consider surgical repair and MAZE.  3. Nonischemic CM Presumed tachycardia mediated.  Hopefully will improve with sinus rhythm post ablation.  4. Snoring I worry that OSA could be contributing to her afib.  She reports having a sleep study years ago but left mid study because she did not like the sleep lab.  I will refer her at her request to Dr Maxwell Caul for further evaluation of snoring.   Current medicines are reviewed at length with the patient today.   The patient does not have concerns regarding her medicines.  The following changes were made today:  none    Signed, Thompson Grayer, MD  01/30/2017 4:21 PM     [place] [address] [phone] (office) [phone] (fax)

## 2017-03-09 NOTE — Addendum Note (Signed)
Addendum  created 03/09/17 0928 by Duane Boston, MD   Sign clinical note

## 2017-05-10 ENCOUNTER — Other Ambulatory Visit: Payer: Self-pay | Admitting: *Deleted

## 2017-05-10 ENCOUNTER — Encounter: Payer: Self-pay | Admitting: *Deleted

## 2017-05-10 MED ORDER — FUROSEMIDE 40 MG PO TABS: 40.0000 mg | ORAL_TABLET | Freq: Two times a day (BID) | ORAL | 6 refills | Status: DC

## 2017-05-13 ENCOUNTER — Other Ambulatory Visit: Payer: Self-pay

## 2017-05-13 MED ORDER — FUROSEMIDE 40 MG PO TABS: 40.0000 mg | ORAL_TABLET | Freq: Two times a day (BID) | ORAL | 7 refills | Status: DC

## 2017-05-29 NOTE — Addendum Note (Signed)
Addendum  created 05/29/17 1233 by Albertha Ghee, MD   Sign clinical note

## 2017-06-28 ENCOUNTER — Other Ambulatory Visit (HOSPITAL_BASED_OUTPATIENT_CLINIC_OR_DEPARTMENT_OTHER): Payer: Self-pay

## 2017-06-28 DIAGNOSIS — G4733 Obstructive sleep apnea (adult) (pediatric): Secondary | ICD-10-CM

## 2017-06-28 DIAGNOSIS — I4891 Unspecified atrial fibrillation: Secondary | ICD-10-CM

## 2017-06-28 DIAGNOSIS — G4731 Primary central sleep apnea: Secondary | ICD-10-CM

## 2017-07-06 ENCOUNTER — Ambulatory Visit (HOSPITAL_BASED_OUTPATIENT_CLINIC_OR_DEPARTMENT_OTHER): Payer: Medicare HMO

## 2017-08-02 ENCOUNTER — Ambulatory Visit: Payer: Medicare HMO | Admitting: Internal Medicine

## 2017-08-25 ENCOUNTER — Ambulatory Visit (HOSPITAL_BASED_OUTPATIENT_CLINIC_OR_DEPARTMENT_OTHER): Payer: Medicare HMO | Attending: Internal Medicine | Admitting: Internal Medicine

## 2017-08-25 VITALS — Ht 62.0 in | Wt 197.0 lb

## 2017-08-25 DIAGNOSIS — G4733 Obstructive sleep apnea (adult) (pediatric): Secondary | ICD-10-CM | POA: Diagnosis not present

## 2017-08-25 DIAGNOSIS — G4731 Primary central sleep apnea: Secondary | ICD-10-CM | POA: Insufficient documentation

## 2017-08-28 NOTE — Procedures (Signed)
   NAME: Teresa York DATE OF BIRTH:  October 07, 1941 MEDICAL RECORD NUMBER [id number]  LOCATION: [facility]  PHYSICIAN: Marius Ditch  DATE OF STUDY: 08/25/2017  SLEEP STUDY TYPE: Positive Airway Pressure Titration               REFERRING PHYSICIAN: Marius Ditch, MD  INDICATION FOR STUDY: CSA and OSA in patient with low ejection fraction.   HEIGHT: 5\' 2"  (157.5 cm)  WEIGHT: 197 lb (89.4 kg)    Body mass index is 36.03 kg/m.  NECK SIZE: 16.5 in.  MEDICATIONS Patient self administered medications include: N/A. Medications administered during study include No sleep medicine administered.Marland Kitchen  SLEEP STUDY TECHNIQUE The patient underwent an attended overnight polysomnography titration to assess the effects of cpap therapy. The following variables were monitored: EEG (C4-A1, C3-A2, O1-A2, O2-A1, F3-M2, F4-M1), EOG, submental and leg EMG, ECG, oxyhemoglobin saturation by pulse oximetry, thoracic and abdominal respiratory effort belts, nasal/oral airflow by pressure sensor, body position sensor and snoring sensor. CPAP pressure was titrated to eliminate apneas, hypopneas and oxygen desaturation.  TECHNICAL COMMENTS Comments added by Technician: NO BATHROOM BREAKS TAKEN. PT FELL ASLEEP PRIOR TO START OF HOOKUP  Comments added by Scorer: N/A  SLEEP ARCHITECTURE The study was initiated at 10:58:05 PM and terminated at 5:01:36 AM. Total recorded time was 363.5 minutes. EEG confirmed total sleep time was 296.5 minutes yielding a sleep efficiency of 81.6%. Sleep onset after lights out was 0.5 minutes with a REM latency of 60.0 minutes. The patient spent 21.42% of the night in stage N1 sleep, 52.28% in stage N2 sleep, 0.00% in stage N3 and 26.31% in REM. The Arousal Index was 27.5/hour.  RESPIRATORY PARAMETERS The overall AHI was 38.2 per hour, with a central apnea index of 28.5 per hour. Optimal pressure was not achieved. The sleep efficiency was 81.6% and the patient was supine  for 69.98%. The arousal index was 27.5 per hour.The oxygen nadir was 77.00% during sleep. The patient was started on a CPAP of 4 and titrated to a CPAP of 14 due to continuing events. She was changed to BPAP and eventually pressures of 21/16 attained. Central apnea was never controlled. Obstructive apnea was well controlled on CPAP 13 and central events seemed lowest at that pressure as well.   LEG MOVEMENT DATA The total leg movements were with a resulting leg movement index of 16/hour. Associated arousal with leg movement index was 2.8.  CARDIAC DATA The underlying cardiac rhythm was most consistent with sinus rhythm. Mean heart rate during sleep was 83.92 bpm. Additional rhythm abnormalities include Atrial Fibrillation.  IMPRESSIONS - Severe Obstructive Sleep apnea(OSA)  - Moderate Central Sleep Apnea (CSA) - Inadequate PAP titration. Best pressure seemed to be CPAP 13.  DIAGNOSIS - Obstructive Sleep Apnea (327.23 [G47.33 ICD-10]) - Central Sleep Apnea   RECOMMENDATIONS - Trial of auto adjusting CPAP with Pmin 10 and Pmax of 16.  - May use ramp to initiate therapy.    Marius Ditch Sleep specialist American Board of Sleep Medicine  ELECTRONICALLY SIGNED ON:  08/28/2017, 8:57 PM [place] PH: [phone]   FX: [phone] [place]

## 2017-09-19 ENCOUNTER — Encounter (INDEPENDENT_AMBULATORY_CARE_PROVIDER_SITE_OTHER): Payer: Self-pay

## 2017-09-19 ENCOUNTER — Ambulatory Visit: Payer: Medicare HMO | Admitting: Cardiovascular Disease

## 2017-09-19 ENCOUNTER — Encounter: Payer: Self-pay | Admitting: Cardiovascular Disease

## 2017-09-19 VITALS — BP 126/82 | HR 84 | Ht 62.0 in | Wt 209.0 lb

## 2017-09-19 DIAGNOSIS — I351 Nonrheumatic aortic (valve) insufficiency: Secondary | ICD-10-CM

## 2017-09-19 DIAGNOSIS — I340 Nonrheumatic mitral (valve) insufficiency: Secondary | ICD-10-CM

## 2017-09-19 DIAGNOSIS — I481 Persistent atrial fibrillation: Secondary | ICD-10-CM

## 2017-09-19 DIAGNOSIS — I428 Other cardiomyopathies: Secondary | ICD-10-CM

## 2017-09-19 DIAGNOSIS — I5022 Chronic systolic (congestive) heart failure: Secondary | ICD-10-CM

## 2017-09-19 DIAGNOSIS — I4819 Other persistent atrial fibrillation: Secondary | ICD-10-CM

## 2017-09-19 NOTE — Patient Instructions (Signed)
Medication Instructions:  Your physician recommends that you continue on your current medications as directed. Please refer to the Current Medication list given to you today.   Labwork: none  Testing/Procedures: none  Follow-Up: Your physician recommends that you schedule a follow-up appointment in: early February with Dr. Rayann Heman.   Your physician recommends that you schedule a follow-up appointment in: 6 months with Dr. [name]. Please call our office in about 3 months to schedule this appointment    Any Other Special Instructions Will Be Listed Below (If Applicable).     If you need a refill on your cardiac medications before your next appointment, please call your pharmacy.

## 2017-09-19 NOTE — Progress Notes (Signed)
Chief Complaint  Patient presents with  . Follow-up    Paroxysmal atrial fibrillation     History of Present Illness: 76 yo female with history of HTN, HLD, OSA, GERD, Barrett's esophagitis and paroxysmal atrial fibrillation who is here today for cardiac follow up. I saw her as a new patient in 2015 for evaluation and further management of her atrial fibrillation. She was seen in her primary care office 10/27/13 and routine EKG demonstrated atrial fibrillation with HR of 141 bpm. She had no awareness of irregularity of her heart and no other symptoms. I started Xarelto and Toprol. Echo in 2015 with normal LV function, mild to moderate AI, LAE. She was seen in our office January 2018 and was in atrial fib with RVR and she was volume overloaded. She was admitted to [facility] and diuresed. Echo January 2018 with LVEF=30-35%. She was cardioverted and sent home without Lasix. She was readmitted to [facility] 11/12/16 with volume overload and was again in atrial fib with RVR. She was loaded with IV amiodarone, diuresed and cardioverted but converted back to atrial fib before discharge. She was discharged on po amiodarone and Toprol. She was seen here in our office by Estella Husk, PA-C on 12/17/16 and was doing well but still in atrial fib. Repeat DCCV on 12/24/16 and she was converted to sinus but when I saw her in March 2018 she was back in atrial fib. She has been seen in the EP clinic April 2018 by Dr. Rayann Heman and has remained on low dose amiodarone with discussion regarding ablation. She was going to have a sleep study and an EGD prior to TEE and ablation. She is planning to have an EGD at [facility] in January 2019. Sleep study with OSA. She is picking up her CPAP tomorrow.   She is here today for follow up. The patient denies any chest pain, dyspnea, palpitations, lower extremity edema, orthopnea, PND, dizziness, near syncope or syncope.     Primary Care Physician: Maylon Peppers, MD  Past Medical History:    Diagnosis Date  . Allergic rhinitis   . Anxiety   . Atrial fibrillation ([place])   . Barrett's esophagus   . Breast cancer ([place]) 2005   "left"  . Chronic lower back pain   . Chronic systolic CHF (congestive heart failure) (HCC)    a. EF 30-35% felt to be possibly be tachy mediated from afib wtih RVR  . Depression   . Diverticulosis   . GERD (gastroesophageal reflux disease)   . Hiatal hernia   . Hypertension   . Hypothyroidism   . Left atrial enlargement   . Mitral regurgitation   . OAB (overactive bladder)   . Persistent atrial fibrillation ([place]) 10/31/2016   a. s/p failed DCCV x3. Now on amiodarone.   . 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)    Past Surgical History:  Procedure Laterality Date  . BACK SURGERY    . BREAST BIOPSY Left 2005  . BREAST LUMPECTOMY Left 2005  . CARDIOVERSION N/A 11/02/2016   Procedure: CARDIOVERSION;  Surgeon: Thayer Headings, MD;  Location: [facility];  Service: Cardiovascular;  Laterality: N/A;  . CARDIOVERSION N/A 11/16/2016   Procedure: CARDIOVERSION;  Surgeon: Fay Records, MD;  Location: [place] [facility];  Service: Cardiovascular;  Laterality: N/A;  . CARDIOVERSION N/A 12/24/2016   Procedure: CARDIOVERSION;  Surgeon: Sanda Klein, MD;  [facility]: [facility];  Service: Cardiovascular;  Laterality: N/A;  . CARPAL TUNNEL RELEASE  Bilateral 1995  . COLONOSCOPY    . ELBOW FRACTURE SURGERY Right 2009   with implant  . ESOPHAGOGASTRODUODENOSCOPY    . ESOPHAGUS SURGERY     "in the process of getting cancerous cell off my esophagus" (10/31/2016)  . FRACTURE SURGERY    . LUMBAR DISC SURGERY     L5  . MOHS SURGERY     "corner of right eye; on left cheek"  . TEE WITHOUT CARDIOVERSION N/A 11/16/2016   Procedure: TRANSESOPHAGEAL ECHOCARDIOGRAM (TEE);  Surgeon: Fay Records, MD;  Location: [place];  Service: Cardiovascular;  Laterality: N/A;  . TONSILLECTOMY    . TUBAL LIGATION  1980    Current Outpatient Medications   Medication Sig Dispense Refill  . amiodarone (PACERONE) 400 MG tablet Take 0.5 tablets (200 mg total) by mouth daily. 90 tablet 2  . cetirizine (ZYRTEC) 10 MG tablet Take 10 mg by mouth daily. Reported on 10/27/2015    . Cholecalciferol (D 1000) 1000 units capsule Take 1,000 Units by mouth daily. Reported on 11/03/2015    . ferrous sulfate (SM IRON) 325 (65 FE) MG tablet Take 325 mg by mouth daily.     Marland Kitchen FLUoxetine (PROZAC) 40 MG capsule Take 40 mg by mouth daily.     . furosemide (LASIX) 40 MG tablet Take 1 tablet (40 mg total) by mouth 2 (two) times daily. 60 tablet 7  . levothyroxine (SYNTHROID, LEVOTHROID) 175 MCG tablet Take 1 tablet (175 mcg total) by mouth daily before breakfast. 30 tablet 0  . metoprolol succinate (TOPROL-XL) 100 MG 24 hr tablet TAKE ONE TABLET BY MOUTH ONCE DAILY 90 tablet 3  . omeprazole (PRILOSEC) 40 MG capsule Take 40 mg by mouth 2 (two) times daily.    . potassium chloride SA (K-DUR,KLOR-CON) 20 MEQ tablet Take 1 tablet (20 mEq total) by mouth 2 (two) times daily. 60 tablet 3  . Rivaroxaban (XARELTO) 20 MG TABS tablet Take 1 tablet (20 mg total) by mouth daily with supper. TAKE ONE TABLET BY MOUTH ONCE DAILY WITH SUPPER 30 tablet 11  . Thiamine HCl (VITAMIN B-1 PO) Take 1 tablet by mouth daily.    . traZODone (DESYREL) 150 MG tablet Take 75 mg by mouth at bedtime as needed for sleep.      No current facility-administered medications for this visit.     Allergies  Allergen Reactions  . Diltiazem     rash  . Benazepril Cough  . Diltiazem Hcl Rash    Social History   Socioeconomic History  . Marital status: Married    Spouse name: Not on file  . Number of children: 2  . Years of education: Not on file  . Highest education level: Not on file  Social Needs  . Financial resource strain: Not on file  . Food insecurity - worry: Not on file  . Food insecurity - inability: Not on file  . Transportation needs - medical: Not on file  . Transportation needs -  non-medical: Not on file  Occupational History  . Occupation: Retired    Fish farm manager: RETIRED  Tobacco Use  . Smoking status: Never Smoker  . Smokeless tobacco: Never Used  Substance and Sexual Activity  . Alcohol use: Yes    Alcohol/week: 0.0 oz    Comment: 1-2 per week  . Drug use: No  . Sexual activity: Not Currently  Other Topics Concern  . Not on file  Social History Narrative  . Not on file    Family History  Problem Relation Age of Onset  . Heart attack Father 95  . Colon cancer Neg Hx   . Esophageal cancer Neg Hx   . Stomach cancer Neg Hx     Review of Systems:  As stated in the HPI and otherwise negative.   BP 126/82   Pulse 84   Ht 5\' 2"  (1.575 m)   Wt 209 lb (94.8 kg)   LMP  (LMP Unknown)   SpO2 95%   BMI 38.23 kg/m   Physical Examination:  General: Well developed, well nourished, NAD  HEENT: OP clear, mucus membranes moist  SKIN: warm, dry. No rashes. Neuro: No focal deficits  Musculoskeletal: Muscle strength 5/5 all ext  Psychiatric: Mood and affect normal  Neck: No JVD, no carotid bruits, no thyromegaly, no lymphadenopathy.  Lungs:Clear bilaterally, no wheezes, rhonci, crackles Cardiovascular: Irreg irreg. No murmurs, gallops or rubs. Abdomen:Soft. Bowel sounds present. Non-tender.  Extremities: No lower extremity edema. Pulses are 2 + in the bilateral DP/PT.  EKG:  EKG is ordered today. The ekg ordered today demonstrates atrial fib, rate 84 bpm. T wave flattening.   Recent Labs: 10/31/2016: ALT 15 11/12/2016: B Natriuretic Peptide 769.8; TSH 0.036 11/16/2016: Magnesium 1.4 12/17/2016: BUN 26; Creatinine, Ser 1.04; Platelets 265 12/24/2016: Hemoglobin 15.0; Potassium 4.9; Sodium 139   Lipid Panel    Component Value Date/Time   CHOL 118 11/16/2016 0900   TRIG 77 11/16/2016 0900   HDL 37 (L) 11/16/2016 0900   CHOLHDL 3.2 11/16/2016 0900   VLDL 15 11/16/2016 0900   LDLCALC 66 11/16/2016 0900     Wt Readings from Last 3 Encounters:  09/19/17  209 lb (94.8 kg)  08/25/17 197 lb (89.4 kg)  01/30/17 190 lb 12.8 oz (86.5 kg)     Other studies Reviewed: Additional studies/ records that were reviewed today include: . Review of the above records demonstrates:    Assessment and Plan:   1. Atrial fibrillation, persistent: She is in atrial fib today and rate controlled. She is on Toprol and amiodarone. She is being followed now in our atrial fib clinic by Dr. Rayann Heman. For now, will continue Xarelto, Toprol and amiodarone.  I will arrange follow up for her to discuss ablation with Dr. Rayann Heman.   2. Mitral valve insufficiency: Moderate by echo February 2018. She will need a repeat TEE prior to ablation and if her MR does not improve, will need consideration for surgical repair/replacement. I will not repeat a TTE right now since she will require a TEE anyway.   3. Non-ischemic cardiomyopathy: LVEF=35% by echo February 2018. Hopefully this will improve with maintenance of sinus rhythm. She will likely need a TEE prior to ablation.       4. Chronic systolic CHF: Weight is up today but she does not think she is retaining fluid. She blames this on her diet. No LE edema. Continue Lasix 40 mg po BID.    5. Aortic valve insufficiency: Moderate by echo January 2018.   Current medicines are reviewed at length with the patient today.  The patient does not have concerns regarding medicines.  The following changes have been made:  no change  Labs/ tests ordered today include:   Orders Placed This Encounter  Procedures  . EKG 12-Lead     Disposition:   FU with me in 6  months   Signed, Lauree Chandler, MD 09/19/2017 12:39 PM    [address], [place], [place]  [address] Phone: [phone])  [phone]; Fax: [phone]

## 2017-11-03 ENCOUNTER — Encounter (HOSPITAL_COMMUNITY): Payer: Self-pay | Admitting: *Deleted

## 2017-11-03 ENCOUNTER — Other Ambulatory Visit: Payer: Self-pay

## 2017-11-03 ENCOUNTER — Emergency Department (HOSPITAL_COMMUNITY)
Admission: EM | Admit: 2017-11-03 | Discharge: 2017-11-03 | Disposition: A | Discharge status: Other Acute Inpatient Hospital | Payer: Medicare HMO | Attending: Emergency Medicine | Admitting: Emergency Medicine

## 2017-11-03 ENCOUNTER — Emergency Department (HOSPITAL_COMMUNITY): Payer: Medicare HMO

## 2017-11-03 DIAGNOSIS — Z7901 Long term (current) use of anticoagulants: Secondary | ICD-10-CM | POA: Diagnosis not present

## 2017-11-03 DIAGNOSIS — R5383 Other fatigue: Secondary | ICD-10-CM | POA: Diagnosis not present

## 2017-11-03 DIAGNOSIS — R011 Cardiac murmur, unspecified: Secondary | ICD-10-CM | POA: Insufficient documentation

## 2017-11-03 DIAGNOSIS — K920 Hematemesis: Secondary | ICD-10-CM | POA: Insufficient documentation

## 2017-11-03 DIAGNOSIS — E039 Hypothyroidism, unspecified: Secondary | ICD-10-CM | POA: Insufficient documentation

## 2017-11-03 DIAGNOSIS — Z853 Personal history of malignant neoplasm of breast: Secondary | ICD-10-CM | POA: Insufficient documentation

## 2017-11-03 DIAGNOSIS — Z85828 Personal history of other malignant neoplasm of skin: Secondary | ICD-10-CM | POA: Insufficient documentation

## 2017-11-03 DIAGNOSIS — Z79899 Other long term (current) drug therapy: Secondary | ICD-10-CM | POA: Insufficient documentation

## 2017-11-03 DIAGNOSIS — I110 Hypertensive heart disease with heart failure: Secondary | ICD-10-CM | POA: Insufficient documentation

## 2017-11-03 DIAGNOSIS — R609 Edema, unspecified: Secondary | ICD-10-CM | POA: Diagnosis not present

## 2017-11-03 DIAGNOSIS — Z8679 Personal history of other diseases of the circulatory system: Secondary | ICD-10-CM | POA: Insufficient documentation

## 2017-11-03 DIAGNOSIS — I5022 Chronic systolic (congestive) heart failure: Secondary | ICD-10-CM | POA: Diagnosis not present

## 2017-11-03 LAB — CBC
HEMATOCRIT: 33 % — AB (ref 36.0–46.0)
Hemoglobin: 10 g/dL — ABNORMAL LOW (ref 12.0–15.0)
MCH: 27.8 pg (ref 26.0–34.0)
MCHC: 30.3 g/dL (ref 30.0–36.0)
MCV: 91.7 fL (ref 78.0–100.0)
PLATELETS: 367 10*3/uL (ref 150–400)
RBC: 3.6 MIL/uL — ABNORMAL LOW (ref 3.87–5.11)
RDW: 17.9 % — AB (ref 11.5–15.5)
WBC: 10.2 10*3/uL (ref 4.0–10.5)

## 2017-11-03 LAB — BRAIN NATRIURETIC PEPTIDE: B NATRIURETIC PEPTIDE 5: 913.1 pg/mL — AB (ref 0.0–100.0)

## 2017-11-03 LAB — PROTIME-INR
INR: >10
Prothrombin Time: >90 seconds — ABNORMAL HIGH (ref 11.4–15.2)

## 2017-11-03 LAB — COMPREHENSIVE METABOLIC PANEL
ALT: 26 U/L (ref 14–54)
AST: 30 U/L (ref 15–41)
Albumin: 2.5 g/dL — ABNORMAL LOW (ref 3.5–5.0)
Alkaline Phosphatase: 69 U/L (ref 38–126)
Anion gap: 12 (ref 5–15)
BUN: 36 mg/dL — AB (ref 6–20)
CHLORIDE: 104 mmol/L (ref 101–111)
CO2: 23 mmol/L (ref 22–32)
Calcium: 8.3 mg/dL — ABNORMAL LOW (ref 8.9–10.3)
Creatinine, Ser: 1.44 mg/dL — ABNORMAL HIGH (ref 0.44–1.00)
GFR calc Af Amer: 39 mL/min — ABNORMAL LOW (ref >60)
GFR, EST NON AFRICAN AMERICAN: 34 mL/min — AB (ref >60)
GLUCOSE: 105 mg/dL — AB (ref 65–99)
POTASSIUM: 3.7 mmol/L (ref 3.5–5.1)
Sodium: 139 mmol/L (ref 135–145)
Total Bilirubin: 0.7 mg/dL (ref 0.3–1.2)
Total Protein: 5.8 g/dL — ABNORMAL LOW (ref 6.5–8.1)

## 2017-11-03 LAB — TYPE AND SCREEN
ABO/RH(D): AB POS
Antibody Screen: NEGATIVE

## 2017-11-03 LAB — ABO/RH: ABO/RH(D): AB POS

## 2017-11-03 MED ORDER — CETIRIZINE HCL 10 MG PO TABS
10.00 mg | ORAL_TABLET | ORAL | Status: DC
Start: 2017-11-05 — End: 2017-11-03

## 2017-11-03 MED ORDER — TRIMETHOPRIM 100 MG PO TABS
100.00 mg | ORAL_TABLET | ORAL | Status: DC
Start: 2017-11-04 — End: 2017-11-03

## 2017-11-03 MED ORDER — ONDANSETRON HCL 4 MG/2ML IJ SOLN
INTRAMUSCULAR | Status: AC
  Filled 2017-11-03: qty 2

## 2017-11-03 MED ORDER — VITAMIN D3 25 MCG (1000 UT) PO CAPS
1000.00 | ORAL_CAPSULE | ORAL | Status: DC
Start: 2017-11-05 — End: 2017-11-03

## 2017-11-03 MED ORDER — ONDANSETRON HCL 4 MG/2ML IJ SOLN
4.00 mg | INTRAMUSCULAR | Status: DC
Start: ? — End: 2017-11-03

## 2017-11-03 MED ORDER — ONDANSETRON HCL 4 MG/2ML IJ SOLN: 4.0000 mg | Freq: Once | INTRAMUSCULAR | Status: DC

## 2017-11-03 MED ORDER — PANTOPRAZOLE SODIUM 40 MG IV SOLR
40.00 mg | INTRAVENOUS | Status: DC
Start: 2017-11-04 — End: 2017-11-03

## 2017-11-03 MED ORDER — HYDROCODONE-ACETAMINOPHEN 5-325 MG PO TABS
1.00 | ORAL_TABLET | ORAL | Status: DC
Start: ? — End: 2017-11-03

## 2017-11-03 MED ORDER — FLUOXETINE HCL 20 MG PO CAPS
40.00 mg | ORAL_CAPSULE | ORAL | Status: DC
Start: 2017-11-05 — End: 2017-11-03

## 2017-11-03 MED ORDER — SODIUM CHLORIDE 0.9 % IV BOLUS (SEPSIS)
500.0000 mL | Freq: Once | INTRAVENOUS | Status: AC
  Administered 2017-11-03: 500 mL via INTRAVENOUS

## 2017-11-03 MED ORDER — TRAZODONE HCL 100 MG PO TABS
50.00 mg | ORAL_TABLET | ORAL | Status: DC
Start: ? — End: 2017-11-03

## 2017-11-03 NOTE — ED Triage Notes (Signed)
Pt reports sudden onset of vomiting bright red blood today. Reports vomiting approx 5 times pta. Denies dark stools. Is on blood thinners. Appears pale at triage.

## 2017-11-03 NOTE — ED Provider Notes (Signed)
[facility] EMERGENCY DEPARTMENT Provider Note   CSN: [id number] Arrival date & time: 11/03/17  1411     History   Chief Complaint Chief Complaint  Patient presents with  . Hematemesis    HPI Teresa York is a 77 y.o. female.  The history is provided by the patient, the spouse, a relative and medical records.  Emesis   This is a new problem. The current episode started 12 to 24 hours ago. The problem occurs 5 to 10 times per day. The problem has not changed since onset.The emesis has an appearance of bright red blood. There has been no fever. Associated symptoms include abdominal pain. Pertinent negatives include no arthralgias, no chills, no cough, no diarrhea, no fever, no headaches, no myalgias, no sweats and no URI.    Past Medical History:  Diagnosis Date  . Allergic rhinitis   . Anxiety   . Atrial fibrillation ([place])   . Barrett's esophagus   . Breast cancer ([place]) 2005   "left"  . Chronic lower back pain   . Chronic systolic CHF (congestive heart failure) (HCC)    a. EF 30-35% felt to be possibly be tachy mediated from afib wtih RVR  . Depression   . Diverticulosis   . GERD (gastroesophageal reflux disease)   . Hiatal hernia   . Hypertension   . Hypothyroidism   . Left atrial enlargement   . Mitral regurgitation   . OAB (overactive bladder)   . Persistent atrial fibrillation ([place]) 10/31/2016   a. s/p failed DCCV x3. Now on amiodarone.   . 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)    Patient Active Problem List   Diagnosis Date Noted  . Persistent atrial fibrillation ([place])   . Chronic systolic CHF (congestive heart failure) ([place])   . Hypertension   . Hypothyroidism   . Acute on chronic combined systolic and diastolic CHF (congestive heart failure) ([place]) 11/12/2016  . Aortic valve regurgitation 10/31/2016  . Atrial fibrillation with RVR ([place]) 10/31/2016  . Barrett's esophagus without dysplasia 10/27/2015  . Dysphagia  10/27/2015  . Loss of weight 10/27/2015  . Chronic anticoagulation 10/27/2015    Past Surgical History:  Procedure Laterality Date  . BACK SURGERY    . BREAST BIOPSY Left 2005  . BREAST LUMPECTOMY Left 2005  . CARDIOVERSION N/A 11/02/2016   Procedure: CARDIOVERSION;  Surgeon: Thayer Headings, MD;  Location: [facility];  Service: Cardiovascular;  Laterality: N/A;  . CARDIOVERSION N/A 11/16/2016   Procedure: CARDIOVERSION;  Surgeon: Fay Records, MD;  Location: [facility];  Service: Cardiovascular;  Laterality: N/A;  . CARDIOVERSION N/A 12/24/2016   Procedure: CARDIOVERSION;  Surgeon: Sanda Klein, MD;  [facility];  Service: Cardiovascular;  Laterality: N/A;  . CARPAL TUNNEL RELEASE Bilateral 1995  . COLONOSCOPY    . ELBOW FRACTURE SURGERY Right 2009   with implant  . ESOPHAGOGASTRODUODENOSCOPY    . ESOPHAGUS SURGERY     "in the process of getting cancerous cell off my esophagus" (10/31/2016)  . FRACTURE SURGERY    . LUMBAR DISC SURGERY     L5  . MOHS SURGERY     "corner of right eye; on left cheek"  . TEE WITHOUT CARDIOVERSION N/A 11/16/2016   Procedure: TRANSESOPHAGEAL ECHOCARDIOGRAM (TEE);  Surgeon: Fay Records, MD;  Location: [facility];  Service: Cardiovascular;  Laterality: N/A;  . TONSILLECTOMY    . TUBAL LIGATION  1980    OB History  No data available       Home Medications    Prior to Admission medications   Medication Sig Start Date End Date Taking? Authorizing Provider  amiodarone (PACERONE) 400 MG tablet Take 0.5 tablets (200 mg total) by mouth daily. 01/30/17   Allred, Jeneen Rinks, MD  cetirizine (ZYRTEC) 10 MG tablet Take 10 mg by mouth daily. Reported on 10/27/2015    [provider]  Cholecalciferol (D 1000) 1000 units capsule Take 1,000 Units by mouth daily. Reported on 11/03/2015 11/30/14   [provider]  ferrous sulfate (SM IRON) 325 (65 FE) MG tablet Take 325 mg by mouth daily.  11/30/14   [provider]  FLUoxetine  (PROZAC) 40 MG capsule Take 40 mg by mouth daily.  08/24/11   [provider]  furosemide (LASIX) 40 MG tablet Take 1 tablet (40 mg total) by mouth 2 (two) times daily. 05/13/17   Burnell Blanks, MD  levothyroxine (SYNTHROID, LEVOTHROID) 175 MCG tablet Take 1 tablet (175 mcg total) by mouth daily before breakfast. 11/20/16   Delos Haring, PA-C  metoprolol succinate (TOPROL-XL) 100 MG 24 hr tablet TAKE ONE TABLET BY MOUTH ONCE DAILY 12/17/16   Imogene Burn, PA-C  omeprazole (PRILOSEC) 40 MG capsule Take 40 mg by mouth 2 (two) times daily.    [provider]  potassium chloride SA (K-DUR,KLOR-CON) 20 MEQ tablet Take 1 tablet (20 mEq total) by mouth 2 (two) times daily. 11/20/16   Delos Haring, PA-C  Rivaroxaban (XARELTO) 20 MG TABS tablet Take 1 tablet (20 mg total) by mouth daily with supper. TAKE ONE TABLET BY MOUTH ONCE DAILY WITH SUPPER 01/30/17   Allred, Jeneen Rinks, MD  Thiamine HCl (VITAMIN B-1 PO) Take 1 tablet by mouth daily.    [provider]  traZODone (DESYREL) 150 MG tablet Take 75 mg by mouth at bedtime as needed for sleep.  11/06/13   [provider]    Family History Family History  Problem Relation Age of Onset  . Heart attack Father 95  . Colon cancer Neg Hx   . Esophageal cancer Neg Hx   . Stomach cancer Neg Hx     Social History Social History   Tobacco Use  . Smoking status: Never Smoker  . Smokeless tobacco: Never Used  Substance Use Topics  . Alcohol use: Yes    Alcohol/week: 0.0 oz    Comment: 1-2 per week  . Drug use: No     Allergies   Diltiazem; Benazepril; and Diltiazem hcl   Review of Systems Review of Systems  Constitutional: Positive for fatigue. Negative for chills, diaphoresis and fever.  HENT: Negative for congestion, rhinorrhea, sinus pain, sore throat, trouble swallowing and voice change.   Respiratory: Negative for cough and chest tightness.   Gastrointestinal: Positive for abdominal pain, nausea  and vomiting. Negative for abdominal distention, constipation and diarrhea.  Genitourinary: Negative for dysuria.  Musculoskeletal: Negative for arthralgias, back pain, myalgias, neck pain and neck stiffness.  Skin: Negative for rash and wound.  Neurological: Negative for syncope and headaches.  Psychiatric/Behavioral: Negative for agitation and confusion.  All other systems reviewed and are negative.    Physical Exam Updated Vital Signs BP (!) 120/45 (BP Location: Right Arm)   Pulse (!) 53   Temp 97.7 F (36.5 C) (Oral)   Resp 16   Ht 5\' 2"  (1.575 m)   Wt 91 kg (200 lb 11.2 oz)   LMP  (LMP Unknown)   SpO2 96%  BMI 36.71 kg/m   Physical Exam  Constitutional: She is oriented to person, place, and time. She appears well-developed and well-nourished. No distress.  HENT:  Head: Normocephalic and atraumatic.  Mouth/Throat: Oropharynx is clear and moist. No oropharyngeal exudate.  Eyes: EOM are normal. Pupils are equal, round, and reactive to light.  Neck: Normal range of motion. Neck supple.  Cardiovascular: Normal rate and intact distal pulses.  Murmur heard. Pulmonary/Chest: Effort normal and breath sounds normal. No stridor. No respiratory distress. She has no wheezes. She has no rales. She exhibits no tenderness.  Abdominal: Soft. She exhibits no distension. There is no tenderness. There is no rebound.  Musculoskeletal: She exhibits edema (mild in both legs). She exhibits no tenderness.  Neurological: She is alert and oriented to person, place, and time. No cranial nerve deficit or sensory deficit. She exhibits normal muscle tone.  Skin: Capillary refill takes less than 2 seconds. No rash noted. She is not diaphoretic. No erythema.  Nursing note and vitals reviewed.    ED Treatments / Results  Labs (all labs ordered are listed, but only abnormal results are displayed) Labs Reviewed  COMPREHENSIVE METABOLIC PANEL - Abnormal; Notable for the following components:       Result Value   Glucose, Bld 105 (*)    BUN 36 (*)    Creatinine, Ser 1.44 (*)    Calcium 8.3 (*)    Total Protein 5.8 (*)    Albumin 2.5 (*)    GFR calc non Af Amer 34 (*)    GFR calc Af Amer 39 (*)    All other components within normal limits  CBC - Abnormal; Notable for the following components:   RBC 3.60 (*)    Hemoglobin 10.0 (*)    HCT 33.0 (*)    RDW 17.9 (*)    All other components within normal limits  PROTIME-INR - Abnormal; Notable for the following components:   Prothrombin Time >90.0 (*)    INR >10.00 (*)    All other components within normal limits  BRAIN NATRIURETIC PEPTIDE - Abnormal; Notable for the following components:   B Natriuretic Peptide 913.1 (*)    All other components within normal limits  TYPE AND SCREEN  ABO/RH    EKG  EKG Interpretation  Date/Time:  Sunday November 03 2017 17:09:57 EST Ventricular Rate:  48 PR Interval:    QRS Duration: 111 QT Interval:  634 QTC Calculation: 567 R Axis:   1 Text Interpretation:  Sinus bradycardia Repol abnrm suggests ischemia, lateral leads Prolonged QT interval when compared to prior, longer QT.  No STEMI Confirmed by Antony Blackbird [phone]) on 11/03/2017 5:27:45 PM       Radiology Dg Chest 2 View  Result Date: 11/03/2017 CLINICAL DATA:  Patient with history of vomiting. EXAM: CHEST  2 VIEW COMPARISON:  Chest radiograph 10/29/2016 FINDINGS: Monitoring leads overlie the patient. Stable cardiomegaly. Bilateral interstitial pulmonary opacities. No pleural effusion or pneumothorax. Thoracic spine degenerative changes. IMPRESSION: Cardiomegaly, pulmonary vascular redistribution and mild interstitial edema. Electronically Signed   By: Lovey Newcomer M.D.   On: 11/03/2017 17:44    Procedures Procedures (including critical care time)  Medications Ordered in ED Medications  sodium chloride 0.9 % bolus 500 mL (0 mLs Intravenous Stopped 11/03/17 1730)  sodium chloride 0.9 % bolus 500 mL (500 mLs Intravenous  Transfusing/Transfer 11/03/17 1949)     Initial Impression / Assessment and Plan / ED Course  I have reviewed the triage vital signs and the  nursing notes.  Pertinent labs & imaging results that were available during my care of the patient were reviewed by me and considered in my medical decision making (see chart for details).     Teresa York is a 77 y.o. female with a past medical history significant for chronic atrial fibrillation on Xarelto, CHF on Lasix, hypertension, hypothyroidism, and Barrett's esophagus status post ablation therapy 2 weeks ago who presents with hematemesis, lightheadedness, and fatigue.  Patient is coming by family reports that 2 weeks ago, patient had a procedure done at [facility] by a Dr. Adria Devon which was her fourth Barrett's esophagus ablation.  Patient also has a hiatal hernia.  According to family, patient held her Xarelto for 1 week after the procedure but then started it this week.  She has been having some continuous discomfort in her lower neck and chest since the procedure that has not worsened.  She reports that she has been feeling somewhat fatigued and tired but today had 5 episodes of bright blood hematemesis.  She reports no history of this in the past.  She denies any changes in her bowel movements specifically, no black or dark tarry stools.  She denies any fevers, chills, shortness of breath, urinary changes, or back pain.  She does report some abdominal aching that is been constant since the procedure.  She describes her discomfort as a 5 out of 10 at its worst and she is currently pain-free.  She denies any syncopal episodes.  She has not had much to drink today due to the emesis.  On exam, no crepitance was palpated.  Patient can range her neck in all directions without significant discomfort.  Oropharyngeal exam unremarkable.  Family does feel the patient looks more pale than normal.  Chest and back were nontender.  Lungs were clear.  Abdomen  nontender on my exam.  Patient had pulses in all extremities.  Minimal edema in the lower extremities which they report is unchanged from prior.  As patient is recently postoperative and on Xarelto, I am concerned there may be a bleed of the surgical site.  Patient will have laboratory testing performed and patient will be given a small amount of fluids due to the lightheadedness and concern for dehydration.  Anticipate speaking with the gastroneurology team at [facility] to determine disposition will obtain.  Chest x-ray and EKG given the chest discomfort.   Hemoglobin returned at 10.0.  This does appear to be decreased from prior.  Creatinine is up from prior.  INR was elevated however, patient is anticoagulated on Xarelto.  Chest x-ray showed cardiomegaly and pulmonary vascular edema but no evidence of pneumothorax or subcutaneous gas.  EKG showed sinus bradycardia.   7:26 PM [facility] was called.  I spoke with Dr.Sheikh with gastroenterology who agreed with transfer for their evaluation of the possible postprocedural bleed on blood thinners.  However, due to concern for bed delay, they requested patient be transferred ED to ED so they can evaluate the patient tonight and determine if she needed emergent versus endoscopy tomorrow.  Dr. Hardin Negus with the emergency team at [facility] accepted the patient in transfer.  Patient will be transferred for evaluation by [facility] team.  7:50 PM Just prior to transfer, patient went to the bathroom.  Patient had a bowel movement that was dark and concerning for bleeding.  This fits with the patient's concern for upper GI bleed.  Patient also had increase in her heart rate into the 120s and was diaphoretic and  nauseous.  Patient was given Zofran and started on some more fluids.  Patient began feeling much better and her heart rate went back into the 70s.  Patient is in A. fib currently.  Patient will be transported for further management at [facility] of her GI bleed.   Final Clinical  Impressions(s) / ED Diagnoses   Final diagnoses:  Hematemesis with nausea     Clinical Impression: 1. Hematemesis with nausea     Disposition: Transferred to [facility] for further management by patient's previous GI surgical team     Kymberlie Brazeau, Gwenyth Allegra, MD 11/03/17 2340

## 2017-11-03 NOTE — ED Notes (Signed)
Pt to bathroom to have BM with asst.  Pt had large coffee ground BM.  When returning to pt's room pt became very pale, diaphoretic and started to dry heave.  Dr. Sherry Ruffing made aware and at bedside.   Pt placed back on stretcher, Zofran given

## 2017-11-03 NOTE — ED Notes (Signed)
inr greater than 10 Pt greater than 90 Dr. Gustavus Messing notified, no further orders, will continue to monitor

## 2017-11-03 NOTE — ED Notes (Signed)
Patient transported to radiology

## 2017-11-04 ENCOUNTER — Telehealth: Payer: Self-pay | Admitting: Cardiovascular Disease

## 2017-11-04 MED ORDER — GENERIC EXTERNAL MEDICATION
225.00 | Status: DC
Start: 2017-11-05 — End: 2017-11-04

## 2017-11-12 ENCOUNTER — Encounter: Payer: Self-pay | Admitting: Internal Medicine

## 2017-11-13 ENCOUNTER — Encounter (HOSPITAL_COMMUNITY)
Admission: EM | Disposition: A | Discharge status: Home Health | Payer: Self-pay | Source: Home / Self Care | Attending: Neurology

## 2017-11-13 ENCOUNTER — Inpatient Hospital Stay (HOSPITAL_COMMUNITY): Payer: Medicare HMO | Admitting: Anesthesiology

## 2017-11-13 ENCOUNTER — Inpatient Hospital Stay (HOSPITAL_COMMUNITY): Payer: Medicare HMO

## 2017-11-13 ENCOUNTER — Emergency Department (HOSPITAL_COMMUNITY): Payer: Medicare HMO

## 2017-11-13 ENCOUNTER — Encounter (HOSPITAL_COMMUNITY): Payer: Self-pay | Admitting: Emergency Medicine

## 2017-11-13 ENCOUNTER — Inpatient Hospital Stay (HOSPITAL_COMMUNITY)
Admission: EM | Admit: 2017-11-13 | Discharge: 2017-11-15 | DRG: 023 | Disposition: A | Discharge status: Home Health | Payer: Medicare HMO | Attending: Neurology | Admitting: Neurology

## 2017-11-13 DIAGNOSIS — H518 Other specified disorders of binocular movement: Secondary | ICD-10-CM | POA: Diagnosis present

## 2017-11-13 DIAGNOSIS — G460 Middle cerebral artery syndrome: Secondary | ICD-10-CM | POA: Diagnosis present

## 2017-11-13 DIAGNOSIS — I481 Persistent atrial fibrillation: Secondary | ICD-10-CM | POA: Diagnosis present

## 2017-11-13 DIAGNOSIS — I130 Hypertensive heart and chronic kidney disease with heart failure and stage 1 through stage 4 chronic kidney disease, or unspecified chronic kidney disease: Secondary | ICD-10-CM | POA: Diagnosis present

## 2017-11-13 DIAGNOSIS — N183 Chronic kidney disease, stage 3 (moderate): Secondary | ICD-10-CM | POA: Diagnosis present

## 2017-11-13 DIAGNOSIS — R1310 Dysphagia, unspecified: Secondary | ICD-10-CM | POA: Diagnosis present

## 2017-11-13 DIAGNOSIS — Z6838 Body mass index (BMI) 38.0-38.9, adult: Secondary | ICD-10-CM

## 2017-11-13 DIAGNOSIS — R29722 NIHSS score 22: Secondary | ICD-10-CM | POA: Diagnosis present

## 2017-11-13 DIAGNOSIS — F419 Anxiety disorder, unspecified: Secondary | ICD-10-CM | POA: Diagnosis present

## 2017-11-13 DIAGNOSIS — I639 Cerebral infarction, unspecified: Secondary | ICD-10-CM | POA: Diagnosis not present

## 2017-11-13 DIAGNOSIS — I4891 Unspecified atrial fibrillation: Secondary | ICD-10-CM

## 2017-11-13 DIAGNOSIS — J690 Pneumonitis due to inhalation of food and vomit: Secondary | ICD-10-CM | POA: Diagnosis present

## 2017-11-13 DIAGNOSIS — R4701 Aphasia: Secondary | ICD-10-CM | POA: Diagnosis present

## 2017-11-13 DIAGNOSIS — E039 Hypothyroidism, unspecified: Secondary | ICD-10-CM | POA: Diagnosis present

## 2017-11-13 DIAGNOSIS — T17998A Other foreign object in respiratory tract, part unspecified causing other injury, initial encounter: Secondary | ICD-10-CM | POA: Diagnosis present

## 2017-11-13 DIAGNOSIS — N3281 Overactive bladder: Secondary | ICD-10-CM | POA: Diagnosis present

## 2017-11-13 DIAGNOSIS — G8929 Other chronic pain: Secondary | ICD-10-CM | POA: Diagnosis present

## 2017-11-13 DIAGNOSIS — R001 Bradycardia, unspecified: Secondary | ICD-10-CM | POA: Diagnosis not present

## 2017-11-13 DIAGNOSIS — R54 Age-related physical debility: Secondary | ICD-10-CM | POA: Diagnosis present

## 2017-11-13 DIAGNOSIS — K449 Diaphragmatic hernia without obstruction or gangrene: Secondary | ICD-10-CM | POA: Diagnosis present

## 2017-11-13 DIAGNOSIS — I5023 Acute on chronic systolic (congestive) heart failure: Secondary | ICD-10-CM | POA: Diagnosis present

## 2017-11-13 DIAGNOSIS — Z888 Allergy status to other drugs, medicaments and biological substances status: Secondary | ICD-10-CM

## 2017-11-13 DIAGNOSIS — I340 Nonrheumatic mitral (valve) insufficiency: Secondary | ICD-10-CM | POA: Diagnosis present

## 2017-11-13 DIAGNOSIS — I361 Nonrheumatic tricuspid (valve) insufficiency: Secondary | ICD-10-CM | POA: Diagnosis not present

## 2017-11-13 DIAGNOSIS — X58XXXA Exposure to other specified factors, initial encounter: Secondary | ICD-10-CM | POA: Diagnosis present

## 2017-11-13 DIAGNOSIS — Z79899 Other long term (current) drug therapy: Secondary | ICD-10-CM

## 2017-11-13 DIAGNOSIS — I6601 Occlusion and stenosis of right middle cerebral artery: Secondary | ICD-10-CM | POA: Diagnosis not present

## 2017-11-13 DIAGNOSIS — G709 Myoneural disorder, unspecified: Secondary | ICD-10-CM | POA: Diagnosis present

## 2017-11-13 DIAGNOSIS — I63411 Cerebral infarction due to embolism of right middle cerebral artery: Principal | ICD-10-CM | POA: Diagnosis present

## 2017-11-13 DIAGNOSIS — Z7901 Long term (current) use of anticoagulants: Secondary | ICD-10-CM | POA: Diagnosis not present

## 2017-11-13 DIAGNOSIS — Z5181 Encounter for therapeutic drug level monitoring: Secondary | ICD-10-CM | POA: Diagnosis not present

## 2017-11-13 DIAGNOSIS — J9600 Acute respiratory failure, unspecified whether with hypoxia or hypercapnia: Secondary | ICD-10-CM | POA: Diagnosis present

## 2017-11-13 DIAGNOSIS — G8194 Hemiplegia, unspecified affecting left nondominant side: Secondary | ICD-10-CM | POA: Diagnosis present

## 2017-11-13 DIAGNOSIS — E876 Hypokalemia: Secondary | ICD-10-CM | POA: Diagnosis present

## 2017-11-13 DIAGNOSIS — R402413 Glasgow coma scale score 13-15, at hospital admission: Secondary | ICD-10-CM | POA: Diagnosis present

## 2017-11-13 DIAGNOSIS — I482 Chronic atrial fibrillation: Secondary | ICD-10-CM | POA: Diagnosis present

## 2017-11-13 DIAGNOSIS — E669 Obesity, unspecified: Secondary | ICD-10-CM | POA: Diagnosis present

## 2017-11-13 DIAGNOSIS — M545 Low back pain: Secondary | ICD-10-CM | POA: Diagnosis present

## 2017-11-13 DIAGNOSIS — F329 Major depressive disorder, single episode, unspecified: Secondary | ICD-10-CM | POA: Diagnosis present

## 2017-11-13 DIAGNOSIS — D631 Anemia in chronic kidney disease: Secondary | ICD-10-CM | POA: Diagnosis present

## 2017-11-13 DIAGNOSIS — I480 Paroxysmal atrial fibrillation: Secondary | ICD-10-CM | POA: Diagnosis not present

## 2017-11-13 DIAGNOSIS — G4733 Obstructive sleep apnea (adult) (pediatric): Secondary | ICD-10-CM | POA: Diagnosis present

## 2017-11-13 DIAGNOSIS — J309 Allergic rhinitis, unspecified: Secondary | ICD-10-CM | POA: Diagnosis present

## 2017-11-13 DIAGNOSIS — E785 Hyperlipidemia, unspecified: Secondary | ICD-10-CM | POA: Diagnosis present

## 2017-11-13 DIAGNOSIS — R471 Dysarthria and anarthria: Secondary | ICD-10-CM | POA: Diagnosis present

## 2017-11-13 DIAGNOSIS — K2270 Barrett's esophagus without dysplasia: Secondary | ICD-10-CM | POA: Diagnosis present

## 2017-11-13 DIAGNOSIS — I5043 Acute on chronic combined systolic (congestive) and diastolic (congestive) heart failure: Secondary | ICD-10-CM

## 2017-11-13 DIAGNOSIS — K219 Gastro-esophageal reflux disease without esophagitis: Secondary | ICD-10-CM | POA: Diagnosis present

## 2017-11-13 DIAGNOSIS — J189 Pneumonia, unspecified organism: Secondary | ICD-10-CM

## 2017-11-13 DIAGNOSIS — R29810 Facial weakness: Secondary | ICD-10-CM | POA: Diagnosis present

## 2017-11-13 DIAGNOSIS — Z8719 Personal history of other diseases of the digestive system: Secondary | ICD-10-CM

## 2017-11-13 HISTORY — DX: Cerebral infarction, unspecified: I63.9

## 2017-11-13 HISTORY — PX: RADIOLOGY WITH ANESTHESIA: SHX6223

## 2017-11-13 LAB — COMPREHENSIVE METABOLIC PANEL
ALBUMIN: 2.8 g/dL — AB (ref 3.5–5.0)
ALT: 29 U/L (ref 14–54)
AST: 39 U/L (ref 15–41)
Alkaline Phosphatase: 73 U/L (ref 38–126)
Anion gap: 12 (ref 5–15)
BUN: 8 mg/dL (ref 6–20)
CHLORIDE: 106 mmol/L (ref 101–111)
CO2: 24 mmol/L (ref 22–32)
Calcium: 8.9 mg/dL (ref 8.9–10.3)
Creatinine, Ser: 1.32 mg/dL — ABNORMAL HIGH (ref 0.44–1.00)
GFR calc Af Amer: 44 mL/min — ABNORMAL LOW (ref >60)
GFR calc non Af Amer: 38 mL/min — ABNORMAL LOW (ref >60)
GLUCOSE: 102 mg/dL — AB (ref 65–99)
POTASSIUM: 2.8 mmol/L — AB (ref 3.5–5.1)
SODIUM: 142 mmol/L (ref 135–145)
Total Bilirubin: 0.8 mg/dL (ref 0.3–1.2)
Total Protein: 6.4 g/dL — ABNORMAL LOW (ref 6.5–8.1)

## 2017-11-13 LAB — CBC
HCT: 33.7 % — ABNORMAL LOW (ref 36.0–46.0)
Hemoglobin: 10.3 g/dL — ABNORMAL LOW (ref 12.0–15.0)
MCH: 28.1 pg (ref 26.0–34.0)
MCHC: 30.6 g/dL (ref 30.0–36.0)
MCV: 92.1 fL (ref 78.0–100.0)
Platelets: 310 10*3/uL (ref 150–400)
RBC: 3.66 MIL/uL — ABNORMAL LOW (ref 3.87–5.11)
RDW: 17.8 % — AB (ref 11.5–15.5)
WBC: 8 10*3/uL (ref 4.0–10.5)

## 2017-11-13 LAB — I-STAT CHEM 8, ED
BUN: 9 mg/dL (ref 6–20)
CHLORIDE: 103 mmol/L (ref 101–111)
CREATININE: 1.3 mg/dL — AB (ref 0.44–1.00)
Calcium, Ion: 1.09 mmol/L — ABNORMAL LOW (ref 1.15–1.40)
Glucose, Bld: 99 mg/dL (ref 65–99)
HEMATOCRIT: 32 % — AB (ref 36.0–46.0)
HEMOGLOBIN: 10.9 g/dL — AB (ref 12.0–15.0)
POTASSIUM: 2.8 mmol/L — AB (ref 3.5–5.1)
Sodium: 143 mmol/L (ref 135–145)
TCO2: 27 mmol/L (ref 22–32)

## 2017-11-13 LAB — PROTIME-INR
INR: 1.04
Prothrombin Time: 13.5 seconds (ref 11.4–15.2)

## 2017-11-13 LAB — DIFFERENTIAL
BASOS ABS: 0.1 10*3/uL (ref 0.0–0.1)
BASOS PCT: 1 %
EOS ABS: 0.2 10*3/uL (ref 0.0–0.7)
Eosinophils Relative: 3 %
Lymphocytes Relative: 24 %
Lymphs Abs: 1.9 10*3/uL (ref 0.7–4.0)
Monocytes Absolute: 0.8 10*3/uL (ref 0.1–1.0)
Monocytes Relative: 10 %
NEUTROS ABS: 5.1 10*3/uL (ref 1.7–7.7)
NEUTROS PCT: 62 %

## 2017-11-13 LAB — CBG MONITORING, ED: Glucose-Capillary: 97 mg/dL (ref 65–99)

## 2017-11-13 LAB — I-STAT TROPONIN, ED: Troponin i, poc: 0.01 ng/mL (ref 0.00–0.08)

## 2017-11-13 LAB — APTT: APTT: 31 s (ref 24–36)

## 2017-11-13 SURGERY — IR WITH ANESTHESIA
Anesthesia: General

## 2017-11-13 MED ORDER — STROKE: EARLY STAGES OF RECOVERY BOOK
Freq: Once | Status: DC
  Filled 2017-11-13: qty 1

## 2017-11-13 MED ORDER — FENTANYL CITRATE (PF) 250 MCG/5ML IJ SOLN
INTRAMUSCULAR | Status: DC | PRN
  Administered 2017-11-13: 100 ug via INTRAVENOUS

## 2017-11-13 MED ORDER — SODIUM CHLORIDE 0.9 % IV SOLN
INTRAVENOUS | Status: DC
  Administered 2017-11-14: 04:00:00 via INTRAVENOUS

## 2017-11-13 MED ORDER — SODIUM CHLORIDE 0.9 % IV SOLN
INTRAVENOUS | Status: DC | PRN
  Administered 2017-11-13: 23:00:00 via INTRAVENOUS

## 2017-11-13 MED ORDER — ASPIRIN 325 MG PO TABS
325.0000 mg | ORAL_TABLET | Freq: Every day | ORAL | Status: DC
  Administered 2017-11-14: 325 mg via ORAL
  Filled 2017-11-13: qty 1

## 2017-11-13 MED ORDER — SUCCINYLCHOLINE CHLORIDE 20 MG/ML IJ SOLN
INTRAMUSCULAR | Status: DC | PRN
  Administered 2017-11-13: 100 mg via INTRAVENOUS

## 2017-11-13 MED ORDER — POTASSIUM CHLORIDE 20 MEQ/15ML (10%) PO SOLN
40.0000 meq | Freq: Once | ORAL | Status: DC
  Filled 2017-11-13: qty 30

## 2017-11-13 MED ORDER — POTASSIUM CHLORIDE 10 MEQ/100ML IV SOLN
10.0000 meq | INTRAVENOUS | Status: AC
  Administered 2017-11-14: 10 meq via INTRAVENOUS
  Filled 2017-11-13: qty 100

## 2017-11-13 MED ORDER — IOPAMIDOL (ISOVUE-300) INJECTION 61%
INTRAVENOUS | Status: AC
  Filled 2017-11-13: qty 300

## 2017-11-13 MED ORDER — NITROGLYCERIN 1 MG/10 ML FOR IR/CATH LAB
INTRA_ARTERIAL | Status: AC
  Filled 2017-11-13: qty 10

## 2017-11-13 MED ORDER — ASPIRIN 300 MG RE SUPP: 300.0000 mg | Freq: Every day | RECTAL | Status: DC

## 2017-11-13 MED ORDER — NITROGLYCERIN 1 MG/10 ML FOR IR/CATH LAB
INTRA_ARTERIAL | Status: AC | PRN
  Administered 2017-11-13 – 2017-11-14 (×3): 25 ug via INTRA_ARTERIAL

## 2017-11-13 MED ORDER — LIDOCAINE HCL (CARDIAC) 20 MG/ML IV SOLN
INTRAVENOUS | Status: DC | PRN
  Administered 2017-11-13: 60 mg via INTRATRACHEAL

## 2017-11-13 MED ORDER — SENNOSIDES-DOCUSATE SODIUM 8.6-50 MG PO TABS: 1.0000 | ORAL_TABLET | Freq: Every evening | ORAL | Status: DC | PRN

## 2017-11-13 MED ORDER — ACETAMINOPHEN 160 MG/5ML PO SOLN
650.0000 mg | ORAL | Status: DC | PRN
Start: 2017-11-13 — End: 2017-11-15

## 2017-11-13 MED ORDER — IOPAMIDOL (ISOVUE-370) INJECTION 76%
INTRAVENOUS | Status: AC
  Administered 2017-11-13: 100 mL
  Filled 2017-11-13: qty 100

## 2017-11-13 MED ORDER — ACETAMINOPHEN 650 MG RE SUPP: 650.0000 mg | RECTAL | Status: DC | PRN

## 2017-11-13 MED ORDER — EPTIFIBATIDE 20 MG/10ML IV SOLN
INTRAVENOUS | Status: AC
  Filled 2017-11-13: qty 10

## 2017-11-13 MED ORDER — PROPOFOL 10 MG/ML IV BOLUS
INTRAVENOUS | Status: DC | PRN
  Administered 2017-11-13: 100 mg via INTRAVENOUS

## 2017-11-13 MED ORDER — ACETAMINOPHEN 325 MG PO TABS: 650.0000 mg | ORAL_TABLET | ORAL | Status: DC | PRN

## 2017-11-13 MED ORDER — HEPARIN SODIUM (PORCINE) 5000 UNIT/ML IJ SOLN
5000.0000 [IU] | Freq: Three times a day (TID) | INTRAMUSCULAR | Status: DC
  Administered 2017-11-14 – 2017-11-15 (×4): 5000 [IU] via SUBCUTANEOUS
  Filled 2017-11-13 (×4): qty 1

## 2017-11-13 MED ORDER — PHENYLEPHRINE HCL 10 MG/ML IJ SOLN
INTRAVENOUS | Status: DC | PRN
  Administered 2017-11-13: 10 ug/min via INTRAVENOUS

## 2017-11-13 MED ORDER — ROCURONIUM BROMIDE 100 MG/10ML IV SOLN
INTRAVENOUS | Status: DC | PRN
  Administered 2017-11-13: 50 mg via INTRAVENOUS

## 2017-11-13 MED ORDER — FENTANYL CITRATE (PF) 100 MCG/2ML IJ SOLN
INTRAMUSCULAR | Status: AC
  Filled 2017-11-13: qty 4

## 2017-11-13 MED ORDER — CEFAZOLIN SODIUM-DEXTROSE 2-3 GM-%(50ML) IV SOLR
INTRAVENOUS | Status: DC | PRN
  Administered 2017-11-13: 2 g via INTRAVENOUS

## 2017-11-13 MED ORDER — CEFAZOLIN SODIUM-DEXTROSE 2-4 GM/100ML-% IV SOLN
INTRAVENOUS | Status: AC
  Filled 2017-11-13: qty 100

## 2017-11-13 NOTE — Sedation Documentation (Signed)
Anesthesia case 

## 2017-11-13 NOTE — H&P (Signed)
Stroke admission H&P   CC: Acute onset of left-sided weakness  History is obtained from: Chart, patient's husband, EMTs  HPI: Teresa York is a 77 y.o. female past medical history of atrial fibrillation-unclear if she is on Xarelto currently, Barrett's esophagus with recent GI bleed, depression, chronic low back pain, congestive heart failure-systolic with a EF of 90-30%, hypertension, hypothyroidism, who was in her usual state of health when the patient and her husband went to take a nap around 8 PM.  At a few hours after that, she woke up not being able to talk properly and seemed to be in distress.  The husband also noted that she was not able to move the left side of her body.  EMS was called.  On their arrival, she had a rightward gaze preference as well as complete paralysis of the left side. A code stroke was activated and the patient was brought to the [facility] ER. On my initial evaluation, the patient's NIH stroke scale was 22 consistent with a complete right MCA syndrome. The husband was not able to tell me whether the patient was still taking her Xarelto and the last dose of Xarelto was. She had recently been evaluated in the emergency room at [facility] and then transferred to [facility] for GI workup.  Her Xarelto was discontinued for some time but he was not clear as to if she had started taking it again and if so when was the last dose. For that reason, IV TPA was not given. She was taken for a noncontrast CT of the head which did not reveal any acute bleed.  Dense right MCA was seen.  Aspects 67. CT angiogram of the head and neck showed a right M1 occlusion with poor collateralization. CT perfusion imaging showed a score of 28 cc and penumbra of 157 cc making her an amenable candidate for endovascular treatment. Endovascular team was contacted and an IR code was activated. Patient will be undergoing endovascular thrombectomy and then taken to the ICU.     LKW: 8 PM on 11/13/2017 tpa  given?: no,  On Xarelto with unclear last dose time Premorbid modified Rankin scale (mRS): 2  ROS: Unable to obtain due to altered mental status.   Past Medical History:  Diagnosis Date  . Allergic rhinitis   . Anxiety   . Atrial fibrillation ([place])   . Barrett's esophagus   . Breast cancer ([place]) 2005   "left"  . Chronic lower back pain   . Chronic systolic CHF (congestive heart failure) (H[facility])    a. EF 30-35% felt to be possibly be tachy mediated from afib wtih RVR  . Depression   . Diverticulosis   . GERD (gastroesophageal reflux disease)   . Hiatal hernia   . Hypertension   . Hypothyroidism   . Left atrial enlargement   . Mitral regurgitation   . OAB (overactive bladder)   . Persistent atrial fibrillation ([place]) 10/31/2016   a. s/p failed DCCV x3. Now on amiodarone.   . 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)    Family History  Problem Relation Age of Onset  . Heart attack Father 95  . Colon cancer Neg Hx   . Esophageal cancer Neg Hx   . Stomach cancer Neg Hx     Social History:   reports that  has never smoked. she has never used smokeless tobacco. She reports that she drinks alcohol. She reports that she does not use drugs.  Medications  Current Facility-Administered Medications:  .   stroke: mapping our early stages of recovery book, , Does not apply, Once, Amie Portland, MD .  0.9 %  sodium chloride infusion, , Intravenous, Continuous, Amie Portland, MD .  acetaminophen (TYLENOL) tablet 650 mg, 650 mg, Oral, Q4H PRN **OR** acetaminophen (TYLENOL) solution 650 mg, 650 mg, Per Tube, Q4H PRN **OR** acetaminophen (TYLENOL) suppository 650 mg, 650 mg, Rectal, Q4H PRN, Amie Portland, MD .  Derrill Memo ON 11/14/2017] aspirin suppository 300 mg, 300 mg, Rectal, Daily **OR** [START ON 11/14/2017] aspirin tablet 325 mg, 325 mg, Oral, Daily, Amie Portland, MD .  heparin injection 5,000 Units, 5,000 Units, Subcutaneous, Q8H, Amie Portland, MD .  senna-docusate  (Senokot-S) tablet 1 tablet, 1 tablet, Oral, QHS PRN, Amie Portland, MD  Current Outpatient Medications:  .  amiodarone (PACERONE) 400 MG tablet, Take 0.5 tablets (200 mg total) by mouth daily. (Patient taking differently: Take 400 mg by mouth daily. ), Disp: 90 tablet, Rfl: 2 .  cetirizine (ZYRTEC) 10 MG tablet, Take 10 mg by mouth daily. Reported on 10/27/2015, Disp: , Rfl:  .  Cholecalciferol (D 1000) 1000 units capsule, Take 1,000 Units by mouth daily. Reported on 11/03/2015, Disp: , Rfl:  .  ferrous sulfate (SM IRON) 325 (65 FE) MG tablet, Take 325 mg by mouth daily. , Disp: , Rfl:  .  FLUoxetine (PROZAC) 40 MG capsule, Take 40 mg by mouth daily. , Disp: , Rfl:  .  furosemide (LASIX) 40 MG tablet, Take 1 tablet (40 mg total) by mouth 2 (two) times daily. (Patient taking differently: Take 40 mg by mouth daily. ), Disp: 60 tablet, Rfl: 7 .  levothyroxine (SYNTHROID, LEVOTHROID) 175 MCG tablet, Take 1 tablet (175 mcg total) by mouth daily before breakfast., Disp: 30 tablet, Rfl: 0 .  metoprolol succinate (TOPROL-XL) 100 MG 24 hr tablet, TAKE ONE TABLET BY MOUTH ONCE DAILY (Patient taking differently: Take 100 mg by mouth daily. ), Disp: 90 tablet, Rfl: 3 .  Multiple Vitamins-Minerals (OCUVITE PRESERVISION PO), Take 1 tablet by mouth daily., Disp: , Rfl:  .  omeprazole (PRILOSEC) 40 MG capsule, Take 40 mg by mouth 2 (two) times daily., Disp: , Rfl:  .  potassium chloride SA (K-DUR,KLOR-CON) 20 MEQ tablet, Take 1 tablet (20 mEq total) by mouth 2 (two) times daily. (Patient taking differently: Take 20 mEq by mouth daily. ), Disp: 60 tablet, Rfl: 3 .  Rivaroxaban (XARELTO) 20 MG TABS tablet, Take 1 tablet (20 mg total) by mouth daily with supper. TAKE ONE TABLET BY MOUTH ONCE DAILY WITH SUPPER (Patient taking differently: Take 20 mg by mouth daily with supper. ), Disp: 30 tablet, Rfl: 11 .  traZODone (DESYREL) 150 MG tablet, Take 75 mg by mouth at bedtime as needed for sleep. , Disp: , Rfl:  .  zolpidem  (AMBIEN) 10 MG tablet, Take 10 mg by mouth at bedtime as needed for sleep., Disp: , Rfl:    Exam: Current vital signs: BP (!) 148/96 (BP Location: Right Arm)   Pulse 77   Temp 98.7 F (37.1 C) (Oral)   Resp 16   Ht 5\' 2"  (1.575 m)   Wt 94.6 kg (208 lb 8.9 oz)   LMP  (LMP Unknown)   SpO2 95%   BMI 38.15 kg/m  Vital signs in last 24 hours: Temp:  [98.7 F (37.1 C)] 98.7 F (37.1 C) (02/06 2210) Pulse Rate:  [77] 77 (02/06 2210) Resp:  [16] 16 (02/06 2210) BP: (148)/(96) 148/96 (  02/06 2210) SpO2:  [95 %] 95 % (02/06 2210) Weight:  [94.6 kg (208 lb 8.9 oz)] 94.6 kg (208 lb 8.9 oz) (02/06 2200) General: Awake, alert in no acute distress HEENT: Normocephalic, atraumatic, Some drooling from the mouth  Lungs: Clear to auscultation Abdomen: Nondistended nontender Extremities: Warm well perfused with intact pulses Cardiovascular: S1-S2 heard, r irregularly irregular Neurological exam: Mental status: Patient is awake, alert, in no acute distress Language: Her speech is severely dysarthric.  It is very difficult to interpret what she is saying but she is able to name her husband, and follow commands appropriately. Cranial nerves: Pupils equal round reactive to light, forced gaze deviation to the right, no blink to threat from the right, flaccid lower face on the right, intact hearing. Motor exam: Flaccid 0/5 left upper and lower extremities.  Normal 5/5 right upper and lower extremities. Sensory: Dense sensory loss on the left.  Complete inject of the left hemibody. Coordination: Intact coordination on the right.  Unable to perform on the left. Gait examination was deferred at this point due to severe hemiplegia NIHSS on arrival-22  Labs I have reviewed labs in epic and the results pertinent to this consultation are: Potassium 2.8  CBC    Component Value Date/Time   WBC 8.0 11/13/2017 2207   RBC 3.66 (L) 11/13/2017 2207   HGB 10.9 (L) 11/13/2017 2211   HGB 14.8 12/17/2016 1118    HGB 13.4 01/12/2011 1410   HCT 32.0 (L) 11/13/2017 2211   HCT 45.1 12/17/2016 1118   HCT 40.3 01/12/2011 1410   PLT 310 11/13/2017 2207   PLT 265 12/17/2016 1118   MCV 92.1 11/13/2017 2207   MCV 85 12/17/2016 1118   MCV 84.4 01/12/2011 1410   MCH 28.1 11/13/2017 2207   MCHC 30.6 11/13/2017 2207   RDW 17.8 (H) 11/13/2017 2207   RDW 15.1 12/17/2016 1118   RDW 14.1 01/12/2011 1410   LYMPHSABS 1.9 11/13/2017 2207   LYMPHSABS 1.0 01/12/2011 1410   MONOABS 0.8 11/13/2017 2207   MONOABS 0.4 01/12/2011 1410   EOSABS 0.2 11/13/2017 2207   EOSABS 0.5 01/12/2011 1410   BASOSABS 0.1 11/13/2017 2207   BASOSABS 0.0 01/12/2011 1410  CMP     Component Value Date/Time   NA 143 11/13/2017 2211   NA 140 12/17/2016 1118   K 2.8 (L) 11/13/2017 2211   CL 103 11/13/2017 2211   CO2 24 11/13/2017 2207   GLUCOSE 99 11/13/2017 2211   BUN 9 11/13/2017 2211   BUN 26 12/17/2016 1118   CREATININE 1.30 (H) 11/13/2017 2211   CALCIUM 8.9 11/13/2017 2207   PROT 6.4 (L) 11/13/2017 2207   ALBUMIN 2.8 (L) 11/13/2017 2207   AST 39 11/13/2017 2207   ALT 29 11/13/2017 2207   ALKPHOS 73 11/13/2017 2207   BILITOT 0.8 11/13/2017 2207   GFRNONAA 38 (L) 11/13/2017 2207   GFRAA 44 (L) 11/13/2017 2207    Lipid Panel     Component Value Date/Time   CHOL 118 11/16/2016 0900   TRIG 77 11/16/2016 0900   HDL 37 (L) 11/16/2016 0900   CHOLHDL 3.2 11/16/2016 0900   VLDL 15 11/16/2016 0900   LDLCALC 66 11/16/2016 0900   Imaging I have reviewed the images obtained: CT-scan of the brain -hyperdense right MCA, early infarct changes seen in the right MCA territory scattered.  Aspects 7. CT angiogram of the head and neck shows a right M1 cutoff.  CT perfusion shows small core of 28 cc with  a penumbra 157 cc with a large mismatch.  Assessment:  77 year old woman with a past medical history of atrial fibrillation who was on Xarelto- recent GI bleed for which Xarelto was discontinued and unclear if it was restarted  and when the last dose was, chronic low back pain, congestive heart failure, hypertension, hypothyroidism with acute onset of left-sided weakness and right gaze preference along with dysarthria and left-sided facial droop.  Examination consistent with a right MCA syndrome. NIH stroke scale 22 on arrival. Due to unclear history of Xarelto use and unknown last dose, IV TPA was not used.  Tried to confirm multiple times with the husband if she had stopped taking the Xarelto but she still taking it but he could not deny or confirm as he said that the patient takes her medications are soft and he was not sure if she had last taken it today or not. She was a candidate for endovascular thrombectomy.  Endovascular team was paged and patient was taken in for DVT. She will be transferred to the ICU after endovascular treatment.  IMPRESSION 1. Acute Ischemic Stroke Cerebral infarction due to embolism of right middle cerebral artery Acuity: Acute Current Suspected Etiology: Cardioembolic 2. HTN 3. CHF 4,Probable Aspiration pneumonia 5.Recent GIB 6. Afib 7. CKD 3  Plan: Continue Evaluation:  -Admit to: NICU -Continue Aspirin/ Statin -Systolic blood pressure goal depending on the eventual result of endovascular thrombectomy.  If successful thrombectomy, systolic blood pressure goal 100-140.  If thrombectomy unsuccessful, allow for permissive hypertension only treating if systolic blood pressure goes above 220. -MRI/ECHO/A1C/Lipid panel. -Hyperglycemia management per SSI to maintain glucose 140-180mg /dL. -PT/OT/ST therapies and recommendations when able  CNS -Close neuro monitoring  Dysarthria Dysphagia following cerebral infarction  -NPO until cleared by speech -ST -May need PEG  Hemiplegia and hemiparesis following cerebral infarction affecting left non-dominant side  -PT/OT -PM&R consult  RESP Acute Respiratory Failure -intubated for endovascular thrombectomy. -vent management per  ICU -wean when able  CV Essential hypertension -Blood pressure goals as above.  Heart failure, Acute on chronic systolic (congestive) heart failure  -TTE -Would appreciate critical care assistance in medical management.  Hyperlipidemia, unspecified  - Statin for goal LDL < 70  Chronic atrial fibrillation -Rate control -Will eventually need to be on anticoagulation.  Start time to be determined.  HEME -Monitor CBC -transfuse for hgb < 7 -Trend PT/PTT/INR  ENDO -goal HgbA1c < 7  GI/GU CKD Stage 3 (GFR 30-59) -Gentle hydration -avoid nephrotoxic agents  Fluid/Electrolyte Disorders Hypokalemia -Replete -Trend  Recent GI bleed -monitor H&H  ID Possible Aspiration PNA -CXR -NPO -Monitor  Nutrition E66.9 Obesity  -diet consult  Prophylaxis DVT: sqh, SCD   GI: PPI per ICU vent orderset Bowel: Doc/senna  Diet: NPO until cleared by speech  Code Status: Full Code    Present on admission: Acute ischemic stroke, hemiplegia, probable aspiration pneumonia, chronic systolic CHF, CKD stage III,  -- Amie Portland, MD [facility] Pager: [phone] If 7pm to 7am, please call on call as listed on AMION.  CODE STROKE [place] -Code stroke paged at 9:50 PM with an ET at 25-30 minutes. -Patient arrived much sooner than the paged out ETA -Decision to not use TPA made upon history of unclear Xarelto use right at the time of CT -First contact with endovascular team 10:21 PM. -Second contact with endovascular team after CTA and perfusion images up at 10:37 PM. -IR team paged out 10:43 PM. -Some delay with intubation by anesthesia due to not receiving  page when IR page sent out  [place] This patient is critically ill and at significant risk of neurological worsening, death and care requires constant monitoring of vital signs, hemodynamics,respiratory and cardiac monitoring. I spent 65  minutes of neurocritical care time performing  neurological assessment, discussion with family, other specialists and medical decision making of high complexityin the care of  this patient.

## 2017-11-13 NOTE — ED Notes (Signed)
Groin shaved, lower extremity pulses marked using doppler.

## 2017-11-13 NOTE — ED Triage Notes (Addendum)
Per [facility]: Pt to ED as Code Stroke - husband stated that he was sleeping next to her. LKW 2000, at which time patient began moaning, her husband nudged her, she was non-verbal, had R forced gaze. Patient has dysphagia and dysarthria, R forced gaze, and L sided weakness. Patient A&O x 4. She has hx recent GI bleed, seen at [facility] was taken off Xarelto - husband does not know if she's been taking her Xarelto or when her last dose was as patient does her own medications. Airway patent, though patient appears to be clenching her jaw and drooling slightly. Patient denies pain, able to follow commands. Husband reports that patient was doing housework earlier today, normally self-sufficient and ambulates at home. Hx A-Fib, HR 87 with EMS, CBG 119. Bilateral 18g. ACs.

## 2017-11-13 NOTE — Anesthesia Preprocedure Evaluation (Addendum)
Anesthesia Evaluation  Patient identified by MRN, date of birth, ID band Patient unresponsive    Reviewed: Allergy & Precautions, H&P , Patient's Chart, lab work & pertinent test results, Unable to perform ROS - Chart review only  Airway Mallampati: II   Neck ROM: full  Mouth opening: Limited Mouth Opening  Dental  (+) Teeth Intact   Pulmonary neg pulmonary ROS,    breath sounds clear to auscultation       Cardiovascular hypertension, + dysrhythmias Atrial Fibrillation  Rhythm:regular Rate:Normal     Neuro/Psych PSYCHIATRIC DISORDERS Anxiety Depression  Neuromuscular disease CVA    GI/Hepatic hiatal hernia, GERD  ,  Endo/Other  Hypothyroidism obese  Renal/GU      Musculoskeletal   Abdominal   Peds  Hematology   Anesthesia Other Findings   Reproductive/Obstetrics                            Anesthesia Physical  Anesthesia Plan  ASA: IV and emergent  Anesthesia Plan: General   Post-op Pain Management:    Induction: Intravenous, Rapid sequence and Cricoid pressure planned  PONV Risk Score and Plan: 2 and Ondansetron, Dexamethasone and Treatment may vary due to age or medical condition  Airway Management Planned: Oral ETT  Additional Equipment:   Intra-op Plan:   Post-operative Plan: Post-operative intubation/ventilation  Informed Consent: I have reviewed the patients History and Physical, chart, labs and discussed the procedure including the risks, benefits and alternatives for the proposed anesthesia with the patient or authorized representative who has indicated his/her understanding and acceptance.   Only emergency history available and History available from chart only  Plan Discussed with: CRNA, Anesthesiologist and Surgeon  Anesthesia Plan Comments:        Anesthesia Quick Evaluation

## 2017-11-13 NOTE — Anesthesia Procedure Notes (Signed)
Procedure Name: Intubation Date/Time: 11/13/2017 11:39 PM Performed by: Clovis Cao, CRNA Pre-anesthesia Checklist: Patient identified, Emergency Drugs available, Suction available, Patient being monitored and Timeout performed Patient Re-evaluated:Patient Re-evaluated prior to induction Oxygen Delivery Method: Circle system utilized Preoxygenation: Pre-oxygenation with 100% oxygen Induction Type: IV induction, Rapid sequence and Cricoid Pressure applied Laryngoscope Size: McGraph Grade View: Grade I Tube type: Subglottic suction tube Tube size: 7.5 mm Number of attempts: 1 Airway Equipment and Method: Stylet and Video-laryngoscopy Placement Confirmation: ETT inserted through vocal cords under direct vision,  positive ETCO2 and breath sounds checked- equal and bilateral Secured at: 22 cm Tube secured with: Tape Dental Injury: Teeth and Oropharynx as per pre-operative assessment

## 2017-11-13 NOTE — ED Provider Notes (Signed)
[facility] EMERGENCY DEPARTMENT Provider Note   CSN: [id number] Arrival date & time: 11/13/17  2206   An emergency department physician performed an initial assessment on this suspected stroke patient at 2203.  History   Chief Complaint Chief Complaint  Patient presents with  . Code Stroke    HPI Teresa York is a 77 y.o. female.  The history is provided by the patient and medical records.     LEVEL V CAVEAT:  EMERGENT SITUATION Patient with hx of allergic rhinitis, AFIB questionable if on anticoagulation due to GI bleeding, breast cancer, CHF, depression, GERD, HTN, hypothyroidism, presenting to the ED as a code stroke.  LKW around 8 PM when patient and husband went to take a nap. When she woke up husband noted she was unable to move and seemed to be uncomfortable so husband called EMS.  Patient with left sided hemiparesis on arrival here.    Past Medical History:  Diagnosis Date  . Allergic rhinitis   . Anxiety   . Atrial fibrillation ([place])   . Barrett's esophagus   . Breast cancer ([place]) 2005   "left"  . Chronic lower back pain   . Chronic systolic CHF (congestive heart failure) (HCC)    a. EF 30-35% felt to be possibly be tachy mediated from afib wtih RVR  . Depression   . Diverticulosis   . GERD (gastroesophageal reflux disease)   . Hiatal hernia   . Hypertension   . Hypothyroidism   . Left atrial enlargement   . Mitral regurgitation   . OAB (overactive bladder)   . Persistent atrial fibrillation ([place]) 10/31/2016   a. s/p failed DCCV x3. Now on amiodarone.   . 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)    Patient Active Problem List   Diagnosis Date Noted  . Persistent atrial fibrillation ([place])   . Chronic systolic CHF (congestive heart failure) ([place])   . Hypertension   . Hypothyroidism   . Acute on chronic combined systolic and diastolic CHF (congestive heart failure) ([place]) 11/12/2016  . Aortic valve regurgitation  10/31/2016  . Atrial fibrillation with RVR ([place]) 10/31/2016  . Barrett's esophagus without dysplasia 10/27/2015  . Dysphagia 10/27/2015  . Loss of weight 10/27/2015  . Chronic anticoagulation 10/27/2015    Past Surgical History:  Procedure Laterality Date  . BACK SURGERY    . BREAST BIOPSY Left 2005  . BREAST LUMPECTOMY Left 2005  . CARDIOVERSION N/A 11/02/2016   Procedure: CARDIOVERSION;  Surgeon: Thayer Headings, MD;  Location: [facility];  Service: Cardiovascular;  Laterality: N/A;  . CARDIOVERSION N/A 11/16/2016   Procedure: CARDIOVERSION;  Surgeon: Fay Records, MD;  Location: [facility];  Service: Cardiovascular;  Laterality: N/A;  . CARDIOVERSION N/A 12/24/2016   Procedure: CARDIOVERSION;  Surgeon: Sanda Klein, MD;  Location: [facility];  Service: Cardiovascular;  Laterality: N/A;  . CARPAL TUNNEL RELEASE Bilateral 1995  . COLONOSCOPY    . ELBOW FRACTURE SURGERY Right 2009   with implant  . ESOPHAGOGASTRODUODENOSCOPY    . ESOPHAGUS SURGERY     "in the process of getting cancerous cell off my esophagus" (10/31/2016)  . FRACTURE SURGERY    . LUMBAR DISC SURGERY     L5  . MOHS SURGERY     "corner of right eye; on left cheek"  . TEE WITHOUT CARDIOVERSION N/A 11/16/2016   Procedure: TRANSESOPHAGEAL ECHOCARDIOGRAM (TEE);  Surgeon: Fay Records, MD;  Location: [facility];  Service: Cardiovascular;  Laterality: N/A;  . TONSILLECTOMY    . TUBAL LIGATION  1980    OB History    No data available       Home Medications    Prior to Admission medications   Medication Sig Start Date End Date Taking? Authorizing Provider  amiodarone (PACERONE) 400 MG tablet Take 0.5 tablets (200 mg total) by mouth daily. Patient taking differently: Take 400 mg by mouth daily.  01/30/17   Allred, Jeneen Rinks, MD  cetirizine (ZYRTEC) 10 MG tablet Take 10 mg by mouth daily. Reported on 10/27/2015    [provider]  Cholecalciferol (D 1000) 1000 units capsule Take 1,000 Units by mouth  daily. Reported on 11/03/2015 11/30/14   [provider]  ferrous sulfate (SM IRON) 325 (65 FE) MG tablet Take 325 mg by mouth daily.  11/30/14   [provider]  FLUoxetine (PROZAC) 40 MG capsule Take 40 mg by mouth daily.  08/24/11   [provider]  furosemide (LASIX) 40 MG tablet Take 1 tablet (40 mg total) by mouth 2 (two) times daily. Patient taking differently: Take 40 mg by mouth daily.  05/13/17   Burnell Blanks, MD  levothyroxine (SYNTHROID, LEVOTHROID) 175 MCG tablet Take 1 tablet (175 mcg total) by mouth daily before breakfast. 11/20/16   Delos Haring, PA-C  metoprolol succinate (TOPROL-XL) 100 MG 24 hr tablet TAKE ONE TABLET BY MOUTH ONCE DAILY Patient taking differently: Take 100 mg by mouth daily.  12/17/16   Imogene Burn, PA-C  Multiple Vitamins-Minerals (OCUVITE PRESERVISION PO) Take 1 tablet by mouth daily.    [provider]  omeprazole (PRILOSEC) 40 MG capsule Take 40 mg by mouth 2 (two) times daily.    [provider]  potassium chloride SA (K-DUR,KLOR-CON) 20 MEQ tablet Take 1 tablet (20 mEq total) by mouth 2 (two) times daily. Patient taking differently: Take 20 mEq by mouth daily.  11/20/16   Delos Haring, PA-C  Rivaroxaban (XARELTO) 20 MG TABS tablet Take 1 tablet (20 mg total) by mouth daily with supper. TAKE ONE TABLET BY MOUTH ONCE DAILY WITH SUPPER Patient taking differently: Take 20 mg by mouth daily with supper.  01/30/17   Allred, Jeneen Rinks, MD  traZODone (DESYREL) 150 MG tablet Take 75 mg by mouth at bedtime as needed for sleep.  11/06/13   [provider]  zolpidem (AMBIEN) 10 MG tablet Take 10 mg by mouth at bedtime as needed for sleep.    [provider]    Family History Family History  Problem Relation Age of Onset  . Heart attack Father 95  . Colon cancer Neg Hx   . Esophageal cancer Neg Hx   . Stomach cancer Neg Hx     Social History Social History   Tobacco Use  . Smoking  status: Never Smoker  . Smokeless tobacco: Never Used  Substance Use Topics  . Alcohol use: Yes    Alcohol/week: 0.0 oz    Comment: 1-2 per week  . Drug use: No     Allergies   Benazepril and Diltiazem hcl   Review of Systems Review of Systems  Unable to perform ROS: Other     Physical Exam Updated Vital Signs BP (!) 148/96 (BP Location: Right Arm)   Pulse 77   Temp 98.7 F (37.1 C) (Oral)   Resp 16   Ht 5\' 2"  (1.575 m)   Wt 94.6 kg (208 lb 8.9 oz)   LMP  (LMP Unknown)   SpO2 95%  BMI 38.15 kg/m   Physical Exam  Constitutional: She appears well-developed and well-nourished.  HENT:  Head: Normocephalic and atraumatic.  Mouth/Throat: Oropharynx is clear and moist.  Protecting airway, handling secretions  Eyes: Conjunctivae and EOM are normal. Pupils are equal, round, and reactive to light.  Neck: Normal range of motion.  Cardiovascular: Normal rate, regular rhythm and normal heart sounds.  Pulmonary/Chest: Effort normal and breath sounds normal. No stridor. No respiratory distress.  Abdominal: Soft. Bowel sounds are normal. There is no tenderness. There is no rebound.  Musculoskeletal: Normal range of motion.  Neurological: She is alert.  Awake, alert; left sided hemiparesis with sensory loss, right side motor and sensory appears intact; speech is garbled and difficult to understand but able to follow commands when prompted  Skin: Skin is warm and dry.  Psychiatric: She has a normal mood and affect.  Nursing note and vitals reviewed.    ED Treatments / Results  Labs (all labs ordered are listed, but only abnormal results are displayed) Labs Reviewed  CBC - Abnormal; Notable for the following components:      Result Value   RBC 3.66 (*)    Hemoglobin 10.3 (*)    HCT 33.7 (*)    RDW 17.8 (*)    All other components within normal limits  COMPREHENSIVE METABOLIC PANEL - Abnormal; Notable for the following components:   Potassium 2.8 (*)    Glucose, Bld  102 (*)    Creatinine, Ser 1.32 (*)    Total Protein 6.4 (*)    Albumin 2.8 (*)    GFR calc non Af Amer 38 (*)    GFR calc Af Amer 44 (*)    All other components within normal limits  I-STAT CHEM 8, ED - Abnormal; Notable for the following components:   Potassium 2.8 (*)    Creatinine, Ser 1.30 (*)    Calcium, Ion 1.09 (*)    Hemoglobin 10.9 (*)    HCT 32.0 (*)    All other components within normal limits  PROTIME-INR  APTT  DIFFERENTIAL  HEMOGLOBIN A1C  LIPID PANEL  CBC  CREATININE, SERUM  CBG MONITORING, ED  I-STAT TROPONIN, ED  CBG MONITORING, ED    EKG  EKG Interpretation None       Radiology Ct Angio Head W Or Wo Contrast  Result Date: 11/13/2017 CLINICAL DATA:  77 y/o  F; left facial droop and dysphagia. EXAM: CT ANGIOGRAPHY HEAD AND NECK CT PERFUSION BRAIN TECHNIQUE: Multidetector CT imaging of the head and neck was performed using the standard protocol during bolus administration of intravenous contrast. Multiplanar CT image reconstructions and MIPs were obtained to evaluate the vascular anatomy. Carotid stenosis measurements (when applicable) are obtained utilizing NASCET criteria, using the distal internal carotid diameter as the denominator. Multiphase CT imaging of the brain was performed following IV bolus contrast injection. Subsequent parametric perfusion maps were calculated using RAPID software. CONTRAST:  133mL ISOVUE-370 IOPAMIDOL (ISOVUE-370) INJECTION 76% COMPARISON:  None. FINDINGS: CTA NECK FINDINGS Aortic arch: Bovine variant branching. Imaged portion shows no evidence of aneurysm or dissection. No significant stenosis of the major arch vessel origins. Moderate calcific atherosclerosis. Right carotid system: Dense calcified plaque of the right carotid bifurcation with moderate 50-70% proximal ICA stenosis. Left carotid system: Dense calcified plaque of the left carotid bifurcation with mild to moderate 50% proximal ICA stenosis. Severe stenosis of the left  external carotid artery origin. Vertebral arteries: Left dominant. No evidence of dissection, stenosis (50% or greater) or  occlusion. Skeleton: Mild cervical spondylosis. No high-grade bony canal stenosis. Other neck: Patulous esophagus. Upper chest: Ground-glass opacities within the lung apices probably representing pulmonary edema, possibly neurogenic given stroke. Small bilateral effusions. Review of the MIP images confirms the above findings CTA HEAD FINDINGS Anterior circulation: Right proximal M1 occlusion. Poor right MCA distribution collateralization. Calcific atherosclerosis of carotid siphons with mild less than 50% bilateral paraclinoid stenosis. Patent bilateral anterior cerebral arteries and left middle cerebral artery. Posterior circulation: Bilateral V4 short segments of dense calcified plaque with severe greater than 70% right and moderate 50-70% left stenosis. Patent basilar and posterior cerebral arteries. Left P1 2 junction mild stenosis. Venous sinuses: As permitted by contrast timing, patent. Anatomic variants: Anterior communicating artery and right posterior communicating artery identified. No left posterior communicating artery identified, likely hypoplastic or absent. Delayed phase: No abnormal intracranial enhancement. Review of the MIP images confirms the above findings CT Brain Perfusion Findings: CBF (<30%) Volume: 38mL Perfusion (Tmax>6.0s) volume: 168mL Mismatch Volume: 141mL Infarction Location:Right MCA distribution IMPRESSION: CT brain perfusion: 1. Core infarct 28 cc, right MCA distribution. 2. Ischemic penumbra 129 cc. CTA head: 1. Right M1 proximal occlusion. Poor right MCA distribution collateralization. 2. Otherwise patent anterior and posterior circulations without aneurysm or occlusion. 3. Bilateral ICA paraclinoid and left P1/2 mild stenosis. 4. Bilateral V4 calcified plaque with severe right and moderate left stenosis. CTA neck: 1. Right proximal ICA moderate 50-70%  stenosis with dense calcified plaque. 2. Left proximal ICA mild to moderate 50% stenosis with dense calcified plaque. 3. No significant stenosis of vertebral arteries. 4. No dissection or aneurysm of carotid or vertebral arteries in the neck. 5. Ground-glass opacities in lung apices, probably pulmonary edema, possibly neurogenic given stroke. Small effusions. These results were called by telephone at the time of interpretation on 11/13/2017 at 10:42 pm to Dr. Amie Portland , who verbally acknowledged these results. Electronically Signed   By: Kristine Garbe M.D.   On: 11/13/2017 22:56   Ct Angio Neck W Or Wo Contrast  Result Date: 11/13/2017 CLINICAL DATA:  77 y/o  F; left facial droop and dysphagia. EXAM: CT ANGIOGRAPHY HEAD AND NECK CT PERFUSION BRAIN TECHNIQUE: Multidetector CT imaging of the head and neck was performed using the standard protocol during bolus administration of intravenous contrast. Multiplanar CT image reconstructions and MIPs were obtained to evaluate the vascular anatomy. Carotid stenosis measurements (when applicable) are obtained utilizing NASCET criteria, using the distal internal carotid diameter as the denominator. Multiphase CT imaging of the brain was performed following IV bolus contrast injection. Subsequent parametric perfusion maps were calculated using RAPID software. CONTRAST:  158mL ISOVUE-370 IOPAMIDOL (ISOVUE-370) INJECTION 76% COMPARISON:  None. FINDINGS: CTA NECK FINDINGS Aortic arch: Bovine variant branching. Imaged portion shows no evidence of aneurysm or dissection. No significant stenosis of the major arch vessel origins. Moderate calcific atherosclerosis. Right carotid system: Dense calcified plaque of the right carotid bifurcation with moderate 50-70% proximal ICA stenosis. Left carotid system: Dense calcified plaque of the left carotid bifurcation with mild to moderate 50% proximal ICA stenosis. Severe stenosis of the left external carotid artery origin.  Vertebral arteries: Left dominant. No evidence of dissection, stenosis (50% or greater) or occlusion. Skeleton: Mild cervical spondylosis. No high-grade bony canal stenosis. Other neck: Patulous esophagus. Upper chest: Ground-glass opacities within the lung apices probably representing pulmonary edema, possibly neurogenic given stroke. Small bilateral effusions. Review of the MIP images confirms the above findings CTA HEAD FINDINGS Anterior circulation: Right proximal M1 occlusion. Poor right  MCA distribution collateralization. Calcific atherosclerosis of carotid siphons with mild less than 50% bilateral paraclinoid stenosis. Patent bilateral anterior cerebral arteries and left middle cerebral artery. Posterior circulation: Bilateral V4 short segments of dense calcified plaque with severe greater than 70% right and moderate 50-70% left stenosis. Patent basilar and posterior cerebral arteries. Left P1 2 junction mild stenosis. Venous sinuses: As permitted by contrast timing, patent. Anatomic variants: Anterior communicating artery and right posterior communicating artery identified. No left posterior communicating artery identified, likely hypoplastic or absent. Delayed phase: No abnormal intracranial enhancement. Review of the MIP images confirms the above findings CT Brain Perfusion Findings: CBF (<30%) Volume: 67mL Perfusion (Tmax>6.0s) volume: 116mL Mismatch Volume: 15mL Infarction Location:Right MCA distribution IMPRESSION: CT brain perfusion: 1. Core infarct 28 cc, right MCA distribution. 2. Ischemic penumbra 129 cc. CTA head: 1. Right M1 proximal occlusion. Poor right MCA distribution collateralization. 2. Otherwise patent anterior and posterior circulations without aneurysm or occlusion. 3. Bilateral ICA paraclinoid and left P1/2 mild stenosis. 4. Bilateral V4 calcified plaque with severe right and moderate left stenosis. CTA neck: 1. Right proximal ICA moderate 50-70% stenosis with dense calcified plaque.  2. Left proximal ICA mild to moderate 50% stenosis with dense calcified plaque. 3. No significant stenosis of vertebral arteries. 4. No dissection or aneurysm of carotid or vertebral arteries in the neck. 5. Ground-glass opacities in lung apices, probably pulmonary edema, possibly neurogenic given stroke. Small effusions. These results were called by telephone at the time of interpretation on 11/13/2017 at 10:42 pm to Dr. Amie Portland , who verbally acknowledged these results. Electronically Signed   By: Kristine Garbe M.D.   On: 11/13/2017 22:56   Ct Cerebral Perfusion W Contrast  Result Date: 11/13/2017 CLINICAL DATA:  77 y/o  F; left facial droop and dysphagia. EXAM: CT ANGIOGRAPHY HEAD AND NECK CT PERFUSION BRAIN TECHNIQUE: Multidetector CT imaging of the head and neck was performed using the standard protocol during bolus administration of intravenous contrast. Multiplanar CT image reconstructions and MIPs were obtained to evaluate the vascular anatomy. Carotid stenosis measurements (when applicable) are obtained utilizing NASCET criteria, using the distal internal carotid diameter as the denominator. Multiphase CT imaging of the brain was performed following IV bolus contrast injection. Subsequent parametric perfusion maps were calculated using RAPID software. CONTRAST:  153mL ISOVUE-370 IOPAMIDOL (ISOVUE-370) INJECTION 76% COMPARISON:  None. FINDINGS: CTA NECK FINDINGS Aortic arch: Bovine variant branching. Imaged portion shows no evidence of aneurysm or dissection. No significant stenosis of the major arch vessel origins. Moderate calcific atherosclerosis. Right carotid system: Dense calcified plaque of the right carotid bifurcation with moderate 50-70% proximal ICA stenosis. Left carotid system: Dense calcified plaque of the left carotid bifurcation with mild to moderate 50% proximal ICA stenosis. Severe stenosis of the left external carotid artery origin. Vertebral arteries: Left dominant. No  evidence of dissection, stenosis (50% or greater) or occlusion. Skeleton: Mild cervical spondylosis. No high-grade bony canal stenosis. Other neck: Patulous esophagus. Upper chest: Ground-glass opacities within the lung apices probably representing pulmonary edema, possibly neurogenic given stroke. Small bilateral effusions. Review of the MIP images confirms the above findings CTA HEAD FINDINGS Anterior circulation: Right proximal M1 occlusion. Poor right MCA distribution collateralization. Calcific atherosclerosis of carotid siphons with mild less than 50% bilateral paraclinoid stenosis. Patent bilateral anterior cerebral arteries and left middle cerebral artery. Posterior circulation: Bilateral V4 short segments of dense calcified plaque with severe greater than 70% right and moderate 50-70% left stenosis. Patent basilar and posterior cerebral arteries. Left P1  2 junction mild stenosis. Venous sinuses: As permitted by contrast timing, patent. Anatomic variants: Anterior communicating artery and right posterior communicating artery identified. No left posterior communicating artery identified, likely hypoplastic or absent. Delayed phase: No abnormal intracranial enhancement. Review of the MIP images confirms the above findings CT Brain Perfusion Findings: CBF (<30%) Volume: 56mL Perfusion (Tmax>6.0s) volume: 126mL Mismatch Volume: 172mL Infarction Location:Right MCA distribution IMPRESSION: CT brain perfusion: 1. Core infarct 28 cc, right MCA distribution. 2. Ischemic penumbra 129 cc. CTA head: 1. Right M1 proximal occlusion. Poor right MCA distribution collateralization. 2. Otherwise patent anterior and posterior circulations without aneurysm or occlusion. 3. Bilateral ICA paraclinoid and left P1/2 mild stenosis. 4. Bilateral V4 calcified plaque with severe right and moderate left stenosis. CTA neck: 1. Right proximal ICA moderate 50-70% stenosis with dense calcified plaque. 2. Left proximal ICA mild to moderate  50% stenosis with dense calcified plaque. 3. No significant stenosis of vertebral arteries. 4. No dissection or aneurysm of carotid or vertebral arteries in the neck. 5. Ground-glass opacities in lung apices, probably pulmonary edema, possibly neurogenic given stroke. Small effusions. These results were called by telephone at the time of interpretation on 11/13/2017 at 10:42 pm to Dr. Amie Portland , who verbally acknowledged these results. Electronically Signed   By: Kristine Garbe M.D.   On: 11/13/2017 22:56   Ct Head Code Stroke Wo Contrast  Result Date: 11/13/2017 CLINICAL DATA:  Code stroke.  77 y/o  F; left-sided facial droop. EXAM: CT HEAD WITHOUT CONTRAST TECHNIQUE: Contiguous axial images were obtained from the base of the skull through the vertex without intravenous contrast. COMPARISON:  None. FINDINGS: Brain: Hypoattenuation is present within the right temporal lobe, insula, putamen, and caudate body with loss of gray-white differentiation likely representing an acute infarction. Aspects is 6. No acute hemorrhage. No significant mass effect. No other stroke, focal mass effect, or brain parenchymal lesion identified. Vascular: Right dense M1 likely thrombus. Skull: Normal. Negative for fracture or focal lesion. Sinuses/Orbits: No acute finding. Other: None. ASPECTS [facility] Stroke Program Early CT Score) - Ganglionic level infarction (caudate, lentiform nuclei, internal capsule, insula, M1-M3 cortex): 3 - Supraganglionic infarction (M4-M6 cortex): 3 Total score (0-10 with 10 being normal): 6 IMPRESSION: 1. Right MCA distribution hypoattenuation with loss of gray-white differentiation compatible with acute infarct. No hemorrhage or mass effect. 2. ASPECTS is 6 3. Right M1 dense vessel, likely thrombus. These results were called by telephone at the time of interpretation on 11/13/2017 at 10:22 pm to Dr. Amie Portland , who verbally acknowledged these results. Electronically Signed   By: Kristine Garbe M.D.   On: 11/13/2017 22:27    Procedures Procedures (including critical care time)  CRITICAL CARE Performed by: Larene Pickett   Total critical care time: 30 minutes  Critical care time was exclusive of separately billable procedures and treating other patients.  Critical care was necessary to treat or prevent imminent or life-threatening deterioration.  Critical care was time spent personally by me on the following activities: development of treatment plan with patient and/or surrogate as well as nursing, discussions with consultants, evaluation of patient's response to treatment, examination of patient, obtaining history from patient or surrogate, ordering and performing treatments and interventions, ordering and review of laboratory studies, ordering and review of radiographic studies, pulse oximetry and re-evaluation of patient's condition.   Medications Ordered in ED Medications   stroke: mapping our early stages of recovery book (not administered)  acetaminophen (TYLENOL) tablet 650 mg (not administered)  Or  acetaminophen (TYLENOL) solution 650 mg (not administered)    Or  acetaminophen (TYLENOL) suppository 650 mg (not administered)  senna-docusate (Senokot-S) tablet 1 tablet (not administered)  heparin injection 5,000 Units (not administered)  0.9 %  sodium chloride infusion (not administered)  aspirin suppository 300 mg (not administered)    Or  aspirin tablet 325 mg (not administered)  potassium chloride 10 mEq in 100 mL IVPB (not administered)  potassium chloride 20 MEQ/15ML (10%) solution 40 mEq (not administered)  nitroGLYCERIN 100 MCG/ML intra-arterial injection (not administered)  eptifibatide (INTEGRILIN) 20 MG/10ML injection (not administered)  iopamidol (ISOVUE-300) 61 % injection (not administered)  ceFAZolin (ANCEF) 2-4 GM/100ML-% IVPB (not administered)  nitroGLYCERIN 1 mg/10 ml (100 mcg/ml) - IR/CATH LAB (25 mcg Intra-arterial Given  11/14/17 0032)  eptifibatide (INTEGRILIN) injection (1.5 mg Other Given 11/14/17 0025)  iopamidol (ISOVUE-370) 76 % injection (100 mLs  Contrast Given 11/13/17 2215)  fentaNYL (SUBLIMAZE) 100 MCG/2ML injection (  Override pull for Anesthesia 11/13/17 2324)     Initial Impression / Assessment and Plan / ED Course  I have reviewed the triage vital signs and the nursing notes.  Pertinent labs & imaging results that were available during my care of the patient were reviewed by me and considered in my medical decision making (see chart for details).  77 year old female presenting to the ED as a code stroke.  Last seen well 8 PM when she went to take a nap.  Upon awakening she was unable to move left side and has been called EMS.  On arrival here has rightward gaze and left-sided neglect.  CTs with right M1 proximal occlusion with poor collateralization.  Unsure about patient's last dosing of xarelto (seemed she stopped then restarted but husband not entirely sure), therefore tpa not given. Patient with severe deficits at present and very poor collateral perfusion-- patient will be taken to IR for acute intervention.  Neurology has discussed this with patient and her husband.  She will be admitted to ICU afterwards for ongoing care.  Final Clinical Impressions(s) / ED Diagnoses   Final diagnoses:  Stroke (cerebrum) [facility])    ED Discharge Orders    None       Larene Pickett, PA-C 11/14/17 0041    Isla Pence, MD 11/17/17 [phone]

## 2017-11-14 ENCOUNTER — Inpatient Hospital Stay (HOSPITAL_COMMUNITY): Payer: Medicare HMO

## 2017-11-14 ENCOUNTER — Encounter (HOSPITAL_COMMUNITY): Payer: Self-pay | Admitting: Radiology

## 2017-11-14 DIAGNOSIS — I480 Paroxysmal atrial fibrillation: Secondary | ICD-10-CM

## 2017-11-14 DIAGNOSIS — I6601 Occlusion and stenosis of right middle cerebral artery: Secondary | ICD-10-CM | POA: Diagnosis present

## 2017-11-14 DIAGNOSIS — Z5181 Encounter for therapeutic drug level monitoring: Secondary | ICD-10-CM

## 2017-11-14 DIAGNOSIS — Z7901 Long term (current) use of anticoagulants: Secondary | ICD-10-CM

## 2017-11-14 HISTORY — PX: IR CT HEAD LTD: IMG2386

## 2017-11-14 HISTORY — DX: Occlusion and stenosis of right middle cerebral artery: I66.01

## 2017-11-14 HISTORY — PX: IR PERCUTANEOUS ART THROMBECTOMY/INFUSION INTRACRANIAL INC DIAG ANGIO: IMG6087

## 2017-11-14 LAB — CBC WITH DIFFERENTIAL/PLATELET
BASOS ABS: 0 10*3/uL (ref 0.0–0.1)
Basophils Relative: 0 %
EOS PCT: 1 %
Eosinophils Absolute: 0.1 10*3/uL (ref 0.0–0.7)
HCT: 28.4 % — ABNORMAL LOW (ref 36.0–46.0)
Hemoglobin: 9 g/dL — ABNORMAL LOW (ref 12.0–15.0)
Lymphocytes Relative: 12 %
Lymphs Abs: 0.8 10*3/uL (ref 0.7–4.0)
MCH: 29 pg (ref 26.0–34.0)
MCHC: 31.7 g/dL (ref 30.0–36.0)
MCV: 91.6 fL (ref 78.0–100.0)
MONO ABS: 0.6 10*3/uL (ref 0.1–1.0)
Monocytes Relative: 9 %
Neutro Abs: 5.1 10*3/uL (ref 1.7–7.7)
Neutrophils Relative %: 78 %
PLATELETS: 280 10*3/uL (ref 150–400)
RBC: 3.1 MIL/uL — ABNORMAL LOW (ref 3.87–5.11)
RDW: 17.7 % — AB (ref 11.5–15.5)
WBC: 6.6 10*3/uL (ref 4.0–10.5)

## 2017-11-14 LAB — BASIC METABOLIC PANEL
ANION GAP: 13 (ref 5–15)
BUN: 8 mg/dL (ref 6–20)
CALCIUM: 8 mg/dL — AB (ref 8.9–10.3)
CO2: 23 mmol/L (ref 22–32)
Chloride: 105 mmol/L (ref 101–111)
Creatinine, Ser: 1.23 mg/dL — ABNORMAL HIGH (ref 0.44–1.00)
GFR calc Af Amer: 47 mL/min — ABNORMAL LOW (ref >60)
GFR, EST NON AFRICAN AMERICAN: 41 mL/min — AB (ref >60)
GLUCOSE: 100 mg/dL — AB (ref 65–99)
Potassium: 2.6 mmol/L — CL (ref 3.5–5.1)
Sodium: 141 mmol/L (ref 135–145)

## 2017-11-14 LAB — LIPID PANEL
Cholesterol: 152 mg/dL (ref 0–200)
HDL: 53 mg/dL (ref >40)
LDL CALC: 91 mg/dL (ref 0–99)
TRIGLYCERIDES: 38 mg/dL (ref <150)
Total CHOL/HDL Ratio: 2.9 RATIO
VLDL: 8 mg/dL (ref 0–40)

## 2017-11-14 LAB — HEMOGLOBIN A1C
Hgb A1c MFr Bld: 4.8 % (ref 4.8–5.6)
Mean Plasma Glucose: 91.06 mg/dL

## 2017-11-14 LAB — POTASSIUM: Potassium: 3 mmol/L — ABNORMAL LOW (ref 3.5–5.1)

## 2017-11-14 LAB — MRSA PCR SCREENING: MRSA BY PCR: NEGATIVE

## 2017-11-14 LAB — MAGNESIUM: Magnesium: 1.5 mg/dL — ABNORMAL LOW (ref 1.7–2.4)

## 2017-11-14 MED ORDER — GLYCOPYRROLATE 0.2 MG/ML IJ SOLN
INTRAMUSCULAR | Status: DC | PRN
  Administered 2017-11-14: .4 mg via INTRAVENOUS

## 2017-11-14 MED ORDER — FERROUS SULFATE 325 (65 FE) MG PO TABS
325.0000 mg | ORAL_TABLET | Freq: Every day | ORAL | Status: DC
  Administered 2017-11-14 – 2017-11-15 (×2): 325 mg via ORAL
  Filled 2017-11-14 (×3): qty 1

## 2017-11-14 MED ORDER — FUROSEMIDE 40 MG PO TABS
40.0000 mg | ORAL_TABLET | Freq: Two times a day (BID) | ORAL | Status: DC
  Administered 2017-11-14 – 2017-11-15 (×2): 40 mg via ORAL
  Filled 2017-11-14 (×2): qty 1

## 2017-11-14 MED ORDER — FUROSEMIDE 10 MG/ML IJ SOLN
40.0000 mg | Freq: Every day | INTRAMUSCULAR | Status: DC
  Administered 2017-11-14: 40 mg via INTRAVENOUS
  Filled 2017-11-14: qty 4

## 2017-11-14 MED ORDER — METOPROLOL TARTRATE 25 MG PO TABS
25.0000 mg | ORAL_TABLET | Freq: Two times a day (BID) | ORAL | Status: DC
  Administered 2017-11-14: 25 mg via ORAL
  Filled 2017-11-14: qty 1

## 2017-11-14 MED ORDER — FLUOXETINE HCL 20 MG PO CAPS
40.0000 mg | ORAL_CAPSULE | Freq: Every day | ORAL | Status: DC
  Administered 2017-11-14 – 2017-11-15 (×2): 40 mg via ORAL
  Filled 2017-11-14 (×2): qty 2

## 2017-11-14 MED ORDER — SUGAMMADEX SODIUM 500 MG/5ML IV SOLN
INTRAVENOUS | Status: DC | PRN
  Administered 2017-11-14: 400 mg via INTRAVENOUS

## 2017-11-14 MED ORDER — POTASSIUM CHLORIDE 10 MEQ/100ML IV SOLN
INTRAVENOUS | Status: AC
  Administered 2017-11-14: 10 meq via INTRAVENOUS
  Filled 2017-11-14: qty 100

## 2017-11-14 MED ORDER — NICARDIPINE HCL IN NACL 20-0.86 MG/200ML-% IV SOLN
INTRAVENOUS | Status: DC | PRN
  Administered 2017-11-14: 5 mg/h via INTRAVENOUS

## 2017-11-14 MED ORDER — POTASSIUM CHLORIDE 10 MEQ/100ML IV SOLN
10.0000 meq | INTRAVENOUS | Status: AC
  Administered 2017-11-14 (×3): 10 meq via INTRAVENOUS
  Filled 2017-11-14 (×3): qty 100

## 2017-11-14 MED ORDER — ACETAMINOPHEN 160 MG/5ML PO SOLN: 650.0000 mg | ORAL | Status: DC | PRN

## 2017-11-14 MED ORDER — SODIUM CHLORIDE 0.9 % IV SOLN: INTRAVENOUS | Status: DC

## 2017-11-14 MED ORDER — SODIUM CHLORIDE 0.9 % IV SOLN
1.0000 g | Freq: Once | INTRAVENOUS | Status: AC
  Administered 2017-11-14: 1 g via INTRAVENOUS
  Filled 2017-11-14: qty 10

## 2017-11-14 MED ORDER — CLEVIDIPINE BUTYRATE 0.5 MG/ML IV EMUL
0.0000 mg/h | INTRAVENOUS | Status: DC
  Administered 2017-11-14: 1 mg/h via INTRAVENOUS
  Filled 2017-11-14: qty 50

## 2017-11-14 MED ORDER — NICARDIPINE HCL IN NACL 20-0.86 MG/200ML-% IV SOLN
INTRAVENOUS | Status: AC
  Filled 2017-11-14: qty 200

## 2017-11-14 MED ORDER — ATORVASTATIN CALCIUM 20 MG PO TABS
20.0000 mg | ORAL_TABLET | Freq: Every day | ORAL | Status: DC
Start: 2017-11-14 — End: 2017-11-15
  Administered 2017-11-14: 20 mg via ORAL
  Filled 2017-11-14: qty 1

## 2017-11-14 MED ORDER — MAGNESIUM SULFATE 2 GM/50ML IV SOLN
INTRAVENOUS | Status: AC
  Administered 2017-11-14: 2 g via INTRAVENOUS
  Filled 2017-11-14: qty 50

## 2017-11-14 MED ORDER — ACETAMINOPHEN 650 MG RE SUPP: 650.0000 mg | RECTAL | Status: DC | PRN

## 2017-11-14 MED ORDER — LEVOTHYROXINE SODIUM 75 MCG PO TABS
225.0000 ug | ORAL_TABLET | Freq: Every day | ORAL | Status: DC
  Administered 2017-11-15: 225 ug via ORAL
  Filled 2017-11-14: qty 3

## 2017-11-14 MED ORDER — NICARDIPINE HCL IN NACL 20-0.86 MG/200ML-% IV SOLN
0.0000 mg/h | INTRAVENOUS | Status: DC
  Administered 2017-11-14: 1 mg/h via INTRAVENOUS
  Filled 2017-11-14: qty 200

## 2017-11-14 MED ORDER — SENNOSIDES-DOCUSATE SODIUM 8.6-50 MG PO TABS
2.0000 | ORAL_TABLET | Freq: Every day | ORAL | Status: DC
  Administered 2017-11-14: 2 via ORAL
  Filled 2017-11-14: qty 2

## 2017-11-14 MED ORDER — MAGNESIUM SULFATE 2 GM/50ML IV SOLN
2.0000 g | Freq: Once | INTRAVENOUS | Status: AC
  Administered 2017-11-14: 2 g via INTRAVENOUS

## 2017-11-14 MED ORDER — METOPROLOL SUCCINATE ER 50 MG PO TB24: 100.0000 mg | ORAL_TABLET | Freq: Every day | ORAL | Status: DC

## 2017-11-14 MED ORDER — AMIODARONE HCL 200 MG PO TABS
400.0000 mg | ORAL_TABLET | Freq: Every day | ORAL | Status: DC
  Administered 2017-11-14: 400 mg via ORAL
  Filled 2017-11-14: qty 2

## 2017-11-14 MED ORDER — LEVOTHYROXINE SODIUM 75 MCG PO TABS: 175.0000 ug | ORAL_TABLET | Freq: Every day | ORAL | Status: DC

## 2017-11-14 MED ORDER — POLYETHYLENE GLYCOL 3350 17 G PO PACK
17.0000 g | PACK | Freq: Every day | ORAL | Status: DC
  Filled 2017-11-14: qty 1

## 2017-11-14 MED ORDER — EPTIFIBATIDE 20 MG/10ML IV SOLN
INTRAVENOUS | Status: AC | PRN
  Administered 2017-11-14: 1 mg
  Administered 2017-11-14: 1.5 mg

## 2017-11-14 MED ORDER — POTASSIUM CHLORIDE CRYS ER 20 MEQ PO TBCR
20.0000 meq | EXTENDED_RELEASE_TABLET | Freq: Two times a day (BID) | ORAL | Status: DC
  Administered 2017-11-14 – 2017-11-15 (×2): 20 meq via ORAL
  Filled 2017-11-14 (×2): qty 1

## 2017-11-14 MED ORDER — POTASSIUM CHLORIDE 10 MEQ/100ML IV SOLN
10.0000 meq | INTRAVENOUS | Status: AC
  Administered 2017-11-14 (×2): 10 meq via INTRAVENOUS
  Filled 2017-11-14 (×2): qty 100

## 2017-11-14 MED ORDER — ACETAMINOPHEN 325 MG PO TABS: 650.0000 mg | ORAL_TABLET | ORAL | Status: DC | PRN

## 2017-11-14 NOTE — Progress Notes (Signed)
Patient ID: Teresa York, female   DOB: 11-Dec-1940, 77 y.o.   MRN: [id number] INR. 42 y old RT F LSW at 51 00pm. MRS 1. Found with dysarthria ,Lt hemiplegia and RT Gaze deviation. NIH 22. CT brain ASPECTS 6 No ICH  CTP  CBF< 30 % vol 40ml. Tmax > 6.0s vol 140ml. Mismatch 129 ml .Mismatch ratio 5.6 CTA RT MCA M  Occlusion. Findings reviewed with patients husband.Option of endovascular treatment to prevent further neurological injury explained to spouse. Alternatives reviewed. Risks of ich of  10 % ,WORSEING NEURO deficit ,VENT DEPENDENCY,DEATH AND INABILITY TO REVASCULARISE WERE discussed fully. Questions were answered with spouse expressing understanding. Informed witnessed consent was obtained for endovascular treatment. S.Algie Cales MD

## 2017-11-14 NOTE — Consult Note (Signed)
CARDIOLOGY CONSULT NOTE       Patient ID: Teresa York MRN: [id number] DOB/AGE: [birth year] 77 y.o.  Admit date: 11/13/2017 Referring Physician: Leonie Man Primary Physician: Maylon Peppers, MD Primary Cardiologist: Julianne Handler Reason for Consultation: AFib , Stroke , anticoagulation   Active Problems:   Stroke (cerebrum) [facility])   Middle cerebral artery embolism, right   HPI:  77 y.o. with history of PAF, HTN, HLD, OSA and DCM with EF 30-35%. Barrett's esophagitis. Has had Multiple Rx;s at [facility] most recently 3 weeks ago and was off xarelto due to bleeding post procedure. Admitted With large MCA stroke Had IR thrombectomy with some improvement in extremity weakness but still with Aphasia. CT/MRI with no bleeding. Renal function is normal. She has had increasing dyspnea over the last 3 weeks. CXR today with CHF. Volume status seems high No chest pain or palpitations she is in afib now And tends not to notice it. Reviewed her CT head today and no acute intracranial hemorrhage post catheter directed revascularization PT/OT seeing her now  Previously on Toprol for rate control and lasix 40 mg daily She is NPO and getting nicardipine  to prevent spasm    ROS All other systems reviewed and negative except as noted above  Past Medical History:  Diagnosis Date  . Allergic rhinitis   . Anxiety   . Atrial fibrillation ([place])   . Barrett's esophagus   . Breast cancer ([place]) 2005   "left"  . Chronic lower back pain   . Chronic systolic CHF (congestive heart failure) (HCC)    a. EF 30-35% felt to be possibly be tachy mediated from afib wtih RVR  . Depression   . Diverticulosis   . GERD (gastroesophageal reflux disease)   . Hiatal hernia   . Hypertension   . Hypothyroidism   . Left atrial enlargement   . Mitral regurgitation   . OAB (overactive bladder)   . Persistent atrial fibrillation ([place]) 10/31/2016   a. s/p failed DCCV x3. Now on amiodarone.   . Squamous carcinoma    "face, corner of  right eye" (10/31/2016)    Family History  Problem Relation Age of Onset  . Heart attack Father 95  . Colon cancer Neg Hx   . Esophageal cancer Neg Hx   . Stomach cancer Neg Hx     Social History   Socioeconomic History  . Marital status: Married    Spouse name: Not on file  . Number of children: 2  . Years of education: Not on file  . Highest education level: Not on file  Social Needs  . Financial resource strain: Not on file  . Food insecurity - worry: Not on file  . Food insecurity - inability: Not on file  . Transportation needs - medical: Not on file  . Transportation needs - non-medical: Not on file  Occupational History  . Occupation: Retired    Fish farm manager: RETIRED  Tobacco Use  . Smoking status: Never Smoker  . Smokeless tobacco: Never Used  Substance and Sexual Activity  . Alcohol use: Yes    Alcohol/week: 0.0 oz    Comment: 1-2 per week  . Drug use: No  . Sexual activity: Not Currently  Other Topics Concern  . Not on file  Social History Narrative  . Not on file    Past Surgical History:  Procedure Laterality Date  . BACK SURGERY    . BREAST BIOPSY Left 2005  . BREAST LUMPECTOMY Left 2005  . CARDIOVERSION N/A 11/02/2016  Procedure: CARDIOVERSION;  Surgeon: Thayer Headings, MD;  Location: [facility] ENDOSCOPY;  Service: Cardiovascular;  Laterality: N/A;  . CARDIOVERSION N/A 11/16/2016   Procedure: CARDIOVERSION;  Surgeon: Fay Records, MD;  Location[facility] ENDOSCOPY;  Service: Cardiovascular;  Laterality: N/A;  . CARDIOVERSION N/A 12/24/2016   Procedure: CARDIOVERSION;  Surgeon: Sanda Klein, MD;  Location: [facility] ENDOSCOPY;  Service: Cardiovascular;  Laterality: N/A;  . CARPAL TUNNEL RELEASE Bilateral 1995  . COLONOSCOPY    . ELBOW FRACTURE SURGERY Right 2009   with implant  . ESOPHAGOGASTRODUODENOSCOPY    . ESOPHAGUS SURGERY     "in the process of getting cancerous cell off my esophagus" (10/31/2016)  . FRACTURE SURGERY    . LUMBAR DISC SURGERY     L5  . MOHS  SURGERY     "corner of right eye; on left cheek"  . TEE WITHOUT CARDIOVERSION N/A 11/16/2016   Procedure: TRANSESOPHAGEAL ECHOCARDIOGRAM (TEE);  Surgeon: Fay Records, MD;  Location: [place];  Service: Cardiovascular;  Laterality: N/A;  . TONSILLECTOMY    . TUBAL LIGATION  1980     .  stroke: mapping our early stages of recovery book   Does not apply Once  . aspirin  300 mg Rectal Daily   Or  . aspirin  325 mg Oral Daily  . atorvastatin  20 mg Oral q1800  . heparin  5,000 Units Subcutaneous Q8H  . potassium chloride  40 mEq Oral Once   . sodium chloride 75 mL/hr at 11/14/17 0337  . sodium chloride    . clevidipine Stopped (11/14/17 0500)  . niCARDipine Stopped (11/14/17 0945)    Physical Exam: Blood pressure (!) 114/57, pulse (!) 39, temperature 98.3 F (36.8 C), temperature source Oral, resp. rate (!) 29, height 5\' 2"  (1.575 m), weight 208 lb 8.9 oz (94.6 kg), SpO2 90 %.    Intake/Output Summary (Last 24 hours) at 11/14/2017 1157 Last data filed at 11/14/2017 1000 Gross per 24 hour  Intake 1834.79 ml  Output 700 ml  Net 1134.79 ml   Affect appropriate Pale elderly female  HEENT: normal Neck supple with no adenopathy JVP normal no bruits no thyromegaly Lungs clear with no wheezing and good diaphragmatic motion Heart:  S1/S2 SEM AR/MR  murmur, no rub, gallop or click PMI normal Abdomen: benighn, BS positve, no tenderness, no AAA no bruit.  No HSM or HJR Distal pulses intact with no bruits No edema Aphasia left leg > left arm weakness  Skin warm and dry No muscular weakness   Labs:   Lab Results  Component Value Date   WBC 6.6 11/14/2017   HGB 9.0 (L) 11/14/2017   HCT 28.4 (L) 11/14/2017   MCV 91.6 11/14/2017   PLT 280 11/14/2017    Recent Labs  Lab 11/13/17 2207  11/14/17 0500  NA 142   < > 141  K 2.8*   < > 2.6*  CL 106   < > 105  CO2 24  --  23  BUN 8   < > 8  CREATININE 1.32*   < > 1.23*  CALCIUM 8.9  --  8.0*  PROT 6.4*  --   --   BILITOT 0.8   --   --   ALKPHOS 73  --   --   ALT 29  --   --   AST 39  --   --   GLUCOSE 102*   < > 100*   < > = values in this interval not  displayed.   Lab Results  Component Value Date   TROPONINI <0.03 11/01/2016    Lab Results  Component Value Date   CHOL 152 11/14/2017   CHOL 118 11/16/2016   Lab Results  Component Value Date   HDL 53 11/14/2017   HDL 37 (L) 11/16/2016   Lab Results  Component Value Date   LDLCALC 91 11/14/2017   LDLCALC 66 11/16/2016   Lab Results  Component Value Date   TRIG 38 11/14/2017   TRIG 77 11/16/2016   Lab Results  Component Value Date   CHOLHDL 2.9 11/14/2017   CHOLHDL 3.2 11/16/2016   No results found for: LDLDIRECT    Radiology: Ct Angio Head W Or Wo Contrast  Result Date: 11/13/2017 CLINICAL DATA:  77 y/o  F; left facial droop and dysphagia. EXAM: CT ANGIOGRAPHY HEAD AND NECK CT PERFUSION BRAIN TECHNIQUE: Multidetector CT imaging of the head and neck was performed using the standard protocol during bolus administration of intravenous contrast. Multiplanar CT image reconstructions and MIPs were obtained to evaluate the vascular anatomy. Carotid stenosis measurements (when applicable) are obtained utilizing NASCET criteria, using the distal internal carotid diameter as the denominator. Multiphase CT imaging of the brain was performed following IV bolus contrast injection. Subsequent parametric perfusion maps were calculated using RAPID software. CONTRAST:  12mL ISOVUE-370 IOPAMIDOL (ISOVUE-370) INJECTION 76% COMPARISON:  None. FINDINGS: CTA NECK FINDINGS Aortic arch: Bovine variant branching. Imaged portion shows no evidence of aneurysm or dissection. No significant stenosis of the major arch vessel origins. Moderate calcific atherosclerosis. Right carotid system: Dense calcified plaque of the right carotid bifurcation with moderate 50-70% proximal ICA stenosis. Left carotid system: Dense calcified plaque of the left carotid bifurcation with mild to  moderate 50% proximal ICA stenosis. Severe stenosis of the left external carotid artery origin. Vertebral arteries: Left dominant. No evidence of dissection, stenosis (50% or greater) or occlusion. Skeleton: Mild cervical spondylosis. No high-grade bony canal stenosis. Other neck: Patulous esophagus. Upper chest: Ground-glass opacities within the lung apices probably representing pulmonary edema, possibly neurogenic given stroke. Small bilateral effusions. Review of the MIP images confirms the above findings CTA HEAD FINDINGS Anterior circulation: Right proximal M1 occlusion. Poor right MCA distribution collateralization. Calcific atherosclerosis of carotid siphons with mild less than 50% bilateral paraclinoid stenosis. Patent bilateral anterior cerebral arteries and left middle cerebral artery. Posterior circulation: Bilateral V4 short segments of dense calcified plaque with severe greater than 70% right and moderate 50-70% left stenosis. Patent basilar and posterior cerebral arteries. Left P1 2 junction mild stenosis. Venous sinuses: As permitted by contrast timing, patent. Anatomic variants: Anterior communicating artery and right posterior communicating artery identified. No left posterior communicating artery identified, likely hypoplastic or absent. Delayed phase: No abnormal intracranial enhancement. Review of the MIP images confirms the above findings CT Brain Perfusion Findings: CBF (<30%) Volume: 36mL Perfusion (Tmax>6.0s) volume: 142mL Mismatch Volume: 167mL Infarction Location:Right MCA distribution IMPRESSION: CT brain perfusion: 1. Core infarct 28 cc, right MCA distribution. 2. Ischemic penumbra 129 cc. CTA head: 1. Right M1 proximal occlusion. Poor right MCA distribution collateralization. 2. Otherwise patent anterior and posterior circulations without aneurysm or occlusion. 3. Bilateral ICA paraclinoid and left P1/2 mild stenosis. 4. Bilateral V4 calcified plaque with severe right and moderate left  stenosis. CTA neck: 1. Right proximal ICA moderate 50-70% stenosis with dense calcified plaque. 2. Left proximal ICA mild to moderate 50% stenosis with dense calcified plaque. 3. No significant stenosis of vertebral arteries. 4. No dissection or aneurysm of  carotid or vertebral arteries in the neck. 5. Ground-glass opacities in lung apices, probably pulmonary edema, possibly neurogenic given stroke. Small effusions. These results were called by telephone at the time of interpretation on 11/13/2017 at 10:42 pm to Dr. Amie Portland , who verbally acknowledged these results. Electronically Signed   By: Kristine Garbe M.D.   On: 11/13/2017 22:56   Dg Chest 2 View  Result Date: 11/03/2017 CLINICAL DATA:  Patient with history of vomiting. EXAM: CHEST  2 VIEW COMPARISON:  Chest radiograph 10/29/2016 FINDINGS: Monitoring leads overlie the patient. Stable cardiomegaly. Bilateral interstitial pulmonary opacities. No pleural effusion or pneumothorax. Thoracic spine degenerative changes. IMPRESSION: Cardiomegaly, pulmonary vascular redistribution and mild interstitial edema. Electronically Signed   By: Lovey Newcomer M.D.   On: 11/03/2017 17:44   Ct Head Wo Contrast  Result Date: 11/14/2017 CLINICAL DATA:  Follow-up examination status post arteriogram. EXAM: CT HEAD WITHOUT CONTRAST TECHNIQUE: Contiguous axial images were obtained from the base of the skull through the vertex without intravenous contrast. COMPARISON:  Prior studies from 11/13/2017. FINDINGS: Brain: Atrophy with chronic microvascular ischemic disease. No acute intracranial hemorrhage. Subtle asymmetric hypodensity within the anterior right temporal lobe suspicious for evolving ischemia (series 3, image 12). No significant mass effect. No other acute or evolving large vessel territory infarct. No mass lesion or midline shift. No hydrocephalus. No extra-axial fluid collection. Vascular: Diffuse hyperdensity throughout the intracranial circulation,  likely reflecting contrast material. Intracranial atherosclerosis again noted. Skull: And calvarium within normal limits.  Scalp soft tissues Sinuses/Orbits: Globes and orbital soft tissues within normal limits. Paranasal sinuses and mastoid air cells are clear. Other: None. IMPRESSION: 1. No acute intracranial hemorrhage or other complication identified status post catheter directed revascularization. 2. Hypodensity within the anterior right temporal lobe, consistent with evolving right MCA territory infarct. No associated mass effect. 3. Otherwise stable appearance of the brain. No other acute intracranial abnormality. Electronically Signed   By: Jeannine Boga M.D.   On: 11/14/2017 05:07   Ct Angio Neck W Or Wo Contrast  Result Date: 11/13/2017 CLINICAL DATA:  77 y/o  F; left facial droop and dysphagia. EXAM: CT ANGIOGRAPHY HEAD AND NECK CT PERFUSION BRAIN TECHNIQUE: Multidetector CT imaging of the head and neck was performed using the standard protocol during bolus administration of intravenous contrast. Multiplanar CT image reconstructions and MIPs were obtained to evaluate the vascular anatomy. Carotid stenosis measurements (when applicable) are obtained utilizing NASCET criteria, using the distal internal carotid diameter as the denominator. Multiphase CT imaging of the brain was performed following IV bolus contrast injection. Subsequent parametric perfusion maps were calculated using RAPID software. CONTRAST:  130mL ISOVUE-370 IOPAMIDOL (ISOVUE-370) INJECTION 76% COMPARISON:  None. FINDINGS: CTA NECK FINDINGS Aortic arch: Bovine variant branching. Imaged portion shows no evidence of aneurysm or dissection. No significant stenosis of the major arch vessel origins. Moderate calcific atherosclerosis. Right carotid system: Dense calcified plaque of the right carotid bifurcation with moderate 50-70% proximal ICA stenosis. Left carotid system: Dense calcified plaque of the left carotid bifurcation with  mild to moderate 50% proximal ICA stenosis. Severe stenosis of the left external carotid artery origin. Vertebral arteries: Left dominant. No evidence of dissection, stenosis (50% or greater) or occlusion. Skeleton: Mild cervical spondylosis. No high-grade bony canal stenosis. Other neck: Patulous esophagus. Upper chest: Ground-glass opacities within the lung apices probably representing pulmonary edema, possibly neurogenic given stroke. Small bilateral effusions. Review of the MIP images confirms the above findings CTA HEAD FINDINGS Anterior circulation: Right proximal M1 occlusion.  Poor right MCA distribution collateralization. Calcific atherosclerosis of carotid siphons with mild less than 50% bilateral paraclinoid stenosis. Patent bilateral anterior cerebral arteries and left middle cerebral artery. Posterior circulation: Bilateral V4 short segments of dense calcified plaque with severe greater than 70% right and moderate 50-70% left stenosis. Patent basilar and posterior cerebral arteries. Left P1 2 junction mild stenosis. Venous sinuses: As permitted by contrast timing, patent. Anatomic variants: Anterior communicating artery and right posterior communicating artery identified. No left posterior communicating artery identified, likely hypoplastic or absent. Delayed phase: No abnormal intracranial enhancement. Review of the MIP images confirms the above findings CT Brain Perfusion Findings: CBF (<30%) Volume: 70mL Perfusion (Tmax>6.0s) volume: 170mL Mismatch Volume: 164mL Infarction Location:Right MCA distribution IMPRESSION: CT brain perfusion: 1. Core infarct 28 cc, right MCA distribution. 2. Ischemic penumbra 129 cc. CTA head: 1. Right M1 proximal occlusion. Poor right MCA distribution collateralization. 2. Otherwise patent anterior and posterior circulations without aneurysm or occlusion. 3. Bilateral ICA paraclinoid and left P1/2 mild stenosis. 4. Bilateral V4 calcified plaque with severe right and  moderate left stenosis. CTA neck: 1. Right proximal ICA moderate 50-70% stenosis with dense calcified plaque. 2. Left proximal ICA mild to moderate 50% stenosis with dense calcified plaque. 3. No significant stenosis of vertebral arteries. 4. No dissection or aneurysm of carotid or vertebral arteries in the neck. 5. Ground-glass opacities in lung apices, probably pulmonary edema, possibly neurogenic given stroke. Small effusions. These results were called by telephone at the time of interpretation on 11/13/2017 at 10:42 pm to Dr. Amie Portland , who verbally acknowledged these results. Electronically Signed   By: Kristine Garbe M.D.   On: 11/13/2017 22:56   Ct Cerebral Perfusion W Contrast  Result Date: 11/13/2017 CLINICAL DATA:  77 y/o  F; left facial droop and dysphagia. EXAM: CT ANGIOGRAPHY HEAD AND NECK CT PERFUSION BRAIN TECHNIQUE: Multidetector CT imaging of the head and neck was performed using the standard protocol during bolus administration of intravenous contrast. Multiplanar CT image reconstructions and MIPs were obtained to evaluate the vascular anatomy. Carotid stenosis measurements (when applicable) are obtained utilizing NASCET criteria, using the distal internal carotid diameter as the denominator. Multiphase CT imaging of the brain was performed following IV bolus contrast injection. Subsequent parametric perfusion maps were calculated using RAPID software. CONTRAST:  153mL ISOVUE-370 IOPAMIDOL (ISOVUE-370) INJECTION 76% COMPARISON:  None. FINDINGS: CTA NECK FINDINGS Aortic arch: Bovine variant branching. Imaged portion shows no evidence of aneurysm or dissection. No significant stenosis of the major arch vessel origins. Moderate calcific atherosclerosis. Right carotid system: Dense calcified plaque of the right carotid bifurcation with moderate 50-70% proximal ICA stenosis. Left carotid system: Dense calcified plaque of the left carotid bifurcation with mild to moderate 50% proximal ICA  stenosis. Severe stenosis of the left external carotid artery origin. Vertebral arteries: Left dominant. No evidence of dissection, stenosis (50% or greater) or occlusion. Skeleton: Mild cervical spondylosis. No high-grade bony canal stenosis. Other neck: Patulous esophagus. Upper chest: Ground-glass opacities within the lung apices probably representing pulmonary edema, possibly neurogenic given stroke. Small bilateral effusions. Review of the MIP images confirms the above findings CTA HEAD FINDINGS Anterior circulation: Right proximal M1 occlusion. Poor right MCA distribution collateralization. Calcific atherosclerosis of carotid siphons with mild less than 50% bilateral paraclinoid stenosis. Patent bilateral anterior cerebral arteries and left middle cerebral artery. Posterior circulation: Bilateral V4 short segments of dense calcified plaque with severe greater than 70% right and moderate 50-70% left stenosis. Patent basilar and posterior cerebral arteries.  Left P1 2 junction mild stenosis. Venous sinuses: As permitted by contrast timing, patent. Anatomic variants: Anterior communicating artery and right posterior communicating artery identified. No left posterior communicating artery identified, likely hypoplastic or absent. Delayed phase: No abnormal intracranial enhancement. Review of the MIP images confirms the above findings CT Brain Perfusion Findings: CBF (<30%) Volume: 78mL Perfusion (Tmax>6.0s) volume: 151mL Mismatch Volume: 168mL Infarction Location:Right MCA distribution IMPRESSION: CT brain perfusion: 1. Core infarct 28 cc, right MCA distribution. 2. Ischemic penumbra 129 cc. CTA head: 1. Right M1 proximal occlusion. Poor right MCA distribution collateralization. 2. Otherwise patent anterior and posterior circulations without aneurysm or occlusion. 3. Bilateral ICA paraclinoid and left P1/2 mild stenosis. 4. Bilateral V4 calcified plaque with severe right and moderate left stenosis. CTA neck: 1.  Right proximal ICA moderate 50-70% stenosis with dense calcified plaque. 2. Left proximal ICA mild to moderate 50% stenosis with dense calcified plaque. 3. No significant stenosis of vertebral arteries. 4. No dissection or aneurysm of carotid or vertebral arteries in the neck. 5. Ground-glass opacities in lung apices, probably pulmonary edema, possibly neurogenic given stroke. Small effusions. These results were called by telephone at the time of interpretation on 11/13/2017 at 10:42 pm to Dr. Amie Portland , who verbally acknowledged these results. Electronically Signed   By: Kristine Garbe M.D.   On: 11/13/2017 22:56   Dg Chest Port 1 View  Result Date: 11/14/2017 CLINICAL DATA:  Pneumonia.  Hypertension and atrial fibrillation. EXAM: PORTABLE CHEST 1 VIEW COMPARISON:  11/03/2017 FINDINGS: The heart is enlarged but appears stable. Stable tortuosity and calcification of the thoracic aorta. Vascular congestion and diffuse interstitial edema and probable small right pleural effusion. Findings suggest CHF. IMPRESSION: CHF. Electronically Signed   By: Marijo Sanes M.D.   On: 11/14/2017 08:43   Ct Head Code Stroke Wo Contrast  Result Date: 11/13/2017 CLINICAL DATA:  Code stroke.  77 y/o  F; left-sided facial droop. EXAM: CT HEAD WITHOUT CONTRAST TECHNIQUE: Contiguous axial images were obtained from the base of the skull through the vertex without intravenous contrast. COMPARISON:  None. FINDINGS: Brain: Hypoattenuation is present within the right temporal lobe, insula, putamen, and caudate body with loss of gray-white differentiation likely representing an acute infarction. Aspects is 6. No acute hemorrhage. No significant mass effect. No other stroke, focal mass effect, or brain parenchymal lesion identified. Vascular: Right dense M1 likely thrombus. Skull: Normal. Negative for fracture or focal lesion. Sinuses/Orbits: No acute finding. Other: None. ASPECTS [facility] [place] Stroke Program Early CT Score) -  Ganglionic level infarction (caudate, lentiform nuclei, internal capsule, insula, M1-M3 cortex): 3 - Supraganglionic infarction (M4-M6 cortex): 3 Total score (0-10 with 10 being normal): 6 IMPRESSION: 1. Right MCA distribution hypoattenuation with loss of gray-white differentiation compatible with acute infarct. No hemorrhage or mass effect. 2. ASPECTS is 6 3. Right M1 dense vessel, likely thrombus. These results were called by telephone at the time of interpretation on 11/13/2017 at 10:22 pm to Dr. Amie Portland , who verbally acknowledged these results. Electronically Signed   By: Kristine Garbe M.D.   On: 11/13/2017 22:27    EKG: Afib nonspecific ST changes  Telemetry afib rates in 80's    ASSESSMENT AND PLAN:  AFib:  Rate control currently ok agree with Dr Leonie Man should start eliquis at usual 5 bid dose given ongoing risk of embolic stroke and low intracranial bleeding risk of eliquis compared to xarelto Will let primary service stroke team initiate  CHF:  Last echo/TEE 11/16/16 EF moderately  decreased with EF 30% moderate AR and moderate to severe MR  She has received fluids for her stoke and interventions and is over a liter positive today. Will start iv lasix once daily and Try to diurese some while maintaining BP for neuro perfusion    Signed: Jenkins Rouge 11/14/2017, 11:46 AM

## 2017-11-14 NOTE — Sedation Documentation (Signed)
Pt taken to PACU for recovery. Bed assignment [id number]. Groin and pulses assess, as per flowsheet.

## 2017-11-14 NOTE — Procedures (Signed)
S/P RT common carotid arteriogra,m. RT CFA approacg. Findings. 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

## 2017-11-14 NOTE — Progress Notes (Signed)
Referring Physician(s): Dr. Rory Percy  Supervising Physician: Luanne Bras  Patient Status:  [facility] - In-pt  Chief Complaint:  Stroke  Subjective: Patient sitting up in chair.  Conversant and doing well.   Allergies: Benazepril and Diltiazem hcl  Medications: Prior to Admission medications   Medication Sig Start Date End Date Taking? Authorizing Provider  amiodarone (PACERONE) 400 MG tablet Take 0.5 tablets (200 mg total) by mouth daily. Patient taking differently: Take 400 mg by mouth daily.  01/30/17   Allred, Jeneen Rinks, MD  cetirizine (ZYRTEC) 10 MG tablet Take 10 mg by mouth daily. Reported on 10/27/2015    [provider]  Cholecalciferol (D 1000) 1000 units capsule Take 1,000 Units by mouth daily. Reported on 11/03/2015 11/30/14   [provider]  ferrous sulfate (SM IRON) 325 (65 FE) MG tablet Take 325 mg by mouth daily.  11/30/14   [provider]  FLUoxetine (PROZAC) 40 MG capsule Take 40 mg by mouth daily.  08/24/11   [provider]  furosemide (LASIX) 40 MG tablet Take 1 tablet (40 mg total) by mouth 2 (two) times daily. Patient taking differently: Take 40 mg by mouth daily.  05/13/17   Burnell Blanks, MD  levothyroxine (SYNTHROID, LEVOTHROID) 175 MCG tablet Take 1 tablet (175 mcg total) by mouth daily before breakfast. 11/20/16   Delos Haring, PA-C  metoprolol succinate (TOPROL-XL) 100 MG 24 hr tablet TAKE ONE TABLET BY MOUTH ONCE DAILY Patient taking differently: Take 100 mg by mouth daily.  12/17/16   Imogene Burn, PA-C  Multiple Vitamins-Minerals (OCUVITE PRESERVISION PO) Take 1 tablet by mouth daily.    [provider]  omeprazole (PRILOSEC) 40 MG capsule Take 40 mg by mouth 2 (two) times daily.    [provider]  potassium chloride SA (K-DUR,KLOR-CON) 20 MEQ tablet Take 1 tablet (20 mEq total) by mouth 2 (two) times daily. Patient taking differently: Take 20 mEq by mouth daily.  11/20/16   Delos Haring, PA-C  Rivaroxaban (XARELTO) 20 MG TABS tablet Take 1 tablet (20 mg total) by mouth daily with supper. TAKE ONE TABLET BY MOUTH ONCE DAILY WITH SUPPER Patient taking differently: Take 20 mg by mouth daily with supper.  01/30/17   Allred, Jeneen Rinks, MD  traZODone (DESYREL) 150 MG tablet Take 75 mg by mouth at bedtime as needed for sleep.  11/06/13   [provider]  zolpidem (AMBIEN) 10 MG tablet Take 10 mg by mouth at bedtime as needed for sleep.    [provider]     Vital Signs: BP 107/60   Pulse 81   Temp 98.3 F (36.8 C) (Oral)   Resp 20   Ht 5\' 2"  (1.575 m)   Wt 208 lb 8.9 oz (94.6 kg)   LMP  (LMP Unknown)   SpO2 94%   BMI 38.15 kg/m   Physical Exam  Constitutional: She is oriented to person, place, and time. She appears well-developed.  Cardiovascular: Normal rate.  Pulmonary/Chest: Effort normal. No respiratory distress.  Abdominal: Soft.  Neurological: She is alert and oriented to person, place, and time.  Conversant, tongue midline. Ocular movements intact. Able to move all extremities, strength intact in upper and lower extremities with R >L.    Skin: Skin is warm and dry.  Groin soft.  No erythema or warmth.  No evidence of hematoma or pseudoaneurysm.   Nursing note and vitals reviewed.   Imaging: Ct Angio Head W Or Wo Contrast  Result Date: 11/13/2017  CLINICAL DATA:  77 y/o  F; left facial droop and dysphagia. EXAM: CT ANGIOGRAPHY HEAD AND NECK CT PERFUSION BRAIN TECHNIQUE: Multidetector CT imaging of the head and neck was performed using the standard protocol during bolus administration of intravenous contrast. Multiplanar CT image reconstructions and MIPs were obtained to evaluate the vascular anatomy. Carotid stenosis measurements (when applicable) are obtained utilizing NASCET criteria, using the distal internal carotid diameter as the denominator. Multiphase CT imaging of the brain was performed following IV bolus contrast injection. Subsequent  parametric perfusion maps were calculated using RAPID software. CONTRAST:  181mL ISOVUE-370 IOPAMIDOL (ISOVUE-370) INJECTION 76% COMPARISON:  None. FINDINGS: CTA NECK FINDINGS Aortic arch: Bovine variant branching. Imaged portion shows no evidence of aneurysm or dissection. No significant stenosis of the major arch vessel origins. Moderate calcific atherosclerosis. Right carotid system: Dense calcified plaque of the right carotid bifurcation with moderate 50-70% proximal ICA stenosis. Left carotid system: Dense calcified plaque of the left carotid bifurcation with mild to moderate 50% proximal ICA stenosis. Severe stenosis of the left external carotid artery origin. Vertebral arteries: Left dominant. No evidence of dissection, stenosis (50% or greater) or occlusion. Skeleton: Mild cervical spondylosis. No high-grade bony canal stenosis. Other neck: Patulous esophagus. Upper chest: Ground-glass opacities within the lung apices probably representing pulmonary edema, possibly neurogenic given stroke. Small bilateral effusions. Review of the MIP images confirms the above findings CTA HEAD FINDINGS Anterior circulation: Right proximal M1 occlusion. Poor right MCA distribution collateralization. Calcific atherosclerosis of carotid siphons with mild less than 50% bilateral paraclinoid stenosis. Patent bilateral anterior cerebral arteries and left middle cerebral artery. Posterior circulation: Bilateral V4 short segments of dense calcified plaque with severe greater than 70% right and moderate 50-70% left stenosis. Patent basilar and posterior cerebral arteries. Left P1 2 junction mild stenosis. Venous sinuses: As permitted by contrast timing, patent. Anatomic variants: Anterior communicating artery and right posterior communicating artery identified. No left posterior communicating artery identified, likely hypoplastic or absent. Delayed phase: No abnormal intracranial enhancement. Review of the MIP images confirms the  above findings CT Brain Perfusion Findings: CBF (<30%) Volume: 25mL Perfusion (Tmax>6.0s) volume: 167mL Mismatch Volume: 155mL Infarction Location:Right MCA distribution IMPRESSION: CT brain perfusion: 1. Core infarct 28 cc, right MCA distribution. 2. Ischemic penumbra 129 cc. CTA head: 1. Right M1 proximal occlusion. Poor right MCA distribution collateralization. 2. Otherwise patent anterior and posterior circulations without aneurysm or occlusion. 3. Bilateral ICA paraclinoid and left P1/2 mild stenosis. 4. Bilateral V4 calcified plaque with severe right and moderate left stenosis. CTA neck: 1. Right proximal ICA moderate 50-70% stenosis with dense calcified plaque. 2. Left proximal ICA mild to moderate 50% stenosis with dense calcified plaque. 3. No significant stenosis of vertebral arteries. 4. No dissection or aneurysm of carotid or vertebral arteries in the neck. 5. Ground-glass opacities in lung apices, probably pulmonary edema, possibly neurogenic given stroke. Small effusions. These results were called by telephone at the time of interpretation on 11/13/2017 at 10:42 pm to Dr. Amie Portland , who verbally acknowledged these results. Electronically Signed   By: Kristine Garbe M.D.   On: 11/13/2017 22:56   Ct Head Wo Contrast  Result Date: 11/14/2017 CLINICAL DATA:  Follow-up examination status post arteriogram. EXAM: CT HEAD WITHOUT CONTRAST TECHNIQUE: Contiguous axial images were obtained from the base of the skull through the vertex without intravenous contrast. COMPARISON:  Prior studies from 11/13/2017. FINDINGS: Brain: Atrophy with chronic microvascular ischemic disease. No acute intracranial hemorrhage. Subtle asymmetric hypodensity within the anterior right  temporal lobe suspicious for evolving ischemia (series 3, image 12). No significant mass effect. No other acute or evolving large vessel territory infarct. No mass lesion or midline shift. No hydrocephalus. No extra-axial fluid  collection. Vascular: Diffuse hyperdensity throughout the intracranial circulation, likely reflecting contrast material. Intracranial atherosclerosis again noted. Skull: And calvarium within normal limits.  Scalp soft tissues Sinuses/Orbits: Globes and orbital soft tissues within normal limits. Paranasal sinuses and mastoid air cells are clear. Other: None. IMPRESSION: 1. No acute intracranial hemorrhage or other complication identified status post catheter directed revascularization. 2. Hypodensity within the anterior right temporal lobe, consistent with evolving right MCA territory infarct. No associated mass effect. 3. Otherwise stable appearance of the brain. No other acute intracranial abnormality. Electronically Signed   By: Jeannine Boga M.D.   On: 11/14/2017 05:07   Ct Angio Neck W Or Wo Contrast  Result Date: 11/13/2017 CLINICAL DATA:  77 y/o  F; left facial droop and dysphagia. EXAM: CT ANGIOGRAPHY HEAD AND NECK CT PERFUSION BRAIN TECHNIQUE: Multidetector CT imaging of the head and neck was performed using the standard protocol during bolus administration of intravenous contrast. Multiplanar CT image reconstructions and MIPs were obtained to evaluate the vascular anatomy. Carotid stenosis measurements (when applicable) are obtained utilizing NASCET criteria, using the distal internal carotid diameter as the denominator. Multiphase CT imaging of the brain was performed following IV bolus contrast injection. Subsequent parametric perfusion maps were calculated using RAPID software. CONTRAST:  141mL ISOVUE-370 IOPAMIDOL (ISOVUE-370) INJECTION 76% COMPARISON:  None. FINDINGS: CTA NECK FINDINGS Aortic arch: Bovine variant branching. Imaged portion shows no evidence of aneurysm or dissection. No significant stenosis of the major arch vessel origins. Moderate calcific atherosclerosis. Right carotid system: Dense calcified plaque of the right carotid bifurcation with moderate 50-70% proximal ICA  stenosis. Left carotid system: Dense calcified plaque of the left carotid bifurcation with mild to moderate 50% proximal ICA stenosis. Severe stenosis of the left external carotid artery origin. Vertebral arteries: Left dominant. No evidence of dissection, stenosis (50% or greater) or occlusion. Skeleton: Mild cervical spondylosis. No high-grade bony canal stenosis. Other neck: Patulous esophagus. Upper chest: Ground-glass opacities within the lung apices probably representing pulmonary edema, possibly neurogenic given stroke. Small bilateral effusions. Review of the MIP images confirms the above findings CTA HEAD FINDINGS Anterior circulation: Right proximal M1 occlusion. Poor right MCA distribution collateralization. Calcific atherosclerosis of carotid siphons with mild less than 50% bilateral paraclinoid stenosis. Patent bilateral anterior cerebral arteries and left middle cerebral artery. Posterior circulation: Bilateral V4 short segments of dense calcified plaque with severe greater than 70% right and moderate 50-70% left stenosis. Patent basilar and posterior cerebral arteries. Left P1 2 junction mild stenosis. Venous sinuses: As permitted by contrast timing, patent. Anatomic variants: Anterior communicating artery and right posterior communicating artery identified. No left posterior communicating artery identified, likely hypoplastic or absent. Delayed phase: No abnormal intracranial enhancement. Review of the MIP images confirms the above findings CT Brain Perfusion Findings: CBF (<30%) Volume: 77mL Perfusion (Tmax>6.0s) volume: 184mL Mismatch Volume: 161mL Infarction Location:Right MCA distribution IMPRESSION: CT brain perfusion: 1. Core infarct 28 cc, right MCA distribution. 2. Ischemic penumbra 129 cc. CTA head: 1. Right M1 proximal occlusion. Poor right MCA distribution collateralization. 2. Otherwise patent anterior and posterior circulations without aneurysm or occlusion. 3. Bilateral ICA paraclinoid  and left P1/2 mild stenosis. 4. Bilateral V4 calcified plaque with severe right and moderate left stenosis. CTA neck: 1. Right proximal ICA moderate 50-70% stenosis with dense calcified plaque. 2.  Left proximal ICA mild to moderate 50% stenosis with dense calcified plaque. 3. No significant stenosis of vertebral arteries. 4. No dissection or aneurysm of carotid or vertebral arteries in the neck. 5. Ground-glass opacities in lung apices, probably pulmonary edema, possibly neurogenic given stroke. Small effusions. These results were called by telephone at the time of interpretation on 11/13/2017 at 10:42 pm to Dr. Amie Portland , who verbally acknowledged these results. Electronically Signed   By: Kristine Garbe M.D.   On: 11/13/2017 22:56   Ct Cerebral Perfusion W Contrast  Result Date: 11/13/2017 CLINICAL DATA:  77 y/o  F; left facial droop and dysphagia. EXAM: CT ANGIOGRAPHY HEAD AND NECK CT PERFUSION BRAIN TECHNIQUE: Multidetector CT imaging of the head and neck was performed using the standard protocol during bolus administration of intravenous contrast. Multiplanar CT image reconstructions and MIPs were obtained to evaluate the vascular anatomy. Carotid stenosis measurements (when applicable) are obtained utilizing NASCET criteria, using the distal internal carotid diameter as the denominator. Multiphase CT imaging of the brain was performed following IV bolus contrast injection. Subsequent parametric perfusion maps were calculated using RAPID software. CONTRAST:  144mL ISOVUE-370 IOPAMIDOL (ISOVUE-370) INJECTION 76% COMPARISON:  None. FINDINGS: CTA NECK FINDINGS Aortic arch: Bovine variant branching. Imaged portion shows no evidence of aneurysm or dissection. No significant stenosis of the major arch vessel origins. Moderate calcific atherosclerosis. Right carotid system: Dense calcified plaque of the right carotid bifurcation with moderate 50-70% proximal ICA stenosis. Left carotid system: Dense  calcified plaque of the left carotid bifurcation with mild to moderate 50% proximal ICA stenosis. Severe stenosis of the left external carotid artery origin. Vertebral arteries: Left dominant. No evidence of dissection, stenosis (50% or greater) or occlusion. Skeleton: Mild cervical spondylosis. No high-grade bony canal stenosis. Other neck: Patulous esophagus. Upper chest: Ground-glass opacities within the lung apices probably representing pulmonary edema, possibly neurogenic given stroke. Small bilateral effusions. Review of the MIP images confirms the above findings CTA HEAD FINDINGS Anterior circulation: Right proximal M1 occlusion. Poor right MCA distribution collateralization. Calcific atherosclerosis of carotid siphons with mild less than 50% bilateral paraclinoid stenosis. Patent bilateral anterior cerebral arteries and left middle cerebral artery. Posterior circulation: Bilateral V4 short segments of dense calcified plaque with severe greater than 70% right and moderate 50-70% left stenosis. Patent basilar and posterior cerebral arteries. Left P1 2 junction mild stenosis. Venous sinuses: As permitted by contrast timing, patent. Anatomic variants: Anterior communicating artery and right posterior communicating artery identified. No left posterior communicating artery identified, likely hypoplastic or absent. Delayed phase: No abnormal intracranial enhancement. Review of the MIP images confirms the above findings CT Brain Perfusion Findings: CBF (<30%) Volume: 69mL Perfusion (Tmax>6.0s) volume: 164mL Mismatch Volume: 190mL Infarction Location:Right MCA distribution IMPRESSION: CT brain perfusion: 1. Core infarct 28 cc, right MCA distribution. 2. Ischemic penumbra 129 cc. CTA head: 1. Right M1 proximal occlusion. Poor right MCA distribution collateralization. 2. Otherwise patent anterior and posterior circulations without aneurysm or occlusion. 3. Bilateral ICA paraclinoid and left P1/2 mild stenosis. 4.  Bilateral V4 calcified plaque with severe right and moderate left stenosis. CTA neck: 1. Right proximal ICA moderate 50-70% stenosis with dense calcified plaque. 2. Left proximal ICA mild to moderate 50% stenosis with dense calcified plaque. 3. No significant stenosis of vertebral arteries. 4. No dissection or aneurysm of carotid or vertebral arteries in the neck. 5. Ground-glass opacities in lung apices, probably pulmonary edema, possibly neurogenic given stroke. Small effusions. These results were called by telephone at the  time of interpretation on 11/13/2017 at 10:42 pm to Dr. Amie Portland , who verbally acknowledged these results. Electronically Signed   By: Kristine Garbe M.D.   On: 11/13/2017 22:56   Dg Chest Port 1 View  Result Date: 11/14/2017 CLINICAL DATA:  Pneumonia.  Hypertension and atrial fibrillation. EXAM: PORTABLE CHEST 1 VIEW COMPARISON:  11/03/2017 FINDINGS: The heart is enlarged but appears stable. Stable tortuosity and calcification of the thoracic aorta. Vascular congestion and diffuse interstitial edema and probable small right pleural effusion. Findings suggest CHF. IMPRESSION: CHF. Electronically Signed   By: Marijo Sanes M.D.   On: 11/14/2017 08:43   Ct Head Code Stroke Wo Contrast  Result Date: 11/13/2017 CLINICAL DATA:  Code stroke.  77 y/o  F; left-sided facial droop. EXAM: CT HEAD WITHOUT CONTRAST TECHNIQUE: Contiguous axial images were obtained from the base of the skull through the vertex without intravenous contrast. COMPARISON:  None. FINDINGS: Brain: Hypoattenuation is present within the right temporal lobe, insula, putamen, and caudate body with loss of gray-white differentiation likely representing an acute infarction. Aspects is 6. No acute hemorrhage. No significant mass effect. No other stroke, focal mass effect, or brain parenchymal lesion identified. Vascular: Right dense M1 likely thrombus. Skull: Normal. Negative for fracture or focal lesion.  Sinuses/Orbits: No acute finding. Other: None. ASPECTS [facility] Stroke Program Early CT Score) - Ganglionic level infarction (caudate, lentiform nuclei, internal capsule, insula, M1-M3 cortex): 3 - Supraganglionic infarction (M4-M6 cortex): 3 Total score (0-10 with 10 being normal): 6 IMPRESSION: 1. Right MCA distribution hypoattenuation with loss of gray-white differentiation compatible with acute infarct. No hemorrhage or mass effect. 2. ASPECTS is 6 3. Right M1 dense vessel, likely thrombus. These results were called by telephone at the time of interpretation on 11/13/2017 at 10:22 pm to Dr. Amie Portland , who verbally acknowledged these results. Electronically Signed   By: Kristine Garbe M.D.   On: 11/13/2017 22:27    Labs:  CBC: Recent Labs    12/17/16 1118  11/03/17 1517 11/13/17 2207 11/13/17 2211 11/14/17 0500  WBC 6.6  --  10.2 8.0  --  6.6  HGB 14.8   < > 10.0* 10.3* 10.9* 9.0*  HCT 45.1   < > 33.0* 33.7* 32.0* 28.4*  PLT 265  --  367 310  --  280   < > = values in this interval not displayed.    COAGS: Recent Labs    12/17/16 1118 11/03/17 1517 11/13/17 2207  INR 1.2 >10.00* 1.04  APTT  --   --  31    BMP: Recent Labs    12/17/16 1118  11/03/17 1517 11/13/17 2207 11/13/17 2211 11/14/17 0500  NA 140   < > 139 142 143 141  K 4.2   < > 3.7 2.8* 2.8* 2.6*  CL 96  --  104 106 103 105  CO2 27  --  23 24  --  23  GLUCOSE 79   < > 105* 102* 99 100*  BUN 26  --  36* 8 9 8   CALCIUM 9.5  --  8.3* 8.9  --  8.0*  CREATININE 1.04*  --  1.44* 1.32* 1.30* 1.23*  GFRNONAA 52*  --  34* 38*  --  41*  GFRAA 60  --  39* 44*  --  47*   < > = values in this interval not displayed.    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  Able  to ambulate in room with assistance. Currently on heparin with plans to resume Xarelto.  Groin soft; no evidence of hematoma or pseudoaneurysm.  Stable after procedure this AM.  IR to follow.   Electronically Signed: Docia Barrier, PA 11/14/2017, 2:24 PM   I spent a total of 15 Minutes at the the patient's bedside AND on the patient's hospital floor or unit, greater than 50% of which was counseling/coordinating care for stroke

## 2017-11-14 NOTE — Progress Notes (Signed)
NEUROHOSPITALISTS STROKE TEAM - DAILY PROGRESS NOTE   ADMISSION HISTORY: Teresa York is a 77 y.o. female past medical history of atrial fibrillation-unclear if she is on Xarelto currently, Barrett's esophagus with recent GI bleed, depression, chronic low back pain, congestive heart failure-systolic with a EF of 38-25%, hypertension, hypothyroidism, who was in her usual state of health when the patient and her husband went to take a nap around 8 PM.  At a few hours after that, she woke up not being able to talk properly and seemed to be in distress.  The husband also noted that she was not able to move the left side of her body.  EMS was called.  On their arrival, she had a rightward gaze preference as well as complete paralysis of the left side.    A code stroke was activated and the patient was brought to the [facility] ER.    On my initial evaluation, the patient's NIH stroke scale was 22 consistent with a complete right MCA syndrome.    The husband was not able to tell me whether the patient was still taking her Xarelto and the last dose of Xarelto was.    She had recently been evaluated in the emergency room at [facility] and then transferred to [facility] for GI workup.  Her Xarelto was discontinued for some time but he was not clear as to if she had started taking it again and if so when was the last dose.   For that reason, IV TPA was not given.    She was taken for a noncontrast CT of the head which did not reveal any acute bleed.  Dense right MCA was seen.  Aspects 67.    CT angiogram of the head and neck showed a right M1 occlusion with poor collateralization.    CT perfusion imaging showed a score of 28 cc and penumbra of 157 cc making her an amenable candidate for endovascular treatment.    Endovascular team was contacted and an IR code was activated. Patient will be undergoing endovascular thrombectomy and then taken to the ICU.    LKW: 8 PM on  11/13/2017 tpa given?: no,  On Xarelto with unclear last dose time Premorbid modified Rankin scale (mRS): 2 NIHSS on arrival-22  CODE STROKE METRICS -Code stroke paged at 9:50 PM with an ET at 25-30 minutes. -Patient arrived much sooner than the paged out ETA -Decision to not use TPA made upon history of unclear Xarelto use right at the time of CT -First contact with endovascular team 10:21 PM. -Second contact with endovascular team after CTA and perfusion images up at 10:37 PM. -IR team paged out 10:43 PM. -Some delay with intubation by anesthesia due to not receiving page when IR page sent out  SUBJECTIVE (INTERVAL HISTORY) Husband is at the bedside. Patient is found laying in bed in NAD. Overall she feels her condition is gradually improving. Voices no new complaints. No new events reported overnight.  Patient states she was taken off Xarelto one week ago for GI bleed.  She was discharged from hospital and had follow up appointment on 11/18/2017 with Cardiology to decide to put her on Eliquis BID.   OBJECTIVE Lab Results: CBC:  Recent Labs  Lab 11/13/17 2207 11/13/17 2211 11/14/17 0500  WBC 8.0  --  6.6  HGB 10.3* 10.9* 9.0*  HCT 33.7* 32.0* 28.4*  MCV 92.1  --  91.6  PLT 310  --  280   BMP: Recent  Labs  Lab 11/13/17 2207 11/13/17 2211 11/14/17 0500  NA 142 143 141  K 2.8* 2.8* 2.6*  CL 106 103 105  CO2 24  --  23  GLUCOSE 102* 99 100*  BUN 8 9 8   CREATININE 1.32* 1.30* 1.23*  CALCIUM 8.9  --  8.0*  MG  --   --  1.5*   Liver Function Tests:  Recent Labs  Lab 11/13/17 2207  AST 39  ALT 29  ALKPHOS 73  BILITOT 0.8  PROT 6.4*  ALBUMIN 2.8*   Coagulation Studies:  Recent Labs    11/13/17 2207  APTT 31  INR 1.04   PHYSICAL EXAM Temp:  [96.8 F (36 C)-98.7 F (37.1 C)] 98.3 F (36.8 C) (02/07 1142) Pulse Rate:  [39-101] 81 (02/07 1300) Resp:  [12-29] 20 (02/07 1300) BP: (66-160)/(34-96) 107/60 (02/07 1300) SpO2:  [84 %-100 %] 94 % (02/07  1300) Arterial Line BP: (103-156)/(47-65) 139/54 (02/07 0900) Weight:  [94.6 kg (208 lb 8.9 oz)] 94.6 kg (208 lb 8.9 oz) (02/07 0315) General - Well nourished, well developed elderly Caucasian lady, in no apparent distress HEENT-  Normocephalic,  Cardiovascular - Regular rate and rhythm  Respiratory - Lungs clear bilaterally. No wheezing. Abdomen - soft and non-tender, BS normal Extremities- no edema or cyanosis Neurological Exam: Mental status: Patient is awake, alert, in no acute distress Language: Her speech is mildly dysarthric. follow commands appropriately. Cranial nerves: Pupils equal round reactive to light, No  gaze deviation to the right but can look to the left past midline., No blink to threat on the left, +Left partial field cut. facial droop on the left lower face, intact hearing. Motor exam: Flaccid 3/5 left upper and lower extremities.  Normal 5/5 right upper and lower extremities. Sensory: Dense sensory loss on the left.   Coordination: Intact coordination on the right.  Unable to perform on the left. Gait examination- not tested NIHSS 4 IMAGING: I have personally reviewed the radiological images below and agree with the radiology interpretations. Ct Angio Head W Or Wo Contrast  Result Date: 11/13/2017 CLINICAL DATA:  77 y/o  F; left facial droop and dysphagia. EXAM: CT ANGIOGRAPHY HEAD AND NECK CT PERFUSION BRAIN TECHNIQUE: Multidetector CT imaging of the head and neck was performed using the standard protocol during bolus administration of intravenous contrast. Multiplanar CT image reconstructions and MIPs were obtained to evaluate the vascular anatomy. Carotid stenosis measurements (when applicable) are obtained utilizing NASCET criteria, using the distal internal carotid diameter as the denominator. Multiphase CT imaging of the brain was performed following IV bolus contrast injection. Subsequent parametric perfusion maps were calculated using RAPID software. CONTRAST:   182mL ISOVUE-370 IOPAMIDOL (ISOVUE-370) INJECTION 76% COMPARISON:  None. FINDINGS: CTA NECK FINDINGS Aortic arch: Bovine variant branching. Imaged portion shows no evidence of aneurysm or dissection. No significant stenosis of the major arch vessel origins. Moderate calcific atherosclerosis. Right carotid system: Dense calcified plaque of the right carotid bifurcation with moderate 50-70% proximal ICA stenosis. Left carotid system: Dense calcified plaque of the left carotid bifurcation with mild to moderate 50% proximal ICA stenosis. Severe stenosis of the left external carotid artery origin. Vertebral arteries: Left dominant. No evidence of dissection, stenosis (50% or greater) or occlusion. Skeleton: Mild cervical spondylosis. No high-grade bony canal stenosis. Other neck: Patulous esophagus. Upper chest: Ground-glass opacities within the lung apices probably representing pulmonary edema, possibly neurogenic given stroke. Small bilateral effusions. Review of the MIP images confirms the above findings CTA HEAD FINDINGS Anterior  circulation: Right proximal M1 occlusion. Poor right MCA distribution collateralization. Calcific atherosclerosis of carotid siphons with mild less than 50% bilateral paraclinoid stenosis. Patent bilateral anterior cerebral arteries and left middle cerebral artery. Posterior circulation: Bilateral V4 short segments of dense calcified plaque with severe greater than 70% right and moderate 50-70% left stenosis. Patent basilar and posterior cerebral arteries. Left P1 2 junction mild stenosis. Venous sinuses: As permitted by contrast timing, patent. Anatomic variants: Anterior communicating artery and right posterior communicating artery identified. No left posterior communicating artery identified, likely hypoplastic or absent. Delayed phase: No abnormal intracranial enhancement. Review of the MIP images confirms the above findings CT Brain Perfusion Findings: CBF (<30%) Volume: 62mL Perfusion  (Tmax>6.0s) volume: 161mL Mismatch Volume: 158mL Infarction Location:Right MCA distribution IMPRESSION: CT brain perfusion: 1. Core infarct 28 cc, right MCA distribution. 2. Ischemic penumbra 129 cc. CTA head: 1. Right M1 proximal occlusion. Poor right MCA distribution collateralization. 2. Otherwise patent anterior and posterior circulations without aneurysm or occlusion. 3. Bilateral ICA paraclinoid and left P1/2 mild stenosis. 4. Bilateral V4 calcified plaque with severe right and moderate left stenosis. CTA neck: 1. Right proximal ICA moderate 50-70% stenosis with dense calcified plaque. 2. Left proximal ICA mild to moderate 50% stenosis with dense calcified plaque. 3. No significant stenosis of vertebral arteries. 4. No dissection or aneurysm of carotid or vertebral arteries in the neck. 5. Ground-glass opacities in lung apices, probably pulmonary edema, possibly neurogenic given stroke. Small effusions. These results were called by telephone at the time of interpretation on 11/13/2017 at 10:42 pm to Dr. Amie Portland , who verbally acknowledged these results. Electronically Signed   By: Kristine Garbe M.D.   On: 11/13/2017 22:56   Ct Head Wo Contrast  Result Date: 11/14/2017 CLINICAL DATA:  Follow-up examination status post arteriogram. EXAM: CT HEAD WITHOUT CONTRAST TECHNIQUE: Contiguous axial images were obtained from the base of the skull through the vertex without intravenous contrast. COMPARISON:  Prior studies from 11/13/2017. FINDINGS: Brain: Atrophy with chronic microvascular ischemic disease. No acute intracranial hemorrhage. Subtle asymmetric hypodensity within the anterior right temporal lobe suspicious for evolving ischemia (series 3, image 12). No significant mass effect. No other acute or evolving large vessel territory infarct. No mass lesion or midline shift. No hydrocephalus. No extra-axial fluid collection. Vascular: Diffuse hyperdensity throughout the intracranial circulation,  likely reflecting contrast material. Intracranial atherosclerosis again noted. Skull: And calvarium within normal limits.  Scalp soft tissues Sinuses/Orbits: Globes and orbital soft tissues within normal limits. Paranasal sinuses and mastoid air cells are clear. Other: None. IMPRESSION: 1. No acute intracranial hemorrhage or other complication identified status post catheter directed revascularization. 2. Hypodensity within the anterior right temporal lobe, consistent with evolving right MCA territory infarct. No associated mass effect. 3. Otherwise stable appearance of the brain. No other acute intracranial abnormality. Electronically Signed   By: Jeannine Boga M.D.   On: 11/14/2017 05:07   Ct Angio Neck W Or Wo Contrast  Result Date: 11/13/2017 CLINICAL DATA:  77 y/o  F; left facial droop and dysphagia. EXAM: CT ANGIOGRAPHY HEAD AND NECK CT PERFUSION BRAIN TECHNIQUE: Multidetector CT imaging of the head and neck was performed using the standard protocol during bolus administration of intravenous contrast. Multiplanar CT image reconstructions and MIPs were obtained to evaluate the vascular anatomy. Carotid stenosis measurements (when applicable) are obtained utilizing NASCET criteria, using the distal internal carotid diameter as the denominator. Multiphase CT imaging of the brain was performed following IV bolus contrast injection. Subsequent parametric  perfusion maps were calculated using RAPID software. CONTRAST:  16mL ISOVUE-370 IOPAMIDOL (ISOVUE-370) INJECTION 76% COMPARISON:  None. FINDINGS: CTA NECK FINDINGS Aortic arch: Bovine variant branching. Imaged portion shows no evidence of aneurysm or dissection. No significant stenosis of the major arch vessel origins. Moderate calcific atherosclerosis. Right carotid system: Dense calcified plaque of the right carotid bifurcation with moderate 50-70% proximal ICA stenosis. Left carotid system: Dense calcified plaque of the left carotid bifurcation with  mild to moderate 50% proximal ICA stenosis. Severe stenosis of the left external carotid artery origin. Vertebral arteries: Left dominant. No evidence of dissection, stenosis (50% or greater) or occlusion. Skeleton: Mild cervical spondylosis. No high-grade bony canal stenosis. Other neck: Patulous esophagus. Upper chest: Ground-glass opacities within the lung apices probably representing pulmonary edema, possibly neurogenic given stroke. Small bilateral effusions. Review of the MIP images confirms the above findings CTA HEAD FINDINGS Anterior circulation: Right proximal M1 occlusion. Poor right MCA distribution collateralization. Calcific atherosclerosis of carotid siphons with mild less than 50% bilateral paraclinoid stenosis. Patent bilateral anterior cerebral arteries and left middle cerebral artery. Posterior circulation: Bilateral V4 short segments of dense calcified plaque with severe greater than 70% right and moderate 50-70% left stenosis. Patent basilar and posterior cerebral arteries. Left P1 2 junction mild stenosis. Venous sinuses: As permitted by contrast timing, patent. Anatomic variants: Anterior communicating artery and right posterior communicating artery identified. No left posterior communicating artery identified, likely hypoplastic or absent. Delayed phase: No abnormal intracranial enhancement. Review of the MIP images confirms the above findings CT Brain Perfusion Findings: CBF (<30%) Volume: 16mL Perfusion (Tmax>6.0s) volume: 162mL Mismatch Volume: 177mL Infarction Location:Right MCA distribution IMPRESSION: CT brain perfusion: 1. Core infarct 28 cc, right MCA distribution. 2. Ischemic penumbra 129 cc. CTA head: 1. Right M1 proximal occlusion. Poor right MCA distribution collateralization. 2. Otherwise patent anterior and posterior circulations without aneurysm or occlusion. 3. Bilateral ICA paraclinoid and left P1/2 mild stenosis. 4. Bilateral V4 calcified plaque with severe right and  moderate left stenosis. CTA neck: 1. Right proximal ICA moderate 50-70% stenosis with dense calcified plaque. 2. Left proximal ICA mild to moderate 50% stenosis with dense calcified plaque. 3. No significant stenosis of vertebral arteries. 4. No dissection or aneurysm of carotid or vertebral arteries in the neck. 5. Ground-glass opacities in lung apices, probably pulmonary edema, possibly neurogenic given stroke. Small effusions. These results were called by telephone at the time of interpretation on 11/13/2017 at 10:42 pm to Dr. Amie Portland , who verbally acknowledged these results. Electronically Signed   By: Kristine Garbe M.D.   On: 11/13/2017 22:56   Ct Cerebral Perfusion W Contrast  Result Date: 11/13/2017 CLINICAL DATA:  77 y/o  F; left facial droop and dysphagia. EXAM: CT ANGIOGRAPHY HEAD AND NECK CT PERFUSION BRAIN TECHNIQUE: Multidetector CT imaging of the head and neck was performed using the standard protocol during bolus administration of intravenous contrast. Multiplanar CT image reconstructions and MIPs were obtained to evaluate the vascular anatomy. Carotid stenosis measurements (when applicable) are obtained utilizing NASCET criteria, using the distal internal carotid diameter as the denominator. Multiphase CT imaging of the brain was performed following IV bolus contrast injection. Subsequent parametric perfusion maps were calculated using RAPID software. CONTRAST:  164mL ISOVUE-370 IOPAMIDOL (ISOVUE-370) INJECTION 76% COMPARISON:  None. FINDINGS: CTA NECK FINDINGS Aortic arch: Bovine variant branching. Imaged portion shows no evidence of aneurysm or dissection. No significant stenosis of the major arch vessel origins. Moderate calcific atherosclerosis. Right carotid system: Dense calcified plaque of  the right carotid bifurcation with moderate 50-70% proximal ICA stenosis. Left carotid system: Dense calcified plaque of the left carotid bifurcation with mild to moderate 50% proximal ICA  stenosis. Severe stenosis of the left external carotid artery origin. Vertebral arteries: Left dominant. No evidence of dissection, stenosis (50% or greater) or occlusion. Skeleton: Mild cervical spondylosis. No high-grade bony canal stenosis. Other neck: Patulous esophagus. Upper chest: Ground-glass opacities within the lung apices probably representing pulmonary edema, possibly neurogenic given stroke. Small bilateral effusions. Review of the MIP images confirms the above findings CTA HEAD FINDINGS Anterior circulation: Right proximal M1 occlusion. Poor right MCA distribution collateralization. Calcific atherosclerosis of carotid siphons with mild less than 50% bilateral paraclinoid stenosis. Patent bilateral anterior cerebral arteries and left middle cerebral artery. Posterior circulation: Bilateral V4 short segments of dense calcified plaque with severe greater than 70% right and moderate 50-70% left stenosis. Patent basilar and posterior cerebral arteries. Left P1 2 junction mild stenosis. Venous sinuses: As permitted by contrast timing, patent. Anatomic variants: Anterior communicating artery and right posterior communicating artery identified. No left posterior communicating artery identified, likely hypoplastic or absent. Delayed phase: No abnormal intracranial enhancement. Review of the MIP images confirms the above findings CT Brain Perfusion Findings: CBF (<30%) Volume: 78mL Perfusion (Tmax>6.0s) volume: 155mL Mismatch Volume: 118mL Infarction Location:Right MCA distribution IMPRESSION: CT brain perfusion: 1. Core infarct 28 cc, right MCA distribution. 2. Ischemic penumbra 129 cc. CTA head: 1. Right M1 proximal occlusion. Poor right MCA distribution collateralization. 2. Otherwise patent anterior and posterior circulations without aneurysm or occlusion. 3. Bilateral ICA paraclinoid and left P1/2 mild stenosis. 4. Bilateral V4 calcified plaque with severe right and moderate left stenosis. CTA neck: 1.  Right proximal ICA moderate 50-70% stenosis with dense calcified plaque. 2. Left proximal ICA mild to moderate 50% stenosis with dense calcified plaque. 3. No significant stenosis of vertebral arteries. 4. No dissection or aneurysm of carotid or vertebral arteries in the neck. 5. Ground-glass opacities in lung apices, probably pulmonary edema, possibly neurogenic given stroke. Small effusions. These results were called by telephone at the time of interpretation on 11/13/2017 at 10:42 pm to Dr. Amie Portland , who verbally acknowledged these results. Electronically Signed   By: Kristine Garbe M.D.   On: 11/13/2017 22:56   Dg Chest Port 1 View  Result Date: 11/14/2017 CLINICAL DATA:  Pneumonia.  Hypertension and atrial fibrillation. EXAM: PORTABLE CHEST 1 VIEW COMPARISON:  11/03/2017 FINDINGS: The heart is enlarged but appears stable. Stable tortuosity and calcification of the thoracic aorta. Vascular congestion and diffuse interstitial edema and probable small right pleural effusion. Findings suggest CHF. IMPRESSION: CHF. Electronically Signed   By: Marijo Sanes M.D.   On: 11/14/2017 08:43   Ct Head Code Stroke Wo Contrast  Result Date: 11/13/2017 CLINICAL DATA:  Code stroke.  77 y/o  F; left-sided facial droop. EXAM: CT HEAD WITHOUT CONTRAST TECHNIQUE: Contiguous axial images were obtained from the base of the skull through the vertex without intravenous contrast. COMPARISON:  None. FINDINGS: Brain: Hypoattenuation is present within the right temporal lobe, insula, putamen, and caudate body with loss of gray-white differentiation likely representing an acute infarction. Aspects is 6. No acute hemorrhage. No significant mass effect. No other stroke, focal mass effect, or brain parenchymal lesion identified. Vascular: Right dense M1 likely thrombus. Skull: Normal. Negative for fracture or focal lesion. Sinuses/Orbits: No acute finding. Other: None. ASPECTS [facility] Stroke Program Early CT Score) -  Ganglionic level infarction (caudate, lentiform nuclei, internal capsule, insula,  M1-M3 cortex): 3 - Supraganglionic infarction (M4-M6 cortex): 3 Total score (0-10 with 10 being normal): 6 IMPRESSION: 1. Right MCA distribution hypoattenuation with loss of gray-white differentiation compatible with acute infarct. No hemorrhage or mass effect. 2. ASPECTS is 6 3. Right M1 dense vessel, likely thrombus. These results were called by telephone at the time of interpretation on 11/13/2017 at 10:22 pm to Dr. Amie Portland , who verbally acknowledged these results. Electronically Signed   By: Kristine Garbe M.D.   On: 11/13/2017 22:27   Echocardiogram:                                              PENDING MRI Head                                                           PENDING     IMPRESSION: Teresa York is a 77 y.o. female with PMH of atrial fibrillation - Xarelto discontinued due to recent GI bleed, chronic low back pain, congestive heart failure, hypertension, hypothyroidism with acute onset of left-sided weakness and right gaze preference along with dysarthria and left-sided facial droop.  NIH stroke scale 22 on arrival. She was a candidate for endovascular thrombectomy.    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 , Likely undx OSA, CHF  PROCEDURES: By Dr Estanislado Pandy on 11/14/2017 S/P RT common carotid arteriogram. RT CFA approacg. Findings. 1.S/P complete revascularization of RT MCA M1 occlusion with x 2 passes with 38mm x 40 mm retriever device using the solumbra technique with TICI 3 reperfusion  Outstanding Stroke Work-up Studies:     Echocardiogram:                                                    PENDING MRI Head                                                                  PENDING  11/14/2017 ASSESSMENT:   Neuro exam slowly improving. Able to move Left side slowly on exam this AM. Discussed with patient and husband at bedside imaging, labs, procedures and need to restart [facility] therapy for future stroke risk prevention. Verbalize good understanding of POC. Plan to get patient OOB today and restart home medications once SLP evaluation passed.  PLAN  11/14/2017: Continue Aspirin/ Statin for now, will decide timing of Ellquis in AM Frequent neuro checks Telemetry monitoring PT/OT/SLP Consult PM & Rehab Consult Case Management /MSW Ongoing aggressive stroke risk factor management Patient counseled to be compliant with her antithrombotic medications Patient counseled on Lifestyle modifications including, Diet, Exercise, and  Stress Follow up with [facility] in 6 weeks Follow up with Opthalmology - No driving until cleared at follow up appointment Follow up with PCP and Cardiology  MEDICAL ISSUES:  Acute Respiratory Failure -intubated for endovascular thrombectomy. -vent management per ICU Extubated this AM CXR stable with acute findings  Heart failure, Acute on chronic systolic (congestive) heart failure  -TTE -Appreciate critical care assistance in medical management. CXR stable with acute findings Lasix in progress  Chronic atrial fibrillation -Rate control -Will eventually need to be on anticoagulation.  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 Will restart home B/P medications today Home Meds: Toprol-XL, Pacerone  HYPERLIPIDEMIA:    Component Value Date/Time   CHOL 152 11/14/2017 0500   TRIG 38 11/14/2017 0500   HDL 53 11/14/2017 0500   CHOLHDL 2.9 11/14/2017 0500   VLDL 8 11/14/2017 0500   LDLCALC 91 11/14/2017 0500  Home Meds:  NONE LDL  goal < 70 Started on  Lipitor to 20 mg daily Continue statin at discharge  R/O DIABETES: Lab Results  Component Value Date   HGBA1C 4.8 11/14/2017  HgbA1c goal < 7.0  OBESITY Obesity, Body mass index is 38.15 kg/m. Greater than/equal to 30  Other Active Problems: Active Problems:   Stroke (cerebrum) (HCC)   Middle cerebral artery embolism, right    Hospital day # 1 VTE prophylaxis: Heparin Diet : Fall precautions DIET SOFT Room service appropriate? Yes; Fluid consistency: Thin   FAMILY UPDATES:  family at bedside  TEAM UPDATES: Garvin Fila, MD   Prior Home Stroke Medications:  No antithrombotic  Discharge Stroke Meds:  Please discharge patient on TBD, likely to start Eliquis BID   Disposition: 02-Transferred to [place]:               PENDING Follow Up:  Follow-up Information    Garvin Fila, MD. Schedule an appointment as soon as possible for a visit in 6 week(s).   Specialties:  Neurology, Radiology Contact information: [address] [place] [address] [phone]          Maylon Peppers, MD -PCP Follow up in 1-2 weeks   Case Management aware of need   Assessment & plan discussed with with attending physician and they are in agreement.    Mary Sella, ANP-C Stroke Neurology Team 11/14/2017 2:10 PM I have personally examined this patient, reviewed notes, independently viewed imaging studies, participated in medical decision making and plan of care.ROS completed by me personally and pertinent  positives fully documented  I have made any additions or clarifications directly to the above note. Agree with note above.  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 Check MRI scan of the brain. Strict blood pressure control. Cardiology consult for management of congestive heart failure and decision on eliquis for anticoagulation. Long discussion of the bedside with the  patient and husband and answered questions. This patient is critically ill and at significant risk of neurological worsening, death and care requires constant monitoring of vital signs, hemodynamics,respiratory and cardiac monitoring, extensive review of multiple databases, frequent neurological assessment, discussion with family, other specialists and medical decision making of high complexity.I have made any additions or clarifications directly to the above note.This critical care time does not reflect procedure time, or teaching time or supervisory time of PA/NP/Med Resident etc but could involve care discussion time.  I spent 40 minutes of neurocritical care time  in the care of  this patient.     Antony Contras, MD Medical Director [place] Pager: [phone] 11/14/2017 3:59 PM   To contact Stroke Continuity provider, please refer to [email or web address] After hours, contact General Neurology

## 2017-11-14 NOTE — Transfer of Care (Signed)
Immediate Anesthesia Transfer of Care Note  Patient: Teresa York  Procedure(s) Performed: IR WITH ANESTHESIA (N/A )  Patient Location: PACU  Anesthesia Type:General  Level of Consciousness: awake  Airway & Oxygen Therapy: Patient Spontanous Breathing and Patient connected to face mask oxygen  Post-op Assessment: Report given to RN and Post -op Vital signs reviewed and stable  Post vital signs: Reviewed and stable  Last Vitals:  Vitals:   11/13/17 2232 11/13/17 2304  BP:    Pulse:    Resp:    Temp:  37.1 C  SpO2: 96%     Last Pain:  Vitals:   11/13/17 2215  TempSrc:   PainSc: 0-No pain         Complications: No apparent anesthesia complications

## 2017-11-14 NOTE — Evaluation (Signed)
Clinical/Bedside Swallow Evaluation Patient Details  Name: Ajna Moors MRN: [id number] Date of Birth: 09/15/1941  Today's Date: 11/14/2017 Time: SLP Start Time (ACUTE ONLY): 1100 SLP Stop Time (ACUTE ONLY): 1139 SLP Time Calculation (min) (ACUTE ONLY): 39 min  Past Medical History:  Past Medical History:  Diagnosis Date  . Allergic rhinitis   . Anxiety   . Atrial fibrillation ([place])   . Barrett's esophagus   . Breast cancer ([place]) 2005   "left"  . Chronic lower back pain   . Chronic systolic CHF (congestive heart failure) (HCC)    a. EF 30-35% felt to be possibly be tachy mediated from afib wtih RVR  . Depression   . Diverticulosis   . GERD (gastroesophageal reflux disease)   . Hiatal hernia   . Hypertension   . Hypothyroidism   . Left atrial enlargement   . Mitral regurgitation   . OAB (overactive bladder)   . Persistent atrial fibrillation ([place]) 10/31/2016   a. s/p failed DCCV x3. Now on amiodarone.   . 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)   Past Surgical History:  Past Surgical History:  Procedure Laterality Date  . BACK SURGERY    . BREAST BIOPSY Left 2005  . BREAST LUMPECTOMY Left 2005  . CARDIOVERSION N/A 11/02/2016   Procedure: CARDIOVERSION;  Surgeon: Thayer Headings, MD;  Location: [facility];  Service: Cardiovascular;  Laterality: N/A;  . CARDIOVERSION N/A 11/16/2016   Procedure: CARDIOVERSION;  Surgeon: Fay Records, MD;  Location: [facility];  Service: Cardiovascular;  Laterality: N/A;  . CARDIOVERSION N/A 12/24/2016   Procedure: CARDIOVERSION;  Surgeon: Sanda Klein, MD;  [facility];  Service: Cardiovascular;  Laterality: N/A;  . CARPAL TUNNEL RELEASE Bilateral 1995  . COLONOSCOPY    . ELBOW FRACTURE SURGERY Right 2009   with implant  . ESOPHAGOGASTRODUODENOSCOPY    . ESOPHAGUS SURGERY     "in the process of getting cancerous cell off my esophagus" (10/31/2016)  . FRACTURE SURGERY    . LUMBAR DISC SURGERY     L5  .  MOHS SURGERY     "corner of right eye; on left cheek"  . TEE WITHOUT CARDIOVERSION N/A 11/16/2016   Procedure: TRANSESOPHAGEAL ECHOCARDIOGRAM (TEE);  Surgeon: Fay Records, MD;  Location: [facility];  Service: Cardiovascular;  Laterality: N/A;  . TONSILLECTOMY    . TUBAL LIGATION  1980   HPI:  Kaleah Wentzis a 77 y.o.femalepast medical history of atrial fibrillation-unclear if she is on Xarelto currently, Barrett's esophagus with recent GI bleed, depression, chronic low back pain, congestive heart failure-systolic with a EF of 73-71%, hypertension, hypothyroidism, who was in her usual state of health when the patient and her husband went to take a nap around 8 PM. At a few hours after that, she woke up not being able to talk properly and seemed to be in distress. The husband also noted that she was not able to move the left side of her body. underwent endovascular thrombectomy on 2/7 in the early am. Extubated post procedure. CT shows Hypodensity within the anterior right temporal lobe, consistent with evolving right MCA territory infarct. No associated mass effect.  CXR shows CHF. Upper Endoscopy in 2017 showed Barrett's Esophgus and a distal stricture that was dilated. She recently underwent a procedure at [facility] to "scrape" the squamous cells from her esophagus and was off her Xarelto. SHe did ahve an episode of regurgiation of blood after the procedure. Husband reprots she  was eating soft foods, but appetite was not great.    Assessment / Plan / Recommendation Clinical Impression  Pt demonstrates normal swallow function, vocal quality normal. She reports occasional coughing related to reflux and was noted to have some belching with minimal intake today, but no signs of aspiration. Pt may initiate a soft diet, no SLP f/u needed for swallowing.  SLP Visit Diagnosis: Dysphagia, unspecified (R13.10)    Aspiration Risk  Mild aspiration risk    Diet Recommendation Other (Comment)(soft diet,  thin)   Liquid Administration via: Cup;Straw Medication Administration: Whole meds with liquid Supervision: Patient able to self feed Compensations: Slow rate;Small sips/bites Postural Changes: Seated upright at 90 degrees    Other  Recommendations Oral Care Recommendations: Oral care BID   Follow up Recommendations        Frequency and Duration            Prognosis        Swallow Study   General HPI: Hadassa Wentzis a 77 y.o.femalepast medical history of atrial fibrillation-unclear if she is on Xarelto currently, Barrett's esophagus with recent GI bleed, depression, chronic low back pain, congestive heart failure-systolic with a EF of 53-97%, hypertension, hypothyroidism, who was in her usual state of health when the patient and her husband went to take a nap around 8 PM. At a few hours after that, she woke up not being able to talk properly and seemed to be in distress. The husband also noted that she was not able to move the left side of her body. underwent endovascular thrombectomy on 2/7 in the early am. Extubated post procedure. CT shows Hypodensity within the anterior right temporal lobe, consistent with evolving right MCA territory infarct. No associated mass effect.  CXR shows CHF. Upper Endoscopy in 2017 showed Barrett's Esophgus and a distal stricture that was dilated. She recently underwent a procedure at [facility] to "scrape" the squamous cells from her esophagus and was off her Xarelto. SHe did ahve an episode of regurgiation of blood after the procedure. Husband reprots she was eating soft foods, but appetite was not great.  Type of Study: Bedside Swallow Evaluation Previous Swallow Assessment: none Diet Prior to this Study: NPO Temperature Spikes Noted: No Respiratory Status: Room air History of Recent Intubation: No Behavior/Cognition: Alert;Cooperative;Pleasant mood Oral Cavity Assessment: Within Functional Limits Oral Care Completed by SLP: No Oral Cavity -  Dentition: Adequate natural dentition Self-Feeding Abilities: Able to feed self;Needs assist Patient Positioning: Upright in bed Baseline Vocal Quality: Normal Volitional Cough: Strong Volitional Swallow: Able to elicit    Oral/Motor/Sensory Function Overall Oral Motor/Sensory Function: Within functional limits   Ice Chips     Thin Liquid Thin Liquid: Within functional limits Presentation: Cup;Straw    Nectar Thick Nectar Thick Liquid: Not tested   Honey Thick Honey Thick Liquid: Not tested   Puree Puree: Within functional limits   Solid   GO   Solid: Within functional limits       [facility], MA CCC-SLP [phone]  Yuki Purves, Katherene Ponto 11/14/2017,11:50 AM

## 2017-11-14 NOTE — Evaluation (Signed)
Occupational Therapy Evaluation Patient Details Name: Teresa York MRN: [id number] DOB: 02-21-1941 Today's Date: 11/14/2017    History of Present Illness 77 yo admitted with left weakness with R MCA CVA s/p revascularization. PMHx: Afib, HTN, CHF, EF 30-50%, chronic LBP   Clinical Impression   This 77 yo female admitted with above presents to acute OT with decreased balance, slowness in answering questions and generalized weakness all affecting her safety and independence with basic ADLs. She will benefit from acute OT with follow up [place] to get back to PLOF.    Follow Up Recommendations  Home health OT;Supervision/Assistance - 24 hour    Equipment Recommendations  None recommended by OT       Precautions / Restrictions Precautions Precautions: Fall Restrictions Weight Bearing Restrictions: No      Mobility Bed Mobility Overal bed mobility: Needs Assistance Bed Mobility: Supine to Sit     Supine to sit: Min assist     General bed mobility comments: min assist to fully elevate trunk with increased time  Transfers Overall transfer level: Needs assistance   Transfers: Sit to/from Stand Sit to Stand: Min guard         General transfer comment: cues for hand placement and safety rising from bed and toilet with rail     Balance Overall balance assessment: Needs assistance Sitting-balance support: No upper extremity supported Sitting balance-Leahy Scale: Fair     Standing balance support: During functional activity;Bilateral upper extremity supported Standing balance-Leahy Scale: Poor Standing balance comment: reliant on RW                           ADL either performed or assessed with clinical judgement   ADL Overall ADL's : Needs assistance/impaired Eating/Feeding: NPO   Grooming: Wash/dry hands;Min guard;Standing   Upper Body Bathing: Set up;Supervision/ safety;Sitting   Lower Body Bathing: Moderate assistance Lower Body Bathing Details  (indicate cue type and reason): Min guard A sit<>stand Upper Body Dressing : Minimal assistance;Sitting   Lower Body Dressing: Moderate assistance Lower Body Dressing Details (indicate cue type and reason): Min guard A sit<>stand Toilet Transfer: Minimal assistance;Ambulation;RW;Comfort height toilet;Grab bars   Toileting- Clothing Manipulation and Hygiene: Min guard;Sit to/from stand               Vision Baseline Vision/History: Wears glasses Wears Glasses: Reading only Patient Visual Report: No change from baseline Additional Comments: needs to be further assessed            Pertinent Vitals/Pain Pain Assessment: No/denies pain     Hand Dominance Right   Extremity/Trunk Assessment Upper Extremity Assessment Upper Extremity Assessment: Generalized weakness   Lower Extremity Assessment Lower Extremity Assessment: Generalized weakness;Overall [facility] for tasks assessed   Cervical / Trunk Assessment Cervical / Trunk Assessment: Kyphotic   Communication Communication Communication: No difficulties   Cognition Arousal/Alertness: Awake/alert Behavior During Therapy: Flat affect Overall Cognitive Status: Impaired/Different from baseline Area of Impairment: Following commands;Problem solving;Orientation                 Orientation Level: Situation     Following Commands: Follows one step commands consistently;Follows one step commands with increased time     Problem Solving: Slow processing                Home Living Family/patient expects to be discharged to:: Private residence Living Arrangements: Spouse/significant other Available Help at Discharge: Family;Available 24 hours/day Type of Home: House Home Access: Stairs to  enter Entrance Stairs-Number of Steps: 4   Home Layout: Multi-level Alternate Level Stairs-Number of Steps: 8 Alternate Level Stairs-Rails: Right;Left Bathroom Shower/Tub: Tub/shower unit   Bathroom Toilet: Handicapped height      Home Equipment: Environmental consultant - 2 wheels;Cane - single point;Shower seat;Hand held Tourist information centre manager - 4 wheels   Additional Comments: pt receiving HHPT, OT, RN grossly 1 week      Prior Functioning/Environment Level of Independence: Independent                 OT Problem List: Impaired balance (sitting and/or standing);Decreased cognition      OT Treatment/Interventions: Self-care/ADL training;Balance training;Therapeutic activities;Visual/perceptual remediation/compensation;DME and/or AE instruction;Patient/family education    OT Goals(Current goals can be found in the care plan section) Acute Rehab OT Goals Patient Stated Goal: return home OT Goal Formulation: With patient/family Time For Goal Achievement: 11/28/17 Potential to Achieve Goals: Good  OT Frequency: Min 3X/week           Co-evaluation PT/OT/SLP Co-Evaluation/Treatment: Yes Reason for Co-Treatment: For patient/therapist safety PT goals addressed during session: Mobility/safety with mobility;Balance;Proper use of DME OT goals addressed during session: ADL's and self-care;Strengthening/ROM      AM-PAC PT "6 Clicks" Daily Activity     Outcome Measure Help from another person eating meals?: A Little Help from another person taking care of personal grooming?: A Little Help from another person toileting, which includes using toliet, bedpan, or urinal?: A Little Help from another person bathing (including washing, rinsing, drying)?: A Little Help from another person to put on and taking off regular upper body clothing?: A Little Help from another person to put on and taking off regular lower body clothing?: A Lot 6 Click Score: 17   End of Session Equipment Utilized During Treatment: Gait belt;Rolling walker Nurse Communication: Mobility status  Activity Tolerance: Patient tolerated treatment well Patient left: in chair;with call bell/phone within reach;with chair alarm set;with family/visitor present  OT  Visit Diagnosis: Unsteadiness on feet (R26.81);Muscle weakness (generalized) (M62.81);Cognitive communication deficit (R41.841) Symptoms and signs involving cognitive functions: Cerebral infarction                Time: 5631-4970 OT Time Calculation (min): 32 min Charges:  OT General Charges $OT Visit: 1 Visit OT Evaluation $OT Eval Moderate Complexity: [address], [place] [phone] 11/14/2017

## 2017-11-14 NOTE — Progress Notes (Signed)
Pt tx from IR via bed. Oriented to self, following command, unable to hold legs. Lt leg weaker. Denies numbness or tingling.pt was on cardene gtts, titrated and dc'd At 0200, due to abp was 108/55, cuff bp was 78/56~90/47.pt is drowsy but awake and answer the question.

## 2017-11-14 NOTE — Evaluation (Signed)
Speech Language Pathology Evaluation Patient Details Name: Teresa York MRN: [id number] DOB: 04/30/1941 Today's Date: 11/14/2017 Time: 1027-2536 SLP Time Calculation (min) (ACUTE ONLY): 39 min  Problem List:  Patient Active Problem List   Diagnosis Date Noted  . 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 Persistent atrial fibrillation ([place])   . Chronic systolic CHF (congestive heart failure) ([place])   . Hypertension   . Hypothyroidism   . Acute on chronic combined systolic and diastolic CHF (congestive heart failure) ([place]) 11/12/2016  . Aortic valve regurgitation 10/31/2016  . Atrial fibrillation with RVR ([place]) 10/31/2016  . Barrett's esophagus without dysplasia 10/27/2015  . Dysphagia 10/27/2015  . Loss of weight 10/27/2015  . Chronic anticoagulation 10/27/2015   Past Medical History:  Past Medical History:  Diagnosis Date  . Allergic rhinitis   . Anxiety   . Atrial fibrillation ([place])   . Barrett's esophagus   . Breast cancer ([place]) 2005   "left"  . Chronic lower back pain   . Chronic systolic CHF (congestive heart failure) (HCC)    a. EF 30-35% felt to be possibly be tachy mediated from afib wtih RVR  . Depression   . Diverticulosis   . GERD (gastroesophageal reflux disease)   . Hiatal hernia   . Hypertension   . Hypothyroidism   . Left atrial enlargement   . Mitral regurgitation   . OAB (overactive bladder)   . Persistent atrial fibrillation ([place]) 10/31/2016   a. s/p failed DCCV x3. Now on amiodarone.   . 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)   Past Surgical History:  Past Surgical History:  Procedure Laterality Date  . BACK SURGERY    . BREAST BIOPSY Left 2005  . BREAST LUMPECTOMY Left 2005  . CARDIOVERSION N/A 11/02/2016   Procedure: CARDIOVERSION;  Surgeon: Thayer Headings, MD;  Location: [facility];  Service: Cardiovascular;  Laterality: N/A;  . CARDIOVERSION N/A 11/16/2016   Procedure:  CARDIOVERSION;  Surgeon: Fay Records, MD;  Location: [facility];  Service: Cardiovascular;  Laterality: N/A;  . CARDIOVERSION N/A 12/24/2016   Procedure: CARDIOVERSION;  Surgeon: Sanda Klein, MD;  [facility];  Service: Cardiovascular;  Laterality: N/A;  . CARPAL TUNNEL RELEASE Bilateral 1995  . COLONOSCOPY    . ELBOW FRACTURE SURGERY Right 2009   with implant  . ESOPHAGOGASTRODUODENOSCOPY    . ESOPHAGUS SURGERY     "in the process of getting cancerous cell off my esophagus" (10/31/2016)  . FRACTURE SURGERY    . LUMBAR DISC SURGERY     L5  . MOHS SURGERY     "corner of right eye; on left cheek"  . TEE WITHOUT CARDIOVERSION N/A 11/16/2016   Procedure: TRANSESOPHAGEAL ECHOCARDIOGRAM (TEE);  Surgeon: Fay Records, MD;  Location: [facility];  Service: Cardiovascular;  Laterality: N/A;  . TONSILLECTOMY    . TUBAL LIGATION  1980   HPI:  Teresa Wentzis a 77 y.o.femalepast medical history of atrial fibrillation-unclear if she is on Xarelto currently, Barrett's esophagus with recent GI bleed, depression, chronic low back pain, congestive heart failure-systolic with a EF of 64-40%, hypertension, hypothyroidism, who was in her usual state of health when the patient and her husband went to take a nap around 8 PM. At a few hours after that, she woke up not being able to talk properly and seemed to be in distress. The husband also noted that she was not able to move the left  side of her body. underwent endovascular thrombectomy on 2/7 in the early am. Extubated post procedure. CT shows Hypodensity within the anterior right temporal lobe, consistent with evolving right MCA territory infarct. No associated mass effect.  CXR shows CHF. Upper Endoscopy in 2017 showed Barrett's Esophgus and a distal stricture that was dilated. She recently underwent a procedure at [facility] to "scrape" the squamous cells from her esophagus and was off her Xarelto. SHe did ahve an episode of regurgiation of  blood after the procedure. Husband reprots she was eating soft foods, but appetite was not great.    Assessment / Plan / Recommendation Clinical Impression  Pt demonstrates concern for mild language or motor planning based deficits. Pt struggled to comprehend complex language with novel tasks, needed repetition of multistep directions, but otherwise prefomed well with cognitive skills such as memory, attention, visuospatial tasks, awareness and mathmatical reasoning. Motor planning or phonemic errors noted 3x in repetition/conversation. Pt mostly relys on her husband to communicate with providers. COuld not further examin language deficits due to MD arrival, however, will f/u for further diagnostic assessment and treatment of speech and language.     SLP Assessment  SLP Recommendation/Assessment: Patient needs continued Speech Lanaguage Pathology Services SLP Visit Diagnosis: Aphasia (R47.01);Apraxia (R48.2)    Follow Up Recommendations       Frequency and Duration min 2x/week  2 weeks      SLP Evaluation Cognition  Overall Cognitive Status: Within Functional Limits for tasks assessed Orientation Level: Oriented X4 Attention: Alternating Alternating Attention: Appears intact Memory: Appears intact Awareness: Appears intact Problem Solving: Impaired Problem Solving Impairment: Verbal complex Safety/Judgment: Appears intact       Comprehension  Auditory Comprehension Overall Auditory Comprehension: Impaired Yes/No Questions: Not tested Commands: Within Functional Limits Conversation: Simple EffectiveTechniques: Repetition    Expression Verbal Expression Overall Verbal Expression: Impaired Initiation: No impairment Automatic Speech: Name;Social Response Level of Generative/Spontaneous Verbalization: Phrase Repetition: Impaired Level of Impairment: Phrase level Naming: Not tested Pragmatics: No impairment Written Expression Dominant Hand: (ambidextrious, uses right for  handwriting)   Oral / Motor  Oral Motor/Sensory Function Overall Oral Motor/Sensory Function: Within functional limits Motor Speech Overall Motor Speech: Appears within functional limits for tasks assessed Respiration: Within functional limits Phonation: Normal Resonance: Within functional limits Articulation: Within functional limitis Intelligibility: Intelligible Motor Planning: Impaired Level of Impairment: Phrase   GO                   [employer], MA CCC-SLP [phone]  Lynann Beaver 11/14/2017, 12:01 PM

## 2017-11-14 NOTE — Procedures (Signed)
Attempted echo at 3pm, but patient was eating.  Came to room again at 4pm, but patient was in MRI.

## 2017-11-14 NOTE — Anesthesia Postprocedure Evaluation (Signed)
Anesthesia Post Note  Patient: Teresa York  Procedure(s) Performed: IR WITH ANESTHESIA (N/A )     Patient location during evaluation: SICU Anesthesia Type: General Level of consciousness: sedated Pain management: pain level controlled Vital Signs Assessment: post-procedure vital signs reviewed and stable Respiratory status: patient remains intubated per anesthesia plan Cardiovascular status: stable Postop Assessment: no apparent nausea or vomiting Anesthetic complications: no    Last Vitals:  Vitals:   11/14/17 0330 11/14/17 0417  BP: 112/68   Pulse: 86   Resp: 19   Temp:  36.7 C  SpO2: 97%     Last Pain:  Vitals:   11/14/17 0417  TempSrc: Oral  PainSc:                  Dorwin Fitzhenry DANIEL

## 2017-11-14 NOTE — Evaluation (Signed)
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 PMHx: Afib, HTN, CHF, EF 30-50%, chronic LBP  Clinical Impression  Pt pleasant with flat affect, slow response and fatigue with gait. Pt reports having been increasingly weaker PTA after admission at [facility] and receiving HHPT. Pt will benefit from acute therapy to maximize mobility, function, strength and activity tolerance to decrease burden of care. Recommend daily mobility with nursing assist.      Follow Up Recommendations Home health PT;Supervision/Assistance - 24 hour    Equipment Recommendations  None recommended by PT    Recommendations for Other Services       Precautions / Restrictions Precautions Precautions: Fall Restrictions Weight Bearing Restrictions: No      Mobility  Bed Mobility Overal bed mobility: Needs Assistance Bed Mobility: Supine to Sit     Supine to sit: Min assist     General bed mobility comments: min assist to fully elevate trunk with increased time  Transfers Overall transfer level: Needs assistance   Transfers: Sit to/from Stand Sit to Stand: Min guard         General transfer comment: cues for hand placement and safety rising from bed and toilet with rail   Ambulation/Gait Ambulation/Gait assistance: Min guard Ambulation Distance (Feet): 120 Feet Assistive device: Rolling walker (2 wheeled) Gait Pattern/deviations: Step-through pattern;Decreased stride length;Trunk flexed   Gait velocity interpretation: Below normal speed for age/gender General Gait Details: pt with slow steady gait, pt not self-regulating and maintained walking despite cues from therapist for needing to return to room, required chair pulled to her  Stairs            Wheelchair Mobility    Modified Rankin (Stroke Patients  Only) Modified Rankin (Stroke Patients Only) Pre-Morbid Rankin Score: No significant disability Modified Rankin: Moderate disability     Balance Overall balance assessment: Needs assistance   Sitting balance-Leahy Scale: Fair       Standing balance-Leahy Scale: Poor                               Pertinent Vitals/Pain Pain Assessment: No/denies pain    Home Living Family/patient expects to be discharged to:: Private residence Living Arrangements: Spouse/significant other Available Help at Discharge: Family;Available 24 hours/day Type of Home: House Home Access: Stairs to enter   [employer] of Steps: 4 Home Layout: Multi-level Home Equipment: Walker - 2 wheels;Cane - single point;Shower seat;Hand held Tourist information centre manager - 4 wheels Additional Comments: pt receiving HHPT, OT, RN grossly 1 week    Prior Function Level of Independence: Independent               Hand Dominance        Extremity/Trunk Assessment   Upper Extremity Assessment Upper Extremity Assessment: Defer to OT evaluation    Lower Extremity Assessment Lower Extremity Assessment: Generalized weakness;Overall [facility] for tasks assessed    Cervical / Trunk Assessment Cervical / Trunk Assessment: Kyphotic  Communication   Communication: No difficulties  Cognition Arousal/Alertness: Awake/alert Behavior During Therapy: Flat affect Overall Cognitive Status: Impaired/Different from baseline Area of Impairment: Following commands;Problem solving;Orientation                 Orientation Level: Situation     Following Commands: Follows one step commands consistently;Follows one step commands  with increased time     Problem Solving: Slow processing        General Comments      Exercises     Assessment/Plan    PT Assessment Patient needs continued PT services  PT Problem List Decreased strength;Decreased mobility;Decreased safety awareness;Decreased activity  tolerance;Decreased balance;Decreased knowledge of use of DME;Cardiopulmonary status limiting activity;Decreased cognition       PT Treatment Interventions Gait training;Therapeutic exercise;Patient/family education;Stair training;Functional mobility training;Balance training;DME instruction;Therapeutic activities;Cognitive remediation    PT Goals (Current goals can be found in the Care Plan section)  Acute Rehab PT Goals Patient Stated Goal: return home PT Goal Formulation: With patient/family Time For Goal Achievement: 11/28/17 Potential to Achieve Goals: Fair    Frequency Min 3X/week   Barriers to discharge        Co-evaluation PT/OT/SLP Co-Evaluation/Treatment: Yes Reason for Co-Treatment: For patient/therapist safety PT goals addressed during session: Mobility/safety with mobility;Balance;Proper use of DME         AM-PAC PT "6 Clicks" Daily Activity  Outcome Measure Difficulty turning over in bed (including adjusting bedclothes, sheets and blankets)?: A Little Difficulty moving from lying on back to sitting on the side of the bed? : Unable Difficulty sitting down on and standing up from a chair with arms (e.g., wheelchair, bedside commode, etc,.)?: A Little Help needed moving to and from a bed to chair (including a wheelchair)?: A Little Help needed walking in hospital room?: A Little Help needed climbing 3-5 steps with a railing? : A Lot 6 Click Score: 15    End of Session Equipment Utilized During Treatment: Gait belt Activity Tolerance: Patient tolerated treatment well Patient left: in chair;with call bell/phone within reach;with chair alarm set;with family/visitor present Nurse Communication: Mobility status PT Visit Diagnosis: Other abnormalities of gait and mobility (R26.89);Muscle weakness (generalized) (M62.81)    Time: 8676-1950 PT Time Calculation (min) (ACUTE ONLY): 31 min   Charges:   PT Evaluation $PT Eval Moderate Complexity: 1 Mod     PT G  Codes:        Elwyn Reach, PT [phone]   Gran[place] B Tommye Lehenbauer 11/14/2017, 10:53 AM

## 2017-11-15 ENCOUNTER — Inpatient Hospital Stay (HOSPITAL_COMMUNITY): Payer: Medicare HMO

## 2017-11-15 ENCOUNTER — Encounter (HOSPITAL_COMMUNITY): Payer: Self-pay | Admitting: Interventional Radiology

## 2017-11-15 DIAGNOSIS — I361 Nonrheumatic tricuspid (valve) insufficiency: Secondary | ICD-10-CM

## 2017-11-15 LAB — BASIC METABOLIC PANEL
Anion gap: 13 (ref 5–15)
BUN: 9 mg/dL (ref 6–20)
CO2: 21 mmol/L — ABNORMAL LOW (ref 22–32)
CREATININE: 1.5 mg/dL — AB (ref 0.44–1.00)
Calcium: 8.4 mg/dL — ABNORMAL LOW (ref 8.9–10.3)
Chloride: 105 mmol/L (ref 101–111)
GFR calc Af Amer: 37 mL/min — ABNORMAL LOW (ref >60)
GFR, EST NON AFRICAN AMERICAN: 32 mL/min — AB (ref >60)
Glucose, Bld: 83 mg/dL (ref 65–99)
POTASSIUM: 3.2 mmol/L — AB (ref 3.5–5.1)
SODIUM: 139 mmol/L (ref 135–145)

## 2017-11-15 LAB — ECHOCARDIOGRAM COMPLETE
AVPHT: 405 ms
Ao-asc: 27 cm
CHL CUP MV DEC (S): 165
CHL CUP TV REG PEAK VELOCITY: 309 cm/s
EWDT: 165 ms
FS: 36 % (ref 28–44)
Height: 62 in
IVS/LV PW RATIO, ED: 0.9
LA ID, A-P, ES: 48 mm
LA diam index: 2.3 cm/m2
LA vol A4C: 98.7 ml
LA vol index: 44.4 mL/m2
LA vol: 92.4 mL
LDCA: 2.54 cm2
LEFT ATRIUM END SYS DIAM: 48 mm
LVOTD: 18 mm
MV Peak grad: 4 mmHg
MV pk A vel: 50.4 m/s
MV pk E vel: 104 m/s
MVAP: 4.58 cm2
MVSPHT: 48 ms
PV Reg grad dias: 15 mmHg
PV Reg vel dias: 193 cm/s
PW: 12.7 mm — AB (ref 0.6–1.1)
RV TAPSE: 17 mm
RV sys press: 53 mmHg
TR max vel: 309 cm/s
Weight: 3336.88 oz

## 2017-11-15 LAB — PHOSPHORUS: Phosphorus: 3.2 mg/dL (ref 2.5–4.6)

## 2017-11-15 LAB — CBC
HCT: 28.7 % — ABNORMAL LOW (ref 36.0–46.0)
Hemoglobin: 8.9 g/dL — ABNORMAL LOW (ref 12.0–15.0)
MCH: 28.7 pg (ref 26.0–34.0)
MCHC: 31 g/dL (ref 30.0–36.0)
MCV: 92.6 fL (ref 78.0–100.0)
PLATELETS: 278 10*3/uL (ref 150–400)
RBC: 3.1 MIL/uL — AB (ref 3.87–5.11)
RDW: 18.2 % — ABNORMAL HIGH (ref 11.5–15.5)
WBC: 6.9 10*3/uL (ref 4.0–10.5)

## 2017-11-15 LAB — MAGNESIUM: MAGNESIUM: 1.9 mg/dL (ref 1.7–2.4)

## 2017-11-15 MED ORDER — ATORVASTATIN CALCIUM 20 MG PO TABS: 20.0000 mg | ORAL_TABLET | Freq: Every day | ORAL | 1 refills | Status: DC

## 2017-11-15 MED ORDER — APIXABAN 5 MG PO TABS
5.0000 mg | ORAL_TABLET | Freq: Two times a day (BID) | ORAL | Status: DC
  Administered 2017-11-15: 5 mg via ORAL
  Filled 2017-11-15: qty 1

## 2017-11-15 MED ORDER — AMIODARONE HCL 200 MG PO TABS: 200.0000 mg | ORAL_TABLET | Freq: Every day | ORAL | 1 refills | Status: DC

## 2017-11-15 MED ORDER — ASPIRIN 81 MG PO TBEC: 81.0000 mg | DELAYED_RELEASE_TABLET | Freq: Every day | ORAL | 1 refills | Status: DC

## 2017-11-15 MED ORDER — APIXABAN 5 MG PO TABS: 5.0000 mg | ORAL_TABLET | Freq: Two times a day (BID) | ORAL | 1 refills | Status: DC

## 2017-11-15 MED ORDER — ASPIRIN EC 81 MG PO TBEC
81.0000 mg | DELAYED_RELEASE_TABLET | Freq: Every day | ORAL | Status: DC
  Administered 2017-11-15: 81 mg via ORAL
  Filled 2017-11-15: qty 1

## 2017-11-15 MED ORDER — AMIODARONE HCL 100 MG PO TABS: 100.0000 mg | ORAL_TABLET | Freq: Every day | ORAL | 1 refills | Status: DC

## 2017-11-15 MED ORDER — POTASSIUM CHLORIDE CRYS ER 20 MEQ PO TBCR: 20.0000 meq | EXTENDED_RELEASE_TABLET | Freq: Two times a day (BID) | ORAL | 3 refills | Status: DC

## 2017-11-15 MED ORDER — AMIODARONE HCL 200 MG PO TABS
300.0000 mg | ORAL_TABLET | Freq: Every day | ORAL | Status: DC
  Administered 2017-11-15: 300 mg via ORAL
  Filled 2017-11-15: qty 2

## 2017-11-15 MED ORDER — POTASSIUM CHLORIDE 10 MEQ/100ML IV SOLN
10.0000 meq | INTRAVENOUS | Status: AC
  Administered 2017-11-15 (×4): 10 meq via INTRAVENOUS
  Filled 2017-11-15 (×3): qty 100

## 2017-11-15 MED ORDER — FUROSEMIDE 40 MG PO TABS: 40.0000 mg | ORAL_TABLET | Freq: Two times a day (BID) | ORAL | 7 refills | Status: DC

## 2017-11-15 NOTE — Discharge Instructions (Signed)
Follow up with PCP next week for labs to check electrolytes and monitor CBC for anemia

## 2017-11-15 NOTE — Progress Notes (Signed)
Progress Note  Patient Name: Teresa York Date of Encounter: 11/15/2017  Primary Cardiologist: Julianne Handler   Subjective   No complaints Sitting in chair speech better   Inpatient Medications    Scheduled Meds: .  stroke: mapping our early stages of recovery book   Does not apply Once  . amiodarone  300 mg Oral Daily  . aspirin  325 mg Oral Daily  . atorvastatin  20 mg Oral q1800  . ferrous sulfate  325 mg Oral Daily  . FLUoxetine  40 mg Oral Daily  . furosemide  40 mg Oral BID  . heparin  5,000 Units Subcutaneous Q8H  . levothyroxine  225 mcg Oral QAC breakfast  . metoprolol tartrate  25 mg Oral BID  . polyethylene glycol  17 g Oral QPC supper  . potassium chloride SA  20 mEq Oral BID  . senna-docusate  2 tablet Oral QHS   Continuous Infusions: . sodium chloride Stopped (11/14/17 2213)  . potassium chloride 10 mEq (11/15/17 0743)   PRN Meds: acetaminophen **OR** acetaminophen (TYLENOL) oral liquid 160 mg/5 mL **OR** acetaminophen   Vital Signs    Vitals:   11/15/17 0300 11/15/17 0400 11/15/17 0500 11/15/17 0600  BP: (!) 139/42 (!) 122/50 (!) 127/41 (!) 131/53  Pulse: (!) 52 (!) 51 (!) 50 (!) 52  Resp: (!) 25 13 16  (!) 21  Temp: 98.6 F (37 C)     TempSrc: Oral     SpO2: 91% 97% 97% 92%  Weight:      Height:        Intake/Output Summary (Last 24 hours) at 11/15/2017 0829 Last data filed at 11/15/2017 0600 Gross per 24 hour  Intake 1384.25 ml  Output 750 ml  Net 634.25 ml   Filed Weights   11/13/17 2200 11/14/17 0315  Weight: 208 lb 8.9 oz (94.6 kg) 208 lb 8.9 oz (94.6 kg)    Telemetry    NSR this am  - Personally Reviewed  ECG    NSR no acute changes  - Personally Reviewed  Physical Exam  Pale elderly female  GEN: No acute distress.   Neck: No JVD Cardiac: RRR, SEM AR/MR murmurs, rubs, or gallops.  Respiratory: Clear to auscultation bilaterally. GI: Soft, nontender, non-distended  MS: No edema; No deformity. Neuro: Aphasia improved LLE weakness    Psych: Normal affect   Labs    Chemistry Recent Labs  Lab 11/13/17 2207 11/13/17 2211 11/14/17 0500 11/14/17 1456 11/15/17 0427  NA 142 143 141  --  139  K 2.8* 2.8* 2.6* 3.0* 3.2*  CL 106 103 105  --  105  CO2 24  --  23  --  21*  GLUCOSE 102* 99 100*  --  83  BUN 8 9 8   --  9  CREATININE 1.32* 1.30* 1.23*  --  1.50*  CALCIUM 8.9  --  8.0*  --  8.4*  PROT 6.4*  --   --   --   --   ALBUMIN 2.8*  --   --   --   --   AST 39  --   --   --   --   ALT 29  --   --   --   --   ALKPHOS 73  --   --   --   --   BILITOT 0.8  --   --   --   --   GFRNONAA 38*  --  41*  --  32*  GFRAA 44*  --  67*  --  37*  ANIONGAP 12  --  13  --  13     Hematology Recent Labs  Lab 11/13/17 2207 11/13/17 2211 11/14/17 0500 11/15/17 0427  WBC 8.0  --  6.6 6.9  RBC 3.66*  --  3.10* 3.10*  HGB 10.3* 10.9* 9.0* 8.9*  HCT 33.7* 32.0* 28.4* 28.7*  MCV 92.1  --  91.6 92.6  MCH 28.1  --  29.0 28.7  MCHC 30.6  --  31.7 31.0  RDW 17.8*  --  17.7* 18.2*  PLT 310  --  280 278    Cardiac EnzymesNo results for input(s): TROPONINI in the last 168 hours.  Recent Labs  Lab 11/13/17 2209  TROPIPOC 0.01     BNPNo results for input(s): BNP, PROBNP in the last 168 hours.   DDimer No results for input(s): DDIMER in the last 168 hours.   Radiology    Ct Angio Head W Or Wo Contrast  Result Date: 11/13/2017 CLINICAL DATA:  77 y/o  F; left facial droop and dysphagia. EXAM: CT ANGIOGRAPHY HEAD AND NECK CT PERFUSION BRAIN TECHNIQUE: Multidetector CT imaging of the head and neck was performed using the standard protocol during bolus administration of intravenous contrast. Multiplanar CT image reconstructions and MIPs were obtained to evaluate the vascular anatomy. Carotid stenosis measurements (when applicable) are obtained utilizing NASCET criteria, using the distal internal carotid diameter as the denominator. Multiphase CT imaging of the brain was performed following IV bolus contrast injection. Subsequent  parametric perfusion maps were calculated using RAPID software. CONTRAST:  156mL ISOVUE-370 IOPAMIDOL (ISOVUE-370) INJECTION 76% COMPARISON:  None. FINDINGS: CTA NECK FINDINGS Aortic arch: Bovine variant branching. Imaged portion shows no evidence of aneurysm or dissection. No significant stenosis of the major arch vessel origins. Moderate calcific atherosclerosis. Right carotid system: Dense calcified plaque of the right carotid bifurcation with moderate 50-70% proximal ICA stenosis. Left carotid system: Dense calcified plaque of the left carotid bifurcation with mild to moderate 50% proximal ICA stenosis. Severe stenosis of the left external carotid artery origin. Vertebral arteries: Left dominant. No evidence of dissection, stenosis (50% or greater) or occlusion. Skeleton: Mild cervical spondylosis. No high-grade bony canal stenosis. Other neck: Patulous esophagus. Upper chest: Ground-glass opacities within the lung apices probably representing pulmonary edema, possibly neurogenic given stroke. Small bilateral effusions. Review of the MIP images confirms the above findings CTA HEAD FINDINGS Anterior circulation: Right proximal M1 occlusion. Poor right MCA distribution collateralization. Calcific atherosclerosis of carotid siphons with mild less than 50% bilateral paraclinoid stenosis. Patent bilateral anterior cerebral arteries and left middle cerebral artery. Posterior circulation: Bilateral V4 short segments of dense calcified plaque with severe greater than 70% right and moderate 50-70% left stenosis. Patent basilar and posterior cerebral arteries. Left P1 2 junction mild stenosis. Venous sinuses: As permitted by contrast timing, patent. Anatomic variants: Anterior communicating artery and right posterior communicating artery identified. No left posterior communicating artery identified, likely hypoplastic or absent. Delayed phase: No abnormal intracranial enhancement. Review of the MIP images confirms the  above findings CT Brain Perfusion Findings: CBF (<30%) Volume: 54mL Perfusion (Tmax>6.0s) volume: 145mL Mismatch Volume: 154mL Infarction Location:Right MCA distribution IMPRESSION: CT brain perfusion: 1. Core infarct 28 cc, right MCA distribution. 2. Ischemic penumbra 129 cc. CTA head: 1. Right M1 proximal occlusion. Poor right MCA distribution collateralization. 2. Otherwise patent anterior and posterior circulations without aneurysm or occlusion. 3. Bilateral ICA paraclinoid and left P1/2 mild stenosis. 4. Bilateral V4 calcified  plaque with severe right and moderate left stenosis. CTA neck: 1. Right proximal ICA moderate 50-70% stenosis with dense calcified plaque. 2. Left proximal ICA mild to moderate 50% stenosis with dense calcified plaque. 3. No significant stenosis of vertebral arteries. 4. No dissection or aneurysm of carotid or vertebral arteries in the neck. 5. Ground-glass opacities in lung apices, probably pulmonary edema, possibly neurogenic given stroke. Small effusions. These results were called by telephone at the time of interpretation on 11/13/2017 at 10:42 pm to Dr. Amie Portland , who verbally acknowledged these results. Electronically Signed   By: Kristine Garbe M.D.   On: 11/13/2017 22:56   Ct Head Wo Contrast  Result Date: 11/14/2017 CLINICAL DATA:  Follow-up examination status post arteriogram. EXAM: CT HEAD WITHOUT CONTRAST TECHNIQUE: Contiguous axial images were obtained from the base of the skull through the vertex without intravenous contrast. COMPARISON:  Prior studies from 11/13/2017. FINDINGS: Brain: Atrophy with chronic microvascular ischemic disease. No acute intracranial hemorrhage. Subtle asymmetric hypodensity within the anterior right temporal lobe suspicious for evolving ischemia (series 3, image 12). No significant mass effect. No other acute or evolving large vessel territory infarct. No mass lesion or midline shift. No hydrocephalus. No extra-axial fluid  collection. Vascular: Diffuse hyperdensity throughout the intracranial circulation, likely reflecting contrast material. Intracranial atherosclerosis again noted. Skull: And calvarium within normal limits.  Scalp soft tissues Sinuses/Orbits: Globes and orbital soft tissues within normal limits. Paranasal sinuses and mastoid air cells are clear. Other: None. IMPRESSION: 1. No acute intracranial hemorrhage or other complication identified status post catheter directed revascularization. 2. Hypodensity within the anterior right temporal lobe, consistent with evolving right MCA territory infarct. No associated mass effect. 3. Otherwise stable appearance of the brain. No other acute intracranial abnormality. Electronically Signed   By: Jeannine Boga M.D.   On: 11/14/2017 05:07   Ct Angio Neck W Or Wo Contrast  Result Date: 11/13/2017 CLINICAL DATA:  77 y/o  F; left facial droop and dysphagia. EXAM: CT ANGIOGRAPHY HEAD AND NECK CT PERFUSION BRAIN TECHNIQUE: Multidetector CT imaging of the head and neck was performed using the standard protocol during bolus administration of intravenous contrast. Multiplanar CT image reconstructions and MIPs were obtained to evaluate the vascular anatomy. Carotid stenosis measurements (when applicable) are obtained utilizing NASCET criteria, using the distal internal carotid diameter as the denominator. Multiphase CT imaging of the brain was performed following IV bolus contrast injection. Subsequent parametric perfusion maps were calculated using RAPID software. CONTRAST:  122mL ISOVUE-370 IOPAMIDOL (ISOVUE-370) INJECTION 76% COMPARISON:  None. FINDINGS: CTA NECK FINDINGS Aortic arch: Bovine variant branching. Imaged portion shows no evidence of aneurysm or dissection. No significant stenosis of the major arch vessel origins. Moderate calcific atherosclerosis. Right carotid system: Dense calcified plaque of the right carotid bifurcation with moderate 50-70% proximal ICA  stenosis. Left carotid system: Dense calcified plaque of the left carotid bifurcation with mild to moderate 50% proximal ICA stenosis. Severe stenosis of the left external carotid artery origin. Vertebral arteries: Left dominant. No evidence of dissection, stenosis (50% or greater) or occlusion. Skeleton: Mild cervical spondylosis. No high-grade bony canal stenosis. Other neck: Patulous esophagus. Upper chest: Ground-glass opacities within the lung apices probably representing pulmonary edema, possibly neurogenic given stroke. Small bilateral effusions. Review of the MIP images confirms the above findings CTA HEAD FINDINGS Anterior circulation: Right proximal M1 occlusion. Poor right MCA distribution collateralization. Calcific atherosclerosis of carotid siphons with mild less than 50% bilateral paraclinoid stenosis. Patent bilateral anterior cerebral arteries and left  middle cerebral artery. Posterior circulation: Bilateral V4 short segments of dense calcified plaque with severe greater than 70% right and moderate 50-70% left stenosis. Patent basilar and posterior cerebral arteries. Left P1 2 junction mild stenosis. Venous sinuses: As permitted by contrast timing, patent. Anatomic variants: Anterior communicating artery and right posterior communicating artery identified. No left posterior communicating artery identified, likely hypoplastic or absent. Delayed phase: No abnormal intracranial enhancement. Review of the MIP images confirms the above findings CT Brain Perfusion Findings: CBF (<30%) Volume: 19mL Perfusion (Tmax>6.0s) volume: 113mL Mismatch Volume: 184mL Infarction Location:Right MCA distribution IMPRESSION: CT brain perfusion: 1. Core infarct 28 cc, right MCA distribution. 2. Ischemic penumbra 129 cc. CTA head: 1. Right M1 proximal occlusion. Poor right MCA distribution collateralization. 2. Otherwise patent anterior and posterior circulations without aneurysm or occlusion. 3. Bilateral ICA paraclinoid  and left P1/2 mild stenosis. 4. Bilateral V4 calcified plaque with severe right and moderate left stenosis. CTA neck: 1. Right proximal ICA moderate 50-70% stenosis with dense calcified plaque. 2. Left proximal ICA mild to moderate 50% stenosis with dense calcified plaque. 3. No significant stenosis of vertebral arteries. 4. No dissection or aneurysm of carotid or vertebral arteries in the neck. 5. Ground-glass opacities in lung apices, probably pulmonary edema, possibly neurogenic given stroke. Small effusions. These results were called by telephone at the time of interpretation on 11/13/2017 at 10:42 pm to Dr. Amie Portland , who verbally acknowledged these results. Electronically Signed   By: Kristine Garbe M.D.   On: 11/13/2017 22:56   Mr Brain Wo Contrast  Result Date: 11/14/2017 CLINICAL DATA:  History of atrial fibrillation. Acute onset speech difficulty. EXAM: MRI HEAD WITHOUT CONTRAST TECHNIQUE: Multiplanar, multiecho pulse sequences of the brain and surrounding structures were obtained without intravenous contrast. COMPARISON:  Head CT 11/14/2017 FINDINGS: Brain: The midline structures are normal. There is multifocal abnormal diffusion restriction within the right MCA territory including the right caudate and lentiform nuclei and the cortex of the right temporal lobe. No midline shift or mass effect. No other site of abnormal diffusion restriction. No mass lesion, hydrocephalus, dural abnormality or extra-axial collection. Minimal periventricular white matter hyperintensity. No age-advanced or lobar predominant atrophy. No chronic microhemorrhage or superficial siderosis. Vascular: Major intracranial arterial and venous sinus flow voids are preserved. Skull and upper cervical spine: The visualized skull base, calvarium, upper cervical spine and extracranial soft tissues are normal. Sinuses/Orbits: No fluid levels or advanced mucosal thickening. No mastoid or middle ear effusion. Normal orbits.  IMPRESSION: Nonhemorrhagic acute infarct within the right MCA territory, involving the right caudate nucleus, right lentiform nucleus and the anterior right temporal lobe cortex. No associated mass effect. Electronically Signed   By: Ulyses Jarred M.D.   On: 11/14/2017 16:20   Ct Cerebral Perfusion W Contrast  Result Date: 11/13/2017 CLINICAL DATA:  77 y/o  F; left facial droop and dysphagia. EXAM: CT ANGIOGRAPHY HEAD AND NECK CT PERFUSION BRAIN TECHNIQUE: Multidetector CT imaging of the head and neck was performed using the standard protocol during bolus administration of intravenous contrast. Multiplanar CT image reconstructions and MIPs were obtained to evaluate the vascular anatomy. Carotid stenosis measurements (when applicable) are obtained utilizing NASCET criteria, using the distal internal carotid diameter as the denominator. Multiphase CT imaging of the brain was performed following IV bolus contrast injection. Subsequent parametric perfusion maps were calculated using RAPID software. CONTRAST:  174mL ISOVUE-370 IOPAMIDOL (ISOVUE-370) INJECTION 76% COMPARISON:  None. FINDINGS: CTA NECK FINDINGS Aortic arch: Bovine variant branching. Imaged portion shows  no evidence of aneurysm or dissection. No significant stenosis of the major arch vessel origins. Moderate calcific atherosclerosis. Right carotid system: Dense calcified plaque of the right carotid bifurcation with moderate 50-70% proximal ICA stenosis. Left carotid system: Dense calcified plaque of the left carotid bifurcation with mild to moderate 50% proximal ICA stenosis. Severe stenosis of the left external carotid artery origin. Vertebral arteries: Left dominant. No evidence of dissection, stenosis (50% or greater) or occlusion. Skeleton: Mild cervical spondylosis. No high-grade bony canal stenosis. Other neck: Patulous esophagus. Upper chest: Ground-glass opacities within the lung apices probably representing pulmonary edema, possibly neurogenic  given stroke. Small bilateral effusions. Review of the MIP images confirms the above findings CTA HEAD FINDINGS Anterior circulation: Right proximal M1 occlusion. Poor right MCA distribution collateralization. Calcific atherosclerosis of carotid siphons with mild less than 50% bilateral paraclinoid stenosis. Patent bilateral anterior cerebral arteries and left middle cerebral artery. Posterior circulation: Bilateral V4 short segments of dense calcified plaque with severe greater than 70% right and moderate 50-70% left stenosis. Patent basilar and posterior cerebral arteries. Left P1 2 junction mild stenosis. Venous sinuses: As permitted by contrast timing, patent. Anatomic variants: Anterior communicating artery and right posterior communicating artery identified. No left posterior communicating artery identified, likely hypoplastic or absent. Delayed phase: No abnormal intracranial enhancement. Review of the MIP images confirms the above findings CT Brain Perfusion Findings: CBF (<30%) Volume: 69mL Perfusion (Tmax>6.0s) volume: 168mL Mismatch Volume: 174mL Infarction Location:Right MCA distribution IMPRESSION: CT brain perfusion: 1. Core infarct 28 cc, right MCA distribution. 2. Ischemic penumbra 129 cc. CTA head: 1. Right M1 proximal occlusion. Poor right MCA distribution collateralization. 2. Otherwise patent anterior and posterior circulations without aneurysm or occlusion. 3. Bilateral ICA paraclinoid and left P1/2 mild stenosis. 4. Bilateral V4 calcified plaque with severe right and moderate left stenosis. CTA neck: 1. Right proximal ICA moderate 50-70% stenosis with dense calcified plaque. 2. Left proximal ICA mild to moderate 50% stenosis with dense calcified plaque. 3. No significant stenosis of vertebral arteries. 4. No dissection or aneurysm of carotid or vertebral arteries in the neck. 5. Ground-glass opacities in lung apices, probably pulmonary edema, possibly neurogenic given stroke. Small effusions.  These results were called by telephone at the time of interpretation on 11/13/2017 at 10:42 pm to Dr. Amie Portland , who verbally acknowledged these results. Electronically Signed   By: Kristine Garbe M.D.   On: 11/13/2017 22:56   Dg Chest Port 1 View  Result Date: 11/14/2017 CLINICAL DATA:  Pneumonia.  Hypertension and atrial fibrillation. EXAM: PORTABLE CHEST 1 VIEW COMPARISON:  11/03/2017 FINDINGS: The heart is enlarged but appears stable. Stable tortuosity and calcification of the thoracic aorta. Vascular congestion and diffuse interstitial edema and probable small right pleural effusion. Findings suggest CHF. IMPRESSION: CHF. Electronically Signed   By: Marijo Sanes M.D.   On: 11/14/2017 08:43   Ct Head Code Stroke Wo Contrast  Result Date: 11/13/2017 CLINICAL DATA:  Code stroke.  77 y/o  F; left-sided facial droop. EXAM: CT HEAD WITHOUT CONTRAST TECHNIQUE: Contiguous axial images were obtained from the base of the skull through the vertex without intravenous contrast. COMPARISON:  None. FINDINGS: Brain: Hypoattenuation is present within the right temporal lobe, insula, putamen, and caudate body with loss of gray-white differentiation likely representing an acute infarction. Aspects is 6. No acute hemorrhage. No significant mass effect. No other stroke, focal mass effect, or brain parenchymal lesion identified. Vascular: Right dense M1 likely thrombus. Skull: Normal. Negative for fracture or focal  lesion. Sinuses/Orbits: No acute finding. Other: None. ASPECTS [facility] Stroke Program Early CT Score) - Ganglionic level infarction (caudate, lentiform nuclei, internal capsule, insula, M1-M3 cortex): 3 - Supraganglionic infarction (M4-M6 cortex): 3 Total score (0-10 with 10 being normal): 6 IMPRESSION: 1. Right MCA distribution hypoattenuation with loss of gray-white differentiation compatible with acute infarct. No hemorrhage or mass effect. 2. ASPECTS is 6 3. Right M1 dense vessel, likely thrombus.  These results were called by telephone at the time of interpretation on 11/13/2017 at 10:22 pm to Dr. Amie Portland , who verbally acknowledged these results. Electronically Signed   By: Kristine Garbe M.D.   On: 11/13/2017 22:27    Cardiac Studies   Echo pending     Assessment & Plan    AFib:  converted to NSR continue eliquis 5 bid   CHF:  Last echo/TEE 11/16/16 EF moderately decreased with EF 30% moderate AR and moderate to severe MR  She has received fluids for her stoke and interventions and is over a liter positive today. Good diuresis with lasix Patient indicates urinating all night despite recorded I/O' being positive Will give another dose today follow K    For questions or updates, please contact [place] Please consult [email or web address] for contact info under Cardiology/STEMI.      Signed, Jenkins Rouge, MD  11/15/2017, 8:29 AM

## 2017-11-15 NOTE — Care Management Note (Signed)
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   PTA, pt independent, lives at home with spouse.                    Action/Plan: Pt medically stable for dc home today with spouse.  Pt active with [facility] services PTA; wishes to resume [place] services upon dc.  Orders obtained from provider.  Pt requests BSC for home; referral to [facility] for 3 in 1 BSC.  Pt started on Eliquis this admission; provided pt with Eliquis 30 day trial card for free 1st Rx of med.  Pt/ husband appreciative of help.    Expected Discharge Date:  11/15/17               Expected Discharge Plan:  [place]  In-House Referral:     Discharge planning Services  CM Consult, Medication Assistance  Post Acute Care Choice:  Home Health, Resumption of Svcs/PTA Provider Choice offered to:  Patient, Spouse  DME Arranged:  3-N-1 DME Agency:  [place] Arranged:  RN, PT, OT [facility] Agency:  [place]  Status of Service:  Completed, signed off  If discussed at [place] of Stay Meetings, dates discussed:    Additional Comments:  Reinaldo Raddle, RN, BSN  Trauma/Neuro ICU Case Manager [phone]

## 2017-11-15 NOTE — Discharge Summary (Signed)
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 Allergic rhinitis   . Anxiety   . Atrial fibrillation ([facility])   . Barrett's esophagus   . Breast cancer ([place]) 2005   "left"  . Chronic lower back pain   . Chronic systolic CHF (congestive heart failure) (HCC)    a. EF 30-35% felt to be possibly be tachy mediated from afib wtih RVR  . Depression   . Diverticulosis   . GERD (gastroesophageal reflux disease)   . Hiatal hernia   . Hypertension   . Hypothyroidism   . Left atrial enlargement   . Mitral regurgitation   . OAB (overactive bladder)   . Persistent atrial fibrillation ([place]) 10/31/2016   a. s/p failed DCCV x3. Now on amiodarone.   . 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)   Past Surgical History:  Procedure Laterality Date  . BACK SURGERY    . BREAST BIOPSY Left 2005  . BREAST LUMPECTOMY Left 2005  . CARDIOVERSION N/A 11/02/2016   Procedure: CARDIOVERSION;  Surgeon: Thayer Headings, MD;  Location: [facility];  Service: Cardiovascular;  Laterality: N/A;  . CARDIOVERSION N/A 11/16/2016   Procedure: CARDIOVERSION;  Surgeon:  Fay Records, MD;  Location: [place];  Service: Cardiovascular;  Laterality: N/A;  . CARDIOVERSION N/A 12/24/2016   Procedure: CARDIOVERSION;  Surgeon: Sanda Klein, MD;  Location: [facility];  Service: Cardiovascular;  Laterality: N/A;  . CARPAL TUNNEL RELEASE Bilateral 1995  . COLONOSCOPY    . ELBOW FRACTURE SURGERY Right 2009   with implant  . ESOPHAGOGASTRODUODENOSCOPY    . ESOPHAGUS SURGERY     "in the process of getting cancerous cell off my esophagus" (10/31/2016)  . FRACTURE SURGERY    . LUMBAR DISC SURGERY     L5  . MOHS SURGERY     "corner of right eye; on left cheek"  . RADIOLOGY WITH ANESTHESIA N/A 11/13/2017   Procedure: IR WITH ANESTHESIA;  Surgeon: Radiologist, Medication, MD;  Location: [place];  Service: Radiology;  Laterality: N/A;  . TEE WITHOUT CARDIOVERSION N/A 11/16/2016   Procedure: TRANSESOPHAGEAL ECHOCARDIOGRAM (TEE);  Surgeon: Fay Records, MD;  Location: [place];  Service: Cardiovascular;  Laterality: N/A;  . TONSILLECTOMY    . TUBAL LIGATION  1980    Allergies as of 11/15/2017      Reactions   Benazepril Cough   Diltiazem Hcl Rash      Medication List    STOP taking these medications   rivaroxaban 20 MG Tabs tablet Commonly known as:  XARELTO   traZODone 150 MG tablet Commonly known as:  DESYREL   zolpidem 5 MG tablet Commonly known as:  AMBIEN     TAKE these medications   amiodarone  100 MG tablet Commonly known as:  PACERONE Take 1 tablet (100 mg total) by mouth daily. What changed:    medication strength  how much to take   amiodarone 200 MG tablet Commonly known as:  PACERONE Take 1 tablet (200 mg total) by mouth daily. What changed:  You were already taking a medication with the same name, and this prescription was added. Make sure you understand how and when to take each.   apixaban 5 MG Tabs tablet Commonly known as:  ELIQUIS Take 1 tablet (5 mg total) by mouth 2 (two) times daily.   aspirin 81 MG EC tablet Take 1  tablet (81 mg total) by mouth daily. Start taking on:  11/16/2017   atorvastatin 20 MG tablet Commonly known as:  LIPITOR Take 1 tablet (20 mg total) by mouth daily at 6 PM.   cetirizine 10 MG tablet Commonly known as:  ZYRTEC Take 10 mg by mouth daily. Reported on 10/27/2015   D 1000 1000 units capsule Generic drug:  Cholecalciferol Take 1,000 Units by mouth daily. Reported on 11/03/2015   FLUoxetine 40 MG capsule Commonly known as:  PROZAC Take 40 mg by mouth daily.   furosemide 40 MG tablet Commonly known as:  LASIX Take 1 tablet (40 mg total) by mouth 2 (two) times daily. What changed:  when to take this   levothyroxine 150 MCG tablet Commonly known as:  SYNTHROID, LEVOTHROID Take 225 mcg by mouth See admin instructions. Taking one and one-half tablets (221mcg total) once daily What changed:  Another medication with the same name was removed. Continue taking this medication, and follow the directions you see here.   metoprolol tartrate 25 MG tablet Commonly known as:  LOPRESSOR Take 25 mg by mouth 2 (two) times daily.   OCUVITE PRESERVISION PO Take 1 tablet by mouth daily.   omeprazole 40 MG capsule Commonly known as:  PRILOSEC Take 40 mg by mouth 2 (two) times daily.   potassium chloride SA 20 MEQ tablet Commonly known as:  K-DUR,KLOR-CON Take 1 tablet (20 mEq total) by mouth 2 (two) times daily. What changed:  when to take this   SM IRON 325 (65 FE) MG tablet Generic drug:  ferrous sulfate Take 325 mg by mouth daily.      LABORATORY STUDIES CBC    Component Value Date/Time   WBC 6.9 11/15/2017 0427   RBC 3.10 (L) 11/15/2017 0427   HGB 8.9 (L) 11/15/2017 0427   HGB 14.8 12/17/2016 1118   HGB 13.4 01/12/2011 1410   HCT 28.7 (L) 11/15/2017 0427   HCT 45.1 12/17/2016 1118   HCT 40.3 01/12/2011 1410   PLT 278 11/15/2017 0427   PLT 265 12/17/2016 1118   MCV 92.6 11/15/2017 0427   MCV 85 12/17/2016 1118   MCV 84.4 01/12/2011 1410   MCH 28.7 11/15/2017  0427   MCHC 31.0 11/15/2017 0427   RDW 18.2 (H) 11/15/2017 0427   RDW 15.1 12/17/2016 1118   RDW 14.1 01/12/2011 1410   LYMPHSABS 0.8 11/14/2017 0500   LYMPHSABS 1.0 01/12/2011 1410   MONOABS 0.6 11/14/2017 0500   MONOABS 0.4 01/12/2011 1410   EOSABS 0.1 11/14/2017 0500   EOSABS 0.5 01/12/2011 1410   BASOSABS 0.0 11/14/2017 0500   BASOSABS 0.0 01/12/2011 1410   CMP    Component Value Date/Time   NA 139 11/15/2017 0427   NA 140 12/17/2016 1118   K 3.2 (L) 11/15/2017 0427   CL 105 11/15/2017 0427   CO2  21 (L) 11/15/2017 0427   GLUCOSE 83 11/15/2017 0427   BUN 9 11/15/2017 0427   BUN 26 12/17/2016 1118   CREATININE 1.50 (H) 11/15/2017 0427   CALCIUM 8.4 (L) 11/15/2017 0427   PROT 6.4 (L) 11/13/2017 2207   ALBUMIN 2.8 (L) 11/13/2017 2207   AST 39 11/13/2017 2207   ALT 29 11/13/2017 2207   ALKPHOS 73 11/13/2017 2207   BILITOT 0.8 11/13/2017 2207   GFRNONAA 32 (L) 11/15/2017 0427   GFRAA 37 (L) 11/15/2017 0427   COAGS Lab Results  Component Value Date   INR 1.04 11/13/2017   INR >10.00 (HH) 11/03/2017   INR 1.2 12/17/2016   Lipid Panel    Component Value Date/Time   CHOL 152 11/14/2017 0500   TRIG 38 11/14/2017 0500   HDL 53 11/14/2017 0500   CHOLHDL 2.9 11/14/2017 0500   VLDL 8 11/14/2017 0500   LDLCALC 91 11/14/2017 0500   HgbA1C  Lab Results  Component Value Date   HGBA1C 4.8 11/14/2017   Urinalysis No results found for: COLORURINE, APPEARANCEUR, LABSPEC, PHURINE, GLUCOSEU, HGBUR, BILIRUBINUR, KETONESUR, PROTEINUR, UROBILINOGEN, NITRITE, LEUKOCYTESUR Urine Drug Screen No results found for: LABOPIA, COCAINSCRNUR, LABBENZ, AMPHETMU, THCU, LABBARB  Alcohol Level No results found for: ETH  SIGNIFICANT DIAGNOSTIC STUDIES  IMAGING: I have personally reviewed the radiological images below and agree with the radiology interpretations. Ct Angio Head W Or Wo Contrast  Result Date: 11/13/2017 CLINICAL DATA:  77 y/o  F; left facial droop and dysphagia. EXAM: CT  ANGIOGRAPHY HEAD AND NECK CT PERFUSION BRAIN TECHNIQUE: Multidetector CT imaging of the head and neck was performed using the standard protocol during bolus administration of intravenous contrast. Multiplanar CT image reconstructions and MIPs were obtained to evaluate the vascular anatomy. Carotid stenosis measurements (when applicable) are obtained utilizing NASCET criteria, using the distal internal carotid diameter as the denominator. Multiphase CT imaging of the brain was performed following IV bolus contrast injection. Subsequent parametric perfusion maps were calculated using RAPID software. CONTRAST:  160mL ISOVUE-370 IOPAMIDOL (ISOVUE-370) INJECTION 76% COMPARISON:  None. FINDINGS: CTA NECK FINDINGS Aortic arch: Bovine variant branching. Imaged portion shows no evidence of aneurysm or dissection. No significant stenosis of the major arch vessel origins. Moderate calcific atherosclerosis. Right carotid system: Dense calcified plaque of the right carotid bifurcation with moderate 50-70% proximal ICA stenosis. Left carotid system: Dense calcified plaque of the left carotid bifurcation with mild to moderate 50% proximal ICA stenosis. Severe stenosis of the left external carotid artery origin. Vertebral arteries: Left dominant. No evidence of dissection, stenosis (50% or greater) or occlusion. Skeleton: Mild cervical spondylosis. No high-grade bony canal stenosis. Other neck: Patulous esophagus. Upper chest: Ground-glass opacities within the lung apices probably representing pulmonary edema, possibly neurogenic given stroke. Small bilateral effusions. Review of the MIP images confirms the above findings CTA HEAD FINDINGS Anterior circulation: Right proximal M1 occlusion. Poor right MCA distribution collateralization. Calcific atherosclerosis of carotid siphons with mild less than 50% bilateral paraclinoid stenosis. Patent bilateral anterior cerebral arteries and left middle cerebral artery. Posterior circulation:  Bilateral V4 short segments of dense calcified plaque with severe greater than 70% right and moderate 50-70% left stenosis. Patent basilar and posterior cerebral arteries. Left P1 2 junction mild stenosis. Venous sinuses: As permitted by contrast timing, patent. Anatomic variants: Anterior communicating artery and right posterior communicating artery identified. No left posterior communicating artery identified, likely hypoplastic or absent. Delayed phase: No abnormal intracranial enhancement. Review of the MIP images confirms the above findings CT Brain  Perfusion Findings: CBF (<30%) Volume: 63mL Perfusion (Tmax>6.0s) volume: 118mL Mismatch Volume: 168mL Infarction Location:Right MCA distribution IMPRESSION: CT brain perfusion: 1. Core infarct 28 cc, right MCA distribution. 2. Ischemic penumbra 129 cc. CTA head: 1. Right M1 proximal occlusion. Poor right MCA distribution collateralization. 2. Otherwise patent anterior and posterior circulations without aneurysm or occlusion. 3. Bilateral ICA paraclinoid and left P1/2 mild stenosis. 4. Bilateral V4 calcified plaque with severe right and moderate left stenosis. CTA neck: 1. Right proximal ICA moderate 50-70% stenosis with dense calcified plaque. 2. Left proximal ICA mild to moderate 50% stenosis with dense calcified plaque. 3. No significant stenosis of vertebral arteries. 4. No dissection or aneurysm of carotid or vertebral arteries in the neck. 5. Ground-glass opacities in lung apices, probably pulmonary edema, possibly neurogenic given stroke. Small effusions. These results were called by telephone at the time of interpretation on 11/13/2017 at 10:42 pm to Dr. Amie [place] , who verbally acknowledged these results. Electronically Signed   By: Kristine Garbe M.D.   On: 11/13/2017 22:56   Ct Head Wo Contrast  Result Date: 11/14/2017 CLINICAL DATA:  Follow-up examination status post arteriogram. EXAM: CT HEAD WITHOUT CONTRAST TECHNIQUE: Contiguous axial  images were obtained from the base of the skull through the vertex without intravenous contrast. COMPARISON:  Prior studies from 11/13/2017. FINDINGS: Brain: Atrophy with chronic microvascular ischemic disease. No acute intracranial hemorrhage. Subtle asymmetric hypodensity within the anterior right temporal lobe suspicious for evolving ischemia (series 3, image 12). No significant mass effect. No other acute or evolving large vessel territory infarct. No mass lesion or midline shift. No hydrocephalus. No extra-axial fluid collection. Vascular: Diffuse hyperdensity throughout the intracranial circulation, likely reflecting contrast material. Intracranial atherosclerosis again noted. Skull: And calvarium within normal limits.  Scalp soft tissues Sinuses/Orbits: Globes and orbital soft tissues within normal limits. Paranasal sinuses and mastoid air cells are clear. Other: None. IMPRESSION: 1. No acute intracranial hemorrhage or other complication identified status post catheter directed revascularization. 2. Hypodensity within the anterior right temporal lobe, consistent with evolving right MCA territory infarct. No associated mass effect. 3. Otherwise stable appearance of the brain. No other acute intracranial abnormality. Electronically Signed   By: Jeannine Boga M.D.   On: 11/14/2017 05:07   Ct Angio Neck W Or Wo Contrast  Result Date: 11/13/2017 CLINICAL DATA:  77 y/o  F; left facial droop and dysphagia. EXAM: CT ANGIOGRAPHY HEAD AND NECK CT PERFUSION BRAIN TECHNIQUE: Multidetector CT imaging of the head and neck was performed using the standard protocol during bolus administration of intravenous contrast. Multiplanar CT image reconstructions and MIPs were obtained to evaluate the vascular anatomy. Carotid stenosis measurements (when applicable) are obtained utilizing NASCET criteria, using the distal internal carotid diameter as the denominator. Multiphase CT imaging of the brain was performed  following IV bolus contrast injection. Subsequent parametric perfusion maps were calculated using RAPID software. CONTRAST:  169mL ISOVUE-370 IOPAMIDOL (ISOVUE-370) INJECTION 76% COMPARISON:  None. FINDINGS: CTA NECK FINDINGS Aortic arch: Bovine variant branching. Imaged portion shows no evidence of aneurysm or dissection. No significant stenosis of the major arch vessel origins. Moderate calcific atherosclerosis. Right carotid system: Dense calcified plaque of the right carotid bifurcation with moderate 50-70% proximal ICA stenosis. Left carotid system: Dense calcified plaque of the left carotid bifurcation with mild to moderate 50% proximal ICA stenosis. Severe stenosis of the left external carotid artery origin. Vertebral arteries: Left dominant. No evidence of dissection, stenosis (50% or greater) or occlusion. Skeleton: Mild cervical spondylosis. No  high-grade bony canal stenosis. Other neck: Patulous esophagus. Upper chest: Ground-glass opacities within the lung apices probably representing pulmonary edema, possibly neurogenic given stroke. Small bilateral effusions. Review of the MIP images confirms the above findings CTA HEAD FINDINGS Anterior circulation: Right proximal M1 occlusion. Poor right MCA distribution collateralization. Calcific atherosclerosis of carotid siphons with mild less than 50% bilateral paraclinoid stenosis. Patent bilateral anterior cerebral arteries and left middle cerebral artery. Posterior circulation: Bilateral V4 short segments of dense calcified plaque with severe greater than 70% right and moderate 50-70% left stenosis. Patent basilar and posterior cerebral arteries. Left P1 2 junction mild stenosis. Venous sinuses: As permitted by contrast timing, patent. Anatomic variants: Anterior communicating artery and right posterior communicating artery identified. No left posterior communicating artery identified, likely hypoplastic or absent. Delayed phase: No abnormal intracranial  enhancement. Review of the MIP images confirms the above findings CT Brain Perfusion Findings: CBF (<30%) Volume: 22mL Perfusion (Tmax>6.0s) volume: 149mL Mismatch Volume: 173mL Infarction Location:Right MCA distribution IMPRESSION: CT brain perfusion: 1. Core infarct 28 cc, right MCA distribution. 2. Ischemic penumbra 129 cc. CTA head: 1. Right M1 proximal occlusion. Poor right MCA distribution collateralization. 2. Otherwise patent anterior and posterior circulations without aneurysm or occlusion. 3. Bilateral ICA paraclinoid and left P1/2 mild stenosis. 4. Bilateral V4 calcified plaque with severe right and moderate left stenosis. CTA neck: 1. Right proximal ICA moderate 50-70% stenosis with dense calcified plaque. 2. Left proximal ICA mild to moderate 50% stenosis with dense calcified plaque. 3. No significant stenosis of vertebral arteries. 4. No dissection or aneurysm of carotid or vertebral arteries in the neck. 5. Ground-glass opacities in lung apices, probably pulmonary edema, possibly neurogenic given stroke. Small effusions. These results were called by telephone at the time of interpretation on 11/13/2017 at 10:42 pm to Dr. Amie [place] , who verbally acknowledged these results. Electronically Signed   By: Kristine Garbe M.D.   On: 11/13/2017 22:56   Ct Cerebral Perfusion W Contrast  Result Date: 11/13/2017 CLINICAL DATA:  77 y/o  F; left facial droop and dysphagia. EXAM: CT ANGIOGRAPHY HEAD AND NECK CT PERFUSION BRAIN TECHNIQUE: Multidetector CT imaging of the head and neck was performed using the standard protocol during bolus administration of intravenous contrast. Multiplanar CT image reconstructions and MIPs were obtained to evaluate the vascular anatomy. Carotid stenosis measurements (when applicable) are obtained utilizing NASCET criteria, using the distal internal carotid diameter as the denominator. Multiphase CT imaging of the brain was performed following IV bolus contrast  injection. Subsequent parametric perfusion maps were calculated using RAPID software. CONTRAST:  12mL ISOVUE-370 IOPAMIDOL (ISOVUE-370) INJECTION 76% COMPARISON:  None. FINDINGS: CTA NECK FINDINGS Aortic arch: Bovine variant branching. Imaged portion shows no evidence of aneurysm or dissection. No significant stenosis of the major arch vessel origins. Moderate calcific atherosclerosis. Right carotid system: Dense calcified plaque of the right carotid bifurcation with moderate 50-70% proximal ICA stenosis. Left carotid system: Dense calcified plaque of the left carotid bifurcation with mild to moderate 50% proximal ICA stenosis. Severe stenosis of the left external carotid artery origin. Vertebral arteries: Left dominant. No evidence of dissection, stenosis (50% or greater) or occlusion. Skeleton: Mild cervical spondylosis. No high-grade bony canal stenosis. Other neck: Patulous esophagus. Upper chest: Ground-glass opacities within the lung apices probably representing pulmonary edema, possibly neurogenic given stroke. Small bilateral effusions. Review of the MIP images confirms the above findings CTA HEAD FINDINGS Anterior circulation: Right proximal M1 occlusion. Poor right MCA distribution collateralization. Calcific atherosclerosis of carotid siphons  with mild less than 50% bilateral paraclinoid stenosis. Patent bilateral anterior cerebral arteries and left middle cerebral artery. Posterior circulation: Bilateral V4 short segments of dense calcified plaque with severe greater than 70% right and moderate 50-70% left stenosis. Patent basilar and posterior cerebral arteries. Left P1 2 junction mild stenosis. Venous sinuses: As permitted by contrast timing, patent. Anatomic variants: Anterior communicating artery and right posterior communicating artery identified. No left posterior communicating artery identified, likely hypoplastic or absent. Delayed phase: No abnormal intracranial enhancement. Review of the MIP  images confirms the above findings CT Brain Perfusion Findings: CBF (<30%) Volume: 30mL Perfusion (Tmax>6.0s) volume: 144mL Mismatch Volume: 167mL Infarction Location:Right MCA distribution IMPRESSION: CT brain perfusion: 1. Core infarct 28 cc, right MCA distribution. 2. Ischemic penumbra 129 cc. CTA head: 1. Right M1 proximal occlusion. Poor right MCA distribution collateralization. 2. Otherwise patent anterior and posterior circulations without aneurysm or occlusion. 3. Bilateral ICA paraclinoid and left P1/2 mild stenosis. 4. Bilateral V4 calcified plaque with severe right and moderate left stenosis. CTA neck: 1. Right proximal ICA moderate 50-70% stenosis with dense calcified plaque. 2. Left proximal ICA mild to moderate 50% stenosis with dense calcified plaque. 3. No significant stenosis of vertebral arteries. 4. No dissection or aneurysm of carotid or vertebral arteries in the neck. 5. Ground-glass opacities in lung apices, probably pulmonary edema, possibly neurogenic given stroke. Small effusions. These results were called by telephone at the time of interpretation on 11/13/2017 at 10:42 pm to Dr. Amie [place] , who verbally acknowledged these results. Electronically Signed   By: Kristine Garbe M.D.   On: 11/13/2017 22:56   Dg Chest Port 1 View  Result Date: 11/14/2017 CLINICAL DATA:  Pneumonia.  Hypertension and atrial fibrillation. EXAM: PORTABLE CHEST 1 VIEW COMPARISON:  11/03/2017 FINDINGS: The heart is enlarged but appears stable. Stable tortuosity and calcification of the thoracic aorta. Vascular congestion and diffuse interstitial edema and probable small right pleural effusion. Findings suggest CHF. IMPRESSION: CHF. Electronically Signed   By: Marijo Sanes M.D.   On: 11/14/2017 08:43   Ct Head Code Stroke Wo Contrast  Result Date: 11/13/2017 CLINICAL DATA:  Code stroke.  77 y/o  F; left-sided facial droop. EXAM: CT HEAD WITHOUT CONTRAST TECHNIQUE: Contiguous axial images were  obtained from the base of the skull through the vertex without intravenous contrast. COMPARISON:  None. FINDINGS: Brain: Hypoattenuation is present within the right temporal lobe, insula, putamen, and caudate body with loss of gray-white differentiation likely representing an acute infarction. Aspects is 6. No acute hemorrhage. No significant mass effect. No other stroke, focal mass effect, or brain parenchymal lesion identified. Vascular: Right dense M1 likely thrombus. Skull: Normal. Negative for fracture or focal lesion. Sinuses/Orbits: No acute finding. Other: None. ASPECTS [facility] Stroke Program Early CT Score) - Ganglionic level infarction (caudate, lentiform nuclei, internal capsule, insula, M1-M3 cortex): 3 - Supraganglionic infarction (M4-M6 cortex): 3 Total score (0-10 with 10 being normal): 6 IMPRESSION: 1. Right MCA distribution hypoattenuation with loss of gray-white differentiation compatible with acute infarct. No hemorrhage or mass effect. 2. ASPECTS is 6 3. Right M1 dense vessel, likely thrombus. These results were called by telephone at the time of interpretation on 11/13/2017 at 10:22 pm to Dr. Amie [place] , who verbally acknowledged these results. Electronically Signed   By: Kristine Garbe M.D.   On: 11/13/2017 22:27   Echocardiogram:  Study Conclusions - Left ventricle: The cavity size was normal. Wall thickness was   increased in a pattern of mild LVH. Systolic function was normal.   The estimated ejection fraction was in the range of 60% to 65%.   Wall motion was normal; there were no regional wall motion   abnormalities. Doppler parameters are consistent with high   ventricular filling pressure. - Aortic valve: Valve mobility was restricted. There was moderate   regurgitation. - Mitral valve: Calcified annulus. There was moderate   regurgitation. - Left atrium: The atrium was moderately dilated. Volume/bsa, ES   (1-plane  Simpson&'s, A4C): 47.4 ml/m^2. - Right atrium: The atrium was mildly dilated. - Tricuspid valve: There was mild regurgitation. - Pulmonary arteries: Systolic pressure was moderately increased. Impressions:- No cardiac source of emboli was indentified.  MRI Head                                                           IMPRESSION: Nonhemorrhagic acute infarct within the right MCA territory, involving the right caudate nucleus, right lentiform nucleus and the anterior right temporal lobe cortex. No associated mass effect.   HISTORY OF PRESENT ILLNESS    &    HOSPITAL COURSE IMPRESSION: Ms. Eliska Hamil is a 77 y.o. female with PMH of atrial fibrillation - Xarelto discontinued due to recent GI bleed, chronic low back pain, congestive heart failure, hypertension, hypothyroidism with acute onset of left-sided weakness and right gaze preference along with dysarthria and left-sided facial droop.  NIH stroke scale 20 on arrival. She was a candidate for endovascular thrombectomy.    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 , Likely undx OSA, CHF  PROCEDURES: By Dr Estanislado Pandy on 11/14/2017 S/P RT common carotid arteriogram. RT CFA approacg. Findings. 1.S/P complete revascularization of RT MCA M1 occlusion with x 2 passes with 61mm x 40 mm retriever device using the solumbra technique with TICI 3 reperfusion  Outstanding Stroke Work-up Studies:    work up is completed at this time  11/14/2017 ASSESSMENT:   Neuro exam continues to improve. Patient found sitting up in bed feeding self breakfast. Ambulated with PT yesterday and plans to ambulate again prior to discharge. Discussed with patient and husband at bedside imaging and plan to  restart [facility] therapy with Eliquis today. Verbalize good understanding of POC. Some Bradycardia overnight in upper 50's. K+ replacement in progress both PO and IV.  PLAN  11/15/2017: Continue Aspirin 81 mg/ Statin/ Start Ellquis today per pharmacy.patient had prior history of hematemesis on Xarelto from Barrett's esophagus but risk benefit of anticoagulation is in favor given recent embolic stroke. Discussed with patient, husband and cardiology who are all in agreement  Discharge home today after PT/OT therapy Ongoing aggressive stroke risk factor management Patient counseled to be compliant withherantithrombotic medications Patient counseled on Lifestyle modifications including, Diet, Exercise, and Stress Follow up with [facility] in 6 weeks Follow up with Opthalmology - No driving until cleared at follow up appointment Follow up with PCP and Cardiology Follow up with PCP for labs next week to monitor electrolytes and Anemia  MEDICAL ISSUES:  Acute Respiratory Failure -intubated for endovascular thrombectomy. -vent management per ICU Extubated this AM CXR stable with acute findings  Heart failure, Acute on chronic systolic (congestive) heart failure  -TTE -Appreciate Cardiology assistance in medical management. CXR stable with acute findings Lasix in progress Follow up with Cardiology  Chronic atrial fibrillation -Rate control, some bradycardia overnight. 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 Home B/P medications restarted Home Meds: Toprol-XL, Pacerone  HYPERLIPIDEMIA: Labs(Brief)          Component Value Date/Time   CHOL 152 11/14/2017 0500   TRIG 38 11/14/2017 0500   HDL 53 11/14/2017 0500   CHOLHDL 2.9 11/14/2017 0500   VLDL 8 11/14/2017 0500   LDLCALC 91 11/14/2017 0500    Home Meds:  NONE LDL  goal < 70 Started on  Lipitor to 20 mg daily Continue statin at discharge  R/O DIABETES: RecentLabs       Lab Results  Component Value Date   HGBA1C 4.8 11/14/2017    HgbA1c goal < 7.0  OBESITY Obesity, Body mass index is 38.15 kg/m. Greater than/equal to 30  Other Active Problems: Active Problems:   Stroke (cerebrum) (HCC)   Middle cerebral artery embolism, right  DISCHARGE EXAM Blood pressure (!) 166/98, pulse (!) 59, temperature (!) 97.5 F (36.4 C), temperature source Oral, resp. rate 14, height 5\' 2"  (1.575 m), weight 94.6 kg (208 lb 8.9 oz), SpO2 94 %. General - Well nourished, well developed elderly Caucasian lady, in no apparent distress HEENT-  Normocephalic,  Cardiovascular - Regular rate and rhythm  Respiratory - Lungs clear bilaterally. No wheezing. Abdomen - soft and non-tender, BS normal Extremities- no edema or cyanosis Neurological Exam: Mental status: Patient is awake, alert, in no acute distress Language: Her speech is mildly dysarthric. follow commands appropriately. Cranial nerves: Pupils equal round reactive to light, No  gaze deviation to the right but can look to the left past midline., No blink to threat on the left, +Left partial field cut. facial droop on the left lower face, intact hearing. Motor exam: 4/5 left upper and lower extremities.  Normal 5/5 right upper and lower extremities. Sensory: Dense sensory loss on the left.   Coordination: Intact coordination on the right.  Unable to perform on the left. Gait examination-  not tested NIHSS 2  Discharge Diet   Fall precautions DIET SOFT Room service appropriate? Yes; Fluid consistency: Thin liquids  DISCHARGE PLAN  Disposition:  HOME with [facility] PT/OT  aspirin 81 mg daily and Eliquis (apixaban) daily for secondary stroke prevention.  Ongoing risk factor control by Primary Care Physician at time of discharge  Follow-up Maylon Peppers, MD in 2 weeks.  Follow-up with Dr. Antony Contras, Stroke Clinic in 6 weeks, office to schedule an appointment.  Follow up with PCP and Cardiology in the next 1-2 weeks  Follow up labs to monitor electrolytes with PCP next week  Greater than 35 minutes were spent preparing discharge.  Mary Sella, ANP-C Stroke Neurology Team 11/15/2017 11:25 AM I have personally examined this patient, reviewed notes, independently viewed imaging studies, participated in medical decision making and plan of care.ROS completed by me personally and pertinent positives fully documented  I have made any additions or clarifications directly to the above note. Agree with note above.    Antony Contras, MD Medical Director [facility] Pager: [phone] 11/15/2017 11:55 AM

## 2017-11-15 NOTE — Progress Notes (Signed)
Occupational Therapy Treatment Patient Details Name: Teresa York MRN: [id number] DOB: 07-20-1941 Today's Date: 11/15/2017    History of present illness 77 yo admitted with left weakness with R MCA CVA s/p revascularization. PMHx: Afib, HTN, CHF, EF 30-50%, chronic LBP   OT comments  This 77 yo female admitted with above presents to acute OT today making progress with basic ADLs. She is to D/C today and will continue to benefit from follow up [place]. Acute OT will D/C pt.  Follow Up Recommendations  Home health OT;Supervision/Assistance - 24 hour    Equipment Recommendations  None recommended by OT       Precautions / Restrictions Precautions Precautions: Fall Restrictions Weight Bearing Restrictions: No       Mobility Bed Mobility Overal bed mobility: Modified Independent             General bed mobility comments: Pt up in recliner upon arrival   Transfers Overall transfer level: Needs assistance   Transfers: Sit to/from Stand Sit to Stand: Min guard         General transfer comment: cues for hand placement and safety rising from bed and toilet with rail     Balance Overall balance assessment: Needs assistance Sitting-balance support: No upper extremity supported Sitting balance-Leahy Scale: Fair       Standing balance-Leahy Scale: Poor Standing balance comment: reliant on RW                           ADL either performed or assessed with clinical judgement   ADL Overall ADL's : Needs assistance/impaired     Grooming: Wash/dry hands;Min guard;Standing             Upper Body Dressing Details (indicate cue type and reason): Educated on putting LUE in shirt sleeve first Lower Body Dressing: Minimal assistance Lower Body Dressing Details (indicate cue type and reason): Min guard A sit<>stand; educated on putting LLE on pants and underwear first and to put these on before socks Toilet Transfer: Min guard;Ambulation;Comfort height toilet;Grab  bars   Toileting- Clothing Manipulation and Hygiene: Min guard;Sit to/from stand               Vision Baseline Vision/History: Wears glasses Wears Glasses: Reading only Patient Visual Report: No change from baseline Vision Assessment?: Yes Eye Alignment: Within Functional Limits Ocular Range of Motion: Within Functional Limits Alignment/Gaze Preference: Within Defined Limits Tracking/Visual Pursuits: Able to track stimulus in all quads without difficulty Convergence: Within functional limits Visual Fields: No apparent deficits          Cognition Arousal/Alertness: Awake/alert Behavior During Therapy: Flat affect Overall Cognitive Status: Impaired/Different from baseline Area of Impairment: Following commands;Problem solving                       Following Commands: Follows multi-step commands with increased time     Problem Solving: Slow processing                     Pertinent Vitals/ Pain       Pain Assessment: No/denies pain         Frequency  Min 3X/week        Progress Toward Goals  OT Goals(current goals can now be found in the care plan section)  Progress towards OT goals: Progressing toward goals     Plan Discharge plan remains appropriate       AM-PAC PT "6 Clicks"  Daily Activity     Outcome Measure   Help from another person eating meals?: None Help from another person taking care of personal grooming?: A Little Help from another person toileting, which includes using toliet, bedpan, or urinal?: A Little Help from another person bathing (including washing, rinsing, drying)?: A Little Help from another person to put on and taking off regular upper body clothing?: A Little Help from another person to put on and taking off regular lower body clothing?: A Little 6 Click Score: 19    End of Session    OT Visit Diagnosis: Unsteadiness on feet (R26.81);Muscle weakness (generalized) (M62.81);Cognitive communication deficit  (R41.841) Symptoms and signs involving cognitive functions: Cerebral infarction   Activity Tolerance Patient tolerated treatment well   Patient Left in chair;with call bell/phone within reach;with chair alarm set   Nurse Communication          Time: 2409-7353 OT Time Calculation (min): 37 min  Charges: OT General Charges $OT Visit: 1 Visit OT Treatments $Self Care/Home Management : 23-37 mins  [address], OTR/L [phone] 11/15/2017

## 2017-11-15 NOTE — Progress Notes (Signed)
Referring Physician(s): Dr Royal Hawthorn  Supervising Physician: Luanne Bras  Patient Status:  [facility] - [facility]  Chief Complaint:  CVA  2/7: S/P complete revascularization of RT MCA M1 occlusion with x 2 passes with 71mm x 40 mm retriever device using the solumbra technique with TICI 3 reperfusion.  Subjective:  Doing great Up in bed eating well Denies N/V Denies pain Denies headache Denies vision or speech changes Denies numbness or tingling Moving all 4s  Allergies: Benazepril and Diltiazem hcl  Medications: Prior to Admission medications   Medication Sig Start Date End Date Taking? Authorizing Provider  amiodarone (PACERONE) 400 MG tablet Take 0.5 tablets (200 mg total) by mouth daily. Patient taking differently: Take 400 mg by mouth daily.  01/30/17  Yes Allred, Jeneen Rinks, MD  cetirizine (ZYRTEC) 10 MG tablet Take 10 mg by mouth daily. Reported on 10/27/2015   Yes [provider]  Cholecalciferol (D 1000) 1000 units capsule Take 1,000 Units by mouth daily. Reported on 11/03/2015 11/30/14  Yes [provider]  ferrous sulfate (SM IRON) 325 (65 FE) MG tablet Take 325 mg by mouth daily.  11/30/14  Yes [provider]  FLUoxetine (PROZAC) 40 MG capsule Take 40 mg by mouth daily.  08/24/11  Yes [provider]  furosemide (LASIX) 40 MG tablet Take 1 tablet (40 mg total) by mouth 2 (two) times daily. Patient taking differently: Take 40 mg by mouth daily.  05/13/17  Yes Burnell Blanks, MD  levothyroxine (SYNTHROID, LEVOTHROID) 150 MCG tablet Take 225 mcg by mouth See admin instructions. Taking one and one-half tablets (229mcg total) once daily   Yes [provider]  metoprolol tartrate (LOPRESSOR) 25 MG tablet Take 25 mg by mouth 2 (two) times daily. 11/07/17 12/07/17 Yes [provider]  Multiple Vitamins-Minerals (OCUVITE PRESERVISION PO) Take 1 tablet by mouth daily.   Yes [provider]  omeprazole (PRILOSEC) 40 MG  capsule Take 40 mg by mouth 2 (two) times daily.   Yes [provider]  potassium chloride SA (K-DUR,KLOR-CON) 20 MEQ tablet Take 1 tablet (20 mEq total) by mouth 2 (two) times daily. Patient taking differently: Take 20 mEq by mouth daily.  11/20/16  Yes Carlota Raspberry, Tiffany, PA-C  traZODone (DESYREL) 150 MG tablet Take 75 mg by mouth at bedtime as needed for sleep.  11/06/13  Yes [provider]  zolpidem (AMBIEN) 5 MG tablet Take 5 mg by mouth at bedtime as needed for sleep.    Yes [provider]  levothyroxine (SYNTHROID, LEVOTHROID) 175 MCG tablet Take 1 tablet (175 mcg total) by mouth daily before breakfast. Patient not taking: Reported on 11/14/2017 11/20/16   Delos Haring, PA-C  Rivaroxaban (XARELTO) 20 MG TABS tablet Take 1 tablet (20 mg total) by mouth daily with supper. TAKE ONE TABLET BY MOUTH ONCE DAILY WITH SUPPER Patient not taking: Reported on 11/14/2017 01/30/17   Allred, Jeneen Rinks, MD     Vital Signs: BP (!) 131/53   Pulse (!) 52   Temp 98.6 F (37 C) (Oral)   Resp (!) 21   Ht 5\' 2"  (1.575 m)   Wt 208 lb 8.9 oz (94.6 kg)   LMP  (LMP Unknown)   SpO2 92%   BMI 38.15 kg/m   Physical Exam  Constitutional: She is oriented to person, place, and time.  Face symmetrical; tongue midline  Musculoskeletal: Normal range of motion.  Neurological: She is alert and oriented to person, place, and time.  Skin: Skin is warm  and dry.  Right groin NT no bleeding  no hematoma Rt foot 2+ pulses Moves all 4s= Good strength B   Psychiatric: She has a normal mood and affect. Her behavior is normal.  Nursing note and vitals reviewed.   Imaging: Ct Angio Head W Or Wo Contrast  Result Date: 11/13/2017 CLINICAL DATA:  77 y/o  F; left facial droop and dysphagia. EXAM: CT ANGIOGRAPHY HEAD AND NECK CT PERFUSION BRAIN TECHNIQUE: Multidetector CT imaging of the head and neck was performed using the standard protocol during bolus administration of intravenous contrast.  Multiplanar CT image reconstructions and MIPs were obtained to evaluate the vascular anatomy. Carotid stenosis measurements (when applicable) are obtained utilizing NASCET criteria, using the distal internal carotid diameter as the denominator. Multiphase CT imaging of the brain was performed following IV bolus contrast injection. Subsequent parametric perfusion maps were calculated using RAPID software. CONTRAST:  116mL ISOVUE-370 IOPAMIDOL (ISOVUE-370) INJECTION 76% COMPARISON:  None. FINDINGS: CTA NECK FINDINGS Aortic arch: Bovine variant branching. Imaged portion shows no evidence of aneurysm or dissection. No significant stenosis of the major arch vessel origins. Moderate calcific atherosclerosis. Right carotid system: Dense calcified plaque of the right carotid bifurcation with moderate 50-70% proximal ICA stenosis. Left carotid system: Dense calcified plaque of the left carotid bifurcation with mild to moderate 50% proximal ICA stenosis. Severe stenosis of the left external carotid artery origin. Vertebral arteries: Left dominant. No evidence of dissection, stenosis (50% or greater) or occlusion. Skeleton: Mild cervical spondylosis. No high-grade bony canal stenosis. Other neck: Patulous esophagus. Upper chest: Ground-glass opacities within the lung apices probably representing pulmonary edema, possibly neurogenic given stroke. Small bilateral effusions. Review of the MIP images confirms the above findings CTA HEAD FINDINGS Anterior circulation: Right proximal M1 occlusion. Poor right MCA distribution collateralization. Calcific atherosclerosis of carotid siphons with mild less than 50% bilateral paraclinoid stenosis. Patent bilateral anterior cerebral arteries and left middle cerebral artery. Posterior circulation: Bilateral V4 short segments of dense calcified plaque with severe greater than 70% right and moderate 50-70% left stenosis. Patent basilar and posterior cerebral arteries. Left P1 2 junction mild  stenosis. Venous sinuses: As permitted by contrast timing, patent. Anatomic variants: Anterior communicating artery and right posterior communicating artery identified. No left posterior communicating artery identified, likely hypoplastic or absent. Delayed phase: No abnormal intracranial enhancement. Review of the MIP images confirms the above findings CT Brain Perfusion Findings: CBF (<30%) Volume: 17mL Perfusion (Tmax>6.0s) volume: 150mL Mismatch Volume: 19mL Infarction Location:Right MCA distribution IMPRESSION: CT brain perfusion: 1. Core infarct 28 cc, right MCA distribution. 2. Ischemic penumbra 129 cc. CTA head: 1. Right M1 proximal occlusion. Poor right MCA distribution collateralization. 2. Otherwise patent anterior and posterior circulations without aneurysm or occlusion. 3. Bilateral ICA paraclinoid and left P1/2 mild stenosis. 4. Bilateral V4 calcified plaque with severe right and moderate left stenosis. CTA neck: 1. Right proximal ICA moderate 50-70% stenosis with dense calcified plaque. 2. Left proximal ICA mild to moderate 50% stenosis with dense calcified plaque. 3. No significant stenosis of vertebral arteries. 4. No dissection or aneurysm of carotid or vertebral arteries in the neck. 5. Ground-glass opacities in lung apices, probably pulmonary edema, possibly neurogenic given stroke. Small effusions. These results were called by telephone at the time of interpretation on 11/13/2017 at 10:42 pm to Dr. Amie Portland , who verbally acknowledged these results. Electronically Signed   By: Kristine Garbe M.D.   On: 11/13/2017 22:56   Ct Head Wo Contrast  Result Date: 11/14/2017 CLINICAL  DATA:  Follow-up examination status post arteriogram. EXAM: CT HEAD WITHOUT CONTRAST TECHNIQUE: Contiguous axial images were obtained from the base of the skull through the vertex without intravenous contrast. COMPARISON:  Prior studies from 11/13/2017. FINDINGS: Brain: Atrophy with chronic microvascular  ischemic disease. No acute intracranial hemorrhage. Subtle asymmetric hypodensity within the anterior right temporal lobe suspicious for evolving ischemia (series 3, image 12). No significant mass effect. No other acute or evolving large vessel territory infarct. No mass lesion or midline shift. No hydrocephalus. No extra-axial fluid collection. Vascular: Diffuse hyperdensity throughout the intracranial circulation, likely reflecting contrast material. Intracranial atherosclerosis again noted. Skull: And calvarium within normal limits.  Scalp soft tissues Sinuses/Orbits: Globes and orbital soft tissues within normal limits. Paranasal sinuses and mastoid air cells are clear. Other: None. IMPRESSION: 1. No acute intracranial hemorrhage or other complication identified status post catheter directed revascularization. 2. Hypodensity within the anterior right temporal lobe, consistent with evolving right MCA territory infarct. No associated mass effect. 3. Otherwise stable appearance of the brain. No other acute intracranial abnormality. Electronically Signed   By: Jeannine Boga M.D.   On: 11/14/2017 05:07   Ct Angio Neck W Or Wo Contrast  Result Date: 11/13/2017 CLINICAL DATA:  77 y/o  F; left facial droop and dysphagia. EXAM: CT ANGIOGRAPHY HEAD AND NECK CT PERFUSION BRAIN TECHNIQUE: Multidetector CT imaging of the head and neck was performed using the standard protocol during bolus administration of intravenous contrast. Multiplanar CT image reconstructions and MIPs were obtained to evaluate the vascular anatomy. Carotid stenosis measurements (when applicable) are obtained utilizing NASCET criteria, using the distal internal carotid diameter as the denominator. Multiphase CT imaging of the brain was performed following IV bolus contrast injection. Subsequent parametric perfusion maps were calculated using RAPID software. CONTRAST:  128mL ISOVUE-370 IOPAMIDOL (ISOVUE-370) INJECTION 76% COMPARISON:  None.  FINDINGS: CTA NECK FINDINGS Aortic arch: Bovine variant branching. Imaged portion shows no evidence of aneurysm or dissection. No significant stenosis of the major arch vessel origins. Moderate calcific atherosclerosis. Right carotid system: Dense calcified plaque of the right carotid bifurcation with moderate 50-70% proximal ICA stenosis. Left carotid system: Dense calcified plaque of the left carotid bifurcation with mild to moderate 50% proximal ICA stenosis. Severe stenosis of the left external carotid artery origin. Vertebral arteries: Left dominant. No evidence of dissection, stenosis (50% or greater) or occlusion. Skeleton: Mild cervical spondylosis. No high-grade bony canal stenosis. Other neck: Patulous esophagus. Upper chest: Ground-glass opacities within the lung apices probably representing pulmonary edema, possibly neurogenic given stroke. Small bilateral effusions. Review of the MIP images confirms the above findings CTA HEAD FINDINGS Anterior circulation: Right proximal M1 occlusion. Poor right MCA distribution collateralization. Calcific atherosclerosis of carotid siphons with mild less than 50% bilateral paraclinoid stenosis. Patent bilateral anterior cerebral arteries and left middle cerebral artery. Posterior circulation: Bilateral V4 short segments of dense calcified plaque with severe greater than 70% right and moderate 50-70% left stenosis. Patent basilar and posterior cerebral arteries. Left P1 2 junction mild stenosis. Venous sinuses: As permitted by contrast timing, patent. Anatomic variants: Anterior communicating artery and right posterior communicating artery identified. No left posterior communicating artery identified, likely hypoplastic or absent. Delayed phase: No abnormal intracranial enhancement. Review of the MIP images confirms the above findings CT Brain Perfusion Findings: CBF (<30%) Volume: 35mL Perfusion (Tmax>6.0s) volume: 129mL Mismatch Volume: 163mL Infarction  Location:Right MCA distribution IMPRESSION: CT brain perfusion: 1. Core infarct 28 cc, right MCA distribution. 2. Ischemic penumbra 129 cc. CTA head: 1.  Right M1 proximal occlusion. Poor right MCA distribution collateralization. 2. Otherwise patent anterior and posterior circulations without aneurysm or occlusion. 3. Bilateral ICA paraclinoid and left P1/2 mild stenosis. 4. Bilateral V4 calcified plaque with severe right and moderate left stenosis. CTA neck: 1. Right proximal ICA moderate 50-70% stenosis with dense calcified plaque. 2. Left proximal ICA mild to moderate 50% stenosis with dense calcified plaque. 3. No significant stenosis of vertebral arteries. 4. No dissection or aneurysm of carotid or vertebral arteries in the neck. 5. Ground-glass opacities in lung apices, probably pulmonary edema, possibly neurogenic given stroke. Small effusions. These results were called by telephone at the time of interpretation on 11/13/2017 at 10:42 pm to Dr. Amie Portland , who verbally acknowledged these results. Electronically Signed   By: Kristine Garbe M.D.   On: 11/13/2017 22:56   Mr Brain Wo Contrast  Result Date: 11/14/2017 CLINICAL DATA:  History of atrial fibrillation. Acute onset speech difficulty. EXAM: MRI HEAD WITHOUT CONTRAST TECHNIQUE: Multiplanar, multiecho pulse sequences of the brain and surrounding structures were obtained without intravenous contrast. COMPARISON:  Head CT 11/14/2017 FINDINGS: Brain: The midline structures are normal. There is multifocal abnormal diffusion restriction within the right MCA territory including the right caudate and lentiform nuclei and the cortex of the right temporal lobe. No midline shift or mass effect. No other site of abnormal diffusion restriction. No mass lesion, hydrocephalus, dural abnormality or extra-axial collection. Minimal periventricular white matter hyperintensity. No age-advanced or lobar predominant atrophy. No chronic microhemorrhage or  superficial siderosis. Vascular: Major intracranial arterial and venous sinus flow voids are preserved. Skull and upper cervical spine: The visualized skull base, calvarium, upper cervical spine and extracranial soft tissues are normal. Sinuses/Orbits: No fluid levels or advanced mucosal thickening. No mastoid or middle ear effusion. Normal orbits. IMPRESSION: Nonhemorrhagic acute infarct within the right MCA territory, involving the right caudate nucleus, right lentiform nucleus and the anterior right temporal lobe cortex. No associated mass effect. Electronically Signed   By: Ulyses Jarred M.D.   On: 11/14/2017 16:20   Ct Cerebral Perfusion W Contrast  Result Date: 11/13/2017 CLINICAL DATA:  77 y/o  F; left facial droop and dysphagia. EXAM: CT ANGIOGRAPHY HEAD AND NECK CT PERFUSION BRAIN TECHNIQUE: Multidetector CT imaging of the head and neck was performed using the standard protocol during bolus administration of intravenous contrast. Multiplanar CT image reconstructions and MIPs were obtained to evaluate the vascular anatomy. Carotid stenosis measurements (when applicable) are obtained utilizing NASCET criteria, using the distal internal carotid diameter as the denominator. Multiphase CT imaging of the brain was performed following IV bolus contrast injection. Subsequent parametric perfusion maps were calculated using RAPID software. CONTRAST:  178mL ISOVUE-370 IOPAMIDOL (ISOVUE-370) INJECTION 76% COMPARISON:  None. FINDINGS: CTA NECK FINDINGS Aortic arch: Bovine variant branching. Imaged portion shows no evidence of aneurysm or dissection. No significant stenosis of the major arch vessel origins. Moderate calcific atherosclerosis. Right carotid system: Dense calcified plaque of the right carotid bifurcation with moderate 50-70% proximal ICA stenosis. Left carotid system: Dense calcified plaque of the left carotid bifurcation with mild to moderate 50% proximal ICA stenosis. Severe stenosis of the left  external carotid artery origin. Vertebral arteries: Left dominant. No evidence of dissection, stenosis (50% or greater) or occlusion. Skeleton: Mild cervical spondylosis. No high-grade bony canal stenosis. Other neck: Patulous esophagus. Upper chest: Ground-glass opacities within the lung apices probably representing pulmonary edema, possibly neurogenic given stroke. Small bilateral effusions. Review of the MIP images confirms the above findings CTA  HEAD FINDINGS Anterior circulation: Right proximal M1 occlusion. Poor right MCA distribution collateralization. Calcific atherosclerosis of carotid siphons with mild less than 50% bilateral paraclinoid stenosis. Patent bilateral anterior cerebral arteries and left middle cerebral artery. Posterior circulation: Bilateral V4 short segments of dense calcified plaque with severe greater than 70% right and moderate 50-70% left stenosis. Patent basilar and posterior cerebral arteries. Left P1 2 junction mild stenosis. Venous sinuses: As permitted by contrast timing, patent. Anatomic variants: Anterior communicating artery and right posterior communicating artery identified. No left posterior communicating artery identified, likely hypoplastic or absent. Delayed phase: No abnormal intracranial enhancement. Review of the MIP images confirms the above findings CT Brain Perfusion Findings: CBF (<30%) Volume: 12mL Perfusion (Tmax>6.0s) volume: 181mL Mismatch Volume: 162mL Infarction Location:Right MCA distribution IMPRESSION: CT brain perfusion: 1. Core infarct 28 cc, right MCA distribution. 2. Ischemic penumbra 129 cc. CTA head: 1. Right M1 proximal occlusion. Poor right MCA distribution collateralization. 2. Otherwise patent anterior and posterior circulations without aneurysm or occlusion. 3. Bilateral ICA paraclinoid and left P1/2 mild stenosis. 4. Bilateral V4 calcified plaque with severe right and moderate left stenosis. CTA neck: 1. Right proximal ICA moderate 50-70%  stenosis with dense calcified plaque. 2. Left proximal ICA mild to moderate 50% stenosis with dense calcified plaque. 3. No significant stenosis of vertebral arteries. 4. No dissection or aneurysm of carotid or vertebral arteries in the neck. 5. Ground-glass opacities in lung apices, probably pulmonary edema, possibly neurogenic given stroke. Small effusions. These results were called by telephone at the time of interpretation on 11/13/2017 at 10:42 pm to Dr. Amie Portland , who verbally acknowledged these results. Electronically Signed   By: Kristine Garbe M.D.   On: 11/13/2017 22:56   Dg Chest Port 1 View  Result Date: 11/14/2017 CLINICAL DATA:  Pneumonia.  Hypertension and atrial fibrillation. EXAM: PORTABLE CHEST 1 VIEW COMPARISON:  11/03/2017 FINDINGS: The heart is enlarged but appears stable. Stable tortuosity and calcification of the thoracic aorta. Vascular congestion and diffuse interstitial edema and probable small right pleural effusion. Findings suggest CHF. IMPRESSION: CHF. Electronically Signed   By: Marijo Sanes M.D.   On: 11/14/2017 08:43   Ct Head Code Stroke Wo Contrast  Result Date: 11/13/2017 CLINICAL DATA:  Code stroke.  77 y/o  F; left-sided facial droop. EXAM: CT HEAD WITHOUT CONTRAST TECHNIQUE: Contiguous axial images were obtained from the base of the skull through the vertex without intravenous contrast. COMPARISON:  None. FINDINGS: Brain: Hypoattenuation is present within the right temporal lobe, insula, putamen, and caudate body with loss of gray-white differentiation likely representing an acute infarction. Aspects is 6. No acute hemorrhage. No significant mass effect. No other stroke, focal mass effect, or brain parenchymal lesion identified. Vascular: Right dense M1 likely thrombus. Skull: Normal. Negative for fracture or focal lesion. Sinuses/Orbits: No acute finding. Other: None. ASPECTS [facility] Stroke Program Early CT Score) - Ganglionic level infarction (caudate,  lentiform nuclei, internal capsule, insula, M1-M3 cortex): 3 - Supraganglionic infarction (M4-M6 cortex): 3 Total score (0-10 with 10 being normal): 6 IMPRESSION: 1. Right MCA distribution hypoattenuation with loss of gray-white differentiation compatible with acute infarct. No hemorrhage or mass effect. 2. ASPECTS is 6 3. Right M1 dense vessel, likely thrombus. These results were called by telephone at the time of interpretation on 11/13/2017 at 10:22 pm to Dr. Amie Portland , who verbally acknowledged these results. Electronically Signed   By: Kristine Garbe M.D.   On: 11/13/2017 22:27    Labs:  CBC: Recent  Labs    11/03/17 1517 11/13/17 2207 11/13/17 2211 11/14/17 0500 11/15/17 0427  WBC 10.2 8.0  --  6.6 6.9  HGB 10.0* 10.3* 10.9* 9.0* 8.9*  HCT 33.0* 33.7* 32.0* 28.4* 28.7*  PLT 367 310  --  280 278    COAGS: Recent Labs    12/17/16 1118 11/03/17 1517 11/13/17 2207  INR 1.2 >10.00* 1.04  APTT  --   --  31    BMP: Recent Labs    11/03/17 1517 11/13/17 2207 11/13/17 2211 11/14/17 0500 11/14/17 1456 11/15/17 0427  NA 139 142 143 141  --  139  K 3.7 2.8* 2.8* 2.6* 3.0* 3.2*  CL 104 106 103 105  --  105  CO2 23 24  --  23  --  21*  GLUCOSE 105* 102* 99 100*  --  83  BUN 36* 8 9 8   --  9  CALCIUM 8.3* 8.9  --  8.0*  --  8.4*  CREATININE 1.44* 1.32* 1.30* 1.23*  --  1.50*  GFRNONAA 34* 38*  --  41*  --  32*  GFRAA 39* 44*  --  47*  --  37*    LIVER FUNCTION TESTS: Recent Labs    11/03/17 1517 11/13/17 2207  BILITOT 0.7 0.8  AST 30 39  ALT 26 29  ALKPHOS 69 73  PROT 5.8* 6.4*  ALBUMIN 2.5* 2.8*    Assessment and Plan:  CVA R MCA revascularization Doing well Plan for dc today per Dr Leonie Man  Electronically Signed: Khale Nigh A, PA-C 11/15/2017, 8:00 AM   I spent a total of 15 Minutes at the the patient's bedside AND on the patient's hospital floor or unit, greater than 50% of which was counseling/coordinating care for CVA- R MCA  revasc

## 2017-11-15 NOTE — Progress Notes (Signed)
   11/15/17 0958  Clinical Encounter Type  Visited With Patient and family together  Visit Type Initial  Spiritual Encounters  Spiritual Needs Prayer   Encountered the husband in the doorway and visited the with he and his wife.  Shared some of the story as to why they are here.  Feel very fortunate that she is improving and home soon.  They have pastoral support with their church.  Will follow as needed. Chaplain Katherene Ponto

## 2017-11-15 NOTE — Progress Notes (Signed)
ANTICOAGULATION CONSULT NOTE - Initial Consult  Pharmacy Consult for apixaban Indication: atrial fibrillation  Allergies  Allergen Reactions  . Benazepril Cough  . Diltiazem Hcl Rash    Patient Measurements: Height: 5\' 2"  (157.5 cm) Weight: 208 lb 8.9 oz (94.6 kg) IBW/kg (Calculated) : 50.1  Vital Signs: Temp: 97.5 F (36.4 C) (02/08 0830) Temp Source: Oral (02/08 0830) BP: 153/66 (02/08 0800) Pulse Rate: 59 (02/08 0800)  Labs: Recent Labs    11/13/17 2207 11/13/17 2211 11/14/17 0500 11/15/17 0427  HGB 10.3* 10.9* 9.0* 8.9*  HCT 33.7* 32.0* 28.4* 28.7*  PLT 310  --  280 278  APTT 31  --   --   --   LABPROT 13.5  --   --   --   INR 1.04  --   --   --   CREATININE 1.32* 1.30* 1.23* 1.50*    Estimated Creatinine Clearance: 33.1 mL/min (A) (by C-G formula based on SCr of 1.5 mg/dL (H)).   Medical History: Past Medical History:  Diagnosis Date  . Allergic rhinitis   . Anxiety   . Atrial fibrillation ([place])   . Barrett's esophagus   . Breast cancer ([place]) 2005   "left"  . Chronic lower back pain   . Chronic systolic CHF (congestive heart failure) (HCC)    a. EF 30-35% felt to be possibly be tachy mediated from afib wtih RVR  . Depression   . Diverticulosis   . GERD (gastroesophageal reflux disease)   . Hiatal hernia   . Hypertension   . Hypothyroidism   . Left atrial enlargement   . Mitral regurgitation   . OAB (overactive bladder)   . Persistent atrial fibrillation ([place]) 10/31/2016   a. s/p failed DCCV x3. Now on amiodarone.   . 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)    Medications:  Scheduled:  .  stroke: mapping our early stages of recovery book   Does not apply Once  . amiodarone  300 mg Oral Daily  . apixaban  5 mg Oral BID  . atorvastatin  20 mg Oral q1800  . ferrous sulfate  325 mg Oral Daily  . FLUoxetine  40 mg Oral Daily  . furosemide  40 mg Oral BID  . levothyroxine  225 mcg Oral QAC breakfast  . metoprolol  tartrate  25 mg Oral BID  . polyethylene glycol  17 g Oral QPC supper  . potassium chloride SA  20 mEq Oral BID  . senna-docusate  2 tablet Oral QHS    Assessment: 62 yof was previously on Xarelto which has since been held due to bleeding post-procedure. Admitted with large MCA stroke s/p IR thrombectomy. Has hx of Afib.  Plan to transition to apixaban. Has only been receiving subQ heparin while in hospital (last dose on 2/8 at 0600). Hgb has decreased from 10.9 to 8.9 today. Platelet function remains stable. No signs/symptoms of bleeding. Will dose full dose apixaban since age<80 yr, weight>60 kg, despite Scr 1.5 today.  Goal of Therapy:  Monitor platelets by anticoagulation protocol: Yes   Plan:  Apixaban 5 mg twice daily Monitor renal function, CBC  Monitor for signs/symptoms of bleeding.   Doylene Canard, PharmD Clinical Pharmacist  Pager: [phone] Clinical Phone for 11/15/2017 until 3:30pm: [phone] If after 3:30pm, please call main pharmacy at [phone] 11/15/2017,9:45 AM

## 2017-11-15 NOTE — Progress Notes (Signed)
  Echocardiogram 2D Echocardiogram has been performed.  Teresa York 11/15/2017, 9:40 AM

## 2017-11-15 NOTE — Progress Notes (Signed)
Discharge education and medication instructions explained and given to patient along with husband. No questions or concerns voiced by the patient or family.  Patient discharged home with husband.

## 2017-11-15 NOTE — Progress Notes (Signed)
Physical Therapy Treatment Patient Details Name: Teresa York MRN: [id number] DOB: [birth year] Today's Date: 11/15/2017    History of Present Illness 77 yo admitted with left weakness with R MCA CVA s/p revascularization. PMHx: Afib, HTN, CHF, EF 30-50%, chronic LBP    PT Comments    Pt moving well with increased gait tolerance, ability to perform stairs and improved activity regulation today. Spouse stating that pt has decreased motivation for activity at baseline and pt educated for importance of walking and mobility acutely and with return home, pt educated for HEP and continued progression. Will continue to follow.     Follow Up Recommendations  Home health PT;Supervision/Assistance - 24 hour     Equipment Recommendations  None recommended by PT    Recommendations for Other Services       Precautions / Restrictions Precautions Precautions: Fall Restrictions Weight Bearing Restrictions: No    Mobility  Bed Mobility Overal bed mobility: Modified Independent             General bed mobility comments: increased time  Transfers Overall transfer level: Needs assistance   Transfers: Sit to/from Stand Sit to Stand: Min guard         General transfer comment: cues for hand placement and safety rising from bed and toilet with rail   Ambulation/Gait Ambulation/Gait assistance: Min guard Ambulation Distance (Feet): 170 Feet Assistive device: Rolling walker (2 wheeled) Gait Pattern/deviations: Step-through pattern;Decreased stride length;Trunk flexed   Gait velocity interpretation: Below normal speed for age/gender General Gait Details: cues for position in RW and safety pt with tendency to step out of RW with turning, good self-regulation this session   Stairs Stairs: Yes   Stair Management: Step to pattern;One rail Left;Forwards Number of Stairs: 4 General stair comments: pt able to ascend and descend 4 stairs, up with left rail and HHA, down with bil UE on  P.T. shoulders. Pt educated for sequence and safety at home as well as recommendation for installation of rail on outside of home  Wheelchair Mobility    Modified Rankin (Stroke Patients Only) Modified Rankin (Stroke Patients Only) Pre-Morbid Rankin Score: No significant disability Modified Rankin: Moderate disability     Balance Overall balance assessment: Needs assistance Sitting-balance support: No upper extremity supported Sitting balance-Leahy Scale: Fair       Standing balance-Leahy Scale: Poor Standing balance comment: reliant on RW                            Cognition Arousal/Alertness: Awake/alert Behavior During Therapy: Flat affect Overall Cognitive Status: Impaired/Different from baseline Area of Impairment: Following commands                       Following Commands: Follows multi-step commands with increased time     Problem Solving: Slow processing        Exercises      General Comments        Pertinent Vitals/Pain Pain Assessment: No/denies pain    Home Living                      Prior Function            PT Goals (current goals can now be found in the care plan section) Progress towards PT goals: Progressing toward goals    Frequency           PT Plan Current plan remains appropriate  Co-evaluation              AM-PAC PT "6 Clicks" Daily Activity  Outcome Measure  Difficulty turning over in bed (including adjusting bedclothes, sheets and blankets)?: A Little Difficulty moving from lying on back to sitting on the side of the bed? : A Little Difficulty sitting down on and standing up from a chair with arms (e.g., wheelchair, bedside commode, etc,.)?: A Little Help needed moving to and from a bed to chair (including a wheelchair)?: A Little Help needed walking in hospital room?: A Little Help needed climbing 3-5 steps with a railing? : A Lot 6 Click Score: 17    End of Session Equipment  Utilized During Treatment: Gait belt Activity Tolerance: Patient tolerated treatment well Patient left: in chair;with call bell/phone within reach;with chair alarm set;with family/visitor present Nurse Communication: Mobility status PT Visit Diagnosis: Other abnormalities of gait and mobility (R26.89);Muscle weakness (generalized) (M62.81)     Time: 1660-6301 PT Time Calculation (min) (ACUTE ONLY): 32 min  Charges:  $Gait Training: 8-22 mins $Therapeutic Activity: 8-22 mins                    G Codes:       Elwyn Reach, PT [phone]    [place] 11/15/2017, 11:54 AM

## 2017-11-18 ENCOUNTER — Ambulatory Visit: Payer: Medicare HMO | Admitting: Internal Medicine

## 2017-11-18 ENCOUNTER — Encounter: Payer: Self-pay | Admitting: Internal Medicine

## 2017-11-18 VITALS — BP 148/68 | HR 60 | Ht 62.0 in | Wt 204.0 lb

## 2017-11-18 DIAGNOSIS — G4733 Obstructive sleep apnea (adult) (pediatric): Secondary | ICD-10-CM | POA: Diagnosis not present

## 2017-11-18 DIAGNOSIS — I481 Persistent atrial fibrillation: Secondary | ICD-10-CM

## 2017-11-18 DIAGNOSIS — I4819 Other persistent atrial fibrillation: Secondary | ICD-10-CM

## 2017-11-18 NOTE — Patient Instructions (Signed)
Medication Instructions:  Your physician has recommended you make the following change in your medication:   STOP: Aspirin  Labwork: None ordered  Testing/Procedures: None ordered  Follow-Up: Your physician recommends that you schedule a follow-up appointment in: 4 weeks with Dr. Rayann Heman    Any Other Special Instructions Will Be Listed Below (If Applicable).     If you need a refill on your cardiac medications before your next appointment, please call your pharmacy.

## 2017-11-18 NOTE — Progress Notes (Signed)
PCP: Maylon Peppers, MD Primary Cardiologist: Dr [name] Primary EP: Dr Francia Greaves Teresa York is a 77 y.o. female who presents today for routine electrophysiology followup.  She recently had large MCA stroke 2/19 while off of Xarelto after GI bleed.  She reports UGI bleed requiring EGD and intervention at [facility].  Her xarelto was discontinued.  She presented to [facility] 11/13/17 with large MCA stroke requiring embolectomy.  She was noted to have afib during that hospitalization.  She was placed on eliquis and ASA.  She is making slow recovery.  She is tired.  Having trouble using CPAP.  Today, she denies symptoms of palpitations, chest pain, shortness of breath,  lower extremity edema, dizziness, presyncope, or syncope.  The patient is otherwise without complaint today.   Past Medical History:  Diagnosis Date  . Allergic rhinitis   . Anxiety   . Atrial fibrillation ([place])   . Barrett's esophagus   . Breast cancer ([place]) 2005   "left"  . Chronic lower back pain   . Chronic systolic CHF (congestive heart failure) (HCC)    a. EF 30-35% felt to be possibly be tachy mediated from afib wtih RVR  . Depression   . Diverticulosis   . GERD (gastroesophageal reflux disease)   . Hiatal hernia   . Hypertension   . Hypothyroidism   . Left atrial enlargement   . Mitral regurgitation   . OAB (overactive bladder)   . Persistent atrial fibrillation ([place]) 10/31/2016   a. s/p failed DCCV x3. Now on amiodarone.   . 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)   Past Surgical History:  Procedure Laterality Date  . BACK SURGERY    . BREAST BIOPSY Left 2005  . BREAST LUMPECTOMY Left 2005  . CARDIOVERSION N/A 11/02/2016   Procedure: CARDIOVERSION;  Surgeon: Thayer Headings, MD;  Location: [place];  Service: Cardiovascular;  Laterality: N/A;  . CARDIOVERSION N/A 11/16/2016   Procedure: CARDIOVERSION;  Surgeon: Fay Records, MD;  Location: [place];  Service: Cardiovascular;   Laterality: N/A;  . CARDIOVERSION N/A 12/24/2016   Procedure: CARDIOVERSION;  Surgeon: Sanda Klein, MD;  Location: [facility];  Service: Cardiovascular;  Laterality: N/A;  . CARPAL TUNNEL RELEASE Bilateral 1995  . COLONOSCOPY    . ELBOW FRACTURE SURGERY Right 2009   with implant  . ESOPHAGOGASTRODUODENOSCOPY    . ESOPHAGUS SURGERY     "in the process of getting cancerous cell off my esophagus" (10/31/2016)  . FRACTURE SURGERY    . IR CT HEAD LTD  11/14/2017  . IR PERCUTANEOUS ART THROMBECTOMY/INFUSION INTRACRANIAL INC DIAG ANGIO  11/14/2017  . LUMBAR DISC SURGERY     L5  . MOHS SURGERY     "corner of right eye; on left cheek"  . RADIOLOGY WITH ANESTHESIA N/A 11/13/2017   Procedure: IR WITH ANESTHESIA;  Surgeon: Radiologist, Medication, MD;  Location: [place];  Service: Radiology;  Laterality: N/A;  . TEE WITHOUT CARDIOVERSION N/A 11/16/2016   Procedure: TRANSESOPHAGEAL ECHOCARDIOGRAM (TEE);  Surgeon: Fay Records, MD;  Location: [place];  Service: Cardiovascular;  Laterality: N/A;  . TONSILLECTOMY    . TUBAL LIGATION  1980    ROS- all systems are reviewed and negatives except as per HPI above  Current Outpatient Medications  Medication Sig Dispense Refill  . amiodarone (PACERONE) 100 MG tablet Take 1 tablet (100 mg total) by mouth daily. 30 tablet 1  . amiodarone (PACERONE) 200 MG tablet Take 1  tablet (200 mg total) by mouth daily. 30 tablet 1  . apixaban (ELIQUIS) 5 MG TABS tablet Take 1 tablet (5 mg total) by mouth 2 (two) times daily. 60 tablet 1  . aspirin EC 81 MG EC tablet Take 1 tablet (81 mg total) by mouth daily. 30 tablet 1  . atorvastatin (LIPITOR) 20 MG tablet Take 1 tablet (20 mg total) by mouth daily at 6 PM. 30 tablet 1  . cetirizine (ZYRTEC) 10 MG tablet Take 10 mg by mouth daily. Reported on 10/27/2015    . Cholecalciferol (D 1000) 1000 units capsule Take 1,000 Units by mouth daily. Reported on 11/03/2015    . ferrous sulfate (SM IRON) 325 (65 FE) MG tablet Take 325  mg by mouth daily.     Marland Kitchen FLUoxetine (PROZAC) 40 MG capsule Take 40 mg by mouth daily.     . furosemide (LASIX) 40 MG tablet Take 1 tablet (40 mg total) by mouth 2 (two) times daily. 60 tablet 7  . levothyroxine (SYNTHROID, LEVOTHROID) 150 MCG tablet Take 225 mcg by mouth See admin instructions. Taking one and one-half tablets (282mcg total) once daily    . metoprolol tartrate (LOPRESSOR) 25 MG tablet Take 25 mg by mouth 2 (two) times daily.    . Multiple Vitamins-Minerals (OCUVITE PRESERVISION PO) Take 1 tablet by mouth daily.    Marland Kitchen omeprazole (PRILOSEC) 40 MG capsule Take 40 mg by mouth 2 (two) times daily.    . potassium chloride SA (K-DUR,KLOR-CON) 20 MEQ tablet Take 1 tablet (20 mEq total) by mouth 2 (two) times daily. 60 tablet 3   No current facility-administered medications for this visit.     Physical Exam: Vitals:   11/18/17 1441  BP: (!) 148/68  Pulse: 60  Weight: 204 lb (92.5 kg)  Height: 5\' 2"  (1.575 m)    GEN- The patient is obese appearing, alert and oriented x 3 today.   Head- normocephalic, atraumatic Eyes-  Sclera clear, conjunctiva pink Ears- hearing intact Oropharynx- clear Lungs- Clear to ausculation bilaterally, normal work of breathing Heart- Regular rate and rhythm, no murmurs, rubs or gallops, PMI not laterally displaced GI- soft, NT, ND, + BS Extremities- no clubbing, cyanosis, or edema Neuro- L facial droop, 4/5 strength L upper and lower extremity  Assessment and Plan:  1. Persistent afib On amiodarone 300mg  daily Continue current dose until return in 4 weeks Continue eliquis.  Stop ASA given bleeding risks She has had moderate MR in the past.  Would eventually consider LAA given her bleeding risks with concomitant MAZE and possible MR repair.  Will obtain TEE after she makes recovery from her current stoke  2. OSA Unable to use CPAP I have encouraged her to follow-up with Dr Maxwell Caul  Return to see me in 4 weeks  Thompson Grayer MD,  [facility] 11/18/2017 2:45 PM

## 2017-11-25 ENCOUNTER — Other Ambulatory Visit: Payer: Self-pay

## 2017-11-25 NOTE — Patient Outreach (Signed)
[facility]) Care Management  11/25/2017  Tishara Pizano 1941-04-25 [id number]   EMMI- General Discharge RED ON EMMI ALERT Day # 6 & #9 Date: 11/24/17 Red Alert Reason:  Day #6 Feeling worse overall- yes Day #9 Lost interest in things they used to enjoy- yes Sad, hopeless, anxious, or empty? Yes  Telephone call for EMMI follow up.  No answer.  HIPAA compliant voice message left.  Plan: RN CM will attempt patient again within one business day.  Jone Baseman, RN, MSN [facility] Care Management Care Management Coordinator Direct Line [phone] Toll Free: [phone]  Fax: [phone]

## 2017-11-26 ENCOUNTER — Other Ambulatory Visit: Payer: Self-pay

## 2017-11-26 NOTE — Patient Outreach (Addendum)
[place] [facility]) Care Management  11/26/2017  Teresa York Apr 13, 1941 [id number]   EMMI- General Discharge RED ON EMMI ALERT Day # 6 & #9 Date:11/24/17 Red Alert Reason: Day #6 Feeling worse overall- yes Day #9 Lost interest in things they used to enjoy- yes Sad, hopeless, anxious, or empty? Yes   2nd telephone call to patient.  She is able to verify HIPAA.  Patient reports she is doing ok.  Addressed red EMMI alerts with patient.  She reports that on Sunday she was having some neck discomfort where she could not turn her neck.  She states that she feels better now though.  Discussed with patient depression and completed the PHQ-9 with her.  Patient score was a 16. Patient shares that she is on Prozac and Wellbutrin. She states she has been on Prozac for years and that her doctor just started on Wellbutrin on yesterday.    Patient has seen primary care doctor and heart physician and will see neurologist in 2 weeks.  Patient offers no other concerns at this time.    Advised patient that they would continue to get automated EMMI-Stroke post discharge calls to assess how they are doing following recent hospitalization and will receive a call from a nurse if any of their responses were abnormal. Patient voiced understanding.    Plan: RN CM will close case at this time and notify care management assistant of case status.  Jone Baseman, RN, MSN [facility] Care Management Care Management Coordinator Direct Line [phone] Toll Free: [phone]  Fax: [phone]

## 2017-11-27 ENCOUNTER — Telehealth: Payer: Self-pay | Admitting: Internal Medicine

## 2017-11-27 NOTE — Telephone Encounter (Signed)
Called patient about her symptoms. Patient stated she started gaining weight and having swelling in her feet after her PCP, Dr. Lenna Gilford changed her Lasix from 40 mg to 20 mg BID. Encouraged patient to call Dr. Lenna Gilford office to let them know about her symptoms after the change in her medications. Patient verbalized understanding and will call Dr. Lenna Gilford now.   Called Don with [facility], left message that patient would be calling her PCP.

## 2017-11-27 NOTE — Telephone Encounter (Signed)
New message   They are faxing over her vitals so that you can look over what they have from 11/08/17  Pt c/o swelling: STAT is pt has developed SOB within 24 hours  1) How much weight have you gained and in what time span?  Wt 204 last week (2/11) goes up and down   2) If swelling, where is the swelling located?  Yesterday her feet were swollen , she could not bend her ankles   3) Are you currently taking a fluid pill? Yes   4) Are you currently SOB? Several weeks with exertion  5) Do you have a log of your daily weights (if so, list)? 204.2 today 2/18 198.7 2/15 200 2/14 205.9 2/13 201 2/11 205.9   6) Have you gained 3 pounds in a day or 5 pounds in a week? 5lbs in a week   7) Have you traveled recently? no

## 2017-11-28 ENCOUNTER — Other Ambulatory Visit: Payer: Self-pay

## 2017-11-28 NOTE — Patient Outreach (Signed)
[facility] [facility]) Care Management  11/28/2017  Teresa York 1941/01/15 [id number]   EMMI-General Discharge RED ON EMMI ALERT Day  #9 Date:11/27/17 Red Alert Reason: Lost interest in things they used to enjoy- yes Sad, hopeless, anxious, or empty? Yes  EMMI red alert is a duplicate and addressed alerts on 11-24-17.  Notified care management assistant Alycia Rossetti of possible EMMI issue.  Plan: RN CM will close case at this time.    Jone Baseman, RN, MSN [facility] Care Management Care Management Coordinator Direct Line [phone] Toll Free: [phone]  Fax: [phone]

## 2017-12-02 ENCOUNTER — Telehealth: Payer: Self-pay | Admitting: Pharmacist

## 2017-12-02 NOTE — Telephone Encounter (Signed)
Received clearance from [facility] that pt is scheduled for an EGD with request to hold Eliquis. Pt takes Eliquis for afib with CHADS2VASc score of 7 (sex, age x2, CHF, HTN, stroke). Large MCA stroke occurred 2/19 while off of Xarelto after GI bleed. Since stroke was 3 weeks ago, will not likely be able to hold Eliquis for EGD. To Dr Rayann Heman for recommendation.

## 2017-12-05 NOTE — Telephone Encounter (Signed)
I agree that we should not hold anticoagulation.  Could consider lovenox bridge if absolutely necessary.  Given recent stroke, might be best to defer EGD until a later time if able.

## 2017-12-06 NOTE — Telephone Encounter (Addendum)
Will route clearance to [facility] with recommendation to postpone EGD since pt cannot hold anticoagulation due to recent stroke. Fax# [phone]

## 2017-12-18 ENCOUNTER — Ambulatory Visit: Payer: Medicare HMO | Admitting: Internal Medicine

## 2017-12-27 MED ORDER — PANTOPRAZOLE SODIUM 40 MG PO TBEC
40.00 | DELAYED_RELEASE_TABLET | ORAL | Status: DC
Start: 2017-12-28 — End: 2017-12-27

## 2017-12-27 MED ORDER — LEVOTHYROXINE SODIUM 150 MCG PO TABS
150.00 | ORAL_TABLET | ORAL | Status: DC
Start: 2017-12-28 — End: 2017-12-27

## 2017-12-27 MED ORDER — FLUOXETINE HCL 20 MG PO CAPS
40.00 | ORAL_CAPSULE | ORAL | Status: DC
Start: 2017-12-28 — End: 2017-12-27

## 2017-12-27 MED ORDER — LORATADINE 10 MG PO TABS
10.00 | ORAL_TABLET | ORAL | Status: DC
Start: 2017-12-28 — End: 2017-12-27

## 2017-12-27 MED ORDER — AMIODARONE HCL 200 MG PO TABS
300.00 | ORAL_TABLET | ORAL | Status: DC
Start: 2017-12-28 — End: 2017-12-27

## 2017-12-27 MED ORDER — METOPROLOL SUCCINATE ER 25 MG PO TB24
12.50 | ORAL_TABLET | ORAL | Status: DC
Start: 2017-12-28 — End: 2017-12-27

## 2017-12-27 MED ORDER — ACETAMINOPHEN 325 MG PO TABS
650.00 | ORAL_TABLET | ORAL | Status: DC
Start: ? — End: 2017-12-27

## 2017-12-27 MED ORDER — BENZONATATE 100 MG PO CAPS
100.00 | ORAL_CAPSULE | ORAL | Status: DC
Start: ? — End: 2017-12-27

## 2017-12-27 MED ORDER — TRAZODONE HCL 50 MG PO TABS
50.00 | ORAL_TABLET | ORAL | Status: DC
Start: 2017-12-27 — End: 2017-12-27

## 2017-12-27 MED ORDER — BUPROPION HCL ER (XL) 150 MG PO TB24
150.00 | ORAL_TABLET | ORAL | Status: DC
Start: 2017-12-28 — End: 2017-12-27

## 2017-12-27 MED ORDER — NYSTATIN 100000 UNIT/GM EX POWD
CUTANEOUS | Status: DC
Start: 2017-12-27 — End: 2017-12-27

## 2017-12-27 MED ORDER — HYDRALAZINE HCL 10 MG PO TABS
10.00 | ORAL_TABLET | ORAL | Status: DC
Start: ? — End: 2017-12-27

## 2017-12-27 MED ORDER — FUROSEMIDE 40 MG PO TABS
40.00 | ORAL_TABLET | ORAL | Status: DC
Start: 2017-12-28 — End: 2017-12-27

## 2017-12-27 MED ORDER — POTASSIUM CHLORIDE CRYS ER 20 MEQ PO TBCR
EXTENDED_RELEASE_TABLET | ORAL | Status: DC
Start: 2017-12-28 — End: 2017-12-27

## 2017-12-27 MED ORDER — IPRATROPIUM BROMIDE 0.02 % IN SOLN
0.50 | RESPIRATORY_TRACT | Status: DC
Start: ? — End: 2017-12-27

## 2017-12-27 MED ORDER — ONDANSETRON 4 MG PO TBDP
4.00 | ORAL_TABLET | ORAL | Status: DC
Start: ? — End: 2017-12-27

## 2017-12-27 MED ORDER — ALBUTEROL SULFATE (5 MG/ML) 0.5% IN NEBU
2.50 | INHALATION_SOLUTION | RESPIRATORY_TRACT | Status: DC
Start: ? — End: 2017-12-27

## 2017-12-27 MED ORDER — GENERIC EXTERNAL MEDICATION
4.00 | Status: DC
Start: 2017-12-27 — End: 2017-12-27

## 2017-12-27 MED ORDER — FLUTICASONE PROPIONATE 50 MCG/ACT NA SUSP
1.00 | NASAL | Status: DC
Start: 2017-12-28 — End: 2017-12-27

## 2017-12-27 MED ORDER — ATORVASTATIN CALCIUM 10 MG PO TABS
20.00 | ORAL_TABLET | ORAL | Status: DC
Start: 2017-12-27 — End: 2017-12-27

## 2017-12-27 MED ORDER — APIXABAN 5 MG PO TABS
5.00 | ORAL_TABLET | ORAL | Status: DC
Start: 2017-12-27 — End: 2017-12-27

## 2018-01-15 ENCOUNTER — Ambulatory Visit: Payer: Medicare HMO | Admitting: Internal Medicine

## 2018-01-15 ENCOUNTER — Encounter: Payer: Self-pay | Admitting: Internal Medicine

## 2018-01-15 VITALS — BP 124/82 | HR 78 | Ht 62.0 in | Wt 189.0 lb

## 2018-01-15 DIAGNOSIS — G4733 Obstructive sleep apnea (adult) (pediatric): Secondary | ICD-10-CM

## 2018-01-15 DIAGNOSIS — I481 Persistent atrial fibrillation: Secondary | ICD-10-CM | POA: Diagnosis not present

## 2018-01-15 DIAGNOSIS — I4819 Other persistent atrial fibrillation: Secondary | ICD-10-CM

## 2018-01-15 MED ORDER — AMIODARONE HCL 200 MG PO TABS: 200.0000 mg | ORAL_TABLET | Freq: Every day | ORAL | 3 refills | Status: DC

## 2018-01-15 NOTE — Patient Instructions (Addendum)
Medication Instructions:  Your physician has recommended you make the following change in your medication:  1.  Reduce your amiodarone to 200 mg one tablet by mouth daily.  Labwork: None ordered.  Testing/Procedures: None ordered.  Follow-Up:  Please change patient's follow up appointment with Dr. [name] from August to 6 weeks from today.  Your physician wants you to follow-up in: 3 months with Dr. Rayann Heman.     Any Other Special Instructions Will Be Listed Below (If Applicable).  If you need a refill on your cardiac medications before your next appointment, please call your pharmacy.

## 2018-01-15 NOTE — Progress Notes (Signed)
PCP: Maylon Peppers, MD Primary Cardiologist: Dr [name] Primary EP: Dr Francia Greaves Teresa York is a 77 y.o. female who presents today for routine electrophysiology followup.  Since last being seen in our clinic, the patient reports doing poorly.  She is making very slow recovery from her stroke.  She has fallen several times.  She has had IC bleed and is no longer taking eliquis.  She sees Dr Leonie Man soon and hopes to be able to resume anticoagulation soon.  She has unsteadiness.  She also has residual L leg weakness with dependant edema.  Today, she denies symptoms of palpitations, chest pain, shortness of breath, presyncope, or syncope.  The patient is otherwise without complaint today.   Past Medical History:  Diagnosis Date  . Allergic rhinitis   . Anxiety   . Atrial fibrillation ([place])   . Barrett's esophagus   . Breast cancer ([place]) 2005   "left"  . Chronic lower back pain   . Chronic systolic CHF (congestive heart failure) (HCC)    a. EF 30-35% felt to be possibly be tachy mediated from afib wtih RVR  . Depression   . Diverticulosis   . GERD (gastroesophageal reflux disease)   . Hiatal hernia   . Hypertension   . Hypothyroidism   . Left atrial enlargement   . Mitral regurgitation   . OAB (overactive bladder)   . Persistent atrial fibrillation ([place]) 10/31/2016   a. s/p failed DCCV x3. Now on amiodarone.   . 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)   Past Surgical History:  Procedure Laterality Date  . BACK SURGERY    . BREAST BIOPSY Left 2005  . BREAST LUMPECTOMY Left 2005  . CARDIOVERSION N/A 11/02/2016   Procedure: CARDIOVERSION;  Surgeon: Thayer Headings, MD;  Location: [facility];  Service: Cardiovascular;  Laterality: N/A;  . CARDIOVERSION N/A 11/16/2016   Procedure: CARDIOVERSION;  Surgeon: Fay Records, MD;  Location: [facility];  Service: Cardiovascular;  Laterality: N/A;  . CARDIOVERSION N/A 12/24/2016   Procedure: CARDIOVERSION;  Surgeon:  Sanda Klein, MD;  Location: [facility];  Service: Cardiovascular;  Laterality: N/A;  . CARPAL TUNNEL RELEASE Bilateral 1995  . COLONOSCOPY    . ELBOW FRACTURE SURGERY Right 2009   with implant  . ESOPHAGOGASTRODUODENOSCOPY    . ESOPHAGUS SURGERY     "in the process of getting cancerous cell off my esophagus" (10/31/2016)  . FRACTURE SURGERY    . IR CT HEAD LTD  11/14/2017  . IR PERCUTANEOUS ART THROMBECTOMY/INFUSION INTRACRANIAL INC DIAG ANGIO  11/14/2017  . LUMBAR DISC SURGERY     L5  . MOHS SURGERY     "corner of right eye; on left cheek"  . RADIOLOGY WITH ANESTHESIA N/A 11/13/2017   Procedure: IR WITH ANESTHESIA;  Surgeon: Radiologist, Medication, MD;  Location: [facility];  Service: Radiology;  Laterality: N/A;  . TEE WITHOUT CARDIOVERSION N/A 11/16/2016   Procedure: TRANSESOPHAGEAL ECHOCARDIOGRAM (TEE);  Surgeon: Fay Records, MD;  Location: [facility];  Service: Cardiovascular;  Laterality: N/A;  . TONSILLECTOMY    . TUBAL LIGATION  1980    ROS- all systems are reviewed and negatives except as per HPI above  Current Outpatient Medications  Medication Sig Dispense Refill  . amiodarone (PACERONE) 200 MG tablet Take 1 tablet (200 mg total) by mouth daily. 30 tablet 1  . atorvastatin (LIPITOR) 20 MG tablet Take 1 tablet (20 mg total) by mouth daily at 6 PM. 30  tablet 1  . cetirizine (ZYRTEC) 10 MG tablet Take 10 mg by mouth daily. Reported on 10/27/2015    . Cholecalciferol (D 1000) 1000 units capsule Take 1,000 Units by mouth daily. Reported on 11/03/2015    . ferrous sulfate (SM IRON) 325 (65 FE) MG tablet Take 325 mg by mouth daily.     Marland Kitchen FLUoxetine (PROZAC) 20 MG capsule Take 40 mg by mouth daily. For 30 days    . furosemide (LASIX) 20 MG tablet Take 20 mg by mouth daily. For 30 days    . Lactobacillus (FLORANEX PO) Take 4 tablets by mouth 3 (three) times daily. For 30 days    . levothyroxine (SYNTHROID, LEVOTHROID) 100 MCG tablet Take 100 mcg by mouth daily. For 30 days    .  magnesium oxide (MAG-OX) 400 MG tablet Take 400 mg by mouth 2 (two) times daily. For 30 days    . metoprolol succinate (TOPROL-XL) 25 MG 24 hr tablet Take 12.5 mg by mouth daily. For 30 days    . Multiple Vitamins-Minerals (ICAPS) CAPS Take 1 capsule by mouth daily.    Marland Kitchen omeprazole (PRILOSEC) 40 MG capsule Take 40 mg by mouth 2 (two) times daily.    . potassium chloride SA (K-DUR,KLOR-CON) 20 MEQ tablet Take 1 tablet (20 mEq total) by mouth 2 (two) times daily. 60 tablet 3  . traZODone (DESYREL) 50 MG tablet Take 50 mg by mouth at bedtime. For 30 days    . amiodarone (PACERONE) 100 MG tablet Take 1 tablet (100 mg total) by mouth daily. (Patient not taking: Reported on 01/15/2018) 30 tablet 1  . apixaban (ELIQUIS) 5 MG TABS tablet Take 1 tablet (5 mg total) by mouth 2 (two) times daily. (Patient not taking: Reported on 01/15/2018) 60 tablet 1   No current facility-administered medications for this visit.     Physical Exam: Vitals:   01/15/18 0816  BP: 124/82  Pulse: 78  Weight: 189 lb (85.7 kg)  Height: 5\' 2"  (1.575 m)    GEN- The patient is chronically ill appearing, alert and oriented x 3 today.   Head- normocephalic, atraumatic Eyes-  Sclera clear, conjunctiva pink Ears- hearing intact Oropharynx- clear Lungs- Clear to ausculation bilaterally, normal work of breathing Heart- irregular rate and rhythm  GI- soft, NT, ND, + BS Extremities- no clubbing, cyanosis + dependant edema  EKG tracing ordered today is personally reviewed and shows afib, V rate 78 bpm, incomplete RBBB, LVH  Assessment and Plan:  1. Persistent atrial fibrillation Reduce amiodarone to 200mg  daily for rate control Off eliquis due to recent IC bleed Hopefully can resume anticoagulation eventually.  Dr Leonie Man follows and will assist with this decision. Cannot pursue sinus currently given that she is not on anticoagulation  2. OSA Not complaint with CPAP  3. IC bleed Will defer to Dr Leonie Man  4. Moderate MR,  diastolic dysfunction Continue on lasix bmet from yesterday is reviewed.  K is ok  Follow-up with Dr [name] in 6 weeks I will see in 3 months  Thompson Grayer MD, [facility] 01/15/2018 8:46 AM

## 2018-01-22 ENCOUNTER — Ambulatory Visit: Payer: Medicare HMO | Admitting: Cardiology

## 2018-01-27 ENCOUNTER — Ambulatory Visit: Payer: Medicare HMO | Admitting: Neurology

## 2018-01-27 ENCOUNTER — Encounter: Payer: Self-pay | Admitting: Neurology

## 2018-01-27 VITALS — BP 145/86 | HR 68 | Ht 62.0 in | Wt 179.4 lb

## 2018-01-27 DIAGNOSIS — I63411 Cerebral infarction due to embolism of right middle cerebral artery: Secondary | ICD-10-CM

## 2018-01-27 NOTE — Patient Instructions (Signed)
I had a long d/w patient about her recent embolic stroke, atrial fibrillation recent stroke, risk for recurrent stroke/TIAs, personally independently reviewed imaging studies and stroke evaluation results and answered questions.Continue aspirin 81 mg daily  for secondary stroke prevention  Patient does not want to use anticoagulants since she had history of upper GI bleed and scalp hematoma  And falx subduralon Xarelto on eliquis.and maintain strict control of hypertension with blood pressure goal below 130/90, diabetes with hemoglobin A1c goal below 6.5% and lipids with LDL cholesterol goal below 70 mg/dL. I also advised the patient to eat a healthy diet with plenty of whole grains, cereals, fruits and vegetables, exercise regularly and maintain ideal body weight .repeat CT scan of the head in 2 weeks and if scalp hematoma and falx subdural have resolved will resume eliquis at that time. Patient may also want to consider alternatively watchman device if interested. I advised her to discuss this with Dr. Rayann Heman.Followup in the future with my nurse practitioner in 3 months or call earlier if necessary   Stroke Prevention Some medical conditions and behaviors are associated with a higher chance of having a stroke. You can help prevent a stroke by making nutrition, lifestyle, and other changes, including managing any medical conditions you may have. What nutrition changes can be made?  Eat healthy foods. You can do this by: ? Choosing foods high in fiber, such as fresh fruits and vegetables and whole grains. ? Eating at least 5 or more servings of fruits and vegetables a day. Try to fill half of your plate at each meal with fruits and vegetables. ? Choosing lean protein foods, such as lean cuts of meat, poultry without skin, fish, tofu, beans, and nuts. ? Eating low-fat dairy products. ? Avoiding foods that are high in salt (sodium). This can help lower blood pressure. ? Avoiding foods that have saturated  fat, trans fat, and cholesterol. This can help prevent high cholesterol. ? Avoiding processed and premade foods.  Follow your health care provider's specific guidelines for losing weight, controlling high blood pressure (hypertension), lowering high cholesterol, and managing diabetes. These may include: ? Reducing your daily calorie intake. ? Limiting your daily sodium intake to 1,500 milligrams (mg). ? Using only healthy fats for cooking, such as olive oil, canola oil, or sunflower oil. ? Counting your daily carbohydrate intake. What lifestyle changes can be made?  Maintain a healthy weight. Talk to your health care provider about your ideal weight.  Get at least 30 minutes of moderate physical activity at least 5 days a week. Moderate activity includes brisk walking, biking, and swimming.  Do not use any products that contain nicotine or tobacco, such as cigarettes and e-cigarettes. If you need help quitting, ask your health care provider. It may also be helpful to avoid exposure to secondhand smoke.  Limit alcohol intake to no more than 1 drink a day for nonpregnant women and 2 drinks a day for men. One drink equals 12 oz of beer, 5 oz of wine, or 1 oz of hard liquor.  Stop any illegal drug use.  Avoid taking birth control pills. Talk to your health care provider about the risks of taking birth control pills if: ? You are over 29 years old. ? You smoke. ? You get migraines. ? You have ever had a blood clot. What other changes can be made?  Manage your cholesterol levels. ? Eating a healthy diet is important for preventing high cholesterol. If cholesterol cannot be managed through  diet alone, you may also need to take medicines. ? Take any prescribed medicines to control your cholesterol as told by your health care provider.  Manage your diabetes. ? Eating a healthy diet and exercising regularly are important parts of managing your blood sugar. If your blood sugar cannot be managed  through diet and exercise, you may need to take medicines. ? Take any prescribed medicines to control your diabetes as told by your health care provider.  Control your hypertension. ? To reduce your risk of stroke, try to keep your blood pressure below 130/80. ? Eating a healthy diet and exercising regularly are an important part of controlling your blood pressure. If your blood pressure cannot be managed through diet and exercise, you may need to take medicines. ? Take any prescribed medicines to control hypertension as told by your health care provider. ? Ask your health care provider if you should monitor your blood pressure at home. ? Have your blood pressure checked every year, even if your blood pressure is normal. Blood pressure increases with age and some medical conditions.  Get evaluated for sleep disorders (sleep apnea). Talk to your health care provider about getting a sleep evaluation if you snore a lot or have excessive sleepiness.  Take over-the-counter and prescription medicines only as told by your health care provider. Aspirin or blood thinners (antiplatelets or anticoagulants) may be recommended to reduce your risk of forming blood clots that can lead to stroke.  Make sure that any other medical conditions you have, such as atrial fibrillation or atherosclerosis, are managed. What are the warning signs of a stroke? The warning signs of a stroke can be easily remembered as BEFAST.  B is for balance. Signs include: ? Dizziness. ? Loss of balance or coordination. ? Sudden trouble walking.  E is for eyes. Signs include: ? A sudden change in vision. ? Trouble seeing.  F is for face. Signs include: ? Sudden weakness or numbness of the face. ? The face or eyelid drooping to one side.  A is for arms. Signs include: ? Sudden weakness or numbness of the arm, usually on one side of the body.  S is for speech. Signs include: ? Trouble speaking (aphasia). ? Trouble  understanding.  T is for time. ? These symptoms may represent a serious problem that is an emergency. Do not wait to see if the symptoms will go away. Get medical help right away. Call your local emergency services (911 in the [place] Do not drive yourself to the hospital.  Other signs of stroke may include: ? A sudden, severe headache with no known cause. ? Nausea or vomiting. ? Seizure.  Where to find more information: For more information, visit:  American Stroke Association: www.strokeassociation.org  National Stroke Association: www.stroke.org  Summary  You can prevent a stroke by eating healthy, exercising, not smoking, limiting alcohol intake, and managing any medical conditions you may have.  Do not use any products that contain nicotine or tobacco, such as cigarettes and e-cigarettes. If you need help quitting, ask your health care provider. It may also be helpful to avoid exposure to secondhand smoke.  Remember BEFAST for warning signs of stroke. Get help right away if you or a loved one has any of these signs. This information is not intended to replace advice given to you by your health care provider. Make sure you discuss any questions you have with your health care provider. Document Released: 11/01/2004 Document Revised: 10/30/2016 Document Reviewed: 10/30/2016 Elsevier  Interactive Patient Education  [employer].

## 2018-01-27 NOTE — Progress Notes (Signed)
[facility] [address] [place]. [place] [address] [phone]       OFFICE FOLLOW-UP NOTE  Teresa. Bonnetta Allbee Date of Birth:  November 18, 1940 Medical Record Number:  [id number]   HPI: Teresa York is a 77 year pleasant Caucasian lady seen today for first office follow for the following hospital admission for stroke in January 2019. History is obtained from the patient and her family member as well as review of electronic medical records. I personally reviewed imaging films.Teresa.Viktoria Wentzis a 77 y.o.femalewith PMH of atrial fibrillation-Xareltodiscontinued due torecent GI bleed,chronic low back pain, congestive heart failure, hypertension, hypothyroidism with acute onset of left-sided weakness and right gaze preference along with dysarthria and left-sided facial droop.NIH stroke scale 22 on arrival.She was a candidate for endovascular thrombectomy.CT Angiogram of the brain showed right M1 occlusion. After taking written informed consent from the family patient underwent complete revascularization of the occluded right M1 segment with 2 passes of retriever device with the key 3 reperfusion. Patient was admitted to the intensive care unit where blood pressure was tightly controlled and she underwent close neurological monitoring. MRI scan of the brain subsequently showed moderate sized patchy right MCA infarct. Patient was extubated and eventually pass swallow eval and was started on anticoagulation with eliquis. She was transferred to inpatient rehabilitation where she made good recovery.and her NIH stroke scale improved from 22 on admission to 2 at discharge. She is to regain muscle strength in the left side but still has some mild weakness particularly in the left leg. She is able to ambulate with a wheeled walker but has very poor stamina and cannot walk far. She has finished home physical and occupational therapy and plans to start outpatient therapy soon. The patient still needs  help getting in and out of bed. She can go to the restroom as well but needs help with her shower. The patient unfortunately had a fall on 01/09/18 and is back in the year with a scalp hematoma as well as very tiny falx subdural hematoma hence eliquis has been stopped. The patient is reluctant to go back on anticoagulation as she did not do well on Xarelto in the past and had GI hemorrhage.   ROS:   14 system review of systems is positive for  Appetite change, chills, fatigue, hearing loss, ringing in the ears, runny nose, trouble swallowing, blurred vision, shortness of breath, leg swelling, excessive thirst, abdominal pain, swollen abdomen, diarrhea, nausea, vomiting, apnea, snoring, allergies, incontinence of bladder with frequency of urination, back pain, walking difficulty, easy bruising, memory loss, dizziness, speech difficulty, weakness, depression and nervousness and recent fall.  PMH:  Past Medical History:  Diagnosis Date  . Allergic rhinitis   . Anxiety   . Atrial fibrillation ([place])   . Barrett's esophagus   . Breast cancer ([place]) 2005   "left"  . Chronic lower back pain   . Chronic systolic CHF (congestive heart failure) (HCC)    a. EF 30-35% felt to be possibly be tachy mediated from afib wtih RVR  . Depression   . Diverticulosis   . GERD (gastroesophageal reflux disease)   . Hiatal hernia   . Hypertension   . Hypothyroidism   . Left atrial enlargement   . Mitral regurgitation   . OAB (overactive bladder)   . Persistent atrial fibrillation ([place]) 10/31/2016   a. s/p failed DCCV x3. Now on amiodarone.   . 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 Stroke [facility])  Social History:  Social History   Socioeconomic History  . Marital status: Married    Spouse name: Not on file  . Number of children: 2  . Years of education: Not on file  . Highest education level: Not on file  Occupational History  . Occupation: Retired    Fish farm manager: RETIRED    Social Needs  . Financial resource strain: Not on file  . Food insecurity:    Worry: Not on file    Inability: Not on file  . Transportation needs:    Medical: Not on file    Non-medical: Not on file  Tobacco Use  . Smoking status: Never Smoker  . Smokeless tobacco: Never Used  Substance and Sexual Activity  . Alcohol use: Yes    Alcohol/week: 0.0 oz    Comment: 1-2 per week  . Drug use: No  . Sexual activity: Not Currently  Lifestyle  . Physical activity:    Days per week: Not on file    Minutes per session: Not on file  . Stress: Not on file  Relationships  . Social connections:    Talks on phone: Not on file    Gets together: Not on file    Attends religious service: Not on file    Active member of club or organization: Not on file    Attends meetings of clubs or organizations: Not on file    Relationship status: Not on file  . Intimate partner violence:    Fear of current or ex partner: Not on file    Emotionally abused: Not on file    Physically abused: Not on file    Forced sexual activity: Not on file  Other Topics Concern  . Not on file  Social History Narrative  . Not on file    Medications:   Current Outpatient Medications on File Prior to Visit  Medication Sig Dispense Refill  . amiodarone (PACERONE) 200 MG tablet Take 1 tablet (200 mg total) by mouth daily. 90 tablet 3  . atorvastatin (LIPITOR) 20 MG tablet Take 1 tablet (20 mg total) by mouth daily at 6 PM. 30 tablet 1  . cetirizine (ZYRTEC) 10 MG tablet Take 10 mg by mouth daily. Reported on 10/27/2015    . Cholecalciferol (D 1000) 1000 units capsule Take 1,000 Units by mouth daily. Reported on 11/03/2015    . ferrous sulfate (SM IRON) 325 (65 FE) MG tablet Take 325 mg by mouth daily.     Marland Kitchen FLUoxetine (PROZAC) 20 MG capsule Take 40 mg by mouth daily. For 30 days    . fluticasone (FLONASE) 50 MCG/ACT nasal spray 1 spray by Each Nare route daily for 30 days.    . furosemide (LASIX) 20 MG tablet Take 20  mg by mouth daily. For 30 days    . levothyroxine (SYNTHROID, LEVOTHROID) 100 MCG tablet Take 100 mcg by mouth daily. For 30 days    . magnesium oxide (MAG-OX) 400 MG tablet Take 400 mg by mouth 2 (two) times daily. For 30 days    . metoprolol succinate (TOPROL-XL) 25 MG 24 hr tablet Take by mouth.    . Multiple Vitamins-Minerals (ICAPS) CAPS Take 1 capsule by mouth daily.    Marland Kitchen omeprazole (PRILOSEC) 40 MG capsule Take 40 mg by mouth 2 (two) times daily.    . potassium chloride SA (K-DUR,KLOR-CON) 20 MEQ tablet Take 1 tablet (20 mEq total) by mouth 2 (two) times daily. 60 tablet 3   No current facility-administered medications  on file prior to visit.     Allergies:   Allergies  Allergen Reactions  . Benazepril Cough    Cough  . Diltiazem Hcl Rash    Physical Exam General: well developed, well nourished elderly Caucasian lady, seated, in no evident distress Head: head normocephalic and atraumatic.  Neck: supple with no carotid or supraclavicular bruits Cardiovascular: regular rate and rhythm, no murmurs Musculoskeletal: no deformity Skin:  no rash/petichiae,1+ pedal edema bilaterally left greater than right. Vascular:  Normal pulses all extremities Vitals:   01/27/18 1617  BP: (!) 145/86  Pulse: 68   Neurologic Exam Mental Status: Awake and fully alert. Oriented to place and time. Recent and remote memory intact. Attention span, concentration and fund of knowledge appropriate. Mood and affect appropriate.  Cranial Nerves: Fundoscopic exam reveals sharp disc margins. Pupils equal, briskly reactive to light. Extraocular movements full without nystagmus. Visual fields full to confrontation. Hearing intact. Facial sensation intact. Face, tongue, palate moves normally and symmetrically.  Motor: Normal bulk and tone. Normal strength in all tested extremity muscles.except mild left lower extremity drift. Diminished fine finger movements on the left. Left grip weakness. Orbits right over  left upper extremity. Mild weakness of left hip flexors and ankle dorsiflexors. Sensory.: intact to touch ,pinprick .position and vibratory sensation.  Coordination: Rapid alternating movements normal in all extremities. Finger-to-nose and heel-to-shin performed accurately bilaterally. Gait and Station: Arises from chair without difficulty. Stance is slightly broad-based. Uses a wheeled walker. Reflexes: 1+ and symmetric. Toes downgoing.   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 due to atrial fibrillation treated with mechanical thrombectomy with good clinical recovery.patient had a small traumatic falx subdural and scalp hemorrhage secondary to fall a few weeks ago requiring eliquis to be stopped    PLAN: I had a long d/w patient about her recent embolic stroke, atrial fibrillation recent stroke, risk for recurrent stroke/TIAs, personally independently reviewed imaging studies and stroke evaluation results and answered questions.Continue aspirin 81 mg daily  for secondary stroke prevention  Patient does not want to use anticoagulants since she had history of upper GI bleed and scalp hematoma  And falx subduralon Xarelto on eliquis.and maintain strict control of hypertension with blood pressure goal below 130/90, diabetes with hemoglobin A1c goal below 6.5% and lipids with LDL cholesterol goal below 70 mg/dL. I also advised the patient to eat a healthy diet with plenty of whole grains, cereals, fruits and vegetables, exercise regularly and maintain ideal body weight .repeat CT scan of the head in 2 weeks and if scalp hematoma and falx subdural have resolved will resume eliquis at that time. Patient may also want to consider alternatively watchman device if interested. I advised her to discuss this with Dr. Rayann Heman.Followup in the future with my nurse practitioner in 3 months or call earlier if necessary  Greater than 50% of time during this  25 minute visit was spent on counseling,explanation of diagnosis, planning of further management, discussion with patient and family and coordination of care Antony Contras, MD  [facility] [address] [place], [place] [address]  Phone [phone] Fax [phone] Note: This document was prepared with digital dictation and possible smart phrase technology. Any transcriptional errors that result from this process are unintentional

## 2018-01-28 ENCOUNTER — Telehealth: Payer: Self-pay | Admitting: Internal Medicine

## 2018-01-28 ENCOUNTER — Telehealth: Payer: Self-pay | Admitting: Neurology

## 2018-01-28 NOTE — Telephone Encounter (Signed)
[employer] Medicare order sent to GI. They will obtain the auth and will reach out to the pt to schedule.

## 2018-01-28 NOTE — Telephone Encounter (Signed)
Spoke with patient who recently fell and experienced an IC bleed.   Dr. Rayann Heman stopped her Eliquis and deferred to Dr. Leonie Man.   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   She is having a CT scan of the head May 6. After reading Dr. Jackalyn Lombard note, I will inform patient that she must contact Dr Leonie Man for his recommendation.

## 2018-01-28 NOTE — Telephone Encounter (Signed)
Pt's spouse calling:  Pt went to see Dr Leonie Man and he wanted to know why she was taken off of Eliquis and want the pt to start back on it. Pt's spouse want a call back to discuss.

## 2018-01-28 NOTE — Telephone Encounter (Signed)
Called patient back to review with her Dr. Jackalyn Lombard purpose for stopping the Eliquis in his last OV note.  I informed her that the decision now is up to Dr. Leonie Man.   She will contact Dr. Leonie Man and she verbalized an understanding.

## 2018-02-03 ENCOUNTER — Other Ambulatory Visit: Payer: Self-pay

## 2018-02-06 ENCOUNTER — Other Ambulatory Visit: Payer: Self-pay

## 2018-02-10 ENCOUNTER — Other Ambulatory Visit: Payer: Self-pay

## 2018-02-10 NOTE — Patient Outreach (Signed)
Telephone outreach to patient to obtain mRS was successfully completed. mRS = 3 

## 2018-02-12 ENCOUNTER — Telehealth: Payer: Self-pay | Admitting: Neurology

## 2018-02-12 NOTE — Telephone Encounter (Signed)
Rn call husband could not leave vm. The vm was full.

## 2018-02-12 NOTE — Telephone Encounter (Signed)
RN tried to call husband again. Someone pick up phone and did not say anything. Rn said MR.Knoke name twice, phone disconnected. RN will send this to Dr Leonie Man for [facility].

## 2018-02-12 NOTE — Telephone Encounter (Signed)
Pts husband called stating that they had to cancel the pts CT scan for tomorrow due to her taking a fall yesterday and going to the ED. Pt had a CT done there and has a compressed fracture in her back. For any questions please contact [place] at [phone]

## 2018-02-12 NOTE — Telephone Encounter (Signed)
Ok thanks for letting me know

## 2018-02-13 ENCOUNTER — Other Ambulatory Visit: Payer: Medicare HMO

## 2018-02-13 NOTE — Telephone Encounter (Signed)
[employer] Josem Kaufmann: [id number] (exp. 02/12/18 to 05/16/18)

## 2018-02-19 NOTE — Telephone Encounter (Signed)
ERROR

## 2018-02-24 ENCOUNTER — Ambulatory Visit: Payer: Medicare HMO | Admitting: Cardiovascular Disease

## 2018-02-27 ENCOUNTER — Other Ambulatory Visit (HOSPITAL_COMMUNITY): Payer: Self-pay | Admitting: Interventional Radiology

## 2018-02-27 DIAGNOSIS — S22000A Wedge compression fracture of unspecified thoracic vertebra, initial encounter for closed fracture: Secondary | ICD-10-CM

## 2018-03-09 ENCOUNTER — Other Ambulatory Visit: Payer: Self-pay

## 2018-03-09 ENCOUNTER — Emergency Department (HOSPITAL_COMMUNITY): Payer: Medicare HMO

## 2018-03-09 ENCOUNTER — Emergency Department (HOSPITAL_COMMUNITY)
Admission: EM | Admit: 2018-03-09 | Discharge: 2018-03-09 | Disposition: A | Discharge status: Home / Self Care | Payer: Medicare HMO | Attending: Emergency Medicine | Admitting: Emergency Medicine

## 2018-03-09 ENCOUNTER — Encounter (HOSPITAL_COMMUNITY): Payer: Self-pay | Admitting: *Deleted

## 2018-03-09 DIAGNOSIS — Z7982 Long term (current) use of aspirin: Secondary | ICD-10-CM | POA: Insufficient documentation

## 2018-03-09 DIAGNOSIS — I5022 Chronic systolic (congestive) heart failure: Secondary | ICD-10-CM | POA: Insufficient documentation

## 2018-03-09 DIAGNOSIS — F419 Anxiety disorder, unspecified: Secondary | ICD-10-CM | POA: Diagnosis not present

## 2018-03-09 DIAGNOSIS — Z79899 Other long term (current) drug therapy: Secondary | ICD-10-CM | POA: Insufficient documentation

## 2018-03-09 DIAGNOSIS — E039 Hypothyroidism, unspecified: Secondary | ICD-10-CM | POA: Diagnosis not present

## 2018-03-09 DIAGNOSIS — I110 Hypertensive heart disease with heart failure: Secondary | ICD-10-CM | POA: Diagnosis not present

## 2018-03-09 DIAGNOSIS — Y9389 Activity, other specified: Secondary | ICD-10-CM | POA: Insufficient documentation

## 2018-03-09 DIAGNOSIS — F329 Major depressive disorder, single episode, unspecified: Secondary | ICD-10-CM | POA: Insufficient documentation

## 2018-03-09 DIAGNOSIS — Z85828 Personal history of other malignant neoplasm of skin: Secondary | ICD-10-CM | POA: Diagnosis not present

## 2018-03-09 DIAGNOSIS — Y998 Other external cause status: Secondary | ICD-10-CM | POA: Diagnosis not present

## 2018-03-09 DIAGNOSIS — S0003XA Contusion of scalp, initial encounter: Secondary | ICD-10-CM | POA: Insufficient documentation

## 2018-03-09 DIAGNOSIS — Y92129 Unspecified place in nursing home as the place of occurrence of the external cause: Secondary | ICD-10-CM | POA: Diagnosis not present

## 2018-03-09 DIAGNOSIS — Z8673 Personal history of transient ischemic attack (TIA), and cerebral infarction without residual deficits: Secondary | ICD-10-CM | POA: Diagnosis not present

## 2018-03-09 DIAGNOSIS — S0990XA Unspecified injury of head, initial encounter: Secondary | ICD-10-CM | POA: Diagnosis present

## 2018-03-09 DIAGNOSIS — S40012A Contusion of left shoulder, initial encounter: Secondary | ICD-10-CM

## 2018-03-09 DIAGNOSIS — W01198A Fall on same level from slipping, tripping and stumbling with subsequent striking against other object, initial encounter: Secondary | ICD-10-CM | POA: Insufficient documentation

## 2018-03-09 DIAGNOSIS — Z853 Personal history of malignant neoplasm of breast: Secondary | ICD-10-CM | POA: Diagnosis not present

## 2018-03-09 DIAGNOSIS — W19XXXA Unspecified fall, initial encounter: Secondary | ICD-10-CM

## 2018-03-09 MED ORDER — ACETAMINOPHEN 325 MG PO TABS
650.0000 mg | ORAL_TABLET | Freq: Once | ORAL | Status: AC
  Administered 2018-03-09: 650 mg via ORAL
  Filled 2018-03-09: qty 2

## 2018-03-09 NOTE — Discharge Instructions (Addendum)
Make sure you are taking Tylenol 1000mg  every 4-6 hours as needed for pain.  Use your walker and assistance at all times to prevent falls.

## 2018-03-09 NOTE — ED Notes (Signed)
Patient transported to CT 

## 2018-03-09 NOTE — ED Provider Notes (Signed)
[facility] EMERGENCY DEPARTMENT Provider Note   CSN: [id number] Arrival date & time: 03/09/18  1523     History   Chief Complaint Chief Complaint  Patient presents with  . Fall    HPI Teresa York is a 77 y.o. female.  Patient is a 77 year old female with a history of atrial fibrillation, CHF, recent stroke in January of this year who has sustained multiple falls since that time presenting today after a fall where she lives.  Patient was walking with a walker which she is not supposed to do unassisted and she moved out of the way of another resident when she went to get up she lost her balance and fell backwards hitting her head on the wall and her left shoulder.  She is complaining of some soreness in her neck, headache and some shoulder soreness.  She denies any loss of consciousness.  Lower extremities are not painful and she was able to stand after the event.  She denies any chest pain, shortness of breath, abdominal pain.  She currently only takes a baby aspirin is on no other anticoagulation at this time.  Patient reports that since her stroke this is the ninth fall this year.  The history is provided by the patient.    Past Medical History:  Diagnosis Date  . Allergic rhinitis   . Anxiety   . Atrial fibrillation ([place])   . Barrett's esophagus   . Breast cancer ([place]) 2005   "left"  . Chronic lower back pain   . Chronic systolic CHF (congestive heart failure) (HCC)    a. EF 30-35% felt to be possibly be tachy mediated from afib wtih RVR  . Depression   . Diverticulosis   . GERD (gastroesophageal reflux disease)   . Hiatal hernia   . Hypertension   . Hypothyroidism   . Left atrial enlargement   . Mitral regurgitation   . OAB (overactive bladder)   . Persistent atrial fibrillation ([place]) 10/31/2016   a. s/p failed DCCV x3. Now on amiodarone.   . 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 Stroke [facility])     Patient Active Problem  List   Diagnosis Date Noted  . 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 Persistent atrial fibrillation ([place])   . Chronic systolic CHF (congestive heart failure) ([place])   . Hypertension   . Hypothyroidism   . Acute on chronic combined systolic and diastolic CHF (congestive heart failure) ([place]) 11/12/2016  . Aortic valve regurgitation 10/31/2016  . Atrial fibrillation with RVR ([place]) 10/31/2016  . Barrett's esophagus without dysplasia 10/27/2015  . Dysphagia 10/27/2015  . Loss of weight 10/27/2015  . Chronic anticoagulation 10/27/2015    Past Surgical History:  Procedure Laterality Date  . BACK SURGERY    . BREAST BIOPSY Left 2005  . BREAST LUMPECTOMY Left 2005  . CARDIOVERSION N/A 11/02/2016   Procedure: CARDIOVERSION;  Surgeon: Thayer Headings, MD;  Location: [facility];  Service: Cardiovascular;  Laterality: N/A;  . CARDIOVERSION N/A 11/16/2016   Procedure: CARDIOVERSION;  Surgeon: Fay Records, MD;  Location: [facility];  Service: Cardiovascular;  Laterality: N/A;  . CARDIOVERSION N/A 12/24/2016   Procedure: CARDIOVERSION;  Surgeon: Sanda Klein, MD;  Location[facility];  Service: Cardiovascular;  Laterality: N/A;  . CARPAL TUNNEL RELEASE Bilateral 1995  . COLONOSCOPY    . ELBOW FRACTURE SURGERY Right 2009   with implant  .  ESOPHAGOGASTRODUODENOSCOPY    . ESOPHAGUS SURGERY     "in the process of getting cancerous cell off my esophagus" (10/31/2016)  . FRACTURE SURGERY    . IR CT HEAD LTD  11/14/2017  . IR PERCUTANEOUS ART THROMBECTOMY/INFUSION INTRACRANIAL INC DIAG ANGIO  11/14/2017  . LUMBAR DISC SURGERY     L5  . MOHS SURGERY     "corner of right eye; on left cheek"  . RADIOLOGY WITH ANESTHESIA N/A 11/13/2017   Procedure: IR WITH ANESTHESIA;  Surgeon: Radiologist, Medication, MD;  Location: [facility];  Service: Radiology;  Laterality: N/A;  . TEE WITHOUT CARDIOVERSION N/A 11/16/2016   Procedure: TRANSESOPHAGEAL ECHOCARDIOGRAM  (TEE);  Surgeon: Fay Records, MD;  Location: [facility];  Service: Cardiovascular;  Laterality: N/A;  . TONSILLECTOMY    . TUBAL LIGATION  1980     OB History   None      Home Medications    Prior to Admission medications   Medication Sig Start Date End Date Taking? Authorizing Provider  amiodarone (PACERONE) 200 MG tablet Take 1 tablet (200 mg total) by mouth daily. 01/15/18   Thompson Grayer, MD  aspirin EC 81 MG tablet Take 81 mg by mouth daily.    [provider]  atorvastatin (LIPITOR) 20 MG tablet Take 1 tablet (20 mg total) by mouth daily at 6 PM. 11/15/17   Mary Sella, NP  cetirizine (ZYRTEC) 10 MG tablet Take 10 mg by mouth daily. Reported on 10/27/2015    [provider]  Cholecalciferol (D 1000) 1000 units capsule Take 1,000 Units by mouth daily. Reported on 11/03/2015 11/30/14   [provider]  ferrous sulfate (SM IRON) 325 (65 FE) MG tablet Take 325 mg by mouth daily.  11/30/14   [provider]  FLUoxetine (PROZAC) 20 MG capsule Take 40 mg by mouth daily. For 30 days 01/14/18 02/13/18  [provider]  fluticasone (FLONASE) 50 MCG/ACT nasal spray 1 spray by Each Nare route daily for 30 days. 12/27/17   [provider]  furosemide (LASIX) 20 MG tablet Take 20 mg by mouth daily. For 30 days 01/14/18 02/13/18  [provider]  levothyroxine (SYNTHROID, LEVOTHROID) 100 MCG tablet Take 100 mcg by mouth daily. For 30 days 01/15/18 02/14/18  [provider]  levothyroxine (SYNTHROID, LEVOTHROID) 150 MCG tablet Take 150 mcg by mouth daily. 02/06/18   [provider]  metoprolol succinate (TOPROL-XL) 25 MG 24 hr tablet Take by mouth. 12/26/17   [provider]  Multiple Vitamins-Minerals (ICAPS) CAPS Take 1 capsule by mouth daily.    [provider]  omeprazole (PRILOSEC) 40 MG capsule Take 40 mg by mouth 2 (two) times daily.    [provider]  potassium chloride SA (K-DUR,KLOR-CON) 20  MEQ tablet Take 1 tablet (20 mEq total) by mouth 2 (two) times daily. 11/15/17   Mary Sella, NP  traZODone (DESYREL) 150 MG tablet Take 75 mg by mouth at bedtime. 02/06/18   [provider]    Family History Family History  Problem Relation Age of Onset  . Heart attack Father 95  . Colon cancer Neg Hx   . Esophageal cancer Neg Hx   . Stomach cancer Neg Hx     Social History Social History   Tobacco Use  . Smoking status: Never Smoker  . Smokeless tobacco: Never Used  Substance Use Topics  . Alcohol use: Yes    Alcohol/week: 0.0 oz    Comment: 1-2 per week  .  Drug use: No     Allergies   Benazepril and Diltiazem hcl   Review of Systems Review of Systems  All other systems reviewed and are negative.    Physical Exam Updated Vital Signs BP (!) 144/50 (BP Location: Right Arm)   Pulse (!) 54   Temp 97.6 F (36.4 C) (Oral)   Resp 18   Ht 5\' 2"  (1.575 m)   Wt 78 kg (172 lb)   LMP  (LMP Unknown)   SpO2 95%   BMI 31.46 kg/m   Physical Exam  Constitutional: She is oriented to person, place, and time. She appears well-developed and well-nourished. No distress.  HENT:  Head: Normocephalic. Head is with contusion.    Mouth/Throat: Oropharynx is clear and moist.  Eyes: Pupils are equal, round, and reactive to light. Conjunctivae and EOM are normal.  Neck: Normal range of motion. Neck supple. Spinous process tenderness and muscular tenderness present. Normal range of motion present.  Cardiovascular: Normal rate, regular rhythm and intact distal pulses.  No murmur heard. Pulmonary/Chest: Effort normal and breath sounds normal. No respiratory distress. She has no wheezes. She has no rales.  Abdominal: Soft. She exhibits no distension. There is no tenderness. There is no rebound and no guarding.  Musculoskeletal: Normal range of motion. She exhibits tenderness. She exhibits no edema.       Left shoulder: She exhibits tenderness and bony tenderness. She  exhibits normal range of motion, no deformity, no spasm, normal pulse and normal strength.  Neurological: She is alert and oriented to person, place, and time. She has normal strength. She is not disoriented. No cranial nerve deficit or sensory deficit.  Sensation intact  Skin: Skin is warm and dry. No rash noted. No erythema.  Psychiatric: She has a normal mood and affect. Her behavior is normal.  Nursing note and vitals reviewed.    ED Treatments / Results  Labs (all labs ordered are listed, but only abnormal results are displayed) Labs Reviewed - No data to display  EKG None  Radiology Ct Head Wo Contrast  Result Date: 03/09/2018 CLINICAL DATA:  [place] and hit back of head. Prior stroke. EXAM: CT HEAD WITHOUT CONTRAST CT CERVICAL SPINE WITHOUT CONTRAST TECHNIQUE: Multidetector CT imaging of the head and cervical spine was performed following the standard protocol without intravenous contrast. Multiplanar CT image reconstructions of the cervical spine were also generated. COMPARISON:  Head CT 02/11/2018 FINDINGS: CT HEAD FINDINGS Brain: A chronic right MCA territory infarct is again noted involving the temporal lobe, insula, and basal ganglia. Mild cerebral atrophy separate from the areas of encephalomalacia is within normal limits for age. There is no evidence of acute infarct, intracranial hemorrhage, mass, midline shift, or extra-axial fluid collection. Mild chronic small vessel ischemic disease is noted in the cerebral white matter bilaterally. Vascular: Calcified atherosclerosis at the skull base. No hyperdense vessel. Skull: No fracture or suspicious osseous lesion. Sinuses/Orbits: Visualized paranasal sinuses and mastoid air cells are clear. Orbits are unremarkable. Other: Mild posterior parietal scalp swelling. CT CERVICAL SPINE FINDINGS Alignment: Normal. Skull base and vertebrae: No acute fracture or destructive osseous process. 1.7 cm region of dense sclerosis in the left C7 lamina,  possibly a bone island. Soft tissues and spinal canal: No prevertebral fluid or swelling. No visible canal hematoma. Disc levels: Severe disc space narrowing at C5-6 and C6-7 with degenerative spurring resulting in moderate to severe neural foraminal stenosis on the right at C5-6 and bilaterally at C6-7. No osseous neural foraminal stenosis.  Upper chest: Motion artifact and mild atelectasis in the lung apices. Other: Prominent calcified atherosclerosis at both carotid bifurcations. IMPRESSION: 1. No evidence of acute intracranial abnormality. 2. Chronic right MCA territory infarct. 3. Mild posterior parietal scalp swelling. 4. No evidence of acute cervical spine fracture or traumatic subluxation. Electronically Signed   By: Logan Bores M.D.   On: 03/09/2018 19:43   Ct Cervical Spine Wo Contrast  Result Date: 03/09/2018 CLINICAL DATA:  [place] and hit back of head. Prior stroke. EXAM: CT HEAD WITHOUT CONTRAST CT CERVICAL SPINE WITHOUT CONTRAST TECHNIQUE: Multidetector CT imaging of the head and cervical spine was performed following the standard protocol without intravenous contrast. Multiplanar CT image reconstructions of the cervical spine were also generated. COMPARISON:  Head CT 02/11/2018 FINDINGS: CT HEAD FINDINGS Brain: A chronic right MCA territory infarct is again noted involving the temporal lobe, insula, and basal ganglia. Mild cerebral atrophy separate from the areas of encephalomalacia is within normal limits for age. There is no evidence of acute infarct, intracranial hemorrhage, mass, midline shift, or extra-axial fluid collection. Mild chronic small vessel ischemic disease is noted in the cerebral white matter bilaterally. Vascular: Calcified atherosclerosis at the skull base. No hyperdense vessel. Skull: No fracture or suspicious osseous lesion. Sinuses/Orbits: Visualized paranasal sinuses and mastoid air cells are clear. Orbits are unremarkable. Other: Mild posterior parietal scalp swelling. CT  CERVICAL SPINE FINDINGS Alignment: Normal. Skull base and vertebrae: No acute fracture or destructive osseous process. 1.7 cm region of dense sclerosis in the left C7 lamina, possibly a bone island. Soft tissues and spinal canal: No prevertebral fluid or swelling. No visible canal hematoma. Disc levels: Severe disc space narrowing at C5-6 and C6-7 with degenerative spurring resulting in moderate to severe neural foraminal stenosis on the right at C5-6 and bilaterally at C6-7. No osseous neural foraminal stenosis. Upper chest: Motion artifact and mild atelectasis in the lung apices. Other: Prominent calcified atherosclerosis at both carotid bifurcations. IMPRESSION: 1. No evidence of acute intracranial abnormality. 2. Chronic right MCA territory infarct. 3. Mild posterior parietal scalp swelling. 4. No evidence of acute cervical spine fracture or traumatic subluxation. Electronically Signed   By: Logan Bores M.D.   On: 03/09/2018 19:43   Dg Shoulder Left  Result Date: 03/09/2018 CLINICAL DATA:  Fall. Shoulder pain. Initial encounter. EXAM: LEFT SHOULDER - 2+ VIEW COMPARISON:  None. FINDINGS: There is no evidence of acute fracture or dislocation. Moderate acromioclavicular osteoarthrosis is noted with osteophyte formation and joint space narrowing. Calcification along the superior margin of the greater tuberosity may reflect calcific rotator cuff tendinitis. Left axillary surgical clips, aortic atherosclerosis, and thoracic vertebral augmentation are noted. IMPRESSION: 1. No acute osseous abnormality identified. 2. Moderate acromioclavicular osteoarthrosis. 3. Suspected calcific rotator cuff tendinitis. Electronically Signed   By: Logan Bores M.D.   On: 03/09/2018 19:25    Procedures Procedures (including critical care time)  Medications Ordered in ED Medications  acetaminophen (TYLENOL) tablet 650 mg (has no administration in time range)     Initial Impression / Assessment and Plan / ED Course  I  have reviewed the triage vital signs and the nursing notes.  Pertinent labs & imaging results that were available during my care of the patient were reviewed by me and considered in my medical decision making (see chart for details).     Patient presenting today after a mechanical fall.  Patient's mechanical fall is related to deficit she sustained after having a stroke earlier this year.  Patient has  had multiple falls since the stroke.  Patient denied any syncope or presyncopal symptoms before the fall.  She did hit her head on the wall and has having left shoulder pain.  She is otherwise neurovascularly intact at this time.  She is not on anticoagulation other than aspirin.  We will do a CT of the head, C-spine and left shoulder imaging to ensure no acute injury.  8:17 PM Images without acute findings today.  Findings discussed with pt and her son and feel pt is safe for d/c.  Final Clinical Impressions(s) / ED Diagnoses   Final diagnoses:  Fall, initial encounter  Contusion of scalp, initial encounter  Contusion of left shoulder, initial encounter    ED Discharge Orders    None       Blanchie Dessert, MD 03/09/18 2019

## 2018-03-09 NOTE — ED Triage Notes (Signed)
The pt is also c/o some lower back pain and lt shoulder pain where she struck her shoulder when she fell

## 2018-03-09 NOTE — ED Triage Notes (Signed)
The pt was walking with her walker at the assisted living facility at [facility]  She lost her balance and fell striking her head in the back  Last stroke in feb this year.  She has had 9 falls since February  No loc   Blurred vision for awhile

## 2018-03-12 ENCOUNTER — Other Ambulatory Visit: Payer: Medicare HMO

## 2018-03-12 ENCOUNTER — Ambulatory Visit: Payer: Medicare HMO | Admitting: Neurology

## 2018-03-12 ENCOUNTER — Encounter: Payer: Self-pay | Admitting: Neurology

## 2018-03-12 VITALS — BP 88/59 | HR 60 | Wt 171.6 lb

## 2018-03-12 DIAGNOSIS — I480 Paroxysmal atrial fibrillation: Secondary | ICD-10-CM

## 2018-03-12 DIAGNOSIS — W19XXXA Unspecified fall, initial encounter: Secondary | ICD-10-CM

## 2018-03-12 MED ORDER — APIXABAN 2.5 MG PO TABS: 5.0000 mg | ORAL_TABLET | Freq: Two times a day (BID) | ORAL | Status: DC

## 2018-03-12 NOTE — Patient Instructions (Signed)
I had a long d/w patient and her husband  about her recent embolic stroke, atrial fibrillation recent stroke and falls, risk for recurrent stroke/TIAs, personally independently reviewed imaging studies and stroke evaluation results and answered questions I recommend she resume Eliquis for stroke prevention for atrial fibrillation since is been more than 2 months since her small infarcts hematoma and subdural following a fall.  Even though she keeps on having repeated falls the risk of recurrent strokes likely outweighs the bleeding risk in my opinion.  I counseled the patient to use a wheeled walker at all times and we discussed fall and safety prevention precautions.  Maintain strict control of hypertension with blood pressure goal below 130/90, diabetes with hemoglobin A1c goal below 6.5% and lipids with LDL cholesterol goal below 70 mg/dL. I also advised the patient to eat a healthy diet with plenty of whole grains, cereals, fruits and vegetables, exercise regularly and maintain ideal body weight recommend continue ongoing physical and occupational therapy even at home after discharge..Followup in the future with my nurse practitioner in 3 months or call earlier if necessary

## 2018-03-12 NOTE — Progress Notes (Signed)
[facility] [address] [place]. [place] [address] [phone]       OFFICE FOLLOW-UP NOTE  [name] Date of Birth:  1941-02-03 Medical Record Number:  [id number]   HPI: initial visit 01/27/2018 :  [name] is a 27 year pleasant Caucasian lady seen today for first office follow for the following hospital admission for stroke in January 2019. History is obtained from the patient and her family member as well as review of electronic medical records. I personally reviewed imaging films.[name] a 77 y.o.femalewith PMH of atrial fibrillation-Xareltodiscontinued due torecent GI bleed,chronic low back pain, congestive heart failure, hypertension, hypothyroidism with acute onset of left-sided weakness and right gaze preference along with dysarthria and left-sided facial droop.NIH stroke scale 22 on arrival.She was a candidate for endovascular thrombectomy.CT Angiogram of the brain showed right M1 occlusion. After taking written informed consent from the family patient underwent complete revascularization of the occluded right M1 segment with 2 passes of retriever device with the key 3 reperfusion. Patient was admitted to the intensive care unit where blood pressure was tightly controlled and she underwent close neurological monitoring. MRI scan of the brain subsequently showed moderate sized patchy right MCA infarct. Patient was extubated and eventually pass swallow eval and was started on anticoagulation with eliquis. She was transferred to inpatient rehabilitation where she made good recovery.and her NIH stroke scale improved from 22 on admission to 2 at discharge. She is to regain muscle strength in the left side but still has some mild weakness particularly in the left leg. She is able to ambulate with a wheeled walker but has very poor stamina and cannot walk far. She has finished home physical and occupational therapy and plans to start outpatient therapy  soon. The patient still needs help getting in and out of bed. She can go to the restroom as well but needs help with her shower. The patient unfortunately had a fall on 01/09/18 and is back in the year with a scalp hematoma as well as very tiny falx subdural hematoma hence eliquis has been stopped. The patient is reluctant to go back on anticoagulation as she did not do well on Xarelto in the past and had GI hemorrhage.  Update 03/12/2018 : She returns for follow-up after last visit 2 months ago.  She is accompanied by her husband.  The patient had another fall on 03/09/2018 when she was taking something out of the refrigerator she tripped on her walker.  She fell on the back of her head.  CT scan of the head without contrast done personally reviewed the films shows no acute abnormality or hemorrhage.  The previously seen small falx subdural hematoma has completely resolved.  The patient is still living in nursing home but the plan is to discharge her home later this week.  The family seems concerned about this.  She is able to walk with a wheeled walker but requires 1 person assist to transfer and get up.  She is still on aspirin and Eliquis has not yet been resumed.  She has had no recurrent stroke or TIA symptoms.  Her blood pressure is well controlled in fact today it is quite low at 88/59.  She is tolerating Lipitor well without muscle aches and pains.  No new neurological issues. ROS:   14 system review of systems is positive for hearing loss, blurred vision, double vision, swollen abdomen, constipation, diarrhea, nausea, restless leg, insomnia, apnea, frequent waking, daytime sleepiness, incontinence of bladder, frequency  of urination, bladder urgency, back pain, aching muscles, walking difficulty, memory loss, dizziness, numbness, speech difficulty, weakness, agitation, confusion, decreased concentration, depression, nervousness, anxiety and hallucinations and frequent falls.  All other systems  negative.  PMH:  Past Medical History:  Diagnosis Date  . Allergic rhinitis   . Anxiety   . Atrial fibrillation ([place])   . Barrett's esophagus   . Breast cancer ([place]) 2005   "left"  . Chronic lower back pain   . Chronic systolic CHF (congestive heart failure) (HCC)    a. EF 30-35% felt to be possibly be tachy mediated from afib wtih RVR  . Depression   . Diverticulosis   . GERD (gastroesophageal reflux disease)   . Hiatal hernia   . Hypertension   . Hypothyroidism   . Left atrial enlargement   . Mitral regurgitation   . OAB (overactive bladder)   . Persistent atrial fibrillation ([place]) 10/31/2016   a. s/p failed DCCV x3. Now on amiodarone.   . 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 Stroke [facility])     Social History:  Social History   Socioeconomic History  . Marital status: Married    Spouse name: Not on file  . Number of children: 2  . Years of education: Not on file  . Highest education level: Not on file  Occupational History  . Occupation: Retired    Fish farm manager: RETIRED  Social Needs  . Financial resource strain: Not on file  . Food insecurity:    Worry: Not on file    Inability: Not on file  . Transportation needs:    Medical: Not on file    Non-medical: Not on file  Tobacco Use  . Smoking status: Never Smoker  . Smokeless tobacco: Never Used  Substance and Sexual Activity  . Alcohol use: Yes    Alcohol/week: 0.0 oz    Comment: 1-2 per week  . Drug use: No  . Sexual activity: Not Currently  Lifestyle  . Physical activity:    Days per week: Not on file    Minutes per session: Not on file  . Stress: Not on file  Relationships  . Social connections:    Talks on phone: Not on file    Gets together: Not on file    Attends religious service: Not on file    Active member of club or organization: Not on file    Attends meetings of clubs or organizations: Not on file    Relationship status: Not on file  . Intimate partner  violence:    Fear of current or ex partner: Not on file    Emotionally abused: Not on file    Physically abused: Not on file    Forced sexual activity: Not on file  Other Topics Concern  . Not on file  Social History Narrative  . Not on file    Medications:   Current Outpatient Medications on File Prior to Visit  Medication Sig Dispense Refill  . amiodarone (PACERONE) 200 MG tablet Take 1 tablet (200 mg total) by mouth daily. 90 tablet 3  . atorvastatin (LIPITOR) 20 MG tablet Take 1 tablet (20 mg total) by mouth daily at 6 PM. 30 tablet 1  . cetirizine (ZYRTEC) 10 MG tablet Take 10 mg by mouth daily. Reported on 10/27/2015    . Cholecalciferol (D 1000) 1000 units capsule Take 1,000 Units by mouth daily. Reported on 11/03/2015    . ferrous sulfate (SM IRON)  325 (65 FE) MG tablet Take 325 mg by mouth daily.     Marland Kitchen FLUoxetine (PROZAC) 40 MG capsule Take 40 mg by mouth daily.    . fluticasone (FLONASE) 50 MCG/ACT nasal spray 1 spray by Each Nare route daily for 30 days.    Marland Kitchen levothyroxine (SYNTHROID, LEVOTHROID) 125 MCG tablet Take 125 mcg by mouth daily before breakfast.    . metoprolol succinate (TOPROL-XL) 25 MG 24 hr tablet Take 12.5 mg by mouth.     . Multiple Vitamins-Minerals (ICAPS) CAPS Take 1 capsule by mouth daily.    Marland Kitchen omeprazole (PRILOSEC) 40 MG capsule Take 40 mg by mouth 2 (two) times daily.    Marland Kitchen oxyCODONE-acetaminophen (PERCOCET/ROXICET) 5-325 MG tablet Take by mouth every 4 (four) hours as needed for severe pain.    . potassium chloride SA (K-DUR,KLOR-CON) 20 MEQ tablet Take 1 tablet (20 mEq total) by mouth 2 (two) times daily. 60 tablet 3  . traZODone (DESYREL) 150 MG tablet Take 75 mg by mouth at bedtime.  0  . FLUoxetine (PROZAC) 20 MG capsule Take 40 mg by mouth daily. For 30 days    . furosemide (LASIX) 20 MG tablet Take 20 mg by mouth daily. For 30 days     No current facility-administered medications on file prior to visit.     Allergies:   Allergies  Allergen  Reactions  . Benazepril Cough    Cough  . Diltiazem Hcl Rash    Physical Exam General: well developed, well nourished elderly Caucasian lady, seated, in no evident distress Head: head normocephalic and atraumatic.  Neck: supple with no carotid or supraclavicular bruits Cardiovascular: regular rate and rhythm, no murmurs Musculoskeletal: no deformity Skin:  no rash/petichiae,1+ pedal edema bilaterally left greater than right. Vascular:  Normal pulses all extremities Vitals:   03/12/18 1401  BP: (!) 88/59  Pulse: 60   Neurologic Exam Mental Status: Awake and fully alert. Oriented to place and time. Recent and remote memory intact. Attention span, concentration and fund of knowledge appropriate. Mood and affect appropriate.  Cranial Nerves: Fundoscopic exam reveals sharp disc margins. Pupils equal, briskly reactive to light. Extraocular movements full without nystagmus. Visual fields full to confrontation. Hearing intact. Facial sensation intact. Face, tongue, palate moves normally and symmetrically.  Motor: Normal bulk and tone. Normal strength in all tested extremity muscles.except mild left lower extremity drift. Diminished fine finger movements on the left. Left grip weakness. Orbits right over left upper extremity. Mild weakness of left hip flexors and ankle dorsiflexors. Sensory.: intact to touch ,pinprick .position and vibratory sensation.  Coordination: Rapid alternating movements normal in all extremities. Finger-to-nose and heel-to-shin performed accurately bilaterally. Gait and Station: Arises from chair without difficulty. Stance is slightly broad-based. Uses a wheeled walker. Reflexes: 1+ and symmetric. Toes downgoing.   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 due to atrial fibrillation treated with mechanical thrombectomy with good clinical recovery.Patient had a small traumatic falx subdural and scalp hemorrhage  secondary to fall  In April 2019   requiring eliquis to be stopped but repeat Ct head 03/09/09 shows resolution Hearing loss, double vision   PLAN: I had a long d/w patient and her husband  about her recent embolic stroke, atrial fibrillation recent stroke and falls, risk for recurrent stroke/TIAs, personally independently reviewed imaging studies and stroke evaluation results and answered questions I recommend she resume Eliquis for stroke prevention for atrial fibrillation since is  been more than 2 months since her small infarcts hematoma and subdural following a fall.  Even though she keeps on having repeated falls the risk of recurrent strokes likely outweighs the bleeding risk in my opinion.  I counseled the patient to use a wheeled walker at all times and we discussed fall and safety prevention precautions.  Maintain strict control of hypertension with blood pressure goal below 130/90, diabetes with hemoglobin A1c goal below 6.5% and lipids with LDL cholesterol goal below 70 mg/dL. I also advised the patient to eat a healthy diet with plenty of whole grains, cereals, fruits and vegetables, exercise regularly and maintain ideal body weight recommend continue ongoing physical and occupational therapy even at home after discharge..Followup in the future with my nurse practitioner in 3 months or call earlier if necessary  Greater than 50% of time during this 25 minute visit was spent on counseling,explanation of diagnosis, planning of further management, discussion with patient and family and coordination of care Antony Contras, MD  [facility] [address] [place], [place] [address]  Phone [phone] Fax [phone] Note: This document was prepared with digital dictation and possible smart phrase technology. Any transcriptional errors that result from this process are unintentional

## 2018-03-22 ENCOUNTER — Other Ambulatory Visit: Payer: Self-pay | Admitting: Cardiology

## 2018-03-22 MED ORDER — AMIODARONE HCL 200 MG PO TABS: 200.0000 mg | ORAL_TABLET | Freq: Every day | ORAL | 1 refills | Status: DC

## 2018-04-16 ENCOUNTER — Ambulatory Visit: Payer: Medicare HMO | Admitting: Internal Medicine

## 2018-04-16 ENCOUNTER — Encounter: Payer: Self-pay | Admitting: Internal Medicine

## 2018-04-16 VITALS — BP 118/62 | HR 55 | Ht 62.0 in | Wt 163.0 lb

## 2018-04-16 DIAGNOSIS — G4733 Obstructive sleep apnea (adult) (pediatric): Secondary | ICD-10-CM

## 2018-04-16 DIAGNOSIS — I4819 Other persistent atrial fibrillation: Secondary | ICD-10-CM

## 2018-04-16 DIAGNOSIS — I5189 Other ill-defined heart diseases: Secondary | ICD-10-CM

## 2018-04-16 DIAGNOSIS — T462X5A Adverse effect of other antidysrhythmic drugs, initial encounter: Secondary | ICD-10-CM

## 2018-04-16 DIAGNOSIS — I629 Nontraumatic intracranial hemorrhage, unspecified: Secondary | ICD-10-CM | POA: Diagnosis not present

## 2018-04-16 DIAGNOSIS — I481 Persistent atrial fibrillation: Secondary | ICD-10-CM

## 2018-04-16 DIAGNOSIS — J984 Other disorders of lung: Secondary | ICD-10-CM

## 2018-04-16 DIAGNOSIS — I351 Nonrheumatic aortic (valve) insufficiency: Secondary | ICD-10-CM

## 2018-04-16 MED ORDER — METOPROLOL SUCCINATE ER 25 MG PO TB24: 12.5000 mg | ORAL_TABLET | Freq: Every day | ORAL | 3 refills | Status: DC

## 2018-04-16 MED ORDER — APIXABAN 5 MG PO TABS: 5.0000 mg | ORAL_TABLET | Freq: Two times a day (BID) | ORAL | 3 refills | Status: DC

## 2018-04-16 NOTE — Progress Notes (Signed)
PCP: Maylon Peppers, MD Primary Cardiologist: Dr [name] Primary EP: Dr Francia Greaves Teresa York is a 77 y.o. female who presents today for routine electrophysiology followup.  Since last being seen in our clinic, the patient reports doing reasonably well.  Her legs are weak.  She has had 2 more falls.  She has SOB with minimal activity.  Today, she denies symptoms of palpitations, chest pain,  lower extremity edema, dizziness, presyncope, or syncope.  The patient is otherwise without complaint today.   Past Medical History:  Diagnosis Date  . Allergic rhinitis   . Anxiety   . Atrial fibrillation ([place])   . Barrett's esophagus   . Breast cancer ([place]) 2005   "left"  . Chronic lower back pain   . Chronic systolic CHF (congestive heart failure) (HCC)    a. EF 30-35% felt to be possibly be tachy mediated from afib wtih RVR  . Depression   . Diverticulosis   . GERD (gastroesophageal reflux disease)   . Hiatal hernia   . Hypertension   . Hypothyroidism   . Left atrial enlargement   . Mitral regurgitation   . OAB (overactive bladder)   . Persistent atrial fibrillation ([place]) 10/31/2016   a. s/p failed DCCV x3. Now on amiodarone.   . 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 Stroke [facility])    Past Surgical History:  Procedure Laterality Date  . BACK SURGERY    . BREAST BIOPSY Left 2005  . BREAST LUMPECTOMY Left 2005  . CARDIOVERSION N/A 11/02/2016   Procedure: CARDIOVERSION;  Surgeon: Thayer Headings, MD;  Location: [place];  Service: Cardiovascular;  Laterality: N/A;  . CARDIOVERSION N/A 11/16/2016   Procedure: CARDIOVERSION;  Surgeon: Fay Records, MD;  Location: [place];  Service: Cardiovascular;  Laterality: N/A;  . CARDIOVERSION N/A 12/24/2016   Procedure: CARDIOVERSION;  Surgeon: Sanda Klein, MD;  Location: [facility];  Service: Cardiovascular;  Laterality: N/A;  . CARPAL TUNNEL RELEASE Bilateral 1995  . COLONOSCOPY    . ELBOW FRACTURE SURGERY  Right 2009   with implant  . ESOPHAGOGASTRODUODENOSCOPY    . ESOPHAGUS SURGERY     "in the process of getting cancerous cell off my esophagus" (10/31/2016)  . FRACTURE SURGERY    . IR CT HEAD LTD  11/14/2017  . IR PERCUTANEOUS ART THROMBECTOMY/INFUSION INTRACRANIAL INC DIAG ANGIO  11/14/2017  . LUMBAR DISC SURGERY     L5  . MOHS SURGERY     "corner of right eye; on left cheek"  . RADIOLOGY WITH ANESTHESIA N/A 11/13/2017   Procedure: IR WITH ANESTHESIA;  Surgeon: Radiologist, Medication, MD;  Location: [place];  Service: Radiology;  Laterality: N/A;  . TEE WITHOUT CARDIOVERSION N/A 11/16/2016   Procedure: TRANSESOPHAGEAL ECHOCARDIOGRAM (TEE);  Surgeon: Fay Records, MD;  Location: [place];  Service: Cardiovascular;  Laterality: N/A;  . TONSILLECTOMY    . TUBAL LIGATION  1980    ROS- all systems are reviewed and negatives except as per HPI above  Current Outpatient Medications  Medication Sig Dispense Refill  . acetaminophen (TYLENOL) 325 MG tablet Take 650 mg by mouth 2 (two) times daily as needed.    Marland Kitchen amiodarone (PACERONE) 100 MG tablet Take 100 mg by mouth daily.    Marland Kitchen apixaban (ELIQUIS) 5 MG TABS tablet Take 5 mg by mouth 2 (two) times daily.    Marland Kitchen atorvastatin (LIPITOR) 20 MG tablet Take 1 tablet (20 mg total) by mouth daily  at 6 PM. 30 tablet 1  . cetirizine (ZYRTEC) 10 MG tablet Take 10 mg by mouth daily. Reported on 10/27/2015    . Cholecalciferol (D 1000) 1000 units capsule Take 1,000 Units by mouth daily. Reported on 11/03/2015    . FLUoxetine (PROZAC) 40 MG capsule Take 40 mg by mouth daily.    . fluticasone (FLONASE) 50 MCG/ACT nasal spray 1 spray by Each Nare route daily for 30 days.    Marland Kitchen levothyroxine (SYNTHROID, LEVOTHROID) 125 MCG tablet Take 125 mcg by mouth daily before breakfast.    . Magnesium Oxide (MAGOX 400 PO) Take 400 mg by mouth as needed.    . metoprolol succinate (TOPROL-XL) 25 MG 24 hr tablet Take 12.5 mg by mouth.     . Multiple Vitamins-Minerals (ICAPS) CAPS  Take 1 capsule by mouth daily.    Marland Kitchen omeprazole (PRILOSEC) 40 MG capsule Take 40 mg by mouth 2 (two) times daily.    Marland Kitchen oxyCODONE-acetaminophen (PERCOCET/ROXICET) 5-325 MG tablet Take by mouth every 4 (four) hours as needed for severe pain.    . potassium chloride SA (K-DUR,KLOR-CON) 20 MEQ tablet Take 1 tablet (20 mEq total) by mouth 2 (two) times daily. 60 tablet 3  . traZODone (DESYREL) 150 MG tablet Take 75 mg by mouth at bedtime.  0   Current Facility-Administered Medications  Medication Dose Route Frequency Provider Last Rate Last Dose  . apixaban (ELIQUIS) tablet 5 mg  5 mg Oral BID Garvin Fila, MD        Physical Exam: Vitals:   04/16/18 1044  BP: 118/62  Pulse: (!) 55  Weight: 163 lb (73.9 kg)  Height: 5\' 2"  (1.575 m)    GEN- The patient is elderly and chronically ill appearing, alert and oriented x 3 today.   Head- normocephalic, atraumatic Eyes-  Sclera clear, conjunctiva pink Ears- hearing intact Oropharynx- clear Lungs- dry rales at bases, normal work of breathing Heart- Regular rate and rhythm, no murmurs, rubs or gallops, PMI not laterally displaced GI- soft, NT, ND, + BS Extremities- no clubbing, cyanosis, or edema  Wt Readings from Last 3 Encounters:  04/16/18 163 lb (73.9 kg)  03/12/18 171 lb 9.6 oz (77.8 kg)  03/09/18 172 lb (78 kg)    EKG tracing ordered today is personally reviewed and shows sinus rhythm 55 bpm, PR 206 msec, QRS 96 msec, Qtc 489 msec  Assessment and Plan:  1. Persistent afib Currently in sinus rhythm with amiodarone 100mg  daily Will need labs on return She has chest imaging ordered.  Will review for fibrotic changes.  Her husband and spouse feel that SOB proceeds amiodarone. Dr Leonie Man has restarted eliquis She is doing well with this currently  2. OSA Compliant with CPAP  3. ICH Defer to Dr Leonie Man She has had ongoing issues with falls and is likely poor candidate for anticoagualtion  4. Moderate MR, diastolic  dysfunction Stable No change required today  Chronically ill A conservative approach is advised  Follow-up with Dr [name] as scheduled I will see in 3 months  Thompson Grayer MD, [facility] 04/16/2018 10:53 AM

## 2018-04-16 NOTE — Patient Instructions (Addendum)
Medication Instructions:  Your physician recommends that you continue on your current medications as directed. Please refer to the Current Medication list given to you today.  Labwork: None ordered.  Testing/Procedures Please get a chest xray  Follow-Up: Your physician wants you to follow-up in: 3 months with Dr. Rayann Heman.     July 17, 2018 at 10:00 AM.   Any Other Special Instructions Will Be Listed Below (If Applicable).  If you need a refill on your cardiac medications before your next appointment, please call your pharmacy.

## 2018-04-24 ENCOUNTER — Other Ambulatory Visit: Payer: Self-pay | Admitting: Internal Medicine

## 2018-04-24 MED ORDER — AMIODARONE HCL 100 MG PO TABS: 100.0000 mg | ORAL_TABLET | Freq: Every day | ORAL | 3 refills | Status: DC

## 2018-04-28 ENCOUNTER — Telehealth: Payer: Self-pay | Admitting: Adult Health

## 2018-04-28 ENCOUNTER — Ambulatory Visit: Payer: Medicare HMO | Admitting: Adult Health

## 2018-04-28 NOTE — Telephone Encounter (Signed)
Made in error

## 2018-05-09 ENCOUNTER — Encounter

## 2018-05-09 ENCOUNTER — Ambulatory Visit: Payer: Medicare HMO | Admitting: Cardiovascular Disease

## 2018-06-12 ENCOUNTER — Encounter: Payer: Self-pay | Admitting: Adult Health

## 2018-06-12 ENCOUNTER — Ambulatory Visit: Payer: Medicare HMO | Admitting: Adult Health

## 2018-06-12 ENCOUNTER — Telehealth: Payer: Self-pay

## 2018-06-12 VITALS — BP 106/54 | HR 62 | Ht 62.0 in | Wt 160.0 lb

## 2018-06-12 DIAGNOSIS — Z8679 Personal history of other diseases of the circulatory system: Secondary | ICD-10-CM

## 2018-06-12 DIAGNOSIS — I480 Paroxysmal atrial fibrillation: Secondary | ICD-10-CM

## 2018-06-12 DIAGNOSIS — Z8673 Personal history of transient ischemic attack (TIA), and cerebral infarction without residual deficits: Secondary | ICD-10-CM

## 2018-06-12 DIAGNOSIS — E785 Hyperlipidemia, unspecified: Secondary | ICD-10-CM

## 2018-06-12 DIAGNOSIS — I10 Essential (primary) hypertension: Secondary | ICD-10-CM

## 2018-06-12 NOTE — Progress Notes (Signed)
[facility] [address] [place]. [place] [address] [phone]       OFFICE FOLLOW-UP NOTE  [name] Date of Birth:  1941/06/02 Medical Record Number:  [id number]   HPI: initial visit 01/27/2018 :  [name] is a 43 year pleasant Caucasian lady seen today for follow up of stroke in January 2019. History is obtained from the patient and her family member as well as review of electronic medical records. I personally reviewed imaging films. Ms.Teresa Wentzis a 77 y.o.femalewith PMH of atrial fibrillation-Xareltodiscontinued due torecent GI bleed,chronic low back pain, congestive heart failure, hypertension, hypothyroidism with acute onset of left-sided weakness and right gaze preference along with dysarthria and left-sided facial droop.NIH stroke scale 22 on arrival.She was a candidate for endovascular thrombectomy.CT Angiogram of the brain showed right M1 occlusion. After taking written informed consent from the family patient underwent complete revascularization of the occluded right M1 segment with 2 passes of retriever device with the key 3 reperfusion. Patient was admitted to the intensive care unit where blood pressure was tightly controlled and she underwent close neurological monitoring. MRI scan of the brain subsequently showed moderate sized patchy right MCA infarct. Patient was extubated and eventually pass swallow eval and was started on anticoagulation with eliquis. She was transferred to inpatient rehabilitation where she made good recovery.and her NIH stroke scale improved from 22 on admission to 2 at discharge. She is to regain muscle strength in the left side but still has some mild weakness particularly in the left leg. She is able to ambulate with a wheeled walker but has very poor stamina and cannot walk far. She has finished home physical and occupational therapy and plans to start outpatient therapy soon. The patient still needs help getting in and  out of bed. She can go to the restroom as well but needs help with her shower. The patient unfortunately had a fall on 01/09/18 and was back in the ER with a scalp hematoma as well as very tiny falx subdural hematoma hence eliquis has been stopped. The patient is reluctant to go back on anticoagulation as she did not do well on Xarelto in the past and had GI hemorrhage.  Update 03/12/2018 PS : She returns for follow-up after last visit 2 months ago.  She is accompanied by her husband.  The patient had another fall on 03/09/2018 when she was taking something out of the refrigerator she tripped on her walker.  She fell on the back of her head.  CT scan of the head without contrast done personally reviewed the films shows no acute abnormality or hemorrhage.  The previously seen small falx subdural hematoma has completely resolved.  The patient is still living in nursing home but the plan is to discharge her home later this week.  The family seems concerned about this.  She is able to walk with a wheeled walker but requires 1 person assist to transfer and get up.  She is still on aspirin and Eliquis has not yet been resumed.  She has had no recurrent stroke or TIA symptoms.  Her blood pressure is well controlled in fact today it is quite low at 88/59.  She is tolerating Lipitor well without muscle aches and pains.  No new neurological issues.  Interval History 06/12/18: Patient is being seen today for scheduled follow-up appointment and is accompanied by her husband.  She feels as though her recovery has been slow and has not made as much progress as she  feels like she should have.  She continues to do home PT/OT but does not do additional exercises on own outside of sessions.  Husband states that she sits in her chair most of the day and does very little exercise except for PT/OT sessions.  Patient is unable to state exactly why she does not do additional exercises.  She continues to take Eliquis with mild bruising but no  bleeding.  Continues to take Lipitor without side effects myalgias.  Pressure today satisfactory for patient at 106/54.  Patient does complain of dizziness sensation that can occur without position changes.  She was seen by PCP who felt as though this was most likely orthostatic in nature and was counseled to increase hydration and ensure fall prevention along with use of meclizine as needed.  Her husband and patient, this dizziness is associated with blurry vision and decreased hearing.  Patient does not feel as though the dizzinss is related to position change and even when she is sitting in the chair at appointment, she states she has a dizziness sensation.  Denies any recent falls.  Denies new or worsening stroke/TIA symptoms.    ROS:   14 system review of systems is positive for dizziness, and weakness and all others negative  PMH:  Past Medical History:  Diagnosis Date  . Allergic rhinitis   . Anxiety   . Atrial fibrillation ([place])   . Barrett's esophagus   . Breast cancer ([place]) 2005   "left"  . Chronic lower back pain   . Chronic systolic CHF (congestive heart failure) ([facility])    a. EF 30-35% felt to be possibly be tachy mediated from afib wtih RVR  . Depression   . Diverticulosis   . GERD (gastroesophageal reflux disease)   . Hiatal hernia   . Hypertension   . Hypothyroidism   . Left atrial enlargement   . Mitral regurgitation   . OAB (overactive bladder)   . Persistent atrial fibrillation ([place]) 10/31/2016   a. s/p failed DCCV x3. Now on amiodarone.   . 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 Stroke [facility])     Social History:  Social History   Socioeconomic History  . Marital status: Married    Spouse name: Not on file  . Number of children: 2  . Years of education: Not on file  . Highest education level: Not on file  Occupational History  . Occupation: Retired    Fish farm manager: RETIRED  Social Needs  . Financial resource strain: Not on file  .  Food insecurity:    Worry: Not on file    Inability: Not on file  . Transportation needs:    Medical: Not on file    Non-medical: Not on file  Tobacco Use  . Smoking status: Never Smoker  . Smokeless tobacco: Never Used  Substance and Sexual Activity  . Alcohol use: Yes    Alcohol/week: 0.0 standard drinks    Comment: 1-2 per week  . Drug use: No  . Sexual activity: Not Currently  Lifestyle  . Physical activity:    Days per week: Not on file    Minutes per session: Not on file  . Stress: Not on file  Relationships  . Social connections:    Talks on phone: Not on file    Gets together: Not on file    Attends religious service: Not on file    Active member of club or organization: Not on file  Attends meetings of clubs or organizations: Not on file    Relationship status: Not on file  . Intimate partner violence:    Fear of current or ex partner: Not on file    Emotionally abused: Not on file    Physically abused: Not on file    Forced sexual activity: Not on file  Other Topics Concern  . Not on file  Social History Narrative  . Not on file    Medications:   Current Outpatient Medications on File Prior to Visit  Medication Sig Dispense Refill  . acetaminophen (TYLENOL) 325 MG tablet Take 650 mg by mouth 2 (two) times daily as needed.    Marland Kitchen amiodarone (PACERONE) 100 MG tablet Take 1 tablet (100 mg total) by mouth daily. 90 tablet 3  . apixaban (ELIQUIS) 5 MG TABS tablet Take 1 tablet (5 mg total) by mouth 2 (two) times daily. 180 tablet 3  . atorvastatin (LIPITOR) 20 MG tablet Take 1 tablet (20 mg total) by mouth daily at 6 PM. 30 tablet 1  . cetirizine (ZYRTEC) 10 MG tablet Take 10 mg by mouth daily. Reported on 10/27/2015    . Cholecalciferol (D 1000) 1000 units capsule Take 1,000 Units by mouth daily. Reported on 11/03/2015    . FLUoxetine (PROZAC) 40 MG capsule Take 40 mg by mouth daily.    . fluticasone (FLONASE) 50 MCG/ACT nasal spray 1 spray by Each Nare route  daily for 30 days.    Marland Kitchen levothyroxine (SYNTHROID, LEVOTHROID) 125 MCG tablet Take 125 mcg by mouth daily before breakfast.    . Magnesium Oxide (MAGOX 400 PO) Take 400 mg by mouth as needed.    . meclizine (ANTIVERT) 25 MG tablet Take by mouth.    . metoprolol succinate (TOPROL-XL) 25 MG 24 hr tablet Take 0.5 tablets (12.5 mg total) by mouth daily. 90 tablet 3  . mirtazapine (REMERON) 15 MG tablet     . Multiple Vitamins-Minerals (ICAPS) CAPS Take 1 capsule by mouth daily.    Marland Kitchen omeprazole (PRILOSEC) 40 MG capsule Take 40 mg by mouth 2 (two) times daily.    . ondansetron (ZOFRAN) 4 MG tablet TAKE 1 TABLET BY MOUTH EVERY 6 TO 8 HOURS AS NEEDED  1  . oxyCODONE-acetaminophen (PERCOCET/ROXICET) 5-325 MG tablet Take by mouth every 4 (four) hours as needed for severe pain.    . potassium chloride SA (K-DUR,KLOR-CON) 20 MEQ tablet Take 1 tablet (20 mEq total) by mouth 2 (two) times daily. 60 tablet 3  . traZODone (DESYREL) 150 MG tablet Take 75 mg by mouth at bedtime.  0   No current facility-administered medications on file prior to visit.     Allergies:   Allergies  Allergen Reactions  . Benazepril Cough    Cough  . Diltiazem Hcl Rash    Physical Exam General: well developed, well nourished elderly Caucasian lady, seated, in no evident distress Head: head normocephalic and atraumatic.  Neck: supple with no carotid or supraclavicular bruits Cardiovascular: regular rate and rhythm, no murmurs Musculoskeletal: no deformity Skin:  no rash/petichiae,1+ pedal edema bilaterally left greater than right. Vascular:  Normal pulses all extremities Vitals:   06/12/18 1449  BP: (!) 106/54  Pulse: 62   Neurologic Exam Mental Status: Awake and fully alert. Oriented to place and time. Recent and remote memory intact. Attention span, concentration and fund of knowledge appropriate. Mood and affect appropriate.  Cranial Nerves: Fundoscopic exam reveals sharp disc margins. Pupils equal, briskly reactive  to light. Extraocular  movements full without nystagmus. Visual fields full to confrontation. Hearing intact. Facial sensation intact. Face, tongue, palate moves normally and symmetrically.  Motor: Normal bulk and tone. Normal strength in all tested extremity muscles except for mild bilateral hip flexor weakness along with bilateral ankle dorsiflexion weakness (could be due to deconditioning) Sensory.: intact to touch ,pinprick .position and vibratory sensation.  Coordination: Rapid alternating movements normal in all extremities. Finger-to-nose and heel-to-shin performed accurately bilaterally.  Orbits right arm over left arm Gait and Station: Patient using wheelchair and is unable to ambulate without assistance of rolling walker Reflexes: 1+ and symmetric. Toes downgoing.    ASSESSMENT: 24 year lady with embolic right MCA infarct in February 2019 due to atrial fibrillation treated with mechanical thrombectomy with good clinical recovery.Patient had a small traumatic falx subdural and scalp hemorrhage secondary to fall  In April 2019   requiring eliquis to be stopped but repeat Ct head 03/09/09 shows resolution Hearing loss, double vision   PLAN: -Continue Eliquis (apixaban) daily  and atorvastatin 20mg   for secondary stroke prevention -F/u with PCP regarding your HLD and HTN management -f/u with cardiologist for AF on [facility] management -continue to monitor BP at home -Continue home PT/OT and highly encouraged additional exercise on her own due to possible deconditioning -patient is in agreement at this time -recommend to ambulate more frequently but to ensure the assistance of husband due to fall prevention  -encouraged to ensure increase of fluid intake -possibly consider referral to ENT for evaluation of dizziness with  hearing loss -Maintain strict control of hypertension with blood pressure goal below 130/90, diabetes with hemoglobin A1c goal below 6.5% and cholesterol with LDL cholesterol (bad  cholesterol) goal below 70 mg/dL. I also advised the patient to eat a healthy diet with plenty of whole grains, cereals, fruits and vegetables, exercise regularly and maintain ideal body weight.  Follow up in 6 months or call earlier if needed  Greater than 50% of time during this 25 minute visit was spent on counseling,explanation of diagnosis, planning of further management, discussion with patient and family and coordination of care  Venancio Poisson, [facility]  [facility] [address] [place], [place] [address]  Phone [phone] Fax [phone] Note: This document was prepared with digital dictation and possible smart phrase technology. Any transcriptional errors that result from this process are unintentional.

## 2018-06-12 NOTE — Patient Instructions (Addendum)
Continue Eliquis (apixaban) daily  and lipitor  for secondary stroke prevention  Continue to follow up with PCP regarding cholesterol and blood pressure management   Continue to follow up with your cardiologist Dr. Rayann Heman as scheduled for continued atrial fibrillation management  Continue with home PT/OT - I believe you could benefit from additional physical therapy due to continued bilateral hip weakness which may be due to deconditioning - it is imperative that you do home exercises on your own outside of therapy sessions to ensure that you do not become weaker   Continue to monitor blood pressure at home  Maintain strict control of hypertension with blood pressure goal below 130/90, diabetes with hemoglobin A1c goal below 6.5% and cholesterol with LDL cholesterol (bad cholesterol) goal below 70 mg/dL. I also advised the patient to eat a healthy diet with plenty of whole grains, cereals, fruits and vegetables, exercise regularly and maintain ideal body weight.  Followup in the future with me in 6 months or call earlier if needed       Thank you for coming to see [place] at [facility]. I hope we have been able to provide you high quality care today.  You may receive a patient satisfaction survey over the next few weeks. We would appreciate your feedback and comments so that we may continue to improve ourselves and the health of our patients.

## 2018-06-13 MED ORDER — AMIODARONE HCL 200 MG PO TABS: 100.0000 mg | ORAL_TABLET | Freq: Every day | ORAL | 11 refills | Status: DC

## 2018-06-13 NOTE — Telephone Encounter (Signed)
° °  Patient's spouse calling to follow up, he is wanting clarification on the dosage

## 2018-06-13 NOTE — Progress Notes (Signed)
I agree with the above plan 

## 2018-06-13 NOTE — Telephone Encounter (Signed)
Returned call to Pt.  Advised this nurse would send amiodarone 200 mg tabs to pharmacy-Pt to take 1/2 tablet daily.  Pt indicates understanding.  Will fill script.

## 2018-06-13 NOTE — Telephone Encounter (Signed)
Spoke with patient's husband, the dose Dr. Rayann Heman had on his office note was Amiodarone 100 mg daily. A prescription was sent in and the patient is having difficulty getting it filled. Will call pharmacy to discuss when open.

## 2018-07-07 ENCOUNTER — Telehealth: Payer: Self-pay

## 2018-07-07 NOTE — Telephone Encounter (Signed)
   [facility] Pre-operative Risk Assessment    Request for surgical clearance:  1. What type of surgery is being performed? EGD (3 mo f/u BE w/ LGD for retreatment)  2. When is this surgery scheduled? Unknown   3. What type of clearance is required (medical clearance vs. Pharmacy clearance to hold med vs. Both)? Pharmacy  4. Are there any medications that need to be held prior to surgery and how long? eliquis 2 to 4 days depend on renal function   5. Practice name and name of physician performing surgery?  [facility]   6. What is your office phone number [phone] Corliss Marcus   7.   What is your office fax number  [phone] "attention triage  nurse"  8.   Anesthesia type (None, local, MAC, general) ? Unknown   Michaelyn Barter 07/07/2018, 8:56 AM  _________________________________________________________________   (provider comments below)

## 2018-07-08 NOTE — Telephone Encounter (Signed)
Pt takes Eliquis for afib with CHADS2VASc score of 7 (age x2, sex, CHF, HTN, stroke in January 2019). Previously had GI bleed on Xarelto. Pt had falls in April, May, and June. Eliquis was stopped after April fall due to small subdural hematoma. Eliquis resumed in June per neuro as the risk of recurrent stroke was felt to outweigh the bleeding risk. CrCl is 74mL/min, pt on Eliquis 5mg  BID due to age < 56 and weight > 60kg. Ok to hold Eliquis for 2 days prior to EGD even with stroke history due to reduced CrCl.

## 2018-07-17 ENCOUNTER — Ambulatory Visit: Payer: Medicare HMO | Admitting: Internal Medicine

## 2018-08-03 ENCOUNTER — Emergency Department (HOSPITAL_COMMUNITY): Payer: Medicare HMO

## 2018-08-03 ENCOUNTER — Emergency Department (HOSPITAL_COMMUNITY)
Admission: EM | Admit: 2018-08-03 | Discharge: 2018-08-03 | Disposition: A | Discharge status: Home / Self Care | Payer: Medicare HMO | Attending: Emergency Medicine | Admitting: Emergency Medicine

## 2018-08-03 ENCOUNTER — Encounter (HOSPITAL_COMMUNITY): Payer: Self-pay | Admitting: Emergency Medicine

## 2018-08-03 ENCOUNTER — Other Ambulatory Visit: Payer: Self-pay

## 2018-08-03 DIAGNOSIS — R51 Headache: Secondary | ICD-10-CM | POA: Insufficient documentation

## 2018-08-03 DIAGNOSIS — R0789 Other chest pain: Secondary | ICD-10-CM | POA: Diagnosis not present

## 2018-08-03 DIAGNOSIS — I5022 Chronic systolic (congestive) heart failure: Secondary | ICD-10-CM | POA: Insufficient documentation

## 2018-08-03 DIAGNOSIS — I110 Hypertensive heart disease with heart failure: Secondary | ICD-10-CM | POA: Diagnosis not present

## 2018-08-03 DIAGNOSIS — Z79899 Other long term (current) drug therapy: Secondary | ICD-10-CM | POA: Diagnosis not present

## 2018-08-03 DIAGNOSIS — R531 Weakness: Secondary | ICD-10-CM | POA: Diagnosis not present

## 2018-08-03 DIAGNOSIS — Z7901 Long term (current) use of anticoagulants: Secondary | ICD-10-CM | POA: Diagnosis not present

## 2018-08-03 DIAGNOSIS — Z853 Personal history of malignant neoplasm of breast: Secondary | ICD-10-CM | POA: Diagnosis not present

## 2018-08-03 DIAGNOSIS — Y929 Unspecified place or not applicable: Secondary | ICD-10-CM | POA: Insufficient documentation

## 2018-08-03 DIAGNOSIS — M549 Dorsalgia, unspecified: Secondary | ICD-10-CM | POA: Insufficient documentation

## 2018-08-03 DIAGNOSIS — E039 Hypothyroidism, unspecified: Secondary | ICD-10-CM | POA: Insufficient documentation

## 2018-08-03 DIAGNOSIS — M79605 Pain in left leg: Secondary | ICD-10-CM | POA: Insufficient documentation

## 2018-08-03 DIAGNOSIS — Y999 Unspecified external cause status: Secondary | ICD-10-CM | POA: Diagnosis not present

## 2018-08-03 DIAGNOSIS — R296 Repeated falls: Secondary | ICD-10-CM | POA: Diagnosis not present

## 2018-08-03 DIAGNOSIS — W19XXXA Unspecified fall, initial encounter: Secondary | ICD-10-CM | POA: Diagnosis not present

## 2018-08-03 DIAGNOSIS — M25552 Pain in left hip: Secondary | ICD-10-CM | POA: Insufficient documentation

## 2018-08-03 DIAGNOSIS — Y9301 Activity, walking, marching and hiking: Secondary | ICD-10-CM | POA: Insufficient documentation

## 2018-08-03 DIAGNOSIS — Z85828 Personal history of other malignant neoplasm of skin: Secondary | ICD-10-CM | POA: Insufficient documentation

## 2018-08-03 DIAGNOSIS — R102 Pelvic and perineal pain: Secondary | ICD-10-CM | POA: Insufficient documentation

## 2018-08-03 MED ORDER — HYDROCODONE-ACETAMINOPHEN 5-325 MG PO TABS
2.0000 | ORAL_TABLET | Freq: Once | ORAL | Status: AC
  Administered 2018-08-03: 2 via ORAL
  Filled 2018-08-03: qty 2

## 2018-08-03 NOTE — ED Provider Notes (Signed)
[place] EMERGENCY DEPARTMENT Provider Note   CSN: [id number] Arrival date & time: 08/03/18  0913     History   Chief Complaint Chief Complaint  Patient presents with  . Fall    left side pain  . Headache  . Hip Pain  . Knee Pain    HPI Teresa York is a 77 y.o. female.  Patient complains of pain at left posterior chest from scapula to hip since she fell while walking with her walker 3 days ago.  She suffered another fall 10 days ago while walking with her walker.  She developed a headache yesterday afternoon which was severe yesterday but mild today.  Denies knee pain denies neck pain denies shortness of breath.  She treated herself with Norco 1 tablet last night approximately midnight without relief.  She  and husband report that she had a stroke February 2019, she did not suffer a stroke last week.  She walks with walker.  Suffers from chronic left-sided weakness as result of prior stroke no other associated symptoms.  Walks with walker   HPI  Past Medical History:  Diagnosis Date  . Allergic rhinitis   . Anxiety   . Atrial fibrillation ([place])   . Barrett's esophagus   . Breast cancer ([place]) 2005   "left"  . Chronic lower back pain   . Chronic systolic CHF (congestive heart failure) (HCC)    a. EF 30-35% felt to be possibly be tachy mediated from afib wtih RVR  . Depression   . Diverticulosis   . GERD (gastroesophageal reflux disease)   . Hiatal hernia   . Hypertension   . Hypothyroidism   . Left atrial enlargement   . Mitral regurgitation   . OAB (overactive bladder)   . Persistent atrial fibrillation 10/31/2016   a. s/p failed DCCV x3. Now on amiodarone.   . 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 Stroke [facility])     Patient Active Problem List   Diagnosis Date Noted  . 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 Persistent atrial fibrillation   . Chronic systolic CHF  (congestive heart failure) ([place])   . Hypertension   . Hypothyroidism   . Acute on chronic combined systolic and diastolic CHF (congestive heart failure) ([place]) 11/12/2016  . Aortic valve regurgitation 10/31/2016  . Atrial fibrillation with RVR ([place]) 10/31/2016  . Barrett's esophagus without dysplasia 10/27/2015  . Dysphagia 10/27/2015  . Loss of weight 10/27/2015  . Chronic anticoagulation 10/27/2015    Past Surgical History:  Procedure Laterality Date  . BACK SURGERY    . BREAST BIOPSY Left 2005  . BREAST LUMPECTOMY Left 2005  . CARDIOVERSION N/A 11/02/2016   Procedure: CARDIOVERSION;  Surgeon: Thayer Headings, MD;  Location: [place];  Service: Cardiovascular;  Laterality: N/A;  . CARDIOVERSION N/A 11/16/2016   Procedure: CARDIOVERSION;  Surgeon: Fay Records, MD;  Location: [place];  Service: Cardiovascular;  Laterality: N/A;  . CARDIOVERSION N/A 12/24/2016   Procedure: CARDIOVERSION;  Surgeon: Sanda Klein, MD;  [facility];  Service: Cardiovascular;  Laterality: N/A;  . CARPAL TUNNEL RELEASE Bilateral 1995  . COLONOSCOPY    . ELBOW FRACTURE SURGERY Right 2009   with implant  . ESOPHAGOGASTRODUODENOSCOPY    . ESOPHAGUS SURGERY     "in the process of getting cancerous cell off my esophagus" (10/31/2016)  . FRACTURE SURGERY    . IR CT HEAD  LTD  11/14/2017  . IR PERCUTANEOUS ART THROMBECTOMY/INFUSION INTRACRANIAL INC DIAG ANGIO  11/14/2017  . LUMBAR DISC SURGERY     L5  . MOHS SURGERY     "corner of right eye; on left cheek"  . RADIOLOGY WITH ANESTHESIA N/A 11/13/2017   Procedure: IR WITH ANESTHESIA;  Surgeon: Radiologist, Medication, MD;  Location: [place];  Service: Radiology;  Laterality: N/A;  . TEE WITHOUT CARDIOVERSION N/A 11/16/2016   Procedure: TRANSESOPHAGEAL ECHOCARDIOGRAM (TEE);  Surgeon: Fay Records, MD;  Location: [place];  Service: Cardiovascular;  Laterality: N/A;  . TONSILLECTOMY    . TUBAL LIGATION  1980     OB History   None      Home  Medications    Prior to Admission medications   Medication Sig Start Date End Date Taking? Authorizing Provider  omeprazole (PRILOSEC) 40 MG capsule Take 40 mg by mouth 2 (two) times daily.   Yes [provider]  acetaminophen (TYLENOL) 325 MG tablet Take 650 mg by mouth 2 (two) times daily as needed for mild pain.     [provider]  amiodarone (PACERONE) 200 MG tablet Take 0.5 tablets (100 mg total) by mouth daily. 06/13/18   Allred, Jeneen Rinks, MD  apixaban (ELIQUIS) 5 MG TABS tablet Take 1 tablet (5 mg total) by mouth 2 (two) times daily. 04/16/18   Allred, Jeneen Rinks, MD  atorvastatin (LIPITOR) 20 MG tablet Take 1 tablet (20 mg total) by mouth daily at 6 PM. 11/15/17   Mary Sella, NP  cetirizine (ZYRTEC) 10 MG tablet Take 10 mg by mouth daily. Reported on 10/27/2015    [provider]  Cholecalciferol (D 1000) 1000 units capsule Take 1,000 Units by mouth daily. Reported on 11/03/2015 11/30/14   [provider]  FLUoxetine (PROZAC) 40 MG capsule Take 40 mg by mouth daily.    [provider]  fluticasone (FLONASE) 50 MCG/ACT nasal spray Place 1 spray into both nostrils daily as needed for allergies.  12/27/17   [provider]  levothyroxine (SYNTHROID, LEVOTHROID) 125 MCG tablet Take 125 mcg by mouth daily before breakfast.    [provider]  Magnesium Oxide (MAGOX 400 PO) Take 400 mg by mouth as needed.    [provider]  meclizine (ANTIVERT) 25 MG tablet Take 25 mg by mouth 3 (three) times daily as needed for dizziness or nausea.  06/02/18   [provider]  metoprolol succinate (TOPROL-XL) 25 MG 24 hr tablet Take 0.5 tablets (12.5 mg total) by mouth daily. 04/16/18   Allred, Jeneen Rinks, MD  mirtazapine (REMERON) 15 MG tablet Take 15 mg by mouth at bedtime.  06/10/18   [provider]  Multiple Vitamins-Minerals (ICAPS) CAPS Take 1 capsule by mouth daily.    [provider]  ondansetron (ZOFRAN) 4 MG tablet Take  4 mg by mouth every 8 (eight) hours as needed for nausea or vomiting.  06/02/18   [provider]  oxyCODONE-acetaminophen (PERCOCET/ROXICET) 5-325 MG tablet Take 1 tablet by mouth every 4 (four) hours as needed for severe pain.     [provider]  potassium chloride SA (K-DUR,KLOR-CON) 20 MEQ tablet Take 1 tablet (20 mEq total) by mouth 2 (two) times daily. 11/15/17   Mary Sella, NP  traZODone (DESYREL) 150 MG tablet Take 75 mg by mouth at bedtime. 02/06/18   [provider]    Family History Family History  Problem Relation Age of Onset  . Heart attack Father 95  .  Colon cancer Neg Hx   . Esophageal cancer Neg Hx   . Stomach cancer Neg Hx     Social History Social History   Tobacco Use  . Smoking status: Never Smoker  . Smokeless tobacco: Never Used  Substance Use Topics  . Alcohol use: Yes    Alcohol/week: 0.0 standard drinks    Comment: 1-2 per week  . Drug use: No     Allergies   Benazepril and Diltiazem hcl   Review of Systems Review of Systems  Musculoskeletal: Positive for arthralgias, back pain and gait problem.  Neurological: Positive for weakness and headaches.       Chronic  left-sided weakness     Physical Exam Updated Vital Signs BP (!) 124/58 (BP Location: Left Arm)   Pulse 70   Temp 98.4 F (36.9 C) (Oral)   Resp 17   Ht 5\' 2"  (1.575 m)   Wt 68 kg   LMP  (LMP Unknown)   SpO2 96%   BMI 27.44 kg/m   Physical Exam  Constitutional: She is oriented to person, place, and time.  frailappearing alert Glasgow Coma Score 15 no distress  HENT:  Head: Normocephalic and atraumatic.  Eyes: Pupils are equal, round, and reactive to light. Conjunctivae are normal.  Neck: Neck supple. No tracheal deviation present. No thyromegaly present.  Cardiovascular: Normal rate and regular rhythm.  No murmur heard. Pulmonary/Chest: Effort normal and breath sounds normal.  Chest mildly tender at left side posteriorly no ecchymosis no  crepitance or flail  Abdominal: Soft. Bowel sounds are normal. She exhibits no distension. There is no tenderness.  Musculoskeletal: Normal range of motion. She exhibits no edema or tenderness.  Entire spine is nontender.  Pelvis somewhat tender on anterior to posterior compression.  No crepitance.  No deformity.  4 extremities without contusion abrasion or tenderness neurovascularly intact  Neurological: She is alert and oriented to person, place, and time. Coordination normal.  Nerves II through XII grossly intact.  Motor strength right upper extremity 5/5 left upper extremity 5/5 right lower extremity 5/5, left lower extremity 4/5  Skin: Skin is warm and dry. Capillary refill takes less than 2 seconds. No rash noted.  Psychiatric: She has a normal mood and affect.  Nursing note and vitals reviewed.    ED Treatments / Results  Labs (all labs ordered are listed, but only abnormal results are displayed) Labs Reviewed - No data to display  EKG None  Radiology No results found.  Procedures Procedures (including critical care time)  Medications Ordered in ED Medications - No data to display Results for orders placed or performed during the hospital encounter of 11/13/17  MRSA PCR Screening  Result Value Ref Range   MRSA by PCR NEGATIVE NEGATIVE  Protime-INR  Result Value Ref Range   Prothrombin Time 13.5 11.4 - 15.2 seconds   INR 1.04   APTT  Result Value Ref Range   aPTT 31 24 - 36 seconds  CBC  Result Value Ref Range   WBC 8.0 4.0 - 10.5 K/uL   RBC 3.66 (L) 3.87 - 5.11 MIL/uL   Hemoglobin 10.3 (L) 12.0 - 15.0 g/dL   HCT 33.7 (L) 36.0 - 46.0 %   MCV 92.1 78.0 - 100.0 fL   MCH 28.1 26.0 - 34.0 pg   MCHC 30.6 30.0 - 36.0 g/dL   RDW 17.8 (H) 11.5 - 15.5 %   Platelets 310 150 - 400 K/uL  Differential  Result Value Ref Range  Neutrophils Relative % 62 %   Neutro Abs 5.1 1.7 - 7.7 K/uL   Lymphocytes Relative 24 %   Lymphs Abs 1.9 0.7 - 4.0 K/uL   Monocytes Relative 10  %   Monocytes Absolute 0.8 0.1 - 1.0 K/uL   Eosinophils Relative 3 %   Eosinophils Absolute 0.2 0.0 - 0.7 K/uL   Basophils Relative 1 %   Basophils Absolute 0.1 0.0 - 0.1 K/uL  Comprehensive metabolic panel  Result Value Ref Range   Sodium 142 135 - 145 mmol/L   Potassium 2.8 (L) 3.5 - 5.1 mmol/L   Chloride 106 101 - 111 mmol/L   CO2 24 22 - 32 mmol/L   Glucose, Bld 102 (H) 65 - 99 mg/dL   BUN 8 6 - 20 mg/dL   Creatinine, Ser 1.32 (H) 0.44 - 1.00 mg/dL   Calcium 8.9 8.9 - 10.3 mg/dL   Total Protein 6.4 (L) 6.5 - 8.1 g/dL   Albumin 2.8 (L) 3.5 - 5.0 g/dL   AST 39 15 - 41 U/L   ALT 29 14 - 54 U/L   Alkaline Phosphatase 73 38 - 126 U/L   Total Bilirubin 0.8 0.3 - 1.2 mg/dL   GFR calc non Af Amer 38 (L) >60 mL/min   GFR calc Af Amer 44 (L) >60 mL/min   Anion gap 12 5 - 15  Hemoglobin A1c  Result Value Ref Range   Hgb A1c MFr Bld 4.8 4.8 - 5.6 %   Mean Plasma Glucose 91.06 mg/dL  Lipid panel  Result Value Ref Range   Cholesterol 152 0 - 200 mg/dL   Triglycerides 38 <150 mg/dL   HDL 53 >40 mg/dL   Total CHOL/HDL Ratio 2.9 RATIO   VLDL 8 0 - 40 mg/dL   LDL Cholesterol 91 0 - 99 mg/dL  CBC with Differential/Platelet  Result Value Ref Range   WBC 6.6 4.0 - 10.5 K/uL   RBC 3.10 (L) 3.87 - 5.11 MIL/uL   Hemoglobin 9.0 (L) 12.0 - 15.0 g/dL   HCT 28.4 (L) 36.0 - 46.0 %   MCV 91.6 78.0 - 100.0 fL   MCH 29.0 26.0 - 34.0 pg   MCHC 31.7 30.0 - 36.0 g/dL   RDW 17.7 (H) 11.5 - 15.5 %   Platelets 280 150 - 400 K/uL   Neutrophils Relative % 78 %   Neutro Abs 5.1 1.7 - 7.7 K/uL   Lymphocytes Relative 12 %   Lymphs Abs 0.8 0.7 - 4.0 K/uL   Monocytes Relative 9 %   Monocytes Absolute 0.6 0.1 - 1.0 K/uL   Eosinophils Relative 1 %   Eosinophils Absolute 0.1 0.0 - 0.7 K/uL   Basophils Relative 0 %   Basophils Absolute 0.0 0.0 - 0.1 K/uL  Basic metabolic panel  Result Value Ref Range   Sodium 141 135 - 145 mmol/L   Potassium 2.6 (LL) 3.5 - 5.1 mmol/L   Chloride 105 101 - 111 mmol/L     CO2 23 22 - 32 mmol/L   Glucose, Bld 100 (H) 65 - 99 mg/dL   BUN 8 6 - 20 mg/dL   Creatinine, Ser 1.23 (H) 0.44 - 1.00 mg/dL   Calcium 8.0 (L) 8.9 - 10.3 mg/dL   GFR calc non Af Amer 41 (L) >60 mL/min   GFR calc Af Amer 47 (L) >60 mL/min   Anion gap 13 5 - 15  Magnesium  Result Value Ref Range   Magnesium 1.5 (L) 1.7 - 2.4 mg/dL  Potassium  Result Value Ref Range   Potassium 3.0 (L) 3.5 - 5.1 mmol/L  CBC  Result Value Ref Range   WBC 6.9 4.0 - 10.5 K/uL   RBC 3.10 (L) 3.87 - 5.11 MIL/uL   Hemoglobin 8.9 (L) 12.0 - 15.0 g/dL   HCT 28.7 (L) 36.0 - 46.0 %   MCV 92.6 78.0 - 100.0 fL   MCH 28.7 26.0 - 34.0 pg   MCHC 31.0 30.0 - 36.0 g/dL   RDW 18.2 (H) 11.5 - 15.5 %   Platelets 278 150 - 400 K/uL  Basic metabolic panel  Result Value Ref Range   Sodium 139 135 - 145 mmol/L   Potassium 3.2 (L) 3.5 - 5.1 mmol/L   Chloride 105 101 - 111 mmol/L   CO2 21 (L) 22 - 32 mmol/L   Glucose, Bld 83 65 - 99 mg/dL   BUN 9 6 - 20 mg/dL   Creatinine, Ser 1.50 (H) 0.44 - 1.00 mg/dL   Calcium 8.4 (L) 8.9 - 10.3 mg/dL   GFR calc non Af Amer 32 (L) >60 mL/min   GFR calc Af Amer 37 (L) >60 mL/min   Anion gap 13 5 - 15  Magnesium  Result Value Ref Range   Magnesium 1.9 1.7 - 2.4 mg/dL  Phosphorus  Result Value Ref Range   Phosphorus 3.2 2.5 - 4.6 mg/dL  CBG monitoring, ED  Result Value Ref Range   Glucose-Capillary 97 65 - 99 mg/dL  I-stat troponin, ED  Result Value Ref Range   Troponin i, poc 0.01 0.00 - 0.08 ng/mL   Comment 3          I-Stat Chem 8, ED  Result Value Ref Range   Sodium 143 135 - 145 mmol/L   Potassium 2.8 (L) 3.5 - 5.1 mmol/L   Chloride 103 101 - 111 mmol/L   BUN 9 6 - 20 mg/dL   Creatinine, Ser 1.30 (H) 0.44 - 1.00 mg/dL   Glucose, Bld 99 65 - 99 mg/dL   Calcium, Ion 1.09 (L) 1.15 - 1.40 mmol/L   TCO2 27 22 - 32 mmol/L   Hemoglobin 10.9 (L) 12.0 - 15.0 g/dL   HCT 32.0 (L) 36.0 - 46.0 %  ECHOCARDIOGRAM COMPLETE  Result Value Ref Range   Weight 3,336.88 oz    Height 62 in   BP 153/66 mmHg   LV PW d 12.7 (A) 0.6 - 1.1 mm   FS 36 28 - 44 %   LA vol 92.4 mL   Ao-asc 27 cm   LA ID, A-P, ES 48 mm   IVS/LV PW RATIO, ED .9    Reg peak vel 309 cm/s   PV Reg vel dias 193 cm/s   PV Reg grad dias 15 mmHg   RV sys press 53 mmHg   LA diam index 2.3 cm/m2   LA vol A4C 98.7 ml   P 1/2 time 405 ms   Area-P 1/2 4.58 cm2   E decel time 165 msec   LVOT diameter 18 mm   LVOT area 2.54 cm2   Peak grad 4 mmHg   MV pk E vel 104 m/s   TR max vel 309 cm/s   P 1/2 time 48 ms   MV pk A vel 50.4 m/s   LA vol index 44.4 mL/m2   MV Dec 165    LA diam end sys 48.00 mm   TAPSE 17.00 mm   Dg Ribs Unilateral W/chest Left  Result Date:  08/03/2018 CLINICAL DATA:  Multiple falls, left lower rib pain EXAM: LEFT RIBS AND CHEST - 3+ VIEW COMPARISON:  Chest radiographs dated 04/16/2018 FINDINGS: Lungs are clear.  No pleural effusion or pneumothorax. Mild eventration of the right hemidiaphragm. The heart is top-normal in size. Vertebral augmentation of two midthoracic vertebral bodies. No displaced left rib fracture is seen. Surgical clips along the left lateral chest wall/axilla. IMPRESSION: No evidence of acute cardiopulmonary disease. No displaced left rib fracture is seen. Electronically Signed   By: Julian Hy M.D.   On: 08/03/2018 11:28   Ct Head Wo Contrast  Result Date: 08/03/2018 CLINICAL DATA:  Headache. Stroke last week. EXAM: CT HEAD WITHOUT CONTRAST TECHNIQUE: Contiguous axial images were obtained from the base of the skull through the vertex without intravenous contrast. COMPARISON:  03/09/2018. FINDINGS: Brain: Stable moderately enlarged ventricles and subarachnoid spaces. Stable mild patchy white matter low density in both cerebral hemispheres. Stable old right temporal lobe and basal ganglia middle cerebral artery distribution infarcts. No intracranial hemorrhage, mass lesion or CT evidence of acute infarction. Vascular: No hyperdense vessel or  unexpected calcification. Skull: Bilateral hyperostosis frontalis. Sinuses/Orbits: Unremarkable. Other: None. IMPRESSION: 1. No acute abnormality. 2. Stable atrophy, chronic small vessel white matter ischemic changes and old right middle cerebral artery distribution infarcts. Electronically Signed   By: Claudie Revering M.D.   On: 08/03/2018 12:16   Ct Pelvis Wo Contrast  Result Date: 08/03/2018 CLINICAL DATA:  Pt. Stated, I had a stroke last week. I fell last week and now my left side is so sore and painful, and today I have a headache. My left side hurts from my left hip to my knee EXAM: CT PELVIS WITHOUT CONTRAST TECHNIQUE: Multidetector CT imaging of the pelvis was performed following the standard protocol without intravenous contrast. COMPARISON:  None. FINDINGS: Musculoskeletal: No fracture or bone lesion. Mild narrowing of the superior hip joints with minor spurring from the bases of the femoral heads. No other degenerative change. No convincing hip joint effusion. SI joints and symphysis pubis are normally spaced and aligned. There are degenerative changes of the visualized lower lumbar spine. Bones are demineralized. Surrounding muscular and overlying soft tissues unremarkable. Urinary Tract:  No abnormality visualized. Bowel:  No bowel dilation, wall thickening or inflammation. Vascular/Lymphatic: Atherosclerotic changes along the inferior abdominal aorta and iliac vessels. No aneurysm. Reproductive: Densely calcified 15 mm uterine fibroid. Otherwise unremarkable. Other:  Trace pelvic free fluid. IMPRESSION: 1. No fracture or acute finding. Electronically Signed   By: Lajean Manes M.D.   On: 08/03/2018 12:18    Initial Impression / Assessment and Plan / ED Course  I have reviewed the triage vital signs and the nursing notes.  Pertinent labs & imaging results that were available during my care of the patient were reviewed by me and considered in my medical decision making (see chart for  details).     1:25 PM pain improved after treatment with Norco.  She is alert able to walk with walker without difficulty.  Walks with slight limp favoring left leg.  Her husband states that is her baseline gait.  I have ordered face-to-face encounter for home health needs as outpatient. Plan patient has a code in which she can take at home and follow-up with PMD blood pressure recheck 1 week Final Clinical Impressions(s) / ED Diagnoses  dx #1 fall #2 contusions multiple sites Final diagnoses:  None  #3 elevated blood pressure  ED Discharge Orders    None  Orlie Dakin, MD 08/03/18 [phone]

## 2018-08-03 NOTE — ED Triage Notes (Signed)
Pt. Stated, I had a stroke last week. I fell last week and now my left side is so sore and painful, and today I have a headache. My left side hurts from my left hip to my knee

## 2018-08-03 NOTE — Discharge Instructions (Addendum)
You can take your hydrocodone as directed for pain.  Use your walker at all times to prevent falls.  Call Dr. Michaell Cowing office tomorrow to schedule appointment for within 1 week.  Your blood pressure should be rechecked in the office.  Today's was elevated at 177/67.

## 2018-08-03 NOTE — ED Notes (Signed)
Patient transported to X-ray 

## 2018-08-04 ENCOUNTER — Telehealth: Payer: Self-pay | Admitting: *Deleted

## 2018-08-04 NOTE — Telephone Encounter (Signed)
Hagop Mccollam J. Clydene Laming, RN, BSN, [employer] [phone] Spoke with pt  Via telephone regarding discharge planning for [employer]. Offered pt list of home health agencies to choose from.  Pt chose [facility] to render services. Malachy Mood of [facility] notified. Patient made aware that [facility] will be in contact in 24-48 hours.  No DME needs identified at this time.

## 2018-08-08 HISTORY — PX: CHOLECYSTECTOMY: SHX55

## 2018-09-01 ENCOUNTER — Ambulatory Visit: Payer: Medicare HMO | Admitting: Internal Medicine

## 2018-09-23 ENCOUNTER — Telehealth: Payer: Self-pay

## 2018-09-23 NOTE — Telephone Encounter (Signed)
Left detailed message per DPR.  Call placed to husband.  Wife had missed recent f/u d/t in hospital.  Advised husband if Pt was discharged to call and schedule f/u with MT.  Left MT # for call back.  Entered 2 mo recall to not "lose Pt"  No further needs at this time.

## 2018-10-10 DIAGNOSIS — H353213 Exudative age-related macular degeneration, right eye, with inactive scar: Secondary | ICD-10-CM | POA: Diagnosis not present

## 2018-10-10 DIAGNOSIS — H35433 Paving stone degeneration of retina, bilateral: Secondary | ICD-10-CM | POA: Diagnosis not present

## 2018-10-10 DIAGNOSIS — H43813 Vitreous degeneration, bilateral: Secondary | ICD-10-CM | POA: Diagnosis not present

## 2018-10-10 DIAGNOSIS — H353123 Nonexudative age-related macular degeneration, left eye, advanced atrophic without subfoveal involvement: Secondary | ICD-10-CM | POA: Diagnosis not present

## 2018-10-22 DIAGNOSIS — R630 Anorexia: Secondary | ICD-10-CM | POA: Diagnosis not present

## 2018-10-22 DIAGNOSIS — N183 Chronic kidney disease, stage 3 (moderate): Secondary | ICD-10-CM | POA: Diagnosis not present

## 2018-10-22 DIAGNOSIS — I5032 Chronic diastolic (congestive) heart failure: Secondary | ICD-10-CM | POA: Diagnosis not present

## 2018-10-22 DIAGNOSIS — R296 Repeated falls: Secondary | ICD-10-CM | POA: Diagnosis not present

## 2018-10-22 DIAGNOSIS — E039 Hypothyroidism, unspecified: Secondary | ICD-10-CM | POA: Diagnosis not present

## 2018-10-22 DIAGNOSIS — R195 Other fecal abnormalities: Secondary | ICD-10-CM | POA: Diagnosis not present

## 2018-10-22 DIAGNOSIS — Z7409 Other reduced mobility: Secondary | ICD-10-CM | POA: Diagnosis not present

## 2018-10-22 DIAGNOSIS — F339 Major depressive disorder, recurrent, unspecified: Secondary | ICD-10-CM | POA: Diagnosis not present

## 2018-10-22 DIAGNOSIS — J690 Pneumonitis due to inhalation of food and vomit: Secondary | ICD-10-CM | POA: Diagnosis not present

## 2018-10-22 DIAGNOSIS — I5022 Chronic systolic (congestive) heart failure: Secondary | ICD-10-CM | POA: Diagnosis not present

## 2018-10-30 DIAGNOSIS — H524 Presbyopia: Secondary | ICD-10-CM | POA: Diagnosis not present

## 2018-10-30 DIAGNOSIS — H43813 Vitreous degeneration, bilateral: Secondary | ICD-10-CM | POA: Diagnosis not present

## 2018-10-30 DIAGNOSIS — Z83511 Family history of glaucoma: Secondary | ICD-10-CM | POA: Diagnosis not present

## 2018-10-30 DIAGNOSIS — H2513 Age-related nuclear cataract, bilateral: Secondary | ICD-10-CM | POA: Diagnosis not present

## 2018-10-30 DIAGNOSIS — H353122 Nonexudative age-related macular degeneration, left eye, intermediate dry stage: Secondary | ICD-10-CM | POA: Diagnosis not present

## 2018-10-30 DIAGNOSIS — H3554 Dystrophies primarily involving the retinal pigment epithelium: Secondary | ICD-10-CM | POA: Diagnosis not present

## 2018-10-30 DIAGNOSIS — H353212 Exudative age-related macular degeneration, right eye, with inactive choroidal neovascularization: Secondary | ICD-10-CM | POA: Diagnosis not present

## 2018-10-30 DIAGNOSIS — H52203 Unspecified astigmatism, bilateral: Secondary | ICD-10-CM | POA: Diagnosis not present

## 2018-10-30 DIAGNOSIS — H16223 Keratoconjunctivitis sicca, not specified as Sjogren's, bilateral: Secondary | ICD-10-CM | POA: Diagnosis not present

## 2018-10-30 DIAGNOSIS — H401213 Low-tension glaucoma, right eye, severe stage: Secondary | ICD-10-CM | POA: Diagnosis not present

## 2018-10-30 DIAGNOSIS — H04123 Dry eye syndrome of bilateral lacrimal glands: Secondary | ICD-10-CM | POA: Diagnosis not present

## 2018-10-30 DIAGNOSIS — H401222 Low-tension glaucoma, left eye, moderate stage: Secondary | ICD-10-CM | POA: Diagnosis not present

## 2018-11-10 ENCOUNTER — Other Ambulatory Visit: Payer: Self-pay

## 2018-11-10 NOTE — Patient Outreach (Signed)
[facility]) Care Management  11/10/2018  Teresa York [birth year] [id number]  Nurse call line referral Referral date: 11/10/18 Referral reason: Prescription refill / medication request Insurance: [employer]  Telephone call to patient regarding nurse call line referral. HIPAA verified with patient. Explained reason for call.  Patient states her husband is working on getting the list of her medications now. Patient states she [place] her [place].  Patient states her husband is working on getting her medications but she is not sure if he has been able to obtain them.  RNCM offered to call patients husband to inquire of medication concerns. Patient advised RNCm to contact her husband on his cell phone,.  RNCm attempted call to patients spouse. Unable to reach or leave message due to mailbox not being set up.   PLAN: RNCm will attempt 2nd telephone call to patient's spouse within 4 business days.   Quinn Plowman RN,BSN,CCM [facility] Telephonic  [phone]

## 2018-11-13 ENCOUNTER — Other Ambulatory Visit: Payer: Self-pay

## 2018-11-13 DIAGNOSIS — H401213 Low-tension glaucoma, right eye, severe stage: Secondary | ICD-10-CM | POA: Diagnosis not present

## 2018-11-13 DIAGNOSIS — H401221 Low-tension glaucoma, left eye, mild stage: Secondary | ICD-10-CM | POA: Diagnosis not present

## 2018-11-13 DIAGNOSIS — H3530 Unspecified macular degeneration: Secondary | ICD-10-CM | POA: Diagnosis not present

## 2018-11-13 NOTE — Patient Outreach (Signed)
[facility]) Care Management  11/13/2018  Kimila Papaleo Mar 31, 1941 [phone] Nurse call line referral Referral date: 11/10/18 Referral reason: Prescription refill / medication request Insurance: [employer]  Telephone call to patient regarding referral. Unable to reach patient. HIPAA compliant voice message left with call back phone number.   PLAN: RNCM will attempt 2nd telephone call to patient within 4 business days. RNCM will send outreach letter.   Quinn Plowman RN,BSN, [place] Telephonic  [phone]

## 2018-11-14 DIAGNOSIS — R296 Repeated falls: Secondary | ICD-10-CM | POA: Diagnosis not present

## 2018-11-14 DIAGNOSIS — Z7409 Other reduced mobility: Secondary | ICD-10-CM | POA: Diagnosis not present

## 2018-11-14 DIAGNOSIS — N183 Chronic kidney disease, stage 3 (moderate): Secondary | ICD-10-CM | POA: Diagnosis not present

## 2018-11-14 DIAGNOSIS — F329 Major depressive disorder, single episode, unspecified: Secondary | ICD-10-CM | POA: Diagnosis not present

## 2018-11-14 DIAGNOSIS — K219 Gastro-esophageal reflux disease without esophagitis: Secondary | ICD-10-CM | POA: Diagnosis not present

## 2018-11-14 DIAGNOSIS — I5022 Chronic systolic (congestive) heart failure: Secondary | ICD-10-CM | POA: Diagnosis not present

## 2018-11-15 DIAGNOSIS — R296 Repeated falls: Secondary | ICD-10-CM | POA: Diagnosis not present

## 2018-11-15 DIAGNOSIS — I5022 Chronic systolic (congestive) heart failure: Secondary | ICD-10-CM | POA: Diagnosis not present

## 2018-11-15 DIAGNOSIS — Z7409 Other reduced mobility: Secondary | ICD-10-CM | POA: Diagnosis not present

## 2018-11-15 DIAGNOSIS — N183 Chronic kidney disease, stage 3 (moderate): Secondary | ICD-10-CM | POA: Diagnosis not present

## 2018-11-15 DIAGNOSIS — K219 Gastro-esophageal reflux disease without esophagitis: Secondary | ICD-10-CM | POA: Diagnosis not present

## 2018-11-15 DIAGNOSIS — F329 Major depressive disorder, single episode, unspecified: Secondary | ICD-10-CM | POA: Diagnosis not present

## 2018-11-17 ENCOUNTER — Ambulatory Visit: Payer: PPO | Admitting: Internal Medicine

## 2018-11-17 ENCOUNTER — Encounter: Payer: Self-pay | Admitting: Internal Medicine

## 2018-11-17 VITALS — BP 130/76 | HR 65 | Ht 62.0 in | Wt 139.8 lb

## 2018-11-17 DIAGNOSIS — I340 Nonrheumatic mitral (valve) insufficiency: Secondary | ICD-10-CM | POA: Diagnosis not present

## 2018-11-17 DIAGNOSIS — I4819 Other persistent atrial fibrillation: Secondary | ICD-10-CM | POA: Diagnosis not present

## 2018-11-17 DIAGNOSIS — G4733 Obstructive sleep apnea (adult) (pediatric): Secondary | ICD-10-CM

## 2018-11-17 DIAGNOSIS — I5189 Other ill-defined heart diseases: Secondary | ICD-10-CM

## 2018-11-17 DIAGNOSIS — I629 Nontraumatic intracranial hemorrhage, unspecified: Secondary | ICD-10-CM

## 2018-11-17 MED ORDER — AMIODARONE HCL 200 MG PO TABS: 100.0000 mg | ORAL_TABLET | Freq: Every day | ORAL | 3 refills | Status: DC

## 2018-11-17 NOTE — Patient Instructions (Addendum)
Medication Instructions:  Your physician has recommended you make the following change in your medication:   1.  Decrease your AMIODARONE 200 mg--Take 1/2 tablet by mouth ONCE daily.  Labwork: You will get lab work today:  BMP, CBC, TSH and Liver panel  Testing/Procedures: Your physician has requested that you have an echocardiogram. Echocardiography is a painless test that uses sound waves to create images of your heart. It provides your doctor with information about the size and shape of your heart and how well your heart's chambers and valves are working. This procedure takes approximately one hour. There are no restrictions for this procedure.  Your ECHO will be on the same day as your follow up appointment with Renee  Follow-Up: Your physician wants you to follow-up in: 6 months with Tommye Standard, PA.    You will have an ECHO done on the same day as your follow up appointment with Renee.  You will receive a reminder letter in the mail two months in advance. If you don't receive a letter, please call our office to schedule the follow-up appointment.   Any Other Special Instructions Will Be Listed Below (If Applicable).  If you need a refill on your cardiac medications before your next appointment, please call your pharmacy.

## 2018-11-17 NOTE — Progress Notes (Signed)
PCP: Thomes Dinning, MD Primary Cardiologist: Dr [name] Primary EP: Dr Francia Greaves Teresa York is a 78 y.o. female who presents today for routine electrophysiology followup.  Since last being seen in our clinic, the patient reports doing very well.  Today, she denies symptoms of palpitations, chest pain, shortness of breath,  lower extremity edema, dizziness, presyncope, or syncope.  The patient is otherwise without complaint today.   Past Medical History:  Diagnosis Date  . Allergic rhinitis   . Anxiety   . Atrial fibrillation ([place])   . Barrett's esophagus   . Breast cancer ([place]) 2005   "left"  . Chronic lower back pain   . Chronic systolic CHF (congestive heart failure) (HCC)    a. EF 30-35% felt to be possibly be tachy mediated from afib wtih RVR  . Depression   . Diverticulosis   . GERD (gastroesophageal reflux disease)   . Hiatal hernia   . Hypertension   . Hypothyroidism   . Left atrial enlargement   . Mitral regurgitation   . OAB (overactive bladder)   . Persistent atrial fibrillation 10/31/2016   a. s/p failed DCCV x3. Now on amiodarone.   . 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 Stroke [facility])    Past Surgical History:  Procedure Laterality Date  . BACK SURGERY    . BREAST BIOPSY Left 2005  . BREAST LUMPECTOMY Left 2005  . CARDIOVERSION N/A 11/02/2016   Procedure: CARDIOVERSION;  Surgeon: Thayer Headings, MD;  Location: [place];  Service: Cardiovascular;  Laterality: N/A;  . CARDIOVERSION N/A 11/16/2016   Procedure: CARDIOVERSION;  Surgeon: Fay Records, MD;  Location: [place];  Service: Cardiovascular;  Laterality: N/A;  . CARDIOVERSION N/A 12/24/2016   Procedure: CARDIOVERSION;  Surgeon: Sanda Klein, MD;  [facility];  Service: Cardiovascular;  Laterality: N/A;  . CARPAL TUNNEL RELEASE Bilateral 1995  . COLONOSCOPY    . ELBOW FRACTURE SURGERY Right 2009   with implant  . ESOPHAGOGASTRODUODENOSCOPY    .  ESOPHAGUS SURGERY     "in the process of getting cancerous cell off my esophagus" (10/31/2016)  . FRACTURE SURGERY    . IR CT HEAD LTD  11/14/2017  . IR PERCUTANEOUS ART THROMBECTOMY/INFUSION INTRACRANIAL INC DIAG ANGIO  11/14/2017  . LUMBAR DISC SURGERY     L5  . MOHS SURGERY     "corner of right eye; on left cheek"  . RADIOLOGY WITH ANESTHESIA N/A 11/13/2017   Procedure: IR WITH ANESTHESIA;  Surgeon: Radiologist, Medication, MD;  Location: [place];  Service: Radiology;  Laterality: N/A;  . TEE WITHOUT CARDIOVERSION N/A 11/16/2016   Procedure: TRANSESOPHAGEAL ECHOCARDIOGRAM (TEE);  Surgeon: Fay Records, MD;  Location: [place];  Service: Cardiovascular;  Laterality: N/A;  . TONSILLECTOMY    . TUBAL LIGATION  1980    ROS- all systems are reviewed and negatives except as per HPI above  Current Outpatient Medications  Medication Sig Dispense Refill  . acetaminophen (TYLENOL) 325 MG tablet Take 650 mg by mouth 2 (two) times daily as needed for mild pain.     Marland Kitchen amiodarone (PACERONE) 200 MG tablet Take 100 mg by mouth 2 (two) times daily.    Marland Kitchen apixaban (ELIQUIS) 5 MG TABS tablet Take 1 tablet (5 mg total) by mouth 2 (two) times daily. 180 tablet 3  . cetirizine (ZYRTEC) 10 MG tablet Take 10 mg by mouth daily. Reported on 10/27/2015    . Cholecalciferol (D 1000)  1000 units capsule Take 1,000 Units by mouth daily. Reported on 11/03/2015    . FLUoxetine (PROZAC) 40 MG capsule Take 40 mg by mouth daily.    . fluticasone (FLONASE) 50 MCG/ACT nasal spray Place 1 spray into both nostrils daily as needed for allergies.     Marland Kitchen levothyroxine (SYNTHROID, LEVOTHROID) 125 MCG tablet Take 125 mcg by mouth daily before breakfast.    . Magnesium Oxide (MAGOX 400 PO) Take 400 mg by mouth daily.     . meclizine (ANTIVERT) 25 MG tablet Take 25 mg by mouth 3 (three) times daily as needed for dizziness or nausea.     . mirtazapine (REMERON) 15 MG tablet Take 15 mg by mouth at bedtime.     . Multiple Vitamins-Minerals  (ICAPS) CAPS Take 1 capsule by mouth daily.    Marland Kitchen omeprazole (PRILOSEC) 40 MG capsule Take 40 mg by mouth 2 (two) times daily.    . ondansetron (ZOFRAN) 4 MG tablet Take 4 mg by mouth every 8 (eight) hours as needed for nausea or vomiting.   1  . Probiotic Product (DAILY PROBIOTIC) CAPS Take 1 tablet by mouth daily.    . traZODone (DESYREL) 150 MG tablet Take 75 mg by mouth at bedtime.  0   No current facility-administered medications for this visit.     Physical Exam: Vitals:   11/17/18 1543  BP: 130/76  Pulse: 65  SpO2: 93%  Weight: 139 lb 12.8 oz (63.4 kg)  Height: 5\' 2"  (1.575 m)    GEN- The patient is chronically ill appearing, alert and oriented x 3 today.  In a wheelchair today Head- normocephalic, atraumatic Eyes-  Sclera clear, conjunctiva pink Ears- hearing intact Oropharynx- clear Lungs- Clear to ausculation bilaterally, normal work of breathing Heart- Regular rate and rhythm, 2/6 SEM at the apex GI- soft, NT, ND, + BS Extremities- no clubbing, cyanosis, + dependant edema  Wt Readings from Last 3 Encounters:  11/17/18 139 lb 12.8 oz (63.4 kg)  08/03/18 150 lb (68 kg)  06/12/18 160 lb (72.6 kg)    EKG tracing ordered today is personally reviewed and shows sinus rhythm  60 bpm, PR 212 msec, RBBB, LAD, Qtc 548 msec  Assessment and Plan:  1. Persistent afib Maintaining sinus rhythm She takes amiodarone 100mg  BID Reduce to 100mg  daily  She is on eliquis.  Will need to follow closely.  Her creatinine a year ago was 1.5.  May need to reduce her dose when she turns 64 in November, particularly given prior ICH. Check lfts, tfts cbc and bmet today  2. OSA Compliant with CPAP  3. Chronic diastolic dysfunction/ moderate MR/ moderate AI Stable clinically No change required today  Repeat echo on return to see EP PA in 6 months   Thompson Grayer MD, [facility] 11/17/2018 3:47 PM

## 2018-11-18 ENCOUNTER — Other Ambulatory Visit: Payer: Self-pay

## 2018-11-18 LAB — CBC WITH DIFFERENTIAL/PLATELET
BASOS ABS: 0.1 10*3/uL (ref 0.0–0.2)
Basos: 1 %
EOS (ABSOLUTE): 0.1 10*3/uL (ref 0.0–0.4)
EOS: 1 %
HEMATOCRIT: 40.2 % (ref 34.0–46.6)
Hemoglobin: 12.9 g/dL (ref 11.1–15.9)
IMMATURE GRANULOCYTES: 0 %
Immature Grans (Abs): 0 10*3/uL (ref 0.0–0.1)
LYMPHS ABS: 0.7 10*3/uL (ref 0.7–3.1)
Lymphs: 8 %
MCH: 28.6 pg (ref 26.6–33.0)
MCHC: 32.1 g/dL (ref 31.5–35.7)
MCV: 89 fL (ref 79–97)
MONOS ABS: 0.5 10*3/uL (ref 0.1–0.9)
Monocytes: 6 %
NEUTROS PCT: 84 %
Neutrophils Absolute: 7.8 10*3/uL — ABNORMAL HIGH (ref 1.4–7.0)
PLATELETS: 241 10*3/uL (ref 150–450)
RBC: 4.51 x10E6/uL (ref 3.77–5.28)
RDW: 13.9 % (ref 11.7–15.4)
WBC: 9.3 10*3/uL (ref 3.4–10.8)

## 2018-11-18 LAB — HEPATIC FUNCTION PANEL
ALBUMIN: 3.4 g/dL — AB (ref 3.7–4.7)
ALK PHOS: 161 IU/L — AB (ref 39–117)
ALT: 27 IU/L (ref 0–32)
AST: 67 IU/L — ABNORMAL HIGH (ref 0–40)
Bilirubin Total: 0.5 mg/dL (ref 0.0–1.2)
Bilirubin, Direct: 0.18 mg/dL (ref 0.00–0.40)
TOTAL PROTEIN: 6.7 g/dL (ref 6.0–8.5)

## 2018-11-18 LAB — BASIC METABOLIC PANEL
BUN/Creatinine Ratio: 13 (ref 12–28)
BUN: 14 mg/dL (ref 8–27)
CALCIUM: 9.4 mg/dL (ref 8.7–10.3)
CHLORIDE: 101 mmol/L (ref 96–106)
CO2: 24 mmol/L (ref 20–29)
Creatinine, Ser: 1.04 mg/dL — ABNORMAL HIGH (ref 0.57–1.00)
GFR calc Af Amer: 59 mL/min/{1.73_m2} — ABNORMAL LOW (ref >59)
GFR calc non Af Amer: 51 mL/min/{1.73_m2} — ABNORMAL LOW (ref >59)
GLUCOSE: 104 mg/dL — AB (ref 65–99)
POTASSIUM: 3.5 mmol/L (ref 3.5–5.2)
SODIUM: 142 mmol/L (ref 134–144)

## 2018-11-18 LAB — TSH: TSH: 4.9 u[IU]/mL — AB (ref 0.450–4.500)

## 2018-11-18 NOTE — Patient Outreach (Signed)
[facility]) Care Management  11/18/2018  Teresa York May 15, 1941 [id number]  Nurse call line referral Referral date:11/10/18 Referral reason:Prescription refill / medication request Insurance:Health team advantage  Telephone call to patient regarding nurse call line referral follow up. Contact made with patients spouse and designated party release, Malachi Bonds.  HIPAA verified by spouse for patient. Explained reason for call.  Spouse states patient has all of her medications and is taking them as prescribed. Spouse states patient did not have to go without her medication because he went to the pharmacy to get enough of her medications to last her until [place].  Spouse states he was able to get to the beach house this past weekend and pick up patients original prescriptions. Spouse denied any further needs or concerns at this time.   Spouse voiced appreciation for follow up call.     PLAN: RNCM will close patient due to being assessed and having no further needs.   Quinn Plowman RN,BSN,CCM [facility] Telephonic  [phone]

## 2018-11-19 ENCOUNTER — Encounter: Payer: Self-pay | Admitting: Adult Health

## 2018-11-22 DIAGNOSIS — R296 Repeated falls: Secondary | ICD-10-CM | POA: Diagnosis not present

## 2018-11-22 DIAGNOSIS — Z7409 Other reduced mobility: Secondary | ICD-10-CM | POA: Diagnosis not present

## 2018-11-22 DIAGNOSIS — K219 Gastro-esophageal reflux disease without esophagitis: Secondary | ICD-10-CM | POA: Diagnosis not present

## 2018-11-22 DIAGNOSIS — I5022 Chronic systolic (congestive) heart failure: Secondary | ICD-10-CM | POA: Diagnosis not present

## 2018-11-22 DIAGNOSIS — F329 Major depressive disorder, single episode, unspecified: Secondary | ICD-10-CM | POA: Diagnosis not present

## 2018-11-22 DIAGNOSIS — N183 Chronic kidney disease, stage 3 (moderate): Secondary | ICD-10-CM | POA: Diagnosis not present

## 2018-11-29 DIAGNOSIS — I5022 Chronic systolic (congestive) heart failure: Secondary | ICD-10-CM | POA: Diagnosis not present

## 2018-11-29 DIAGNOSIS — F329 Major depressive disorder, single episode, unspecified: Secondary | ICD-10-CM | POA: Diagnosis not present

## 2018-11-29 DIAGNOSIS — Z7409 Other reduced mobility: Secondary | ICD-10-CM | POA: Diagnosis not present

## 2018-11-29 DIAGNOSIS — R296 Repeated falls: Secondary | ICD-10-CM | POA: Diagnosis not present

## 2018-11-29 DIAGNOSIS — N183 Chronic kidney disease, stage 3 (moderate): Secondary | ICD-10-CM | POA: Diagnosis not present

## 2018-11-29 DIAGNOSIS — K219 Gastro-esophageal reflux disease without esophagitis: Secondary | ICD-10-CM | POA: Diagnosis not present

## 2018-12-05 DIAGNOSIS — E039 Hypothyroidism, unspecified: Secondary | ICD-10-CM | POA: Diagnosis not present

## 2018-12-05 DIAGNOSIS — N183 Chronic kidney disease, stage 3 (moderate): Secondary | ICD-10-CM | POA: Diagnosis not present

## 2018-12-05 DIAGNOSIS — R634 Abnormal weight loss: Secondary | ICD-10-CM | POA: Diagnosis not present

## 2018-12-05 DIAGNOSIS — R110 Nausea: Secondary | ICD-10-CM | POA: Diagnosis not present

## 2018-12-05 DIAGNOSIS — E440 Moderate protein-calorie malnutrition: Secondary | ICD-10-CM | POA: Diagnosis not present

## 2018-12-05 DIAGNOSIS — F339 Major depressive disorder, recurrent, unspecified: Secondary | ICD-10-CM | POA: Diagnosis not present

## 2018-12-05 DIAGNOSIS — R1084 Generalized abdominal pain: Secondary | ICD-10-CM | POA: Diagnosis not present

## 2018-12-05 DIAGNOSIS — M6284 Sarcopenia: Secondary | ICD-10-CM | POA: Diagnosis not present

## 2018-12-05 DIAGNOSIS — D649 Anemia, unspecified: Secondary | ICD-10-CM | POA: Diagnosis not present

## 2018-12-06 DIAGNOSIS — I5022 Chronic systolic (congestive) heart failure: Secondary | ICD-10-CM | POA: Diagnosis not present

## 2018-12-06 DIAGNOSIS — N183 Chronic kidney disease, stage 3 (moderate): Secondary | ICD-10-CM | POA: Diagnosis not present

## 2018-12-06 DIAGNOSIS — F329 Major depressive disorder, single episode, unspecified: Secondary | ICD-10-CM | POA: Diagnosis not present

## 2018-12-06 DIAGNOSIS — Z7409 Other reduced mobility: Secondary | ICD-10-CM | POA: Diagnosis not present

## 2018-12-06 DIAGNOSIS — K219 Gastro-esophageal reflux disease without esophagitis: Secondary | ICD-10-CM | POA: Diagnosis not present

## 2018-12-06 DIAGNOSIS — R296 Repeated falls: Secondary | ICD-10-CM | POA: Diagnosis not present

## 2018-12-11 ENCOUNTER — Ambulatory Visit: Payer: Medicare HMO | Admitting: Adult Health

## 2018-12-11 DIAGNOSIS — R600 Localized edema: Secondary | ICD-10-CM | POA: Diagnosis not present

## 2018-12-11 DIAGNOSIS — J90 Pleural effusion, not elsewhere classified: Secondary | ICD-10-CM | POA: Diagnosis not present

## 2018-12-11 DIAGNOSIS — K449 Diaphragmatic hernia without obstruction or gangrene: Secondary | ICD-10-CM | POA: Diagnosis not present

## 2018-12-11 DIAGNOSIS — D259 Leiomyoma of uterus, unspecified: Secondary | ICD-10-CM | POA: Diagnosis not present

## 2018-12-13 DIAGNOSIS — K219 Gastro-esophageal reflux disease without esophagitis: Secondary | ICD-10-CM | POA: Diagnosis not present

## 2018-12-13 DIAGNOSIS — Z7409 Other reduced mobility: Secondary | ICD-10-CM | POA: Diagnosis not present

## 2018-12-13 DIAGNOSIS — N183 Chronic kidney disease, stage 3 (moderate): Secondary | ICD-10-CM | POA: Diagnosis not present

## 2018-12-13 DIAGNOSIS — R296 Repeated falls: Secondary | ICD-10-CM | POA: Diagnosis not present

## 2018-12-13 DIAGNOSIS — F329 Major depressive disorder, single episode, unspecified: Secondary | ICD-10-CM | POA: Diagnosis not present

## 2018-12-13 DIAGNOSIS — I5022 Chronic systolic (congestive) heart failure: Secondary | ICD-10-CM | POA: Diagnosis not present

## 2018-12-26 DIAGNOSIS — E440 Moderate protein-calorie malnutrition: Secondary | ICD-10-CM | POA: Diagnosis not present

## 2018-12-26 DIAGNOSIS — I5032 Chronic diastolic (congestive) heart failure: Secondary | ICD-10-CM | POA: Diagnosis not present

## 2018-12-26 DIAGNOSIS — R630 Anorexia: Secondary | ICD-10-CM | POA: Diagnosis not present

## 2018-12-26 DIAGNOSIS — E039 Hypothyroidism, unspecified: Secondary | ICD-10-CM | POA: Diagnosis not present

## 2018-12-26 DIAGNOSIS — L89321 Pressure ulcer of left buttock, stage 1: Secondary | ICD-10-CM | POA: Diagnosis not present

## 2018-12-26 DIAGNOSIS — R296 Repeated falls: Secondary | ICD-10-CM | POA: Diagnosis not present

## 2018-12-26 DIAGNOSIS — F339 Major depressive disorder, recurrent, unspecified: Secondary | ICD-10-CM | POA: Diagnosis not present

## 2018-12-26 DIAGNOSIS — N183 Chronic kidney disease, stage 3 (moderate): Secondary | ICD-10-CM | POA: Diagnosis not present

## 2018-12-27 DIAGNOSIS — R296 Repeated falls: Secondary | ICD-10-CM | POA: Diagnosis not present

## 2018-12-27 DIAGNOSIS — Z7409 Other reduced mobility: Secondary | ICD-10-CM | POA: Diagnosis not present

## 2018-12-27 DIAGNOSIS — K219 Gastro-esophageal reflux disease without esophagitis: Secondary | ICD-10-CM | POA: Diagnosis not present

## 2018-12-27 DIAGNOSIS — I5022 Chronic systolic (congestive) heart failure: Secondary | ICD-10-CM | POA: Diagnosis not present

## 2018-12-27 DIAGNOSIS — F329 Major depressive disorder, single episode, unspecified: Secondary | ICD-10-CM | POA: Diagnosis not present

## 2018-12-27 DIAGNOSIS — N183 Chronic kidney disease, stage 3 (moderate): Secondary | ICD-10-CM | POA: Diagnosis not present

## 2019-01-03 DIAGNOSIS — N183 Chronic kidney disease, stage 3 (moderate): Secondary | ICD-10-CM | POA: Diagnosis not present

## 2019-01-03 DIAGNOSIS — K219 Gastro-esophageal reflux disease without esophagitis: Secondary | ICD-10-CM | POA: Diagnosis not present

## 2019-01-03 DIAGNOSIS — Z7409 Other reduced mobility: Secondary | ICD-10-CM | POA: Diagnosis not present

## 2019-01-03 DIAGNOSIS — F329 Major depressive disorder, single episode, unspecified: Secondary | ICD-10-CM | POA: Diagnosis not present

## 2019-01-03 DIAGNOSIS — R296 Repeated falls: Secondary | ICD-10-CM | POA: Diagnosis not present

## 2019-01-03 DIAGNOSIS — I5022 Chronic systolic (congestive) heart failure: Secondary | ICD-10-CM | POA: Diagnosis not present

## 2019-01-12 ENCOUNTER — Encounter: Payer: Self-pay | Admitting: *Deleted

## 2019-01-19 DIAGNOSIS — I480 Paroxysmal atrial fibrillation: Secondary | ICD-10-CM | POA: Diagnosis not present

## 2019-01-19 DIAGNOSIS — Z79899 Other long term (current) drug therapy: Secondary | ICD-10-CM | POA: Diagnosis not present

## 2019-01-19 DIAGNOSIS — I5032 Chronic diastolic (congestive) heart failure: Secondary | ICD-10-CM | POA: Diagnosis not present

## 2019-01-19 DIAGNOSIS — R630 Anorexia: Secondary | ICD-10-CM | POA: Diagnosis not present

## 2019-01-19 DIAGNOSIS — E440 Moderate protein-calorie malnutrition: Secondary | ICD-10-CM | POA: Diagnosis not present

## 2019-01-19 DIAGNOSIS — E039 Hypothyroidism, unspecified: Secondary | ICD-10-CM | POA: Diagnosis not present

## 2019-01-19 DIAGNOSIS — F339 Major depressive disorder, recurrent, unspecified: Secondary | ICD-10-CM | POA: Diagnosis not present

## 2019-01-22 DIAGNOSIS — Z79899 Other long term (current) drug therapy: Secondary | ICD-10-CM | POA: Diagnosis not present

## 2019-01-22 DIAGNOSIS — Z5181 Encounter for therapeutic drug level monitoring: Secondary | ICD-10-CM | POA: Diagnosis not present

## 2019-01-22 DIAGNOSIS — E039 Hypothyroidism, unspecified: Secondary | ICD-10-CM | POA: Diagnosis not present

## 2019-01-22 DIAGNOSIS — I5032 Chronic diastolic (congestive) heart failure: Secondary | ICD-10-CM | POA: Diagnosis not present

## 2019-01-30 IMAGING — CT CT HEAD W/O CM
3 series · 15 of 47 positions shown, 18 images · non-contrast
Comparison: 03/09/2018.

CLINICAL DATA: Headache. Stroke last week.

EXAM:
CT HEAD WITHOUT CONTRAST
TECHNIQUE: Contiguous axial images were obtained from the base of the skull
through the vertex without intravenous contrast.

[Series 3: head 5.0 h30s · axial · 0.42mm/px · z∈[-76,+59]mm · 9 of 33 slices shown, 12 images]
[im 3/33  brain]
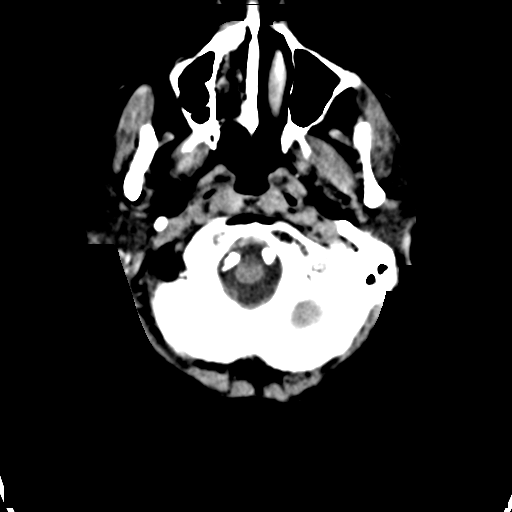
[im 3/33  bone]
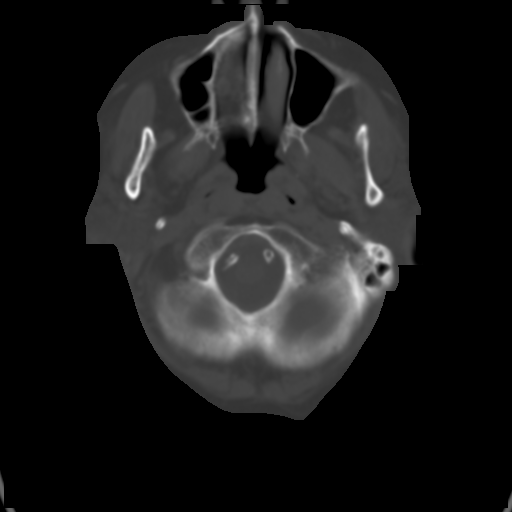
[im 6/33  brain]
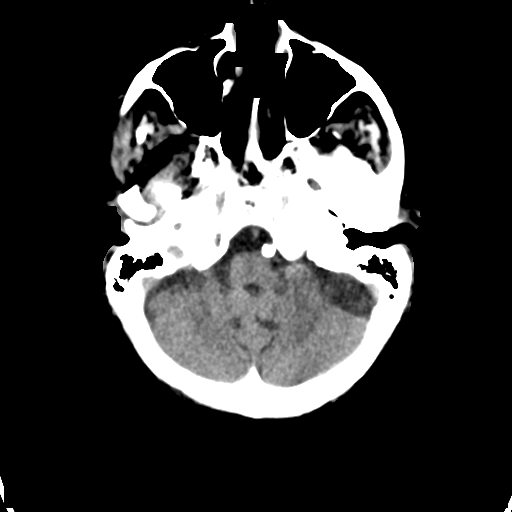
[im 9/33  brain]
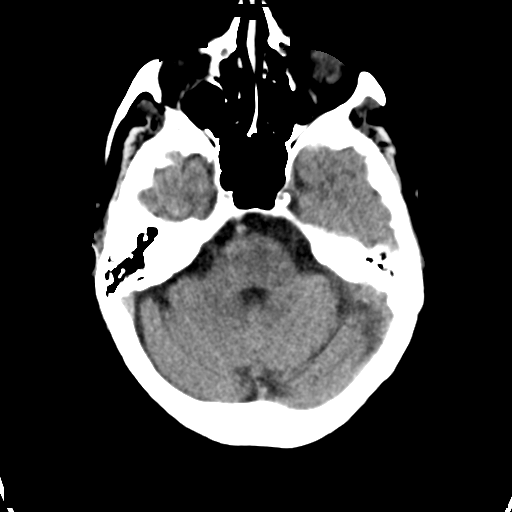
[im 13/33  brain]
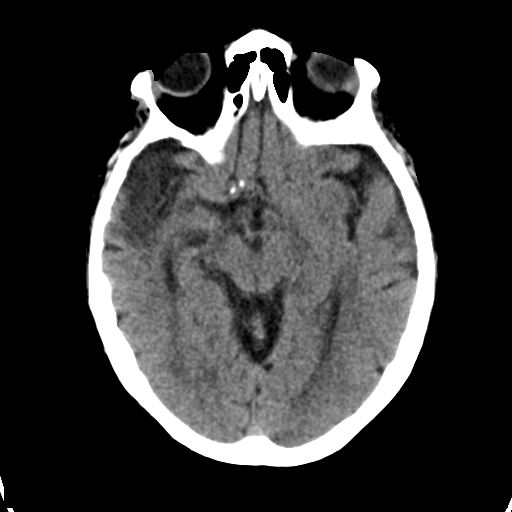
[im 17/33  brain]
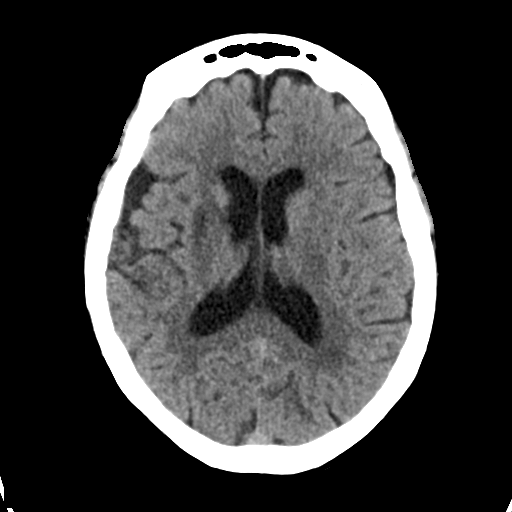
[im 17/33  bone]
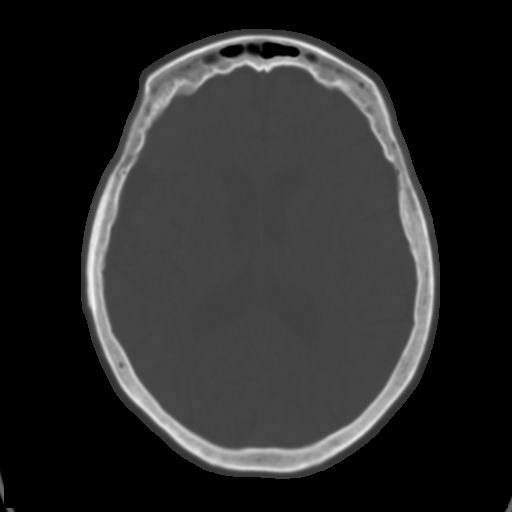
[im 20/33  brain]
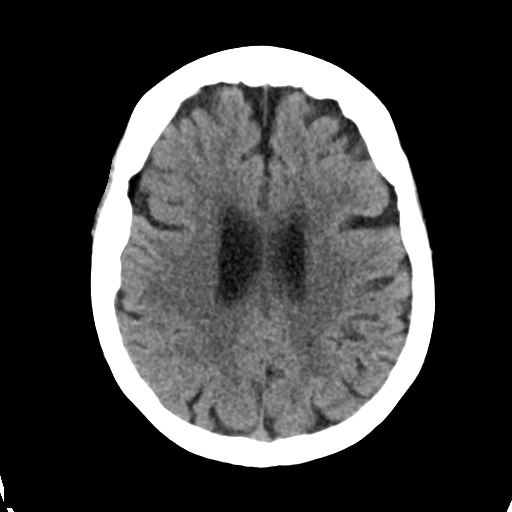
[im 24/33  brain]
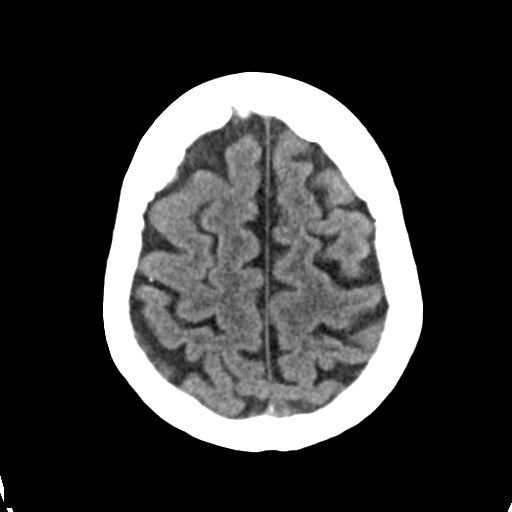
[im 27/33  brain]
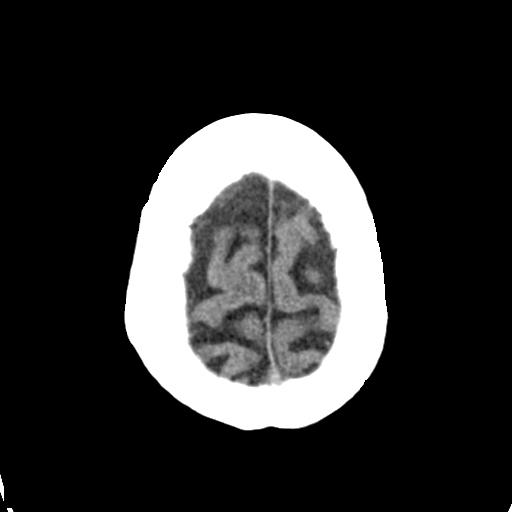
[im 30/33  brain]
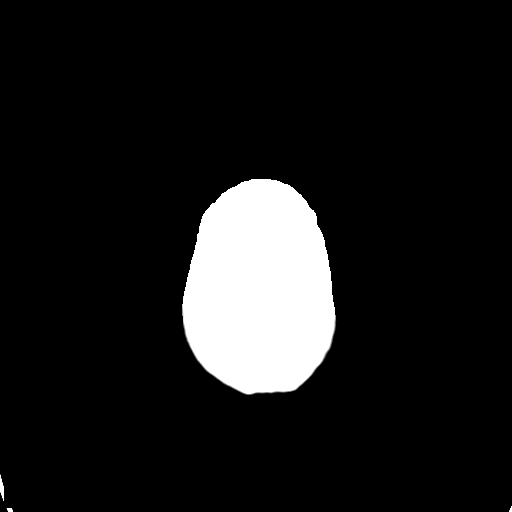
[im 30/33  bone]
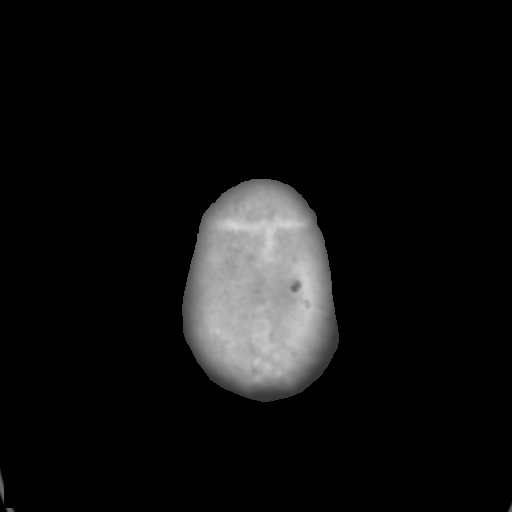

[Series 5: head 3.0 mpr cor · coronal · 0.32mm/px · 3 of 73 slices shown]
[im 25/73  brain]
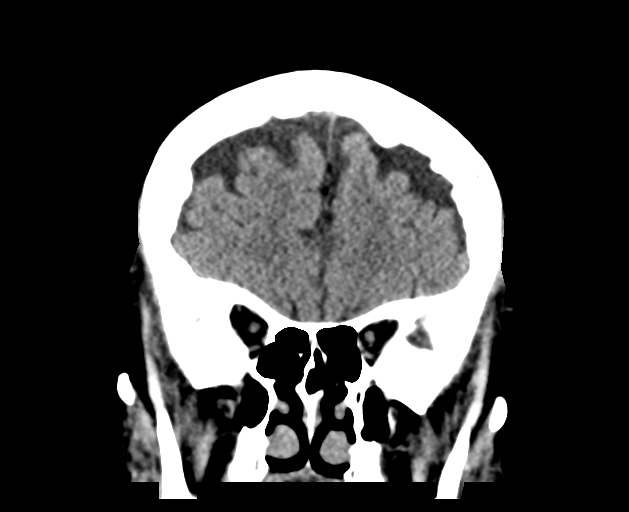
[im 33/73  brain]
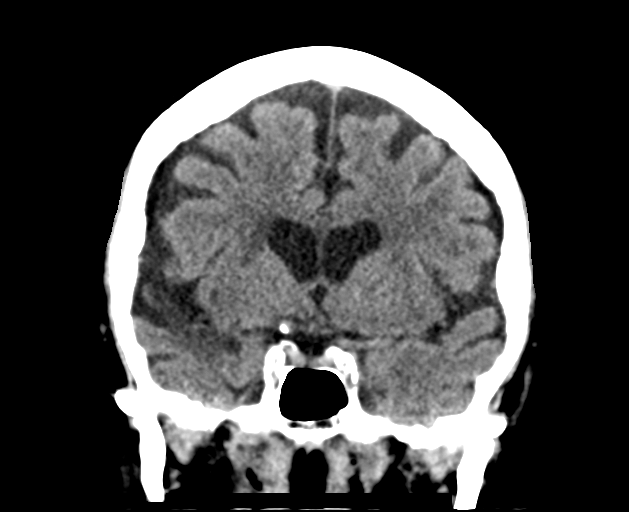
[im 41/73  brain]
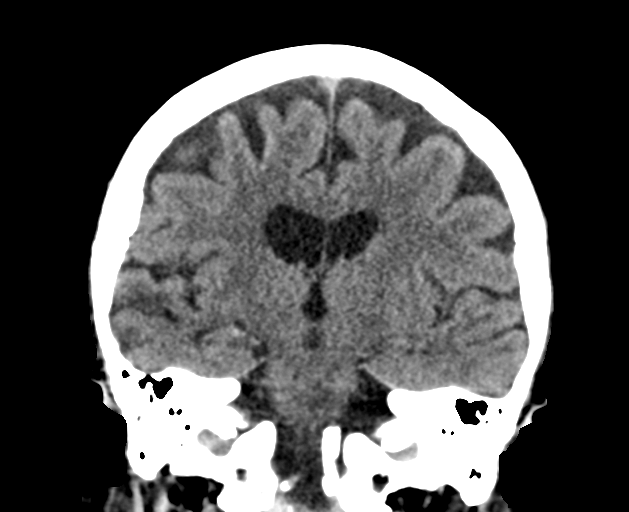

[Series 6: head 3.0 mpr sag · sagittal · 0.32mm/px · 3 of 67 slices shown]
[im 23/67  brain]
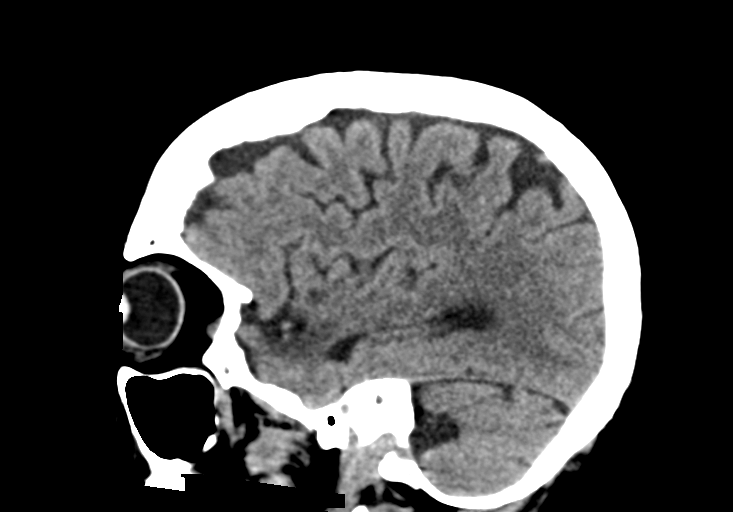
[im 34/67  brain]
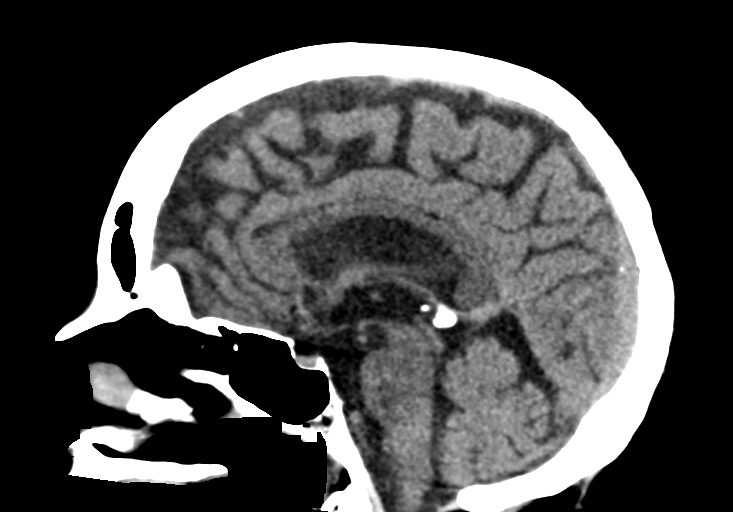
[im 45/67  brain]
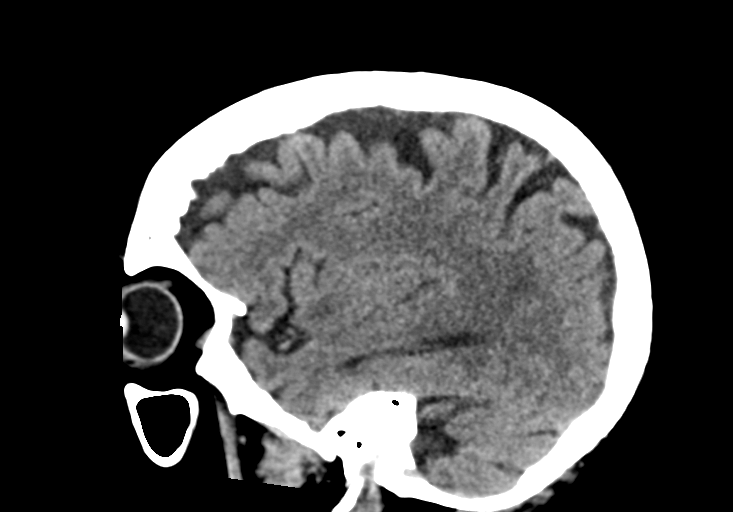

[15 of 47 positions shown; findings below may reference images not displayed]

FINDINGS: Brain: Stable moderately enlarged ventricles and subarachnoid
spaces. Stable mild patchy white matter low density in both cerebral
hemispheres. Stable old right temporal lobe and basal ganglia middle
cerebral artery distribution infarcts. No intracranial hemorrhage,
mass lesion or CT evidence of acute infarction.

Vascular: No hyperdense vessel or unexpected calcification.

Skull: Bilateral hyperostosis frontalis.

Sinuses/Orbits: Unremarkable.

Other: None.
IMPRESSION: 1. No acute abnormality.
2. Stable atrophy, chronic small vessel white matter ischemic
changes and old right middle cerebral artery distribution infarcts.

## 2019-01-30 IMAGING — DX DG RIBS W/ CHEST 3+V*L*
3 series · 3 of 3 positions shown · non-contrast
Comparison: Chest radiographs dated 04/16/2018

CLINICAL DATA: Multiple falls, left lower rib pain

EXAM:
LEFT RIBS AND CHEST - 3+ VIEW

[chest ap]
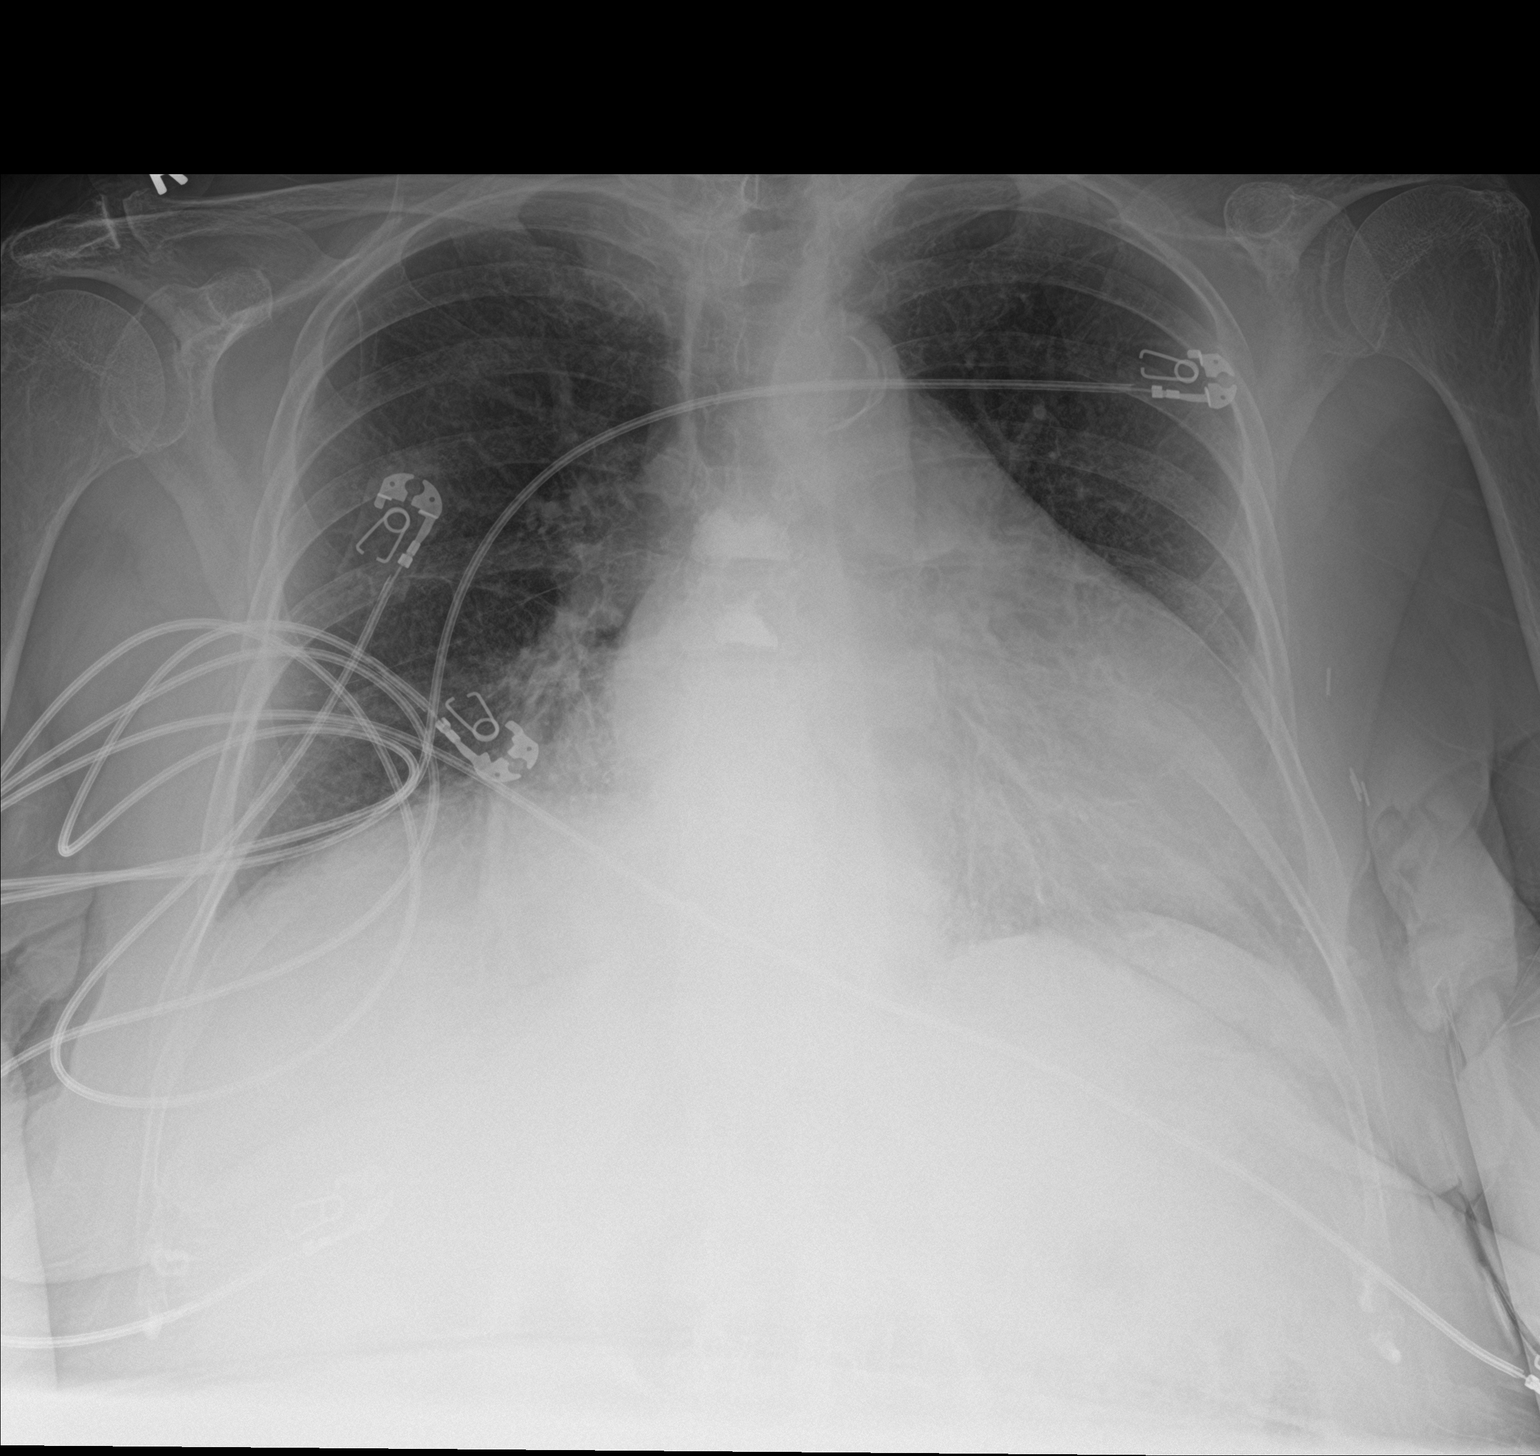

[rib ap]
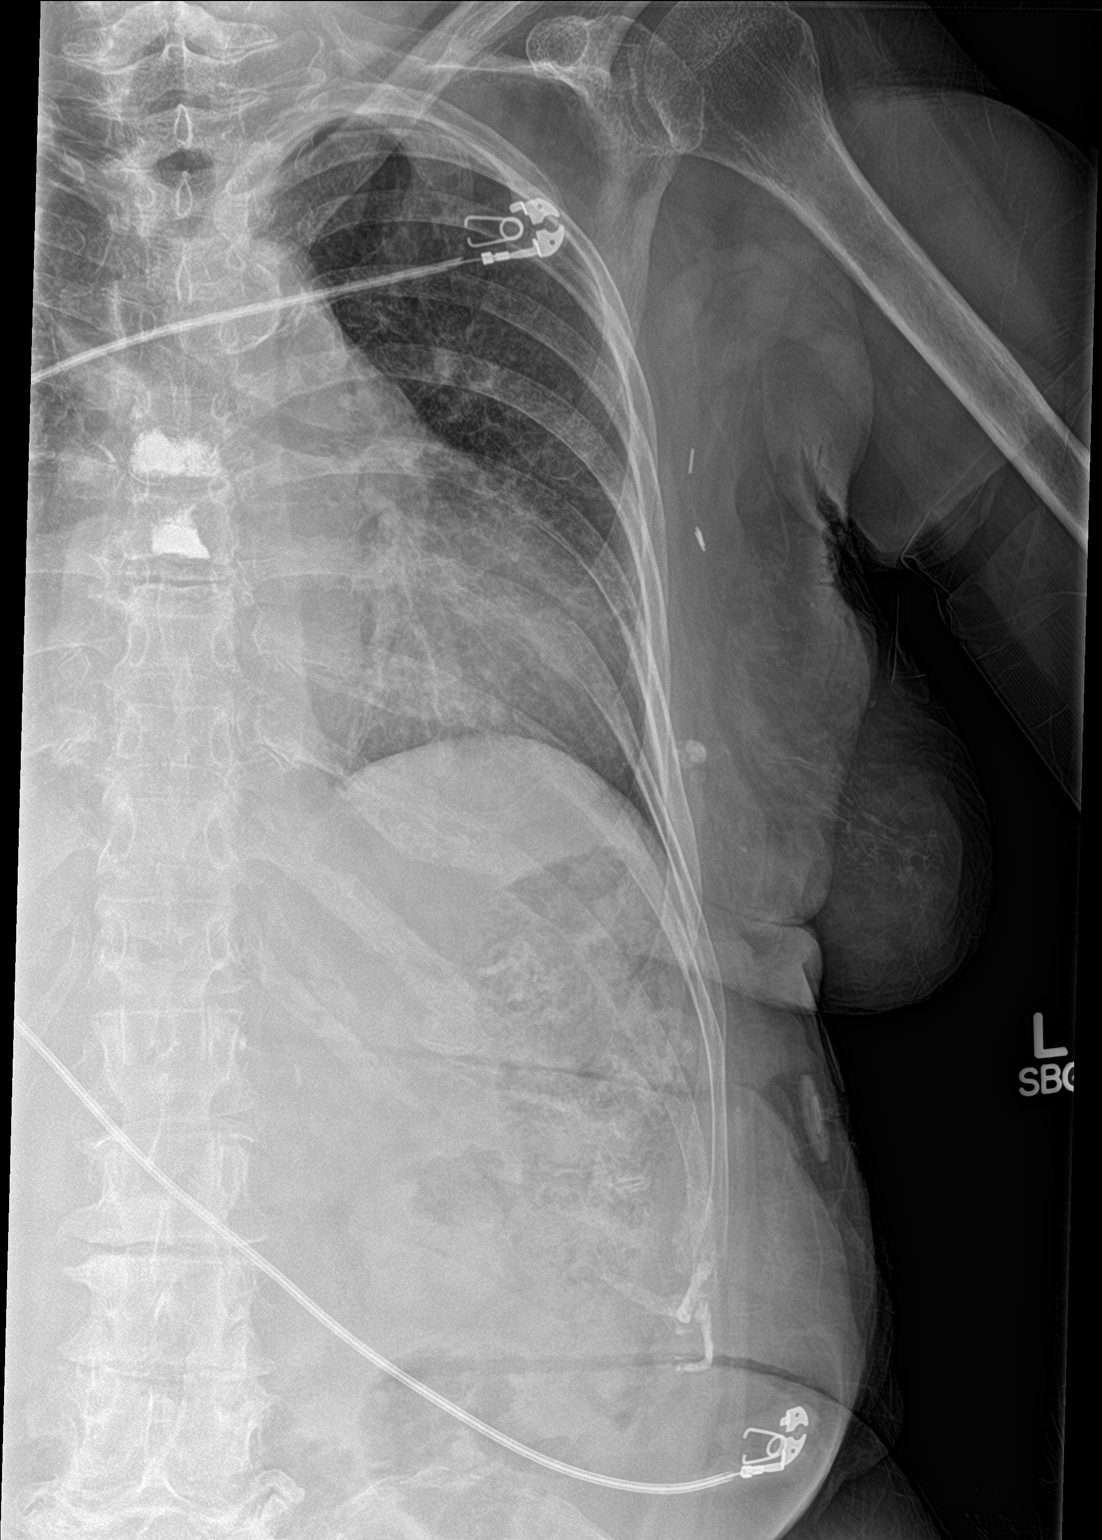

[rib ap obl]
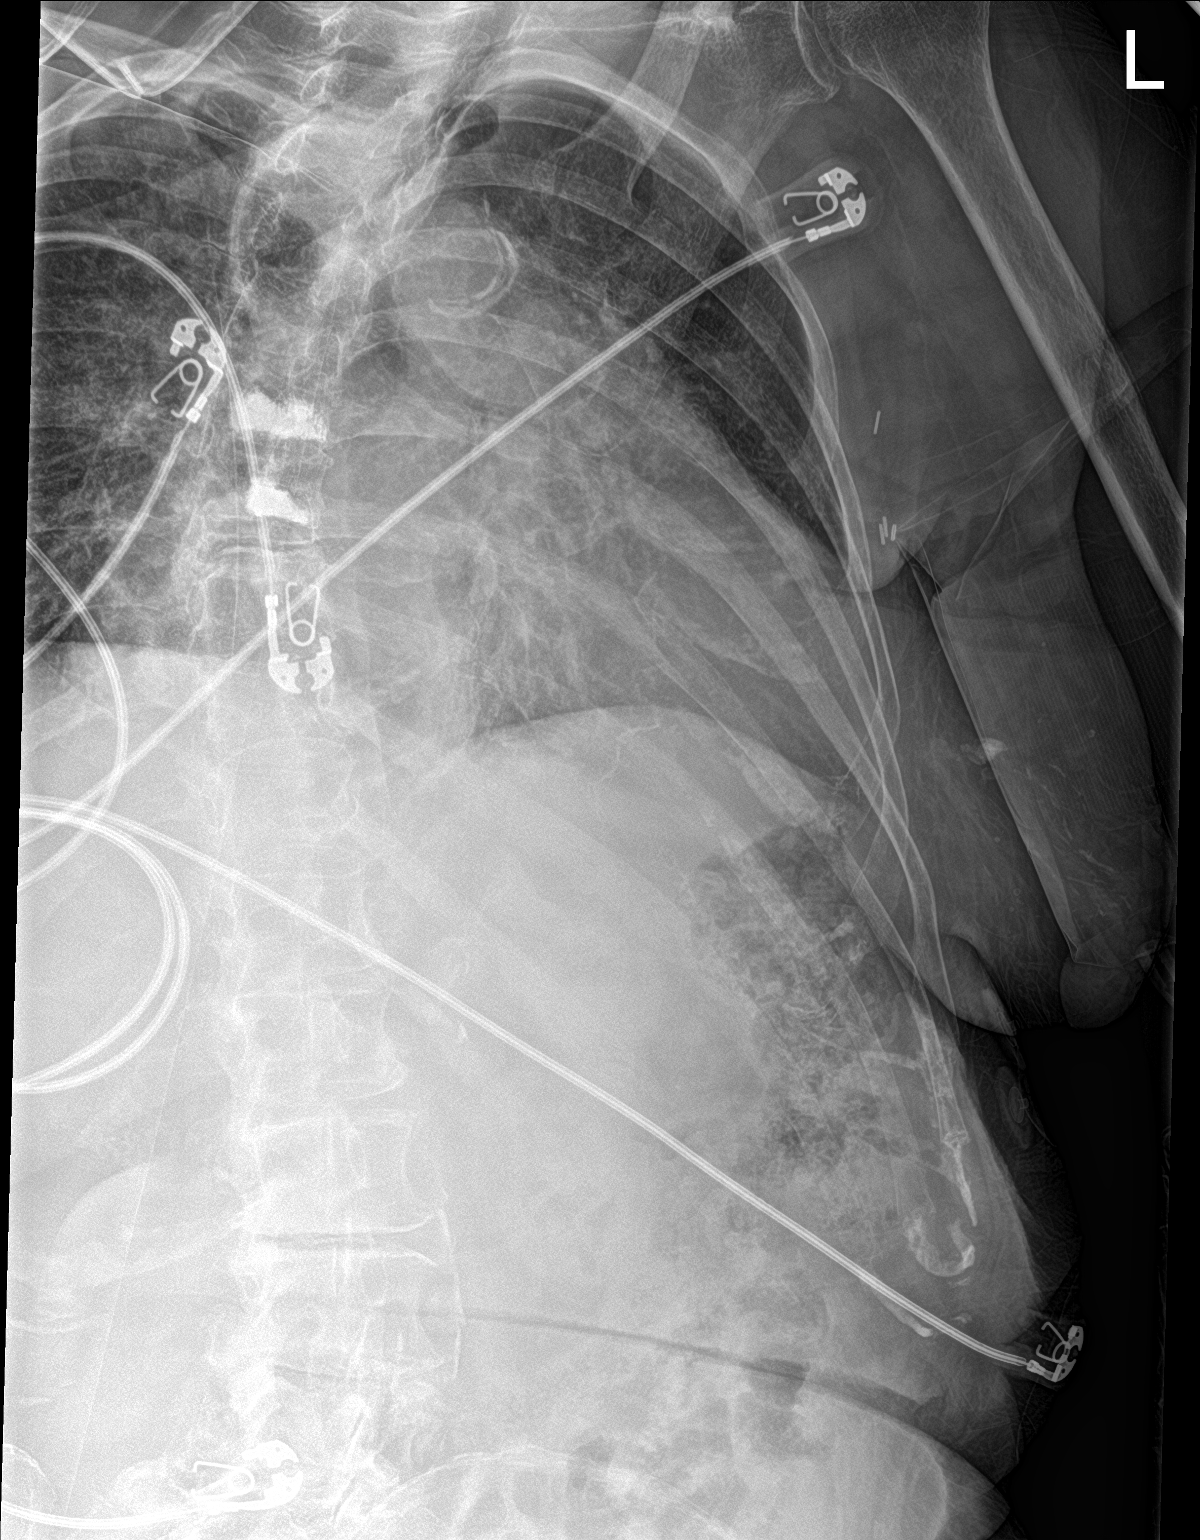

[3 of 3 positions shown; findings below may reference images not displayed]

FINDINGS: Lungs are clear.  No pleural effusion or pneumothorax.

Mild eventration of the right hemidiaphragm.

The heart is top-normal in size.

Vertebral augmentation of two midthoracic vertebral bodies.

No displaced left rib fracture is seen.

Surgical clips along the left lateral chest wall/axilla.
IMPRESSION: No evidence of acute cardiopulmonary disease.

No displaced left rib fracture is seen.

## 2019-02-06 DIAGNOSIS — I1310 Hypertensive heart and chronic kidney disease without heart failure, with stage 1 through stage 4 chronic kidney disease, or unspecified chronic kidney disease: Secondary | ICD-10-CM | POA: Diagnosis not present

## 2019-02-06 DIAGNOSIS — Z7409 Other reduced mobility: Secondary | ICD-10-CM | POA: Diagnosis not present

## 2019-02-06 DIAGNOSIS — E440 Moderate protein-calorie malnutrition: Secondary | ICD-10-CM | POA: Diagnosis not present

## 2019-02-06 DIAGNOSIS — I480 Paroxysmal atrial fibrillation: Secondary | ICD-10-CM | POA: Diagnosis not present

## 2019-02-06 DIAGNOSIS — N183 Chronic kidney disease, stage 3 (moderate): Secondary | ICD-10-CM | POA: Diagnosis not present

## 2019-02-06 DIAGNOSIS — I5032 Chronic diastolic (congestive) heart failure: Secondary | ICD-10-CM | POA: Diagnosis not present

## 2019-02-06 DIAGNOSIS — R630 Anorexia: Secondary | ICD-10-CM | POA: Diagnosis not present

## 2019-02-06 DIAGNOSIS — I10 Essential (primary) hypertension: Secondary | ICD-10-CM | POA: Diagnosis not present

## 2019-02-06 DIAGNOSIS — I69354 Hemiplegia and hemiparesis following cerebral infarction affecting left non-dominant side: Secondary | ICD-10-CM | POA: Diagnosis not present

## 2019-02-06 DIAGNOSIS — R29898 Other symptoms and signs involving the musculoskeletal system: Secondary | ICD-10-CM | POA: Diagnosis not present

## 2019-02-11 ENCOUNTER — Telehealth: Payer: Self-pay

## 2019-02-11 NOTE — Telephone Encounter (Signed)
I return husbands phone call about visit being change to video. He has a cell phone and number is [phone]. He has a new phone and does know how to receive or text if needed. I receive verbal consent to do video and file insurance from pts husband. I stated provider will send text message 5-10 minutes prior to visit . He will click the link 4-08 minutes before her scheduled appt time, enter her First and Last name, and the doctor will be with you shortly. I explain to make sure the camera is on and the audio He verbalized understanding.

## 2019-02-11 NOTE — Telephone Encounter (Signed)
Patient calling you back to schedule [phone]

## 2019-02-11 NOTE — Telephone Encounter (Signed)
I called pt that her visit will be change to video due to COVID 19. I stated it will not be a face to face visit. I ask if she or her husband had a cell phone.The wife stated they have new phones and are learning how to use them. I advise her to have Mr.Fabela to call me back about the set up. Pt verbalized understanding.

## 2019-02-11 NOTE — Telephone Encounter (Signed)
Open in error

## 2019-02-12 NOTE — Progress Notes (Signed)
[facility] [address] [place]. [place] [address] [phone]       FOLLOW-UP NOTE  Ms. Winslow Verrill Date of Birth:  09/16/1941 Medical Record Number:  [id number]   Virtual Visit via Video Note  I connected with Rich Fuchs on 02/12/19 at  9:45 AM EDT by a video enabled telemedicine application located remotely in my own home and verified that I am speaking with the correct person using two identifiers who was located at their own home and accompanied by her husband.   I discussed the limitations of evaluation and management by telemedicine and the availability of in person appointments. The patient expressed understanding and agreed to proceed.   HPI: Ms Flaugher is a 78 year pleasant Caucasian lady with underlying medical history of atrial fibrillation, chronic low back pain, CHF, HTN, OSA on CPAP, and right MCA infarct in 10/2017 after presenting with left-sided weakness, right gaze preference, dysarthria and left-sided facial droop.  She was scheduled today for stroke follow-up office visit but due to COVID-19 safety precautions, visit transition to telemedicine via [email or web address] with patient's consent.  She has been stable from a stroke standpoint with residual deficits of left hemiparesis with reported worsening likely due to deconditioning.  She does report multiple falls since prior visit and did have a short stay at [facility] for rehabilitation.  Attempted to receive additional home therapy with only one visit approved by insurance.  Limited functioning during the day due to difficulty ambulating with underlying CHF, lower extremity edema and left-sided weakness per patient.  She continues to use rolling walker at all times.  Her husband, she will be fitted for a brace of left ankle due to dorsiflexion weakness and dragging of left foot.  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  Continues on Eliquis  without side effects of bleeding or bruising along with atorvastatin without side effects myalgias.  Blood pressure stable.  Ongoing compliance with CPAP for OSA management.  No further concerns at this time.      ROS:   14 system review of systems is positive for weakness and all others negative  PMH:  Past Medical History:  Diagnosis Date   Allergic rhinitis    Anxiety    Atrial fibrillation (HCC)    Barrett's esophagus    Breast cancer ([place]) 2005   "left"   Chronic lower back pain    Chronic systolic CHF (congestive heart failure) (HCC)    a. EF 30-35% felt to be possibly be tachy mediated from afib wtih RVR   Depression    Diverticulosis    GERD (gastroesophageal reflux disease)    Hiatal hernia    Hypertension    Hypothyroidism    Left atrial enlargement    Mitral regurgitation    OAB (overactive bladder)    Persistent atrial fibrillation 10/31/2016   a. s/p failed DCCV x3. Now on amiodarone.    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)   Stroke [facility])     Social History:  Social History   Socioeconomic History   Marital status: Married    Spouse name: Not on file   Number of children: 2   Years of education: Not on file   Highest education level: Not on file  Occupational History   Occupation: Retired    Fish farm manager: RETIRED  Scientist, product/process development strain: Not on file   Food insecurity:  Worry: Not on file    Inability: Not on file   Transportation needs:    Medical: Not on file    Non-medical: Not on file  Tobacco Use   Smoking status: Never Smoker   Smokeless tobacco: Never Used  Substance and Sexual Activity   Alcohol use: Yes    Alcohol/week: 0.0 standard drinks    Comment: 1-2 per week   Drug use: No   Sexual activity: Not Currently  Lifestyle   Physical activity:    Days per week: Not on file    Minutes per session: Not on file   Stress: Not on file  Relationships    Social connections:    Talks on phone: Not on file    Gets together: Not on file    Attends religious service: Not on file    Active member of club or organization: Not on file    Attends meetings of clubs or organizations: Not on file    Relationship status: Not on file   Intimate partner violence:    Fear of current or ex partner: Not on file    Emotionally abused: Not on file    Physically abused: Not on file    Forced sexual activity: Not on file  Other Topics Concern   Not on file  Social History Narrative   Not on file    Medications:   Current Outpatient Medications on File Prior to Visit  Medication Sig Dispense Refill   acetaminophen (TYLENOL) 325 MG tablet Take 650 mg by mouth 2 (two) times daily as needed for mild pain.      amiodarone (PACERONE) 200 MG tablet Take 0.5 tablets (100 mg total) by mouth daily. 45 tablet 3   apixaban (ELIQUIS) 5 MG TABS tablet Take 1 tablet (5 mg total) by mouth 2 (two) times daily. 180 tablet 3   cetirizine (ZYRTEC) 10 MG tablet Take 10 mg by mouth daily. Reported on 10/27/2015     Cholecalciferol (D 1000) 1000 units capsule Take 1,000 Units by mouth daily. Reported on 11/03/2015     FLUoxetine (PROZAC) 40 MG capsule Take 40 mg by mouth daily.     fluticasone (FLONASE) 50 MCG/ACT nasal spray Place 1 spray into both nostrils daily as needed for allergies.      levothyroxine (SYNTHROID, LEVOTHROID) 125 MCG tablet Take 125 mcg by mouth daily before breakfast.     Magnesium Oxide (MAGOX 400 PO) Take 400 mg by mouth daily.      meclizine (ANTIVERT) 25 MG tablet Take 25 mg by mouth 3 (three) times daily as needed for dizziness or nausea.      mirtazapine (REMERON) 15 MG tablet Take 7.5 mg by mouth 2 (two) times daily.      Multiple Vitamins-Minerals (ICAPS) CAPS Take 1 capsule by mouth daily.     omeprazole (PRILOSEC) 40 MG capsule Take 40 mg by mouth 2 (two) times daily.     ondansetron (ZOFRAN) 4 MG tablet Take 4 mg by mouth every  8 (eight) hours as needed for nausea or vomiting.   1   Probiotic Product (DAILY PROBIOTIC) CAPS Take 1 tablet by mouth daily.     traZODone (DESYREL) 150 MG tablet Take 75 mg by mouth at bedtime.  0   No current facility-administered medications on file prior to visit.     Allergies:   Allergies  Allergen Reactions   Benazepril Cough    Cough   Diltiazem Hcl Rash    Physical Exam  General: well developed, well nourished elderly Caucasian lady, seated, in no evident distress Head: head normocephalic and atraumatic.   Neurologic Exam Mental Status: Awake and fully alert. Oriented to place and time. Recent and remote memory intact. Attention span, concentration and fund of knowledge appropriate. Mood and affect appropriate.  Cranial Nerves: Extraocular movements full without nystagmus. Hearing intact to voice. Face, tongue, palate moves normally and symmetrically. Should shrug symmetric.  Motor: LUE and LLE weakness per drift assessment. Decreased left hand finger tapping  Sensory.: intact to light touch Coordination: Rapid alternating movements normal in all extremities except for decreased left hand dexterity. Finger-to-nose and heel-to-shin performed accurately bilaterally.  Orbits right arm over left arm Gait and Station: Patient refusing to ambulate due to pain Reflexes: UTA   ASSESSMENT: Jora Galluzzo is a 78 y.o. 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  Stable from a stroke standpoint but overall likely deconditioning with worsening left hemiparesis secondary to difficulty ambulating due to worsening CHF and edema.    PLAN: -Continue Eliquis (apixaban) daily  and atorvastatin 20mg   for secondary stroke prevention -F/u with PCP regarding your HLD and HTN management -f/u with cardiologist for AF on [facility] management -continue to monitor BP at home -Referral placed  for outpatient PT neuro rehab due to overall deconditioning.  Insurance will not pay for additional home therapy -Maintain strict control of hypertension with blood pressure goal below 130/90, diabetes with hemoglobin A1c goal below 6.5% and cholesterol with LDL cholesterol (bad cholesterol) goal below 70 mg/dL. I also advised the patient to eat a healthy diet with plenty of whole grains, cereals, fruits and vegetables, exercise regularly and maintain ideal body weight.  Follow up in 3 months or call earlier if needed  Greater than 50% of time during this 25 minute non-face-to-face visit was spent on counseling,explanation of diagnosis, planning of further management, discussion with patient and family and coordination of care  Venancio Poisson, [facility]  [facility] [address] [place], [place] [address]  Phone [phone] Fax [phone] Note: This document was prepared with digital dictation and possible smart phrase technology. Any transcriptional errors that result from this process are unintentional.

## 2019-02-16 ENCOUNTER — Encounter: Payer: Self-pay | Admitting: Adult Health

## 2019-02-16 ENCOUNTER — Other Ambulatory Visit: Payer: Self-pay

## 2019-02-16 ENCOUNTER — Ambulatory Visit (INDEPENDENT_AMBULATORY_CARE_PROVIDER_SITE_OTHER): Payer: PPO | Admitting: Adult Health

## 2019-02-16 DIAGNOSIS — Z8673 Personal history of transient ischemic attack (TIA), and cerebral infarction without residual deficits: Secondary | ICD-10-CM

## 2019-02-16 DIAGNOSIS — G8194 Hemiplegia, unspecified affecting left nondominant side: Secondary | ICD-10-CM | POA: Diagnosis not present

## 2019-02-16 DIAGNOSIS — R5381 Other malaise: Secondary | ICD-10-CM | POA: Diagnosis not present

## 2019-02-16 DIAGNOSIS — I10 Essential (primary) hypertension: Secondary | ICD-10-CM | POA: Diagnosis not present

## 2019-02-16 DIAGNOSIS — I480 Paroxysmal atrial fibrillation: Secondary | ICD-10-CM

## 2019-02-16 DIAGNOSIS — E785 Hyperlipidemia, unspecified: Secondary | ICD-10-CM | POA: Diagnosis not present

## 2019-02-17 NOTE — Progress Notes (Signed)
I agree with the above plan 

## 2019-02-18 DIAGNOSIS — F329 Major depressive disorder, single episode, unspecified: Secondary | ICD-10-CM | POA: Diagnosis not present

## 2019-02-18 DIAGNOSIS — M6281 Muscle weakness (generalized): Secondary | ICD-10-CM | POA: Diagnosis not present

## 2019-02-18 DIAGNOSIS — R269 Unspecified abnormalities of gait and mobility: Secondary | ICD-10-CM | POA: Diagnosis not present

## 2019-02-18 DIAGNOSIS — M5136 Other intervertebral disc degeneration, lumbar region: Secondary | ICD-10-CM | POA: Diagnosis not present

## 2019-02-18 DIAGNOSIS — I10 Essential (primary) hypertension: Secondary | ICD-10-CM | POA: Diagnosis not present

## 2019-02-18 DIAGNOSIS — R296 Repeated falls: Secondary | ICD-10-CM | POA: Diagnosis not present

## 2019-02-18 DIAGNOSIS — M5416 Radiculopathy, lumbar region: Secondary | ICD-10-CM | POA: Diagnosis not present

## 2019-02-18 DIAGNOSIS — N189 Chronic kidney disease, unspecified: Secondary | ICD-10-CM | POA: Diagnosis not present

## 2019-02-18 DIAGNOSIS — I5032 Chronic diastolic (congestive) heart failure: Secondary | ICD-10-CM | POA: Diagnosis not present

## 2019-02-26 DIAGNOSIS — E440 Moderate protein-calorie malnutrition: Secondary | ICD-10-CM | POA: Diagnosis not present

## 2019-02-26 DIAGNOSIS — I480 Paroxysmal atrial fibrillation: Secondary | ICD-10-CM | POA: Diagnosis not present

## 2019-02-26 DIAGNOSIS — I5032 Chronic diastolic (congestive) heart failure: Secondary | ICD-10-CM | POA: Diagnosis not present

## 2019-02-26 DIAGNOSIS — L308 Other specified dermatitis: Secondary | ICD-10-CM | POA: Diagnosis not present

## 2019-02-26 DIAGNOSIS — I110 Hypertensive heart disease with heart failure: Secondary | ICD-10-CM | POA: Diagnosis not present

## 2019-02-27 DIAGNOSIS — M6281 Muscle weakness (generalized): Secondary | ICD-10-CM | POA: Diagnosis not present

## 2019-02-27 DIAGNOSIS — I69354 Hemiplegia and hemiparesis following cerebral infarction affecting left non-dominant side: Secondary | ICD-10-CM | POA: Diagnosis not present

## 2019-02-27 DIAGNOSIS — R2689 Other abnormalities of gait and mobility: Secondary | ICD-10-CM | POA: Diagnosis not present

## 2019-03-06 ENCOUNTER — Other Ambulatory Visit: Payer: Self-pay | Admitting: *Deleted

## 2019-03-06 NOTE — Patient Outreach (Signed)
[facility]) Care Management  03/06/2019  Punam Broussard 1941/03/11 [id number]   HTA HRA Telephone Screen     Outreach Attempt:  Successful telephone outreach to patient in the month of April to complete Health Risk Assessment Screening.  HIPAA verified with patient.  Spoke with patient and husband.  They are reporting multiple falls at home for the patient (8 in the last year).  Last one about a month ago.  History of stroke, atrial fibrillation, hypertension, and congestive heart failure.  Husband is the patients care giver.  Weighs herself every other day.  Does not monitor blood pressure at home, but does monitor pulse and pulse oximetry.  Husband reporting patient was receiving home health physical therapy  with [facility] but last day was 01/09/2019 due to insurance denial.  Husband very upset and would like to continue home health.  Encouraged patient and husband to speak with insurance company.  [facility] services reviewed and discussed with patient and husband.  They both decline services at this time.   Plan:  Patient will be placed in a [place] with 3 month follow up.  RN Health Coach provided patient and husband with numbers to HTA Concierge and HTA Compliance Departments.  RN Health Coach sent Complaint Form to [place].  RN Health Coach will send [facility] Welcome Letter.  RN Health Coach will make next telephone outreach to patient for follow up in the month of July.  [place] [phone] [email or web address]

## 2019-03-07 DIAGNOSIS — F329 Major depressive disorder, single episode, unspecified: Secondary | ICD-10-CM | POA: Diagnosis not present

## 2019-03-07 DIAGNOSIS — R296 Repeated falls: Secondary | ICD-10-CM | POA: Diagnosis not present

## 2019-03-07 DIAGNOSIS — M6281 Muscle weakness (generalized): Secondary | ICD-10-CM | POA: Diagnosis not present

## 2019-03-07 DIAGNOSIS — I5032 Chronic diastolic (congestive) heart failure: Secondary | ICD-10-CM | POA: Diagnosis not present

## 2019-03-07 DIAGNOSIS — M5136 Other intervertebral disc degeneration, lumbar region: Secondary | ICD-10-CM | POA: Diagnosis not present

## 2019-03-07 DIAGNOSIS — M5416 Radiculopathy, lumbar region: Secondary | ICD-10-CM | POA: Diagnosis not present

## 2019-03-07 DIAGNOSIS — R269 Unspecified abnormalities of gait and mobility: Secondary | ICD-10-CM | POA: Diagnosis not present

## 2019-03-07 DIAGNOSIS — N189 Chronic kidney disease, unspecified: Secondary | ICD-10-CM | POA: Diagnosis not present

## 2019-03-07 DIAGNOSIS — I10 Essential (primary) hypertension: Secondary | ICD-10-CM | POA: Diagnosis not present

## 2019-03-14 DIAGNOSIS — M6281 Muscle weakness (generalized): Secondary | ICD-10-CM | POA: Diagnosis not present

## 2019-03-14 DIAGNOSIS — R296 Repeated falls: Secondary | ICD-10-CM | POA: Diagnosis not present

## 2019-03-14 DIAGNOSIS — R269 Unspecified abnormalities of gait and mobility: Secondary | ICD-10-CM | POA: Diagnosis not present

## 2019-03-14 DIAGNOSIS — I10 Essential (primary) hypertension: Secondary | ICD-10-CM | POA: Diagnosis not present

## 2019-03-14 DIAGNOSIS — F329 Major depressive disorder, single episode, unspecified: Secondary | ICD-10-CM | POA: Diagnosis not present

## 2019-03-14 DIAGNOSIS — M5416 Radiculopathy, lumbar region: Secondary | ICD-10-CM | POA: Diagnosis not present

## 2019-03-14 DIAGNOSIS — M5136 Other intervertebral disc degeneration, lumbar region: Secondary | ICD-10-CM | POA: Diagnosis not present

## 2019-03-14 DIAGNOSIS — I5032 Chronic diastolic (congestive) heart failure: Secondary | ICD-10-CM | POA: Diagnosis not present

## 2019-03-14 DIAGNOSIS — N189 Chronic kidney disease, unspecified: Secondary | ICD-10-CM | POA: Diagnosis not present

## 2019-03-25 ENCOUNTER — Telehealth (HOSPITAL_COMMUNITY): Payer: Self-pay | Admitting: Radiology

## 2019-03-25 NOTE — Telephone Encounter (Signed)
Left message to call office-Patient needs to schedule an echocardiogram.  

## 2019-03-30 DIAGNOSIS — M25571 Pain in right ankle and joints of right foot: Secondary | ICD-10-CM | POA: Diagnosis not present

## 2019-03-30 DIAGNOSIS — I130 Hypertensive heart and chronic kidney disease with heart failure and stage 1 through stage 4 chronic kidney disease, or unspecified chronic kidney disease: Secondary | ICD-10-CM | POA: Diagnosis not present

## 2019-03-30 DIAGNOSIS — N183 Chronic kidney disease, stage 3 (moderate): Secondary | ICD-10-CM | POA: Diagnosis not present

## 2019-03-30 DIAGNOSIS — M19071 Primary osteoarthritis, right ankle and foot: Secondary | ICD-10-CM | POA: Diagnosis not present

## 2019-03-30 DIAGNOSIS — I5032 Chronic diastolic (congestive) heart failure: Secondary | ICD-10-CM | POA: Diagnosis not present

## 2019-04-07 ENCOUNTER — Other Ambulatory Visit: Payer: Self-pay | Admitting: *Deleted

## 2019-04-07 NOTE — Patient Outreach (Signed)
[facility]) Care Management  04/07/2019  Lucyle Alumbaugh [birth year] [id number]   Case Closure/Transition to [facility] Health/CCI   Outreach:  Patient case has been transitioned to [facility] Health/CCI for further Care Management assistance.  Plan: RN Health Coach will close case at this time. RN Health Coach will send primary care provider Care Management Case Closure Letter. RN Health Coach will make patient inactive with [facility] Care Management at this time.  [place] [phone] [email or web address]

## 2019-04-11 DIAGNOSIS — N189 Chronic kidney disease, unspecified: Secondary | ICD-10-CM | POA: Diagnosis not present

## 2019-04-11 DIAGNOSIS — I5032 Chronic diastolic (congestive) heart failure: Secondary | ICD-10-CM | POA: Diagnosis not present

## 2019-04-11 DIAGNOSIS — F329 Major depressive disorder, single episode, unspecified: Secondary | ICD-10-CM | POA: Diagnosis not present

## 2019-04-11 DIAGNOSIS — M5416 Radiculopathy, lumbar region: Secondary | ICD-10-CM | POA: Diagnosis not present

## 2019-04-11 DIAGNOSIS — M5136 Other intervertebral disc degeneration, lumbar region: Secondary | ICD-10-CM | POA: Diagnosis not present

## 2019-04-11 DIAGNOSIS — R269 Unspecified abnormalities of gait and mobility: Secondary | ICD-10-CM | POA: Diagnosis not present

## 2019-04-11 DIAGNOSIS — M6281 Muscle weakness (generalized): Secondary | ICD-10-CM | POA: Diagnosis not present

## 2019-04-11 DIAGNOSIS — I10 Essential (primary) hypertension: Secondary | ICD-10-CM | POA: Diagnosis not present

## 2019-04-11 DIAGNOSIS — R296 Repeated falls: Secondary | ICD-10-CM | POA: Diagnosis not present

## 2019-04-25 DIAGNOSIS — R269 Unspecified abnormalities of gait and mobility: Secondary | ICD-10-CM | POA: Diagnosis not present

## 2019-04-25 DIAGNOSIS — I5032 Chronic diastolic (congestive) heart failure: Secondary | ICD-10-CM | POA: Diagnosis not present

## 2019-04-25 DIAGNOSIS — F329 Major depressive disorder, single episode, unspecified: Secondary | ICD-10-CM | POA: Diagnosis not present

## 2019-04-25 DIAGNOSIS — M5416 Radiculopathy, lumbar region: Secondary | ICD-10-CM | POA: Diagnosis not present

## 2019-04-25 DIAGNOSIS — R296 Repeated falls: Secondary | ICD-10-CM | POA: Diagnosis not present

## 2019-04-25 DIAGNOSIS — M6281 Muscle weakness (generalized): Secondary | ICD-10-CM | POA: Diagnosis not present

## 2019-04-25 DIAGNOSIS — I10 Essential (primary) hypertension: Secondary | ICD-10-CM | POA: Diagnosis not present

## 2019-04-25 DIAGNOSIS — M5136 Other intervertebral disc degeneration, lumbar region: Secondary | ICD-10-CM | POA: Diagnosis not present

## 2019-04-25 DIAGNOSIS — N189 Chronic kidney disease, unspecified: Secondary | ICD-10-CM | POA: Diagnosis not present

## 2019-04-29 ENCOUNTER — Ambulatory Visit: Payer: PPO | Admitting: *Deleted

## 2019-05-04 DIAGNOSIS — S8992XA Unspecified injury of left lower leg, initial encounter: Secondary | ICD-10-CM | POA: Diagnosis not present

## 2019-05-04 DIAGNOSIS — R1110 Vomiting, unspecified: Secondary | ICD-10-CM | POA: Diagnosis not present

## 2019-05-04 DIAGNOSIS — I959 Hypotension, unspecified: Secondary | ICD-10-CM | POA: Diagnosis not present

## 2019-05-04 DIAGNOSIS — M25462 Effusion, left knee: Secondary | ICD-10-CM | POA: Diagnosis not present

## 2019-05-04 DIAGNOSIS — Y998 Other external cause status: Secondary | ICD-10-CM | POA: Diagnosis not present

## 2019-05-04 DIAGNOSIS — W0110XA Fall on same level from slipping, tripping and stumbling with subsequent striking against unspecified object, initial encounter: Secondary | ICD-10-CM | POA: Diagnosis not present

## 2019-05-04 DIAGNOSIS — M11262 Other chondrocalcinosis, left knee: Secondary | ICD-10-CM | POA: Diagnosis not present

## 2019-05-04 DIAGNOSIS — M8588 Other specified disorders of bone density and structure, other site: Secondary | ICD-10-CM | POA: Diagnosis not present

## 2019-05-04 DIAGNOSIS — S0990XA Unspecified injury of head, initial encounter: Secondary | ICD-10-CM | POA: Diagnosis not present

## 2019-05-04 DIAGNOSIS — M25561 Pain in right knee: Secondary | ICD-10-CM | POA: Diagnosis not present

## 2019-05-04 DIAGNOSIS — M25562 Pain in left knee: Secondary | ICD-10-CM | POA: Diagnosis not present

## 2019-05-04 DIAGNOSIS — W1830XA Fall on same level, unspecified, initial encounter: Secondary | ICD-10-CM | POA: Diagnosis not present

## 2019-05-04 DIAGNOSIS — I672 Cerebral atherosclerosis: Secondary | ICD-10-CM | POA: Diagnosis not present

## 2019-05-04 DIAGNOSIS — Z5320 Procedure and treatment not carried out because of patient's decision for unspecified reasons: Secondary | ICD-10-CM | POA: Diagnosis not present

## 2019-05-04 DIAGNOSIS — R112 Nausea with vomiting, unspecified: Secondary | ICD-10-CM | POA: Diagnosis not present

## 2019-05-04 DIAGNOSIS — Z7901 Long term (current) use of anticoagulants: Secondary | ICD-10-CM | POA: Diagnosis not present

## 2019-05-09 DIAGNOSIS — R296 Repeated falls: Secondary | ICD-10-CM | POA: Diagnosis not present

## 2019-05-09 DIAGNOSIS — F329 Major depressive disorder, single episode, unspecified: Secondary | ICD-10-CM | POA: Diagnosis not present

## 2019-05-09 DIAGNOSIS — M5136 Other intervertebral disc degeneration, lumbar region: Secondary | ICD-10-CM | POA: Diagnosis not present

## 2019-05-09 DIAGNOSIS — M6281 Muscle weakness (generalized): Secondary | ICD-10-CM | POA: Diagnosis not present

## 2019-05-09 DIAGNOSIS — R269 Unspecified abnormalities of gait and mobility: Secondary | ICD-10-CM | POA: Diagnosis not present

## 2019-05-09 DIAGNOSIS — I5032 Chronic diastolic (congestive) heart failure: Secondary | ICD-10-CM | POA: Diagnosis not present

## 2019-05-09 DIAGNOSIS — M5416 Radiculopathy, lumbar region: Secondary | ICD-10-CM | POA: Diagnosis not present

## 2019-05-09 DIAGNOSIS — N189 Chronic kidney disease, unspecified: Secondary | ICD-10-CM | POA: Diagnosis not present

## 2019-05-09 DIAGNOSIS — I10 Essential (primary) hypertension: Secondary | ICD-10-CM | POA: Diagnosis not present

## 2019-05-13 DIAGNOSIS — I480 Paroxysmal atrial fibrillation: Secondary | ICD-10-CM | POA: Diagnosis not present

## 2019-05-13 DIAGNOSIS — I69354 Hemiplegia and hemiparesis following cerebral infarction affecting left non-dominant side: Secondary | ICD-10-CM | POA: Diagnosis not present

## 2019-05-13 DIAGNOSIS — I5042 Chronic combined systolic (congestive) and diastolic (congestive) heart failure: Secondary | ICD-10-CM | POA: Diagnosis not present

## 2019-05-13 DIAGNOSIS — Z7409 Other reduced mobility: Secondary | ICD-10-CM | POA: Diagnosis not present

## 2019-05-13 DIAGNOSIS — R29898 Other symptoms and signs involving the musculoskeletal system: Secondary | ICD-10-CM | POA: Diagnosis not present

## 2019-05-13 DIAGNOSIS — N183 Chronic kidney disease, stage 3 (moderate): Secondary | ICD-10-CM | POA: Diagnosis not present

## 2019-05-13 DIAGNOSIS — I129 Hypertensive chronic kidney disease with stage 1 through stage 4 chronic kidney disease, or unspecified chronic kidney disease: Secondary | ICD-10-CM | POA: Diagnosis not present

## 2019-05-13 DIAGNOSIS — R630 Anorexia: Secondary | ICD-10-CM | POA: Diagnosis not present

## 2019-05-23 DIAGNOSIS — R269 Unspecified abnormalities of gait and mobility: Secondary | ICD-10-CM | POA: Diagnosis not present

## 2019-05-23 DIAGNOSIS — R296 Repeated falls: Secondary | ICD-10-CM | POA: Diagnosis not present

## 2019-05-23 DIAGNOSIS — I5032 Chronic diastolic (congestive) heart failure: Secondary | ICD-10-CM | POA: Diagnosis not present

## 2019-05-23 DIAGNOSIS — N189 Chronic kidney disease, unspecified: Secondary | ICD-10-CM | POA: Diagnosis not present

## 2019-05-23 DIAGNOSIS — M5416 Radiculopathy, lumbar region: Secondary | ICD-10-CM | POA: Diagnosis not present

## 2019-05-23 DIAGNOSIS — M6281 Muscle weakness (generalized): Secondary | ICD-10-CM | POA: Diagnosis not present

## 2019-05-23 DIAGNOSIS — F329 Major depressive disorder, single episode, unspecified: Secondary | ICD-10-CM | POA: Diagnosis not present

## 2019-05-23 DIAGNOSIS — I10 Essential (primary) hypertension: Secondary | ICD-10-CM | POA: Diagnosis not present

## 2019-05-23 DIAGNOSIS — M5136 Other intervertebral disc degeneration, lumbar region: Secondary | ICD-10-CM | POA: Diagnosis not present

## 2019-05-30 DIAGNOSIS — M6281 Muscle weakness (generalized): Secondary | ICD-10-CM | POA: Diagnosis not present

## 2019-05-30 DIAGNOSIS — F329 Major depressive disorder, single episode, unspecified: Secondary | ICD-10-CM | POA: Diagnosis not present

## 2019-05-30 DIAGNOSIS — R296 Repeated falls: Secondary | ICD-10-CM | POA: Diagnosis not present

## 2019-05-30 DIAGNOSIS — M5416 Radiculopathy, lumbar region: Secondary | ICD-10-CM | POA: Diagnosis not present

## 2019-05-30 DIAGNOSIS — M5136 Other intervertebral disc degeneration, lumbar region: Secondary | ICD-10-CM | POA: Diagnosis not present

## 2019-05-30 DIAGNOSIS — N189 Chronic kidney disease, unspecified: Secondary | ICD-10-CM | POA: Diagnosis not present

## 2019-05-30 DIAGNOSIS — R269 Unspecified abnormalities of gait and mobility: Secondary | ICD-10-CM | POA: Diagnosis not present

## 2019-05-30 DIAGNOSIS — I5032 Chronic diastolic (congestive) heart failure: Secondary | ICD-10-CM | POA: Diagnosis not present

## 2019-05-30 DIAGNOSIS — I10 Essential (primary) hypertension: Secondary | ICD-10-CM | POA: Diagnosis not present

## 2019-05-30 NOTE — Progress Notes (Signed)
Cardiology Office Note Date:  06/01/2019  Patient ID:  Teresa York, Teresa York 1941-06-10, MRN [id number] PCP:  Patrecia Pour, Christean Grief, MD  Cardiologist: Dr. [name] Electrophysiologist:  Dr. Rayann Heman    Chief Complaint:  6 mo f/u  History of Present Illness: Teresa York is a 78 y.o. 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 Rayann Heman.  Last seen by him Feb 2020.  He makes mention of a prior ICH in her history (I am unclear when this was).  He discussed Eliquis currently was appropriately dosed, though close watch on Creat with prior hier results, and likely need to reduce dose at 78 y/o.  At that visit her amiodarone ewas reduced to 100mg  daily, labs done.  11/17/2018: Creat 1.04, LFTs wnl, H/H 12/40, TSH 4.900  She comes today accompanied by her husband.  He mentions at the get go he is concerned about her falls.  He reports she has fallen 8-10 times since her stroke feb 2019.  The most recent was 10-14 days ago falling on her hip suffering a large hematoma.   We discussed her falls at length.  Since her stroke she has remained with LLE deficit particularly and poor vision.  NONE of the falls are fainting spells, all are mechanical either getting tripped up on her own feet or tripping in general with some drag of her foot.  Her husband mentions that she has been started back with PT and OT though she does notod the "homework" in between and worries without her participation she will never have any further recovery.  Also mentions mostly her falls occur when she is try to walk without her walker or assistance.  She has lost 30lbs in the last year or so, this with general decrease in appetite and intake.  From a cardiac perspective, she denies any CP,  palpitations, no dizzy spells, no near syncope or syncope.  She generally sleeps poorly, but denies any symptoms of PND or orthopnea.  she has not had any symptoms of her AFib, no palpitations.  She denies any bleeding or signs of bleeding, outside of the hematoma at her hip from her last fall.  She has some noted subconjunctival blood L eye, this she woke with a couple days ago, non-painful ad no change in her vision with it.  (likely traumatic during sleep)  They tell me the hematoma on her hip is improving, discussed with her PMD and her therapist has been keeping an eye on it.  She did not stop/hold her Eliquis.  She denies any ongoing pain, and never had hip/orthopedic pain, difficulty with movement of her hip/leg afterwards.    AFib Hx DCCV, 11/02/16, 11/16/26, 01/02/17 H/o GIB on Xarelto Jan-Feb 2019 AAD:  2018, amiodarone (current)   Past Medical History:  Diagnosis Date  . Allergic rhinitis   . Anxiety   . Atrial fibrillation ([place])   . Barrett's esophagus   . Breast cancer ([place]) 2005   "left"  . Chronic lower back pain   . Chronic systolic CHF (congestive heart failure) (HCC)    a. EF 30-35% felt to be possibly be tachy mediated from afib wtih RVR  . Depression   . Diverticulosis   . GERD (gastroesophageal reflux disease)   . Hiatal hernia   .  Hypertension   . Hypothyroidism   . Left atrial enlargement   . Mitral regurgitation   . OAB (overactive bladder)   . Persistent atrial fibrillation 10/31/2016   a. s/p failed DCCV x3. Now on amiodarone.   . 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 Stroke [facility])     Past Surgical History:  Procedure Laterality Date  . BACK SURGERY    . BREAST BIOPSY Left 2005  . BREAST LUMPECTOMY Left 2005  . CARDIOVERSION N/A 11/02/2016   Procedure: CARDIOVERSION;  Surgeon: Thayer Headings, MD;  Location: [place];  Service: Cardiovascular;  Laterality: N/A;  . CARDIOVERSION N/A 11/16/2016   Procedure:  CARDIOVERSION;  Surgeon: Fay Records, MD;  Location: [place];  Service: Cardiovascular;  Laterality: N/A;  . CARDIOVERSION N/A 12/24/2016   Procedure: CARDIOVERSION;  Surgeon: Sanda Klein, MD;  Location: [facility];  Service: Cardiovascular;  Laterality: N/A;  . CARPAL TUNNEL RELEASE Bilateral 1995  . COLONOSCOPY    . ELBOW FRACTURE SURGERY Right 2009   with implant  . ESOPHAGOGASTRODUODENOSCOPY    . ESOPHAGUS SURGERY     "in the process of getting cancerous cell off my esophagus" (10/31/2016)  . FRACTURE SURGERY    . IR CT HEAD LTD  11/14/2017  . IR PERCUTANEOUS ART THROMBECTOMY/INFUSION INTRACRANIAL INC DIAG ANGIO  11/14/2017  . LUMBAR DISC SURGERY     L5  . MOHS SURGERY     "corner of right eye; on left cheek"  . RADIOLOGY WITH ANESTHESIA N/A 11/13/2017   Procedure: IR WITH ANESTHESIA;  Surgeon: Radiologist, Medication, MD;  Location: [place];  Service: Radiology;  Laterality: N/A;  . TEE WITHOUT CARDIOVERSION N/A 11/16/2016   Procedure: TRANSESOPHAGEAL ECHOCARDIOGRAM (TEE);  Surgeon: Fay Records, MD;  Location: [place];  Service: Cardiovascular;  Laterality: N/A;  . TONSILLECTOMY    . TUBAL LIGATION  1980    Current Outpatient Medications  Medication Sig Dispense Refill  . acetaminophen (TYLENOL) 325 MG tablet Take 650 mg by mouth 2 (two) times daily as needed for mild pain.     Marland Kitchen amiodarone (PACERONE) 200 MG tablet Take 0.5 tablets (100 mg total) by mouth daily. 45 tablet 3  . apixaban (ELIQUIS) 5 MG TABS tablet Take 1 tablet (5 mg total) by mouth 2 (two) times daily. 180 tablet 3  . cetirizine (ZYRTEC) 10 MG tablet Take 10 mg by mouth daily. Reported on 10/27/2015    . Cholecalciferol (D 1000) 1000 units capsule Take 1,000 Units by mouth daily. Reported on 11/03/2015    . FLUoxetine (PROZAC) 40 MG capsule Take 40 mg by mouth daily.    . fluticasone (FLONASE) 50 MCG/ACT nasal spray Place 1 spray into both nostrils daily as needed for allergies.     Marland Kitchen levothyroxine  (SYNTHROID, LEVOTHROID) 125 MCG tablet Take 125 mcg by mouth daily before breakfast.    . Magnesium Oxide (MAGOX 400 PO) Take 400 mg by mouth daily.     . meclizine (ANTIVERT) 25 MG tablet Take 25 mg by mouth 3 (three) times daily as needed for dizziness or nausea.     . mirtazapine (REMERON) 15 MG tablet Take 7.5 mg by mouth 2 (two) times daily.     . Multiple Vitamins-Minerals (ICAPS) CAPS Take 1 capsule by mouth daily.    Marland Kitchen omeprazole (PRILOSEC) 40 MG capsule Take 40 mg by mouth 2 (two) times daily.    . ondansetron (ZOFRAN) 4 MG tablet Take 4 mg by  mouth every 8 (eight) hours as needed for nausea or vomiting.   1  . Probiotic Product (DAILY PROBIOTIC) CAPS Take 1 tablet by mouth daily.    . traZODone (DESYREL) 150 MG tablet Take 75 mg by mouth at bedtime.  0   No current facility-administered medications for this visit.     Allergies:   Benazepril and Diltiazem hcl   Social History:  The patient  reports that she has never smoked. She has never used smokeless tobacco. She reports current alcohol use. She reports that she does not use drugs.   Family History:  The patient's family history includes Heart attack (age of onset: 59) in her father.  ROS:  Please see the history of present illness.    All other systems are reviewed and otherwise negative.   PHYSICAL EXAM:  VS:  BP (!) 154/62   Pulse 68   Ht 5' 2.5" (1.588 m)   Wt 130 lb (59 kg)   LMP  (LMP Unknown)   BMI 23.40 kg/m  BMI: Body mass index is 23.4 kg/m. Well nourished, well developed, in no acute distress  HEENT: normocephalic, L eye has medial conjunctival hemorrhage, no decrease in ROM, normal pupil, reports non-painful  Neck: no JVD, carotid bruits or masses Cardiac:  RRR; no significant murmurs, no rubs, or gallops Lungs:  CTA b/l, no wheezing, rhonchi or rales  Abd: soft, nontender MS:  age appropriate atrophy, she has a hemasoma to R hip, it is about the size of a racquet ball.  It is nonpainful, semi-firm,  ecchymosis is yellowish green. Ext: no edema  Skin: warm and dry, no rash Neuro:  No gross deficits appreciated Psych: euthymic mood, full affect    EKG:  Not done today  11/15/17: TTE Study Conclusions - Left ventricle: The cavity size was normal. Wall thickness was   increased in a pattern of mild LVH. Systolic function was normal.   The estimated ejection fraction was in the range of 60% to 65%.   Wall motion was normal; there were no regional wall motion   abnormalities. Doppler parameters are consistent with high   ventricular filling pressure. - Aortic valve: Valve mobility was restricted. There was moderate   regurgitation. - Mitral valve: Calcified annulus. There was moderate   regurgitation. - Left atrium: The atrium was moderately dilated. Volume/bsa, ES   (1-plane Simpson&'s, A4C): 47.4 ml/m^2. - Right atrium: The atrium was mildly dilated. - Tricuspid valve: There was mild regurgitation. - Pulmonary arteries: Systolic pressure was moderately increased.   PA peak pressure: 53 mm Hg (S).  Impressions: - No cardiac source of emboli was indentified.  Recent Labs: 11/17/2018: ALT 27; BUN 14; Creatinine, Ser 1.04; Hemoglobin 12.9; Platelets 241; Potassium 3.5; Sodium 142; TSH 4.900  No results found for requested labs within last 8760 hours.   CrCl cannot be calculated (Patient's most recent lab result is older than the maximum 21 days allowed.).   Wt Readings from Last 3 Encounters:  06/01/19 130 lb (59 kg)  11/17/18 139 lb 12.8 oz (63.4 kg)  08/03/18 150 lb (68 kg)     Other studies reviewed: Additional studies/records reviewed today include: summarized above  ASSESSMENT AND PLAN:  1. Persistent      CHA2DS2Vasc is 4, on Eliquis, appropriately dosed     Will updat labs today for her eliquis and amo     No interval AF by symptoms      Exam today with RRR  We had a  long discussion regarding falls and anticoagulation.  Her husband is very forthcoming in that  the patient putting minimal effort into her therapy however.  That being said, also it seems her falls occur when trying to ambulate independently.  We discussed safety, the importance that regardless of the amount of functional recovery that it is imperative that she use her walker/abulation aids or have assistance to avoid falls.  To avoid trip hazards in the home.  I discussed concerns of her a/c though she clearly has high stroke risk as well.  They report her hip hematoma as steadily getting smaller, no longer painful.  Discussed to monitor closely for any signs of worsening, or infection.  She has PT coming 2x week, they report he is as well a retired MD(?) and has been keeping an eye on it as well.  2. NICM with recovered LVEF 3. Chronic CHF (systolic >> diastolic)     No symptoms or exam findings to suggest fluid OL  3. HTN     A little high today, they report at home routinely 120's/70's    Disposition: F/u with EP is 67mo, sooner if needed.  She reports she is due to see Dr. [name].  They are asked to let us know if she continues to have falls despite efforts to reduce them with compliance in ambulation assist etc.  If they continue we need to consider stopping her [place].    Current medicines are reviewed at length with the patient today.  The patient did not have any concerns regarding medicines.  Venetia Night, PA-C 06/01/2019 11:54 AM     [address] [address] [phone] (office)  [phone] (fax)

## 2019-06-01 ENCOUNTER — Ambulatory Visit: Payer: PPO | Admitting: Physician Assistant

## 2019-06-01 ENCOUNTER — Encounter (INDEPENDENT_AMBULATORY_CARE_PROVIDER_SITE_OTHER): Payer: Self-pay

## 2019-06-01 ENCOUNTER — Encounter: Payer: Self-pay | Admitting: Physician Assistant

## 2019-06-01 ENCOUNTER — Other Ambulatory Visit: Payer: Self-pay

## 2019-06-01 VITALS — BP 154/62 | HR 68 | Ht 62.5 in | Wt 130.0 lb

## 2019-06-01 DIAGNOSIS — R296 Repeated falls: Secondary | ICD-10-CM

## 2019-06-01 DIAGNOSIS — I428 Other cardiomyopathies: Secondary | ICD-10-CM | POA: Diagnosis not present

## 2019-06-01 DIAGNOSIS — I5032 Chronic diastolic (congestive) heart failure: Secondary | ICD-10-CM | POA: Diagnosis not present

## 2019-06-01 DIAGNOSIS — I4819 Other persistent atrial fibrillation: Secondary | ICD-10-CM

## 2019-06-01 DIAGNOSIS — I10 Essential (primary) hypertension: Secondary | ICD-10-CM

## 2019-06-01 NOTE — Patient Instructions (Signed)
Medication Instructions:  Your physician recommends that you continue on your current medications as directed. Please refer to the Current Medication list given to you today.  If you need a refill on your cardiac medications before your next appointment, please call your pharmacy.   Lab work:  BMET CBC TSH AND LFT   If you have labs (blood work) drawn today and your tests are completely normal, you will receive your results only by: Marland Kitchen MyChart Message (if you have MyChart) OR . A paper copy in the mail If you have any lab test that is abnormal or we need to change your treatment, we will call you to review the results.  Testing/Procedures: NONE ORDERED  TODAY  Follow-Up: At [facility], you and your health needs are our priority.  As part of our continuing mission to provide you with exceptional heart care, we have created designated Provider Care Teams.  These Care Teams include your primary Cardiologist (physician) and Advanced Practice Providers (APPs -  Physician Assistants and Nurse Practitioners) who all work together to provide you with the care you need, when you need it. You will need a follow up appointment in 6 months. Please call our office 2 months in advance to schedule this appointment.  You may see Dr. Rayann Heman or one of the following Advanced Practice Providers on your designated Care Team:   Chanetta Marshall, NP . Tommye Standard, PA-C . Joesph July PA-C   Any Other Special Instructions Will Be Listed Below (If Applicable).  PLEASE DON'T WALK WITHOUT  A WALKER OR ASSISTANCE  CALL CLINIC IF FALLING  FREQUENTLY   CONTINUE TO MONITOR HEMATOMA AND SWELLING  ON ON RIGHT SIDE HIP

## 2019-06-02 LAB — BASIC METABOLIC PANEL
BUN/Creatinine Ratio: 11 — ABNORMAL LOW (ref 12–28)
BUN: 12 mg/dL (ref 8–27)
CO2: 24 mmol/L (ref 20–29)
Calcium: 9.3 mg/dL (ref 8.7–10.3)
Chloride: 100 mmol/L (ref 96–106)
Creatinine, Ser: 1.06 mg/dL — ABNORMAL HIGH (ref 0.57–1.00)
GFR calc Af Amer: 58 mL/min/{1.73_m2} — ABNORMAL LOW (ref >59)
GFR calc non Af Amer: 50 mL/min/{1.73_m2} — ABNORMAL LOW (ref >59)
Glucose: 102 mg/dL — ABNORMAL HIGH (ref 65–99)
Potassium: 3.6 mmol/L (ref 3.5–5.2)
Sodium: 137 mmol/L (ref 134–144)

## 2019-06-02 LAB — CBC
Hematocrit: 31 % — ABNORMAL LOW (ref 34.0–46.6)
Hemoglobin: 9.6 g/dL — ABNORMAL LOW (ref 11.1–15.9)
MCH: 25.9 pg — ABNORMAL LOW (ref 26.6–33.0)
MCHC: 31 g/dL — ABNORMAL LOW (ref 31.5–35.7)
MCV: 84 fL (ref 79–97)
Platelets: 299 10*3/uL (ref 150–450)
RBC: 3.7 x10E6/uL — ABNORMAL LOW (ref 3.77–5.28)
RDW: 13.8 % (ref 11.7–15.4)
WBC: 6.1 10*3/uL (ref 3.4–10.8)

## 2019-06-02 LAB — HEPATIC FUNCTION PANEL
ALT: 20 IU/L (ref 0–32)
AST: 44 IU/L — ABNORMAL HIGH (ref 0–40)
Albumin: 3.6 g/dL — ABNORMAL LOW (ref 3.7–4.7)
Alkaline Phosphatase: 97 IU/L (ref 39–117)
Bilirubin Total: 0.5 mg/dL (ref 0.0–1.2)
Bilirubin, Direct: 0.17 mg/dL (ref 0.00–0.40)
Total Protein: 6.9 g/dL (ref 6.0–8.5)

## 2019-06-02 LAB — TSH: TSH: 0.701 u[IU]/mL (ref 0.450–4.500)

## 2019-06-04 DIAGNOSIS — H3530 Unspecified macular degeneration: Secondary | ICD-10-CM | POA: Diagnosis not present

## 2019-06-04 DIAGNOSIS — H401221 Low-tension glaucoma, left eye, mild stage: Secondary | ICD-10-CM | POA: Diagnosis not present

## 2019-06-04 DIAGNOSIS — H269 Unspecified cataract: Secondary | ICD-10-CM | POA: Diagnosis not present

## 2019-06-04 DIAGNOSIS — H401213 Low-tension glaucoma, right eye, severe stage: Secondary | ICD-10-CM | POA: Diagnosis not present

## 2019-06-04 DIAGNOSIS — Z79899 Other long term (current) drug therapy: Secondary | ICD-10-CM | POA: Diagnosis not present

## 2019-06-04 DIAGNOSIS — H2513 Age-related nuclear cataract, bilateral: Secondary | ICD-10-CM | POA: Diagnosis not present

## 2019-06-06 DIAGNOSIS — M5416 Radiculopathy, lumbar region: Secondary | ICD-10-CM | POA: Diagnosis not present

## 2019-06-06 DIAGNOSIS — N189 Chronic kidney disease, unspecified: Secondary | ICD-10-CM | POA: Diagnosis not present

## 2019-06-06 DIAGNOSIS — M5136 Other intervertebral disc degeneration, lumbar region: Secondary | ICD-10-CM | POA: Diagnosis not present

## 2019-06-06 DIAGNOSIS — I10 Essential (primary) hypertension: Secondary | ICD-10-CM | POA: Diagnosis not present

## 2019-06-06 DIAGNOSIS — F329 Major depressive disorder, single episode, unspecified: Secondary | ICD-10-CM | POA: Diagnosis not present

## 2019-06-06 DIAGNOSIS — R296 Repeated falls: Secondary | ICD-10-CM | POA: Diagnosis not present

## 2019-06-06 DIAGNOSIS — I5032 Chronic diastolic (congestive) heart failure: Secondary | ICD-10-CM | POA: Diagnosis not present

## 2019-06-06 DIAGNOSIS — R269 Unspecified abnormalities of gait and mobility: Secondary | ICD-10-CM | POA: Diagnosis not present

## 2019-06-06 DIAGNOSIS — M6281 Muscle weakness (generalized): Secondary | ICD-10-CM | POA: Diagnosis not present

## 2019-06-08 ENCOUNTER — Telehealth: Payer: Self-pay | Admitting: Physician Assistant

## 2019-06-08 ENCOUNTER — Telehealth: Payer: Self-pay

## 2019-06-08 NOTE — Telephone Encounter (Signed)
NOTES [place] [phone]

## 2019-06-08 NOTE — Telephone Encounter (Signed)
Called to follow up on labs..  She is doing well, no bleeding or signs of bleeding Hematoma to R hip about the same, definitely not worse, no pain.   NO bleeding or signs of bleeding, no more falls  She reports her GB surgery was late last year, but knows her PMD has labs from the last couple months at least Her HGB in all but the [place] 2020 look about the same, suspect Feb as the outlyer, but will continue to try and request her labs from her PMD.  She is reminded to keep in communication with her PMD with the area of hematoma and of it does not continue to improve this week to see him for re-evaluation.  She stated understanding  Tommye Standard, PA-C

## 2019-06-11 ENCOUNTER — Telehealth: Payer: Self-pay | Admitting: Adult Health

## 2019-06-11 NOTE — Telephone Encounter (Signed)
I called husband.  Pt has had falls (10-12 since last seen).  PCP aware.  He has multiple concerns falls, incontinence (both bladder and bowel,  He vision (macular degeneration), speech is still struggle.  (Did have PT , but pt does not want to follow thru with home exercises).  I relayed that did not have 3 months RV, so was able to make appt 06-16-19 at 1045, be here 1015. Will see them then.  He was appreciative of call.

## 2019-06-11 NOTE — Telephone Encounter (Signed)
Pt husband called stating that the pt has gotten wrose. She is having speech,hearing, sight and balance problems and she is also having  urinary incontinence and he would like to discuss this with the RN. Please advise.

## 2019-06-12 DIAGNOSIS — H35433 Paving stone degeneration of retina, bilateral: Secondary | ICD-10-CM | POA: Diagnosis not present

## 2019-06-12 DIAGNOSIS — H353123 Nonexudative age-related macular degeneration, left eye, advanced atrophic without subfoveal involvement: Secondary | ICD-10-CM | POA: Diagnosis not present

## 2019-06-12 DIAGNOSIS — H353213 Exudative age-related macular degeneration, right eye, with inactive scar: Secondary | ICD-10-CM | POA: Diagnosis not present

## 2019-06-12 DIAGNOSIS — H43813 Vitreous degeneration, bilateral: Secondary | ICD-10-CM | POA: Diagnosis not present

## 2019-06-16 ENCOUNTER — Other Ambulatory Visit: Payer: Self-pay

## 2019-06-16 ENCOUNTER — Encounter: Payer: Self-pay | Admitting: Adult Health

## 2019-06-16 ENCOUNTER — Ambulatory Visit: Payer: PPO | Admitting: Adult Health

## 2019-06-16 VITALS — BP 157/65 | HR 66 | Temp 98.4°F | Ht 62.5 in | Wt 134.6 lb

## 2019-06-16 DIAGNOSIS — I480 Paroxysmal atrial fibrillation: Secondary | ICD-10-CM

## 2019-06-16 DIAGNOSIS — Z8673 Personal history of transient ischemic attack (TIA), and cerebral infarction without residual deficits: Secondary | ICD-10-CM | POA: Diagnosis not present

## 2019-06-16 DIAGNOSIS — R5381 Other malaise: Secondary | ICD-10-CM | POA: Diagnosis not present

## 2019-06-16 DIAGNOSIS — R4189 Other symptoms and signs involving cognitive functions and awareness: Secondary | ICD-10-CM | POA: Diagnosis not present

## 2019-06-16 DIAGNOSIS — I10 Essential (primary) hypertension: Secondary | ICD-10-CM | POA: Diagnosis not present

## 2019-06-16 DIAGNOSIS — E785 Hyperlipidemia, unspecified: Secondary | ICD-10-CM | POA: Diagnosis not present

## 2019-06-16 NOTE — Patient Instructions (Signed)
We will obtain lab work today due to concerns of memory loss along with being scheduled for an EEG to look for any type of abnormality that could be causing worsening of memory.  It would also be recommended for you to schedule follow-up visit with urology in regards to ongoing incontinence concerns.  Would recommend restarting physical therapy for ongoing balance difficulties.  Will discuss with Dr. Leonie Man current concerns and further recommendations  Continue Eliquis (apixaban) daily  and lipitor  for secondary stroke prevention  Continue to follow up with PCP regarding cholesterol and blood pressure management   Continue to monitor blood pressure at home  Maintain strict control of hypertension with blood pressure goal below 130/90, diabetes with hemoglobin A1c goal below 6.5% and cholesterol with LDL cholesterol (bad cholesterol) goal below 70 mg/dL. I also advised the patient to eat a healthy diet with plenty of whole grains, cereals, fruits and vegetables, exercise regularly and maintain ideal body weight.         Thank you for coming to see [place] at [facility]. I hope we have been able to provide you high quality care today.  You may receive a patient satisfaction survey over the next few weeks. We would appreciate your feedback and comments so that we may continue to improve ourselves and the health of our patients.

## 2019-06-16 NOTE — Progress Notes (Signed)
[facility] [address] [place]. [place] [address] [phone]       FOLLOW-UP NOTE  Teresa. Teresa York Date of Birth:  12-16-1940 Medical Record Number:  [id number]   Reason for visit: Right MCA infarct   Chief Complaint  Patient presents with   Follow-up    3 mon f/u. Husband present. Treatment room. Patient's husband mentioned that she has been having issues with her incontinence and eye sight.  Over the last 3 weeks she has fallen twice.      HPI:  Update 06/16/2019: Teresa York is being seen today for stroke follow-up.  Residual deficits of left lower extremity weakness with ongoing falls at home. Physical therapy just ended last week the patient has been maintaining HEP. AFO brace used for long distance or PT. Falls typically occur when she is ambulating without assistive device or making turns.  She remains on Eliquis for persistent atrial fibrillation and secondary stroke prevention.  Continue atorvastatin without myalgias.  Blood pressure today 157/65 - monitors at home and typically 120-130s/60s. Husband has concerns regarding incontinence, blurred vision, speech difficulty, cognition and hearing - believes worsened.  He states all symptoms started with her stroke and have been slowly worsening.  She continues to be followed by ophthalmology where she was seen by a glaucoma specialist, macular degeneration specialist and currently awaiting evaluation by low vision specialist.  As discussed at prior visit, ophthalmology does not believe visual concerns are secondary from stroke.  Husband also states she has been needing more assistance at home with ADLs, greater difficulty performing basic tasks, slower processing and worsening of short-term memory.  They may participate in additional home health therapy in the next couple weeks.      Virtual visit 02/16/2019: Teresa York is a 78 year pleasant Caucasian lady with underlying medical history of atrial fibrillation,  chronic low back pain, CHF, HTN, OSA on CPAP, and right MCA infarct in 10/2017 after presenting with left-sided weakness, right gaze preference, dysarthria and left-sided facial droop. She has been stable from a stroke standpoint with residual deficits of left hemiparesis with reported worsening likely due to deconditioning.  She does report multiple falls since prior visit and did have a short stay at [facility] for rehabilitation.  Attempted to receive additional home therapy with only one visit approved by insurance.  Limited functioning during the day due to difficulty ambulating with underlying CHF, lower extremity edema and left-sided weakness per patient.  She continues to use rolling walker at all times.  Her husband, she will be fitted for a brace of left ankle due to dorsiflexion weakness and dragging of left foot.  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  Continues on Eliquis without side effects of bleeding or bruising along with atorvastatin without side effects myalgias.  Blood pressure stable.  Ongoing compliance with CPAP for OSA management.  No further concerns at this time.      ROS:   14 system review of systems is positive for dizziness, speech difficulty, weakness and all others negative  PMH:  Past Medical History:  Diagnosis Date   Allergic rhinitis    Anxiety    Atrial fibrillation (HCC)    Barrett's esophagus    Breast cancer ([place]) 2005   "left"   Chronic lower back pain    Chronic systolic CHF (congestive heart failure) (HCC)    a. EF 30-35% felt to be possibly be tachy mediated  from afib wtih RVR   Depression    Diverticulosis    GERD (gastroesophageal reflux disease)    Hiatal hernia    Hypertension    Hypothyroidism    Left atrial enlargement    Mitral regurgitation    OAB (overactive bladder)    Persistent atrial fibrillation 10/31/2016   a. s/p failed DCCV x3.  Now on amiodarone.    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)   Stroke [facility])     Social History:  Social History   Socioeconomic History   Marital status: Married    Spouse name: Not on file   Number of children: 2   Years of education: Not on file   Highest education level: Not on file  Occupational History   Occupation: Retired    Fish farm manager: RETIRED  Scientist, product/process development strain: Not on file   Food insecurity    Worry: Not on file    Inability: Not on file   Transportation needs    Medical: Not on file    Non-medical: Not on file  Tobacco Use   Smoking status: Never Smoker   Smokeless tobacco: Never Used  Substance and Sexual Activity   Alcohol use: Yes    Alcohol/week: 0.0 standard drinks    Comment: 1-2 per week   Drug use: No   Sexual activity: Not Currently  Lifestyle   Physical activity    Days per week: Not on file    Minutes per session: Not on file   Stress: Not on file  Relationships   Social connections    Talks on phone: Not on file    Gets together: Not on file    Attends religious service: Not on file    Active member of club or organization: Not on file    Attends meetings of clubs or organizations: Not on file    Relationship status: Not on file   Intimate partner violence    Fear of current or ex partner: Not on file    Emotionally abused: Not on file    Physically abused: Not on file    Forced sexual activity: Not on file  Other Topics Concern   Not on file  Social History Narrative   Not on file    Medications:   Current Outpatient Medications on File Prior to Visit  Medication Sig Dispense Refill   acetaminophen (TYLENOL) 325 MG tablet Take 650 mg by mouth 2 (two) times daily as needed for mild pain.      amiodarone (PACERONE) 200 MG tablet Take 0.5 tablets (100 mg total) by mouth daily. 45 tablet 3   apixaban (ELIQUIS) 5 MG TABS tablet Take 1 tablet (5 mg total) by mouth 2  (two) times daily. 180 tablet 3   cetirizine (ZYRTEC) 10 MG tablet Take 10 mg by mouth daily. Reported on 10/27/2015     Cholecalciferol (D 1000) 1000 units capsule Take 1,000 Units by mouth daily. Reported on 11/03/2015     FLUoxetine (PROZAC) 40 MG capsule Take 40 mg by mouth daily.     fluticasone (FLONASE) 50 MCG/ACT nasal spray Place 1 spray into both nostrils daily as needed for allergies.      levothyroxine (SYNTHROID, LEVOTHROID) 125 MCG tablet Take 125 mcg by mouth daily before breakfast.     Magnesium Oxide (MAGOX 400 PO) Take 400 mg by mouth daily.      meclizine (ANTIVERT) 25 MG tablet Take 25 mg by  mouth 3 (three) times daily as needed for dizziness or nausea.      mirtazapine (REMERON) 15 MG tablet Take 7.5 mg by mouth 2 (two) times daily.      Multiple Vitamins-Minerals (ICAPS) CAPS Take 1 capsule by mouth daily.     omeprazole (PRILOSEC) 40 MG capsule Take 40 mg by mouth 2 (two) times daily.     ondansetron (ZOFRAN) 4 MG tablet Take 4 mg by mouth every 8 (eight) hours as needed for nausea or vomiting.   1   Probiotic Product (DAILY PROBIOTIC) CAPS Take 1 tablet by mouth daily.     traZODone (DESYREL) 150 MG tablet Take 75 mg by mouth at bedtime.  0   No current facility-administered medications on file prior to visit.     Allergies:   Allergies  Allergen Reactions   Benazepril Cough    Cough   Diltiazem Hcl Rash    Today's Vitals   06/16/19 1047  BP: (!) 157/65  Pulse: 66  Temp: 98.4 F (36.9 C)  TempSrc: Oral  Weight: 134 lb 9.6 oz (61.1 kg)  Height: 5' 2.5" (1.588 m)   Body mass index is 24.23 kg/m.  General: Frail pleasant elderly Caucasian female, seated, in no evident distress Head: head normocephalic and atraumatic.   Neck: supple with no carotid or supraclavicular bruits Cardiovascular: regular rate and rhythm, no murmurs Musculoskeletal: no deformity Skin:  no rash/petichiae Vascular:  Normal pulses all extremities   Neurologic  Exam Mental Status: Awake and fully alert. Oriented to place and time. Recent memory diminished and remote memory appears to be intact. Attention span, concentration and fund of knowledge mostly appropriate throughout visit but husband would consistently speak for her. Mood and affect appropriate.  Cranial Nerves: Pupils equal, briskly reactive to light. Extraocular movements full without nystagmus. Visual fields full to confrontation with subjective blurriness.  HOH bilaterally.  Facial sensation intact. Face, tongue, palate moves normally and symmetrically.  Motor: Normal bulk and tone.  Generalized weakness throughout all extremities LU&LL > RU&RL Sensory.: intact to touch , pinprick , position and vibratory sensation.  Coordination: Rapid alternating movements normal in all extremities. Finger-to-nose and heel-to-shin performed accurately bilaterally. Gait and Station: Arises from chair with with mild difficulty. Stance is hunched. Gait demonstrates  abnormality of ambulation with mild imbalance and use of rolling walker Reflexes: 1+ and symmetric. Toes downgoing.       ASSESSMENT: Teresa York is a 78 y.o. 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  Residual left hemiparesis with ambulation difficulty with has been reporting worsening of symptoms along with worsening of urinary urgency, blurred vision, cognition/memory and hearing   PLAN: -Due to slowly worsening cognition, will obtain lab work as well as EEG to look for reversible causes of memory.  Discussion regarding participation in speech therapy but patient would like to hold off at this time.  Advised that if lab work and EEG unremarkable, to continue to follow with PCP for ongoing monitoring and management of memory -Advised to follow-up with urology due to urgency -unlikely secondary to stroke -Recommend following up with  ophthalmology with recent referral to low vision specialist for visual concerns -Recommend restarting physical therapy with potential need of outpatient therapy for ongoing balance difficulties and generalized weakness.  Discussion regarding importance of fall prevention with potential need of a different type of walker as she is currently using Rollator walker but this  can be further evaluated by therapy -Continue Eliquis (apixaban) daily  and atorvastatin 20mg   for secondary stroke prevention -F/u with PCP regarding your HLD and HTN management -f/u with cardiologist for AF on [facility] management -continue to monitor BP at home -Maintain strict control of hypertension with blood pressure goal below 130/90, diabetes with hemoglobin A1c goal below 6.5% and cholesterol with LDL cholesterol (bad cholesterol) goal below 70 mg/dL. I also advised the patient to eat a healthy diet with plenty of whole grains, cereals, fruits and vegetables, exercise regularly and maintain ideal body weight.  Majority of concerns are likely more related to other etiologies and not necessarily from prior stroke.  Follow-up duration will be determined after completion of testing  Greater than 50% of time during this 25 minute visit was spent on discussion regarding recurrent concerns by husband, discussion regarding need of following up with other providers for these ongoing concerns, planning of further management, discussion with patient and family and answered all questions to satisfaction  Frann Rider, [facility]  [facility] [address] [place], [place] [address]  Phone [phone] Fax [phone] Note: This document was prepared with digital dictation and possible smart phrase technology. Any transcriptional errors that result from this process are unintentional.

## 2019-06-17 ENCOUNTER — Other Ambulatory Visit: Payer: Self-pay | Admitting: *Deleted

## 2019-06-17 ENCOUNTER — Encounter: Payer: Self-pay | Admitting: Adult Health

## 2019-06-17 DIAGNOSIS — Z79899 Other long term (current) drug therapy: Secondary | ICD-10-CM

## 2019-06-18 LAB — DEMENTIA PANEL
Homocysteine: 10.3 umol/L (ref 0.0–19.2)
RPR Ser Ql: NONREACTIVE
TSH: 2.02 u[IU]/mL (ref 0.450–4.500)
Vitamin B-12: 1072 pg/mL (ref 232–1245)

## 2019-06-18 NOTE — Progress Notes (Signed)
I agree with the above plan 

## 2019-06-22 ENCOUNTER — Telehealth: Payer: Self-pay | Admitting: *Deleted

## 2019-06-22 NOTE — Telephone Encounter (Signed)
-----   Message from Frann Rider, NP sent at 06/18/2019  5:33 PM EDT ----- Please advise patient that the lab work did not show any reversible causes of memory loss

## 2019-06-22 NOTE — Telephone Encounter (Signed)
LMVM for pt to return call for lab results.  

## 2019-06-24 ENCOUNTER — Other Ambulatory Visit: Payer: Self-pay | Admitting: Internal Medicine

## 2019-06-24 ENCOUNTER — Telehealth: Payer: Self-pay | Admitting: Internal Medicine

## 2019-06-24 MED ORDER — APIXABAN 5 MG PO TABS: 5.0000 mg | ORAL_TABLET | Freq: Two times a day (BID) | ORAL | 0 refills | Status: DC

## 2019-06-24 NOTE — Telephone Encounter (Signed)
Pt last saw Vick Frees, NP on 06/01/19, last labs 06/01/19 Creat 1.06, age 78, weight 61.1kg, based on specified criteria pt is on appropriate dosage of Eliquis 5mg  BID.  Will refill rx.

## 2019-06-24 NOTE — Telephone Encounter (Signed)
Patient went to the local pharmacy to get some Eliquis, because she left her medication at home. The pharmacy received a short term fill from Dr. Rayann Heman, but the patient states she will be on vacation for 10 days. Dr. Rayann Heman only sent an order for the patient to have 5 days worth of medication.  Please send a new rx so the patient can have enough medication to last the duration of her trip.

## 2019-06-24 NOTE — Telephone Encounter (Signed)
New message    *STAT* If patient is at the pharmacy, call can be transferred to refill team.   1. Which medications need to be refilled? (please list name of each medication and dose if known) apixaban (ELIQUIS) 5 MG TABS tablet  2. Which pharmacy/location (including street and city if local pharmacy) is medication to be sent to?[facility] [address], [place], [place] [address]  3. Do they need a 30 day or 90 day supply? Patient needs 10 days worth of this medication.

## 2019-06-24 NOTE — Telephone Encounter (Signed)
New rx sent in

## 2019-06-25 ENCOUNTER — Encounter: Payer: Self-pay | Admitting: *Deleted

## 2019-07-03 ENCOUNTER — Encounter (INDEPENDENT_AMBULATORY_CARE_PROVIDER_SITE_OTHER): Payer: Self-pay

## 2019-07-03 ENCOUNTER — Other Ambulatory Visit: Payer: PPO | Admitting: *Deleted

## 2019-07-03 ENCOUNTER — Other Ambulatory Visit: Payer: Self-pay

## 2019-07-03 DIAGNOSIS — Z79899 Other long term (current) drug therapy: Secondary | ICD-10-CM | POA: Diagnosis not present

## 2019-07-03 LAB — HEMOGLOBIN AND HEMATOCRIT, BLOOD
Hematocrit: 33.1 % — ABNORMAL LOW (ref 34.0–46.6)
Hemoglobin: 10.1 g/dL — ABNORMAL LOW (ref 11.1–15.9)

## 2019-07-06 ENCOUNTER — Other Ambulatory Visit: Payer: PPO

## 2019-07-07 ENCOUNTER — Encounter: Payer: Self-pay | Admitting: Neurology

## 2019-07-20 ENCOUNTER — Other Ambulatory Visit (INDEPENDENT_AMBULATORY_CARE_PROVIDER_SITE_OTHER): Payer: PPO

## 2019-07-20 ENCOUNTER — Other Ambulatory Visit: Payer: Self-pay

## 2019-07-20 DIAGNOSIS — R410 Disorientation, unspecified: Secondary | ICD-10-CM

## 2019-08-04 ENCOUNTER — Other Ambulatory Visit: Payer: Self-pay | Admitting: Internal Medicine

## 2019-08-04 MED ORDER — APIXABAN 5 MG PO TABS: 5.0000 mg | ORAL_TABLET | Freq: Two times a day (BID) | ORAL | 5 refills | Status: DC

## 2019-08-04 NOTE — Telephone Encounter (Signed)
°*  STAT* If patient is at the pharmacy, call can be transferred to refill team.   1. Which medications need to be refilled? (please list name of each medication and dose if known) Eliquis-please call today if possible, pt is out of her Eliquis  2. Which pharmacy/location (including street and city if local pharmacy) is medication to be sent to? [facility]  3. Do they need a 30 day or 90 day supply? 90 and refills

## 2019-08-04 NOTE — Telephone Encounter (Addendum)
Prescription refill request for Eliquis received.  Last office visit: Teresa York (06-01-2019) Scr: 1.06 (06-01-2019) Age: 78 y.o. Weight: 61.1 kg  Prescription refill sent. Called pharmacy to make sure pt had a 90 day supply  (180 tablets) with 1 refill.

## 2019-08-12 ENCOUNTER — Telehealth: Payer: Self-pay | Admitting: Adult Health

## 2019-08-12 NOTE — Telephone Encounter (Signed)
I called pts husband back.  I relayed that labs normal (no reversible causes).  He asked about EEG.  I relayed that EEG results showed normal study.  She needs f/u appt, when did you want to see 6 months?

## 2019-08-12 NOTE — Telephone Encounter (Signed)
I called and relayed to pt that per JM/NP no need for follow up since testing normal.  She was glad and would relay to her husband.

## 2019-08-12 NOTE — Telephone Encounter (Signed)
All work-up from a neurological standpoint unremarkable and as per prior office note, etiology of symptoms likely not neurological.  Advised her to continue to follow with PCP for ongoing monitoring and management.  No need to schedule follow-up visit at this time

## 2019-08-12 NOTE — Telephone Encounter (Signed)
Pt's husband called stating he needed to speak to the RN about information that he was to be given. Please advise.

## 2019-08-17 DIAGNOSIS — I130 Hypertensive heart and chronic kidney disease with heart failure and stage 1 through stage 4 chronic kidney disease, or unspecified chronic kidney disease: Secondary | ICD-10-CM | POA: Diagnosis not present

## 2019-08-17 DIAGNOSIS — E440 Moderate protein-calorie malnutrition: Secondary | ICD-10-CM | POA: Diagnosis not present

## 2019-08-17 DIAGNOSIS — I5022 Chronic systolic (congestive) heart failure: Secondary | ICD-10-CM | POA: Diagnosis not present

## 2019-08-17 DIAGNOSIS — I63411 Cerebral infarction due to embolism of right middle cerebral artery: Secondary | ICD-10-CM | POA: Diagnosis not present

## 2019-08-17 DIAGNOSIS — Z7409 Other reduced mobility: Secondary | ICD-10-CM | POA: Diagnosis not present

## 2019-08-17 DIAGNOSIS — N1831 Chronic kidney disease, stage 3a: Secondary | ICD-10-CM | POA: Diagnosis not present

## 2019-08-17 DIAGNOSIS — I4891 Unspecified atrial fibrillation: Secondary | ICD-10-CM | POA: Diagnosis not present

## 2019-08-21 DIAGNOSIS — R269 Unspecified abnormalities of gait and mobility: Secondary | ICD-10-CM | POA: Diagnosis not present

## 2019-08-21 DIAGNOSIS — R531 Weakness: Secondary | ICD-10-CM | POA: Diagnosis not present

## 2019-08-21 DIAGNOSIS — R296 Repeated falls: Secondary | ICD-10-CM | POA: Diagnosis not present

## 2019-08-21 DIAGNOSIS — Z7409 Other reduced mobility: Secondary | ICD-10-CM | POA: Diagnosis not present

## 2019-09-08 DIAGNOSIS — I69398 Other sequelae of cerebral infarction: Secondary | ICD-10-CM | POA: Diagnosis not present

## 2019-09-08 DIAGNOSIS — I6200 Nontraumatic subdural hemorrhage, unspecified: Secondary | ICD-10-CM | POA: Diagnosis not present

## 2019-09-08 DIAGNOSIS — R2689 Other abnormalities of gait and mobility: Secondary | ICD-10-CM | POA: Diagnosis not present

## 2019-09-08 DIAGNOSIS — Z7409 Other reduced mobility: Secondary | ICD-10-CM | POA: Diagnosis not present

## 2019-09-08 DIAGNOSIS — R296 Repeated falls: Secondary | ICD-10-CM | POA: Diagnosis not present

## 2019-09-08 DIAGNOSIS — R531 Weakness: Secondary | ICD-10-CM | POA: Diagnosis not present

## 2019-10-13 DIAGNOSIS — I639 Cerebral infarction, unspecified: Secondary | ICD-10-CM | POA: Diagnosis not present

## 2019-10-13 DIAGNOSIS — R531 Weakness: Secondary | ICD-10-CM | POA: Diagnosis not present

## 2019-10-13 DIAGNOSIS — Z7409 Other reduced mobility: Secondary | ICD-10-CM | POA: Diagnosis not present

## 2019-10-13 DIAGNOSIS — R296 Repeated falls: Secondary | ICD-10-CM | POA: Diagnosis not present

## 2019-10-13 DIAGNOSIS — R269 Unspecified abnormalities of gait and mobility: Secondary | ICD-10-CM | POA: Diagnosis not present

## 2019-10-13 DIAGNOSIS — I6200 Nontraumatic subdural hemorrhage, unspecified: Secondary | ICD-10-CM | POA: Diagnosis not present

## 2019-10-13 DIAGNOSIS — Z9181 History of falling: Secondary | ICD-10-CM | POA: Diagnosis not present

## 2019-10-30 DIAGNOSIS — H401213 Low-tension glaucoma, right eye, severe stage: Secondary | ICD-10-CM | POA: Diagnosis not present

## 2019-10-30 DIAGNOSIS — H3530 Unspecified macular degeneration: Secondary | ICD-10-CM | POA: Diagnosis not present

## 2019-10-30 DIAGNOSIS — Z79899 Other long term (current) drug therapy: Secondary | ICD-10-CM | POA: Diagnosis not present

## 2019-10-30 DIAGNOSIS — H2513 Age-related nuclear cataract, bilateral: Secondary | ICD-10-CM | POA: Diagnosis not present

## 2019-10-30 DIAGNOSIS — H401221 Low-tension glaucoma, left eye, mild stage: Secondary | ICD-10-CM | POA: Diagnosis not present

## 2019-11-19 ENCOUNTER — Telehealth: Payer: Self-pay | Admitting: Internal Medicine

## 2019-11-19 DIAGNOSIS — Z20822 Contact with and (suspected) exposure to covid-19: Secondary | ICD-10-CM | POA: Diagnosis not present

## 2019-11-19 DIAGNOSIS — H52223 Regular astigmatism, bilateral: Secondary | ICD-10-CM | POA: Diagnosis not present

## 2019-11-19 DIAGNOSIS — Z01812 Encounter for preprocedural laboratory examination: Secondary | ICD-10-CM | POA: Diagnosis not present

## 2019-11-19 DIAGNOSIS — H401213 Low-tension glaucoma, right eye, severe stage: Secondary | ICD-10-CM | POA: Diagnosis not present

## 2019-11-19 DIAGNOSIS — H2513 Age-related nuclear cataract, bilateral: Secondary | ICD-10-CM | POA: Diagnosis not present

## 2019-11-19 DIAGNOSIS — H2512 Age-related nuclear cataract, left eye: Secondary | ICD-10-CM | POA: Diagnosis not present

## 2019-11-19 DIAGNOSIS — H2511 Age-related nuclear cataract, right eye: Secondary | ICD-10-CM | POA: Diagnosis not present

## 2019-11-19 NOTE — Telephone Encounter (Signed)
Patient c/o Palpitations:  High priority if patient c/o lightheadedness, shortness of breath, or chest pain  1) How long have you had palpitations/irregular HR/ Afib? Are you having the symptoms now? 2 weeks  2) Are you currently experiencing lightheadedness, SOB or CP? No  3) Do you have a history of afib (atrial fibrillation) or irregular heart rhythm? Yes  4) Have you checked your BP or HR? (document readings if available):   BP: 112/82  HR: 62     5) Are you experiencing any other  symptoms? Patient's husband  states that the past few weeks the  patient's pulse has been above 80  and she has also been  experiencing chest tightness.

## 2019-11-19 NOTE — Telephone Encounter (Signed)
Call received from [facility].  Let message stating per Pt's husband her pulse has been increased above 80's for a few weeks and having chest tightness.  Was unable to reach [facility].  Will assume since Pt has scheduled procedure upcoming call was to have Pt assess prior to procedure.  Will make virtual appt with Dr. Rayann Heman for 11/24/19.  Pt surgery scheduled for 11/25/19.  Scheduler to call Pt.

## 2019-11-22 ENCOUNTER — Telehealth: Payer: Self-pay | Admitting: Cardiology

## 2019-11-22 ENCOUNTER — Emergency Department (HOSPITAL_COMMUNITY): Payer: PPO

## 2019-11-22 ENCOUNTER — Observation Stay (HOSPITAL_BASED_OUTPATIENT_CLINIC_OR_DEPARTMENT_OTHER): Payer: PPO

## 2019-11-22 ENCOUNTER — Encounter (HOSPITAL_COMMUNITY): Payer: Self-pay | Admitting: Emergency Medicine

## 2019-11-22 ENCOUNTER — Other Ambulatory Visit: Payer: Self-pay

## 2019-11-22 ENCOUNTER — Inpatient Hospital Stay (HOSPITAL_COMMUNITY)
Admission: EM | Admit: 2019-11-22 | Discharge: 2019-12-04 | DRG: 291 | Disposition: A | Discharge status: Home Health | Payer: PPO | Attending: Internal Medicine | Admitting: Internal Medicine

## 2019-11-22 DIAGNOSIS — G4733 Obstructive sleep apnea (adult) (pediatric): Secondary | ICD-10-CM | POA: Diagnosis present

## 2019-11-22 DIAGNOSIS — R591 Generalized enlarged lymph nodes: Secondary | ICD-10-CM | POA: Diagnosis present

## 2019-11-22 DIAGNOSIS — F419 Anxiety disorder, unspecified: Secondary | ICD-10-CM | POA: Diagnosis present

## 2019-11-22 DIAGNOSIS — R1310 Dysphagia, unspecified: Secondary | ICD-10-CM | POA: Diagnosis not present

## 2019-11-22 DIAGNOSIS — R062 Wheezing: Secondary | ICD-10-CM

## 2019-11-22 DIAGNOSIS — R0902 Hypoxemia: Secondary | ICD-10-CM

## 2019-11-22 DIAGNOSIS — N179 Acute kidney failure, unspecified: Secondary | ICD-10-CM | POA: Diagnosis not present

## 2019-11-22 DIAGNOSIS — I5043 Acute on chronic combined systolic (congestive) and diastolic (congestive) heart failure: Secondary | ICD-10-CM

## 2019-11-22 DIAGNOSIS — R296 Repeated falls: Secondary | ICD-10-CM | POA: Diagnosis present

## 2019-11-22 DIAGNOSIS — Z8249 Family history of ischemic heart disease and other diseases of the circulatory system: Secondary | ICD-10-CM

## 2019-11-22 DIAGNOSIS — K2270 Barrett's esophagus without dysplasia: Secondary | ICD-10-CM | POA: Diagnosis not present

## 2019-11-22 DIAGNOSIS — R918 Other nonspecific abnormal finding of lung field: Secondary | ICD-10-CM | POA: Diagnosis not present

## 2019-11-22 DIAGNOSIS — R05 Cough: Secondary | ICD-10-CM | POA: Diagnosis not present

## 2019-11-22 DIAGNOSIS — N183 Chronic kidney disease, stage 3 unspecified: Secondary | ICD-10-CM | POA: Diagnosis not present

## 2019-11-22 DIAGNOSIS — R0602 Shortness of breath: Secondary | ICD-10-CM | POA: Diagnosis present

## 2019-11-22 DIAGNOSIS — Z9049 Acquired absence of other specified parts of digestive tract: Secondary | ICD-10-CM

## 2019-11-22 DIAGNOSIS — I351 Nonrheumatic aortic (valve) insufficiency: Secondary | ICD-10-CM | POA: Diagnosis not present

## 2019-11-22 DIAGNOSIS — K449 Diaphragmatic hernia without obstruction or gangrene: Secondary | ICD-10-CM | POA: Diagnosis present

## 2019-11-22 DIAGNOSIS — R54 Age-related physical debility: Secondary | ICD-10-CM | POA: Diagnosis present

## 2019-11-22 DIAGNOSIS — Z853 Personal history of malignant neoplasm of breast: Secondary | ICD-10-CM

## 2019-11-22 DIAGNOSIS — R0789 Other chest pain: Secondary | ICD-10-CM | POA: Diagnosis present

## 2019-11-22 DIAGNOSIS — I9581 Postprocedural hypotension: Secondary | ICD-10-CM | POA: Diagnosis not present

## 2019-11-22 DIAGNOSIS — I4510 Unspecified right bundle-branch block: Secondary | ICD-10-CM | POA: Diagnosis present

## 2019-11-22 DIAGNOSIS — R072 Precordial pain: Secondary | ICD-10-CM

## 2019-11-22 DIAGNOSIS — Z8673 Personal history of transient ischemic attack (TIA), and cerebral infarction without residual deficits: Secondary | ICD-10-CM | POA: Diagnosis not present

## 2019-11-22 DIAGNOSIS — G4730 Sleep apnea, unspecified: Secondary | ICD-10-CM | POA: Diagnosis not present

## 2019-11-22 DIAGNOSIS — I440 Atrioventricular block, first degree: Secondary | ICD-10-CM | POA: Diagnosis present

## 2019-11-22 DIAGNOSIS — K219 Gastro-esophageal reflux disease without esophagitis: Secondary | ICD-10-CM | POA: Diagnosis present

## 2019-11-22 DIAGNOSIS — I509 Heart failure, unspecified: Secondary | ICD-10-CM | POA: Diagnosis not present

## 2019-11-22 DIAGNOSIS — I110 Hypertensive heart disease with heart failure: Secondary | ICD-10-CM

## 2019-11-22 DIAGNOSIS — L89891 Pressure ulcer of other site, stage 1: Secondary | ICD-10-CM | POA: Clinically undetermined

## 2019-11-22 DIAGNOSIS — F418 Other specified anxiety disorders: Secondary | ICD-10-CM | POA: Diagnosis not present

## 2019-11-22 DIAGNOSIS — I10 Essential (primary) hypertension: Secondary | ICD-10-CM | POA: Diagnosis not present

## 2019-11-22 DIAGNOSIS — E876 Hypokalemia: Secondary | ICD-10-CM | POA: Diagnosis present

## 2019-11-22 DIAGNOSIS — D509 Iron deficiency anemia, unspecified: Secondary | ICD-10-CM | POA: Diagnosis present

## 2019-11-22 DIAGNOSIS — J9601 Acute respiratory failure with hypoxia: Secondary | ICD-10-CM | POA: Diagnosis not present

## 2019-11-22 DIAGNOSIS — I484 Atypical atrial flutter: Secondary | ICD-10-CM | POA: Diagnosis not present

## 2019-11-22 DIAGNOSIS — E039 Hypothyroidism, unspecified: Secondary | ICD-10-CM | POA: Diagnosis present

## 2019-11-22 DIAGNOSIS — N189 Chronic kidney disease, unspecified: Secondary | ICD-10-CM | POA: Diagnosis not present

## 2019-11-22 DIAGNOSIS — Z9189 Other specified personal risk factors, not elsewhere classified: Secondary | ICD-10-CM | POA: Diagnosis not present

## 2019-11-22 DIAGNOSIS — I130 Hypertensive heart and chronic kidney disease with heart failure and stage 1 through stage 4 chronic kidney disease, or unspecified chronic kidney disease: Principal | ICD-10-CM | POA: Diagnosis present

## 2019-11-22 DIAGNOSIS — Z7901 Long term (current) use of anticoagulants: Secondary | ICD-10-CM

## 2019-11-22 DIAGNOSIS — Z79890 Hormone replacement therapy (postmenopausal): Secondary | ICD-10-CM | POA: Diagnosis not present

## 2019-11-22 DIAGNOSIS — I5021 Acute systolic (congestive) heart failure: Secondary | ICD-10-CM | POA: Diagnosis present

## 2019-11-22 DIAGNOSIS — Z85828 Personal history of other malignant neoplasm of skin: Secondary | ICD-10-CM

## 2019-11-22 DIAGNOSIS — Z20822 Contact with and (suspected) exposure to covid-19: Secondary | ICD-10-CM | POA: Diagnosis present

## 2019-11-22 DIAGNOSIS — N1831 Chronic kidney disease, stage 3a: Secondary | ICD-10-CM | POA: Diagnosis present

## 2019-11-22 DIAGNOSIS — I361 Nonrheumatic tricuspid (valve) insufficiency: Secondary | ICD-10-CM

## 2019-11-22 DIAGNOSIS — J309 Allergic rhinitis, unspecified: Secondary | ICD-10-CM | POA: Diagnosis present

## 2019-11-22 DIAGNOSIS — R011 Cardiac murmur, unspecified: Secondary | ICD-10-CM | POA: Diagnosis not present

## 2019-11-22 DIAGNOSIS — L8990 Pressure ulcer of unspecified site, unspecified stage: Secondary | ICD-10-CM | POA: Insufficient documentation

## 2019-11-22 DIAGNOSIS — R001 Bradycardia, unspecified: Secondary | ICD-10-CM | POA: Diagnosis not present

## 2019-11-22 DIAGNOSIS — R29810 Facial weakness: Secondary | ICD-10-CM | POA: Diagnosis not present

## 2019-11-22 DIAGNOSIS — Z888 Allergy status to other drugs, medicaments and biological substances status: Secondary | ICD-10-CM

## 2019-11-22 DIAGNOSIS — I639 Cerebral infarction, unspecified: Secondary | ICD-10-CM | POA: Diagnosis not present

## 2019-11-22 DIAGNOSIS — F329 Major depressive disorder, single episode, unspecified: Secondary | ICD-10-CM | POA: Diagnosis present

## 2019-11-22 DIAGNOSIS — R0600 Dyspnea, unspecified: Secondary | ICD-10-CM | POA: Diagnosis not present

## 2019-11-22 DIAGNOSIS — R32 Unspecified urinary incontinence: Secondary | ICD-10-CM | POA: Diagnosis present

## 2019-11-22 DIAGNOSIS — R627 Adult failure to thrive: Secondary | ICD-10-CM | POA: Diagnosis present

## 2019-11-22 DIAGNOSIS — I4819 Other persistent atrial fibrillation: Secondary | ICD-10-CM | POA: Diagnosis present

## 2019-11-22 DIAGNOSIS — R634 Abnormal weight loss: Secondary | ICD-10-CM | POA: Diagnosis not present

## 2019-11-22 DIAGNOSIS — I340 Nonrheumatic mitral (valve) insufficiency: Secondary | ICD-10-CM

## 2019-11-22 DIAGNOSIS — Z79899 Other long term (current) drug therapy: Secondary | ICD-10-CM | POA: Diagnosis not present

## 2019-11-22 DIAGNOSIS — H02409 Unspecified ptosis of unspecified eyelid: Secondary | ICD-10-CM | POA: Diagnosis present

## 2019-11-22 DIAGNOSIS — Z6822 Body mass index (BMI) 22.0-22.9, adult: Secondary | ICD-10-CM

## 2019-11-22 DIAGNOSIS — I5023 Acute on chronic systolic (congestive) heart failure: Secondary | ICD-10-CM | POA: Diagnosis not present

## 2019-11-22 DIAGNOSIS — Z923 Personal history of irradiation: Secondary | ICD-10-CM

## 2019-11-22 DIAGNOSIS — I4891 Unspecified atrial fibrillation: Secondary | ICD-10-CM | POA: Diagnosis not present

## 2019-11-22 DIAGNOSIS — D638 Anemia in other chronic diseases classified elsewhere: Secondary | ICD-10-CM | POA: Diagnosis not present

## 2019-11-22 HISTORY — DX: Sleep apnea, unspecified: G47.30

## 2019-11-22 LAB — BASIC METABOLIC PANEL
Anion gap: 11 (ref 5–15)
BUN: 13 mg/dL (ref 8–23)
CO2: 23 mmol/L (ref 22–32)
Calcium: 9.1 mg/dL (ref 8.9–10.3)
Chloride: 102 mmol/L (ref 98–111)
Creatinine, Ser: 1.06 mg/dL — ABNORMAL HIGH (ref 0.44–1.00)
GFR calc Af Amer: 57 mL/min — ABNORMAL LOW (ref >60)
GFR calc non Af Amer: 50 mL/min — ABNORMAL LOW (ref >60)
Glucose, Bld: 112 mg/dL — ABNORMAL HIGH (ref 70–99)
Potassium: 3.3 mmol/L — ABNORMAL LOW (ref 3.5–5.1)
Sodium: 136 mmol/L (ref 135–145)

## 2019-11-22 LAB — CBC
HCT: 30.6 % — ABNORMAL LOW (ref 36.0–46.0)
Hemoglobin: 9.1 g/dL — ABNORMAL LOW (ref 12.0–15.0)
MCH: 22.8 pg — ABNORMAL LOW (ref 26.0–34.0)
MCHC: 29.7 g/dL — ABNORMAL LOW (ref 30.0–36.0)
MCV: 76.5 fL — ABNORMAL LOW (ref 80.0–100.0)
Platelets: 344 10*3/uL (ref 150–400)
RBC: 4 MIL/uL (ref 3.87–5.11)
RDW: 17.2 % — ABNORMAL HIGH (ref 11.5–15.5)
WBC: 6.3 10*3/uL (ref 4.0–10.5)
nRBC: 0 % (ref 0.0–0.2)

## 2019-11-22 LAB — BRAIN NATRIURETIC PEPTIDE: B Natriuretic Peptide: 1012.5 pg/mL — ABNORMAL HIGH (ref 0.0–100.0)

## 2019-11-22 LAB — TROPONIN I (HIGH SENSITIVITY)
Troponin I (High Sensitivity): 16 ng/L (ref <18)
Troponin I (High Sensitivity): 12 ng/L (ref <18)
Troponin I (High Sensitivity): 10 ng/L (ref <18)

## 2019-11-22 LAB — IRON AND TIBC
Iron: 18 ug/dL — ABNORMAL LOW (ref 28–170)
Saturation Ratios: 5 % — ABNORMAL LOW (ref 10.4–31.8)
TIBC: 396 ug/dL (ref 250–450)
UIBC: 378 ug/dL

## 2019-11-22 LAB — SARS CORONAVIRUS 2 (TAT 6-24 HRS): SARS Coronavirus 2: NEGATIVE

## 2019-11-22 LAB — POC SARS CORONAVIRUS 2 AG -  ED: SARS Coronavirus 2 Ag: NEGATIVE

## 2019-11-22 LAB — ECHOCARDIOGRAM COMPLETE

## 2019-11-22 LAB — TSH: TSH: 2.258 u[IU]/mL (ref 0.350–4.500)

## 2019-11-22 LAB — FERRITIN: Ferritin: 34 ng/mL (ref 11–307)

## 2019-11-22 MED ORDER — APIXABAN 5 MG PO TABS
5.0000 mg | ORAL_TABLET | Freq: Two times a day (BID) | ORAL | Status: DC
  Administered 2019-11-22 – 2019-11-25 (×6): 5 mg via ORAL
  Filled 2019-11-22 (×7): qty 1

## 2019-11-22 MED ORDER — VITAMIN D 25 MCG (1000 UNIT) PO TABS
1000.0000 [IU] | ORAL_TABLET | Freq: Every day | ORAL | Status: DC
  Administered 2019-11-22 – 2019-12-04 (×13): 1000 [IU] via ORAL
  Filled 2019-11-22 (×13): qty 1

## 2019-11-22 MED ORDER — AMIODARONE HCL 100 MG PO TABS
100.0000 mg | ORAL_TABLET | Freq: Every day | ORAL | Status: DC
  Administered 2019-11-22: 100 mg via ORAL
  Filled 2019-11-22 (×2): qty 1

## 2019-11-22 MED ORDER — ACETAMINOPHEN 650 MG RE SUPP: 650.0000 mg | Freq: Four times a day (QID) | RECTAL | Status: DC | PRN

## 2019-11-22 MED ORDER — TRAZODONE HCL 150 MG PO TABS
75.0000 mg | ORAL_TABLET | Freq: Every day | ORAL | Status: DC
  Administered 2019-11-22 – 2019-12-03 (×12): 75 mg via ORAL
  Filled 2019-11-22 (×12): qty 1

## 2019-11-22 MED ORDER — AMIODARONE HCL 100 MG PO TABS: 100.0000 mg | ORAL_TABLET | Freq: Every day | ORAL | Status: DC

## 2019-11-22 MED ORDER — LEVOTHYROXINE SODIUM 75 MCG PO TABS
175.0000 ug | ORAL_TABLET | Freq: Every day | ORAL | Status: DC
  Administered 2019-11-23 – 2019-12-04 (×11): 175 ug via ORAL
  Filled 2019-11-22 (×11): qty 1

## 2019-11-22 MED ORDER — SENNOSIDES-DOCUSATE SODIUM 8.6-50 MG PO TABS
1.0000 | ORAL_TABLET | Freq: Every evening | ORAL | Status: DC | PRN
  Administered 2019-11-27: 1 via ORAL
  Filled 2019-11-22: qty 1

## 2019-11-22 MED ORDER — PROMETHAZINE HCL 25 MG PO TABS
12.5000 mg | ORAL_TABLET | Freq: Four times a day (QID) | ORAL | Status: DC | PRN
  Filled 2019-11-22: qty 1

## 2019-11-22 MED ORDER — SODIUM CHLORIDE 0.9% FLUSH
3.0000 mL | Freq: Once | INTRAVENOUS | Status: AC
  Administered 2019-11-22: 3 mL via INTRAVENOUS

## 2019-11-22 MED ORDER — ACETAMINOPHEN 325 MG PO TABS
650.0000 mg | ORAL_TABLET | Freq: Four times a day (QID) | ORAL | Status: DC | PRN
  Administered 2019-11-24 – 2019-12-02 (×8): 650 mg via ORAL
  Filled 2019-11-22 (×8): qty 2

## 2019-11-22 MED ORDER — POTASSIUM CHLORIDE CRYS ER 20 MEQ PO TBCR
40.0000 meq | EXTENDED_RELEASE_TABLET | Freq: Once | ORAL | Status: AC
  Administered 2019-11-22: 40 meq via ORAL
  Filled 2019-11-22: qty 2

## 2019-11-22 MED ORDER — FUROSEMIDE 10 MG/ML IJ SOLN
40.0000 mg | Freq: Once | INTRAMUSCULAR | Status: AC
  Administered 2019-11-22: 40 mg via INTRAVENOUS
  Filled 2019-11-22: qty 4

## 2019-11-22 MED ORDER — AMIODARONE HCL 200 MG PO TABS: 100.0000 mg | ORAL_TABLET | Freq: Every day | ORAL | Status: DC

## 2019-11-22 MED ORDER — PANTOPRAZOLE SODIUM 40 MG PO TBEC
80.0000 mg | DELAYED_RELEASE_TABLET | Freq: Every day | ORAL | Status: DC
  Administered 2019-11-22 – 2019-12-04 (×13): 80 mg via ORAL
  Filled 2019-11-22 (×14): qty 2

## 2019-11-22 MED ORDER — LATANOPROST 0.005 % OP SOLN
1.0000 [drp] | Freq: Every day | OPHTHALMIC | Status: DC
  Administered 2019-11-22 – 2019-12-03 (×13): 1 [drp] via OPHTHALMIC
  Filled 2019-11-22: qty 2.5

## 2019-11-22 NOTE — ED Notes (Signed)
Pt states that she has gone into AFib on Friday (had 2nd covid vaccine on Wednesday) - has been short of breath, with ankle swelling since. Unable to lay flat because of shortness of breath.

## 2019-11-22 NOTE — ED Provider Notes (Addendum)
[facility] EMERGENCY DEPARTMENT Provider Note   CSN: [id number] Arrival date & time: 11/22/19  7989     History Chief Complaint  Patient presents with  . Atrial Fibrillation  . Shortness of Breath    Teresa York is a 79 y.o. female.  Patient with hx afib, c/o shortness of breath and increased non prod cough for the past 2-3 days. Symptoms acute onset, moderate, constant, persistent, worse w lying flat and exertion. States hx afib, on anticoag therapy. Also states mid chest pain for past 2-3 days, symptoms occur at rest, constant, dull, non radiating, worse w coughing. No sore throat or runny nose. No fevers. No known covid exposure. States compliant w home meds. No leg pain or unilateral swelling - mild-mod bil ankle/lower leg swelling.   The history is provided by the patient.  Atrial Fibrillation Associated symptoms include chest pain and shortness of breath. Pertinent negatives include no abdominal pain and no headaches.  Shortness of Breath Associated symptoms: chest pain and cough   Associated symptoms: no abdominal pain, no fever, no headaches, no neck pain, no rash, no sore throat and no vomiting        Past Medical History:  Diagnosis Date  . Allergic rhinitis   . Anxiety   . Atrial fibrillation ([place])   . Barrett's esophagus   . Breast cancer ([place]) 2005   "left"  . Chronic lower back pain   . Chronic systolic CHF (congestive heart failure) (H[facility])    a. EF 30-35% felt to be possibly be tachy mediated from afib wtih RVR  . Depression   . Diverticulosis   . GERD (gastroesophageal reflux disease)   . Hiatal hernia   . Hypertension   . Hypothyroidism   . Left atrial enlargement   . Mitral regurgitation   . OAB (overactive bladder)   . Persistent atrial fibrillation ([place]) 10/31/2016   a. s/p failed DCCV x3. Now on amiodarone.   . 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 Stroke [facility])     Patient Active Problem List   Diagnosis Date Noted  . 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 Persistent atrial fibrillation ([place])   . Chronic systolic CHF (congestive heart failure) ([place])   . Hypertension   . Hypothyroidism   . Acute on chronic combined systolic and diastolic CHF (congestive heart failure) ([place]) 11/12/2016  . Aortic valve regurgitation 10/31/2016  . Atrial fibrillation with RVR ([place]) 10/31/2016  . Barrett's esophagus without dysplasia 10/27/2015  . Dysphagia 10/27/2015  . Loss of weight 10/27/2015  . Chronic anticoagulation 10/27/2015    Past Surgical History:  Procedure Laterality Date  . BACK SURGERY    . BREAST BIOPSY Left 2005  . BREAST LUMPECTOMY Left 2005  . CARDIOVERSION N/A 11/02/2016   Procedure: CARDIOVERSION;  Surgeon: Thayer Headings, MD;  Location: [place];  Service: Cardiovascular;  Laterality: N/A;  . CARDIOVERSION N/A 11/16/2016   Procedure: CARDIOVERSION;  Surgeon: Fay Records, MD;  Location: [place];  Service: Cardiovascular;  Laterality: N/A;  . CARDIOVERSION N/A 12/24/2016   Procedure: CARDIOVERSION;  Surgeon: Sanda Klein, MD;  Location: [facility];  Service: Cardiovascular;  Laterality: N/A;  . CARPAL TUNNEL RELEASE Bilateral 1995  . COLONOSCOPY    . ELBOW FRACTURE SURGERY Right 2009   with implant  . ESOPHAGOGASTRODUODENOSCOPY    . ESOPHAGUS SURGERY     "in the process of getting cancerous cell  off my esophagus" (10/31/2016)  . FRACTURE SURGERY    . IR CT HEAD LTD  11/14/2017  . IR PERCUTANEOUS ART THROMBECTOMY/INFUSION INTRACRANIAL INC DIAG ANGIO  11/14/2017  . LUMBAR DISC SURGERY     L5  . MOHS SURGERY     "corner of right eye; on left cheek"  . RADIOLOGY WITH ANESTHESIA N/A 11/13/2017   Procedure: IR WITH ANESTHESIA;  Surgeon: Radiologist, Medication, MD;  Location: [place];  Service: Radiology;  Laterality: N/A;  . TEE WITHOUT CARDIOVERSION N/A 11/16/2016   Procedure: TRANSESOPHAGEAL ECHOCARDIOGRAM (TEE);   Surgeon: Fay Records, MD;  Location: [place];  Service: Cardiovascular;  Laterality: N/A;  . TONSILLECTOMY    . TUBAL LIGATION  1980     OB History   No obstetric history on file.     Family History  Problem Relation Age of Onset  . Heart attack Father 95  . Colon cancer Neg Hx   . Esophageal cancer Neg Hx   . Stomach cancer Neg Hx     Social History   Tobacco Use  . Smoking status: Never Smoker  . Smokeless tobacco: Never Used  Substance Use Topics  . Alcohol use: Yes    Alcohol/week: 0.0 standard drinks    Comment: 1-2 per week  . Drug use: No    Home Medications Prior to Admission medications   Medication Sig Start Date End Date Taking? Authorizing Provider  acetaminophen (TYLENOL) 325 MG tablet Take 650 mg by mouth 2 (two) times daily as needed for mild pain.     [provider]  amiodarone (PACERONE) 200 MG tablet Take 0.5 tablets (100 mg total) by mouth daily. 11/17/18   Allred, Jeneen Rinks, MD  apixaban (ELIQUIS) 5 MG TABS tablet Take 1 tablet (5 mg total) by mouth 2 (two) times daily. 08/04/19   Allred, Jeneen Rinks, MD  cetirizine (ZYRTEC) 10 MG tablet Take 10 mg by mouth daily. Reported on 10/27/2015    [provider]  Cholecalciferol (D 1000) 1000 units capsule Take 1,000 Units by mouth daily. Reported on 11/03/2015 11/30/14   [provider]  FLUoxetine (PROZAC) 40 MG capsule Take 40 mg by mouth daily.    [provider]  fluticasone (FLONASE) 50 MCG/ACT nasal spray Place 1 spray into both nostrils daily as needed for allergies.  12/27/17   [provider]  levothyroxine (SYNTHROID, LEVOTHROID) 125 MCG tablet Take 125 mcg by mouth daily before breakfast.    [provider]  Magnesium Oxide (MAGOX 400 PO) Take 400 mg by mouth daily.     [provider]  meclizine (ANTIVERT) 25 MG tablet Take 25 mg by mouth 3 (three) times daily as needed for dizziness or nausea.  06/02/18   [provider]  mirtazapine  (REMERON) 15 MG tablet Take 7.5 mg by mouth 2 (two) times daily.  06/10/18   [provider]  Multiple Vitamins-Minerals (ICAPS) CAPS Take 1 capsule by mouth daily.    [provider]  omeprazole (PRILOSEC) 40 MG capsule Take 40 mg by mouth 2 (two) times daily.    [provider]  ondansetron (ZOFRAN) 4 MG tablet Take 4 mg by mouth every 8 (eight) hours as needed for nausea or vomiting.  06/02/18   [provider]  Probiotic Product (DAILY PROBIOTIC) CAPS Take 1 tablet by mouth daily.    [provider]  traZODone (DESYREL) 150 MG tablet Take 75 mg by mouth at bedtime. 02/06/18   [provider]  Allergies    Benazepril and Diltiazem hcl  Review of Systems   Review of Systems  Constitutional: Negative for chills and fever.  HENT: Negative for sore throat.   Eyes: Negative for redness.  Respiratory: Positive for cough and shortness of breath.   Cardiovascular: Positive for chest pain and leg swelling.  Gastrointestinal: Negative for abdominal pain and vomiting.  Endocrine: Negative for polyuria.  Genitourinary: Negative for dysuria and flank pain.  Musculoskeletal: Negative for back pain and neck pain.  Skin: Negative for rash.  Neurological: Negative for headaches.  Hematological: Does not bruise/bleed easily.  Psychiatric/Behavioral: Negative for confusion.    Physical Exam Updated Vital Signs BP 110/76   Pulse (!) 109   Temp 97.6 F (36.4 C) (Oral)   Resp 16   LMP  (LMP Unknown)   SpO2 96%   Physical Exam Vitals and nursing note reviewed.  Constitutional:      Appearance: Normal appearance. She is well-developed.  HENT:     Head: Atraumatic.     Nose: Nose normal.     Mouth/Throat:     Mouth: Mucous membranes are moist.  Eyes:     General: No scleral icterus.    Conjunctiva/sclera: Conjunctivae normal.  Neck:     Trachea: No tracheal deviation.  Cardiovascular:     Rate and Rhythm: Normal rate and regular  rhythm.     Pulses: Normal pulses.     Heart sounds: Normal heart sounds. No murmur. No friction rub. No gallop.   Pulmonary:     Effort: Pulmonary effort is normal. No respiratory distress.     Comments: Basilar rales.  Chest:     Chest wall: Tenderness present.  Abdominal:     General: Bowel sounds are normal. There is no distension.     Palpations: Abdomen is soft.     Tenderness: There is no abdominal tenderness. There is no guarding.  Genitourinary:    Comments: No cva tenderness.  Musculoskeletal:        General: No tenderness.     Cervical back: Normal range of motion and neck supple. No rigidity. No muscular tenderness.     Comments: Symmetric bil foot/ankle/lower leg edema.   Skin:    General: Skin is warm and dry.     Findings: No rash.  Neurological:     Mental Status: She is alert.     Comments: Alert, speech normal.   Psychiatric:        Mood and Affect: Mood normal.     ED Results / Procedures / Treatments   Labs (all labs ordered are listed, but only abnormal results are displayed) Results for orders placed or performed during the hospital encounter of 36/14/43  Basic metabolic panel  Result Value Ref Range   Sodium 136 135 - 145 mmol/L   Potassium 3.3 (L) 3.5 - 5.1 mmol/L   Chloride 102 98 - 111 mmol/L   CO2 23 22 - 32 mmol/L   Glucose, Bld 112 (H) 70 - 99 mg/dL   BUN 13 8 - 23 mg/dL   Creatinine, Ser 1.06 (H) 0.44 - 1.00 mg/dL   Calcium 9.1 8.9 - 10.3 mg/dL   GFR calc non Af Amer 50 (L) >60 mL/min   GFR calc Af Amer 57 (L) >60 mL/min   Anion gap 11 5 - 15  CBC  Result Value Ref Range   WBC 6.3 4.0 - 10.5 K/uL   RBC 4.00 3.87 - 5.11 MIL/uL   Hemoglobin 9.1 (L)  12.0 - 15.0 g/dL   HCT 30.6 (L) 36.0 - 46.0 %   MCV 76.5 (L) 80.0 - 100.0 fL   MCH 22.8 (L) 26.0 - 34.0 pg   MCHC 29.7 (L) 30.0 - 36.0 g/dL   RDW 17.2 (H) 11.5 - 15.5 %   Platelets 344 150 - 400 K/uL   nRBC 0.0 0.0 - 0.2 %  Brain natriuretic peptide  Result Value Ref Range   B  Natriuretic Peptide 1,012.5 (H) 0.0 - 100.0 pg/mL  POC SARS Coronavirus 2 Ag-ED - Nasal Swab (BD Veritor Kit)  Result Value Ref Range   SARS Coronavirus 2 Ag NEGATIVE NEGATIVE  Troponin I (High Sensitivity)  Result Value Ref Range   Troponin I (High Sensitivity) 16 <18 ng/L   DG Chest Portable 1 View  Result Date: 11/22/2019 CLINICAL DATA:  Pt reports afib, heart racing, SOB, wheezing, cough, body aches, and nausea x 2 weeks. EXAM: PORTABLE CHEST 1 VIEW COMPARISON:  Chest radiograph 08/26/2018, 08/03/2018 FINDINGS: Stable cardiomediastinal contours with enlarged heart size. There are diffuse bilateral interstitial opacities favored to represent mild edema. Probable trace right pleural effusion. No evidence of pneumothorax. No acute finding in the visualized skeleton. IMPRESSION: Cardiomegaly with diffuse bilateral interstitial opacities favored to represent mild pulmonary edema. Probable trace right pleural effusion. Electronically Signed   By: Audie Pinto M.D.   On: 11/22/2019 10:53    EKG EKG Interpretation  Date/Time:  Sunday November 22 2019 10:15:58 EST Ventricular Rate:  97 PR Interval:    QRS Duration: 146 QT Interval:  422 QTC Calculation: 537 R Axis:   -23 Text Interpretation: Atrial fibrillation Left axis deviation Right bundle branch block Non-specific ST-t changes Confirmed by Lajean Saver [phone]) on 11/22/2019 10:20:11 AM   Radiology DG Chest Portable 1 View  Result Date: 11/22/2019 CLINICAL DATA:  Pt reports afib, heart racing, SOB, wheezing, cough, body aches, and nausea x 2 weeks. EXAM: PORTABLE CHEST 1 VIEW COMPARISON:  Chest radiograph 08/26/2018, 08/03/2018 FINDINGS: Stable cardiomediastinal contours with enlarged heart size. There are diffuse bilateral interstitial opacities favored to represent mild edema. Probable trace right pleural effusion. No evidence of pneumothorax. No acute finding in the visualized skeleton. IMPRESSION: Cardiomegaly with diffuse  bilateral interstitial opacities favored to represent mild pulmonary edema. Probable trace right pleural effusion. Electronically Signed   By: Audie Pinto M.D.   On: 11/22/2019 10:53    Procedures Procedures (including critical care time)  Medications Ordered in ED Medications  sodium chloride flush (NS) 0.9 % injection 3 mL (has no administration in time range)    ED Course  I have reviewed the triage vital signs and the nursing notes.  Pertinent labs & imaging results that were available during my care of the patient were reviewed by me and considered in my medical decision making (see chart for details).    MDM Rules/Calculators/A&P                      Iv ns/lock. Stat labs/imaging.   Reviewed nursing notes and prior charts for additional history.   Given cough/sob, covid swab sent.   Brihany Butch was evaluated in Emergency Department on 11/22/2019 for the symptoms described in the history of present illness. She was evaluated in the context of the global COVID-19 pandemic, which necessitated consideration that the patient might be at risk for infection with the SARS-CoV-2 virus that causes COVID-19. Institutional protocols and algorithms that pertain to the evaluation of patients at risk for COVID-19  are in a state of rapid change based on information released by regulatory bodies including the CDC and federal and state organizations. These policies and algorithms were followed during the patient's care in the ED.  Labs reviewed/interpreted by me - k low. kcl po. Initial trop is normal.   CXR reviewed/interpreted by me - ?vascular congestion/edema vs bil infil.  Chadsvasc score 4.   Additional labs reviewed/interpreted by me - bnp markedly elevated/higher than priors. Lasix iv.   Covid test is negative.   Given chest pain, sob, worsening chf, will admit.   Recheck on monitor. In afib, hr 104 - at times hr in range 110-115. Feel CHF likely multifactorial, pt does have  afib, rate is mildly increased, also with hx MR/AR. Likely would benefit from diuresis and med adjustment. Is noted w ? Allergic rxn to diltiazem in past.  Unassigned medicine consulted for admission.  Discussed pt with internal medicine roc will admit.     Final Clinical Impression(s) / ED Diagnoses Final diagnoses:  None    Rx / DC Orders ED Discharge Orders    None           Lajean Saver, MD 11/22/19 1228

## 2019-11-22 NOTE — Progress Notes (Signed)
  Echocardiogram 2D Echocardiogram has been performed.  Teresa York 11/22/2019, 2:55 PM

## 2019-11-22 NOTE — Telephone Encounter (Signed)
Received page from patient's husband that for the past two weeks, the patient has been in atrial fibrillation (since her COVID-19 vaccine). For the past two weeks, HR 112-120 and tonight HR is 137, with blood pressure 126/58. Patient's husband reports that starting last night, she has been having chest congestion with lying flat at night - describes a sensation of having a panic attack due to difficulty breathing when she tries to lay down. Having some shortness of breath now even seated, no chest pain, light headedness, syncope, LEE. Has not missed any doses of amiodarone or eliquis. Prior notes document persistent afib but rate controlled.  Discussed with the patient's husband that I would recommend he drive her to the emergency room tonight due to her shortness of breath and significantly elevated HR. They noted understanding and he will drive her to the ED.  Bryna Colander, MD

## 2019-11-22 NOTE — Progress Notes (Signed)
RT NOTE:  Pt refuses CPAP tonight. Pt has tried to wear at home, however, she is unable to tolerate.

## 2019-11-22 NOTE — Evaluation (Signed)
Physical Therapy Evaluation Patient Details Name: Teresa York MRN: [id number] DOB: [birth year] Today's Date: 11/22/2019   History of Present Illness  Pt is a 79 y.o. F with significant PMH of stroke, afib, CHF, who presents with SOB, elevated HR, and hypokalemia. Received IV lasix in ED.   Clinical Impression  Prior to admission, pt lives with her husband, uses a Rollator for ambulation, and requires assist for IADL's. Has baseline visual impairments from prior stroke and cataracts and left sided weakness. On PT evaluation, pt presents with decreased cardiovascular endurance and weakness. Ambulating 100 feet with a walker at a min guard assist level. SpO2 88-92% on RA, HR 108-122 (afib). Will continue to follow acutely to progress mobility as tolerated.     Follow Up Recommendations No PT follow up;Supervision/Assistance - 24 hour (pt politely declining f/u therapy)    Equipment Recommendations  None recommended by PT    Recommendations for Other Services       Precautions / Restrictions Precautions Precautions: Fall;Other (comment) Precaution Comments: watch HR Restrictions Weight Bearing Restrictions: No      Mobility  Bed Mobility Overal bed mobility: Modified Independent                Transfers Overall transfer level: Needs assistance Equipment used: None Transfers: Sit to/from Stand Sit to Stand: Supervision            Ambulation/Gait Ambulation/Gait assistance: Min guard Gait Distance (Feet): 100 Feet Assistive device: Rolling walker (2 wheeled);None Gait Pattern/deviations: Step-through pattern;Decreased stride length;Scissoring;Decreased step length - left Gait velocity: decreased   General Gait Details: Pt with initial scissoring with short distance without RW, improved with use of RW. Min guard for stability, fatigues easily  Stairs            Wheelchair Mobility    Modified Rankin (Stroke Patients Only)       Balance Overall balance  assessment: Needs assistance Sitting-balance support: Feet supported Sitting balance-Leahy Scale: Good     Standing balance support: No upper extremity supported;During functional activity Standing balance-Leahy Scale: Fair                               Pertinent Vitals/Pain Pain Assessment: No/denies pain    Home Living Family/patient expects to be discharged to:: Private residence Living Arrangements: Spouse/significant other Available Help at Discharge: Family;Available 24 hours/day Type of Home: House Home Access: Stairs to enter Entrance Stairs-Rails: Psychiatric nurse of Steps: 3 Home Layout: One level Home Equipment: Environmental consultant - 4 wheels      Prior Function Level of Independence: Needs assistance   Gait / Transfers Assistance Needed: Uses Rollator, requires assist for steps  ADL's / Homemaking Assistance Needed: Husband assists with IADL's i.e. medication management, cooking due to visual impairment        Hand Dominance        Extremity/Trunk Assessment   Upper Extremity Assessment Upper Extremity Assessment: Defer to OT evaluation    Lower Extremity Assessment Lower Extremity Assessment: Generalized weakness;LLE deficits/detail LLE Deficits / Details: Residual LLE weakness from stroke    Cervical / Trunk Assessment Cervical / Trunk Assessment: Kyphotic  Communication   Communication: No difficulties  Cognition Arousal/Alertness: Awake/alert Behavior During Therapy: WFL for tasks assessed/performed Overall Cognitive Status: Within Functional Limits for tasks assessed  General Comments      Exercises     Assessment/Plan    PT Assessment Patient needs continued PT services  PT Problem List Decreased strength;Decreased activity tolerance;Decreased balance;Decreased mobility;Cardiopulmonary status limiting activity       PT Treatment Interventions Gait  training;DME instruction;Stair training;Functional mobility training;Therapeutic activities;Therapeutic exercise;Balance training;Patient/family education    PT Goals (Current goals can be found in the Care Plan section)  Acute Rehab PT Goals Patient Stated Goal: increase mobility PT Goal Formulation: With patient Time For Goal Achievement: 12/06/19 Potential to Achieve Goals: Good    Frequency Min 3X/week   Barriers to discharge        Co-evaluation               AM-PAC PT "6 Clicks" Mobility  Outcome Measure Help needed turning from your back to your side while in a flat bed without using bedrails?: None Help needed moving from lying on your back to sitting on the side of a flat bed without using bedrails?: None Help needed moving to and from a bed to a chair (including a wheelchair)?: A Little Help needed standing up from a chair using your arms (e.g., wheelchair or bedside chair)?: A Little Help needed to walk in hospital room?: A Little Help needed climbing 3-5 steps with a railing? : A Little 6 Click Score: 20    End of Session Equipment Utilized During Treatment: Gait belt Activity Tolerance: Patient tolerated treatment well Patient left: in bed;with call bell/phone within reach;with nursing/sitter in room Nurse Communication: Mobility status PT Visit Diagnosis: Unsteadiness on feet (R26.81);Muscle weakness (generalized) (M62.81);Difficulty in walking, not elsewhere classified (R26.2)    Time: [id number] PT Time Calculation (min) (ACUTE ONLY): 18 min   Charges:   PT Evaluation $PT Eval Moderate Complexity: 1 Mod            Wyona Almas, PT, DPT Acute Rehabilitation Services Pager [phone] Office [phone]   Deno Etienne 11/22/2019, 5:11 PM

## 2019-11-22 NOTE — ED Triage Notes (Signed)
Pt reports afib, heart racing, SOB, wheezing, cough, body aches, and nausea x 2 weeks.  Pt relates symptoms to getting COVID vaccine.

## 2019-11-22 NOTE — H&P (Addendum)
Date: 11/22/2019               Patient Name:  Teresa York MRN: [id number]  DOB: [birth year] Age / Sex: 79 y.o., female   PCP: Doreatha Lew, MD              Medical Service: Internal Medicine Teaching Service              Attending Physician: Dr. Evette Doffing, Mallie Mussel, *    First Contact: Bonnetta Barry, MS 4 Pager: [phone]  Second Contact: Dr. Lars Mage              After Hours (After 5p/  First Contact Pager: [phone]  weekends / holidays): Second Contact Pager: [phone]   Chief Complaint: Chest tightness  History of Present Illness:  Teresa York states that she developed tightness in her chest, SOB, and racing heart rate two weeks ago.  At that time, she had just received her first COVID vaccine (Feb 3) and developed a runny nose and sore throat the evening she received her vaccine, which persisted until Friday evening 2/12.  Chest pain is present at rest and is worse when she exerts herself, even with pick up a pen to write.  The pain radiates to her back and is worse with deep breaths.  Last night, her CP worsened, she developed nausea and vomited three times after attempting to eat oatmeal.  At that time, she also became hot and began sweating.  Denies dizziness.  She presented to the ED today because she "felt like [she] was going to die."  She generally sleeps on one pillow, but slept on three pillows last night because she felt SOB.  She also noticed swelling in her feet last night, which wasn't present in the prior two weeks.  Over the past 2 days, she has noticed her stomach "swelling," and has had a decreased appetite.  She noticed her pants fit a little snugger recently.  She weighs herself about once weekly and we last home weight was 127 lbs.  Walking very short distances results in SOB-- she has to rest after walking 10 feet.  She does not take her fluid pill because she has existing bladder problems.  Denies recent changes in medications.  Her husband manages her  medications and she asked the medical team to call him to update him and review medications.  She denies recent life stressors, though states she has been very concerned about COVID-19 since the pandemic began.    Patient states she was diagnosed with afib 4-5 years ago.  She takes amiodarone daily, but has never been electrically cardioverted. She had a stroke 2 years ago in Feb 2019 and was hospitalized at that time and never completed recovered.  She was working with physical therapy at home until a couple weeks ago.  Since her stroke, she has unintentioally lost ~100 lbs.  She used to weight 237 lbs prior to the stroke.  For breakfast, she usually eats 2 eggs, toast and juice, skips lunch.  She follows with her cardiologist (Dr. Rayann Heman and Dr. Julianne Handler).  She states Dr. Rayann Heman had planned to perform an ablation 2 years ago, but then she had a stoke and this was not complete.    Her last fall was May 14, 2019 as she was getting something out of the fridge.  She denies CP, racing heart rate, or dizziness at that time.  Most recent hospitalization was Nov 2019-- gallbladder removed.  She is scheduled to have cataract surgery this Wednesday 2/17  Collateral from husband, Teresa York: Review med list (see below).  Husband reports she has had very poor appetite since her stroke.  Her entire daily po intake often consists of a poptart, 1/2 pack of oatmeal, and 4-5 tsp of applesauce.  She has had a stomachache for the past week and has had "uncontrollable" BMs.  She has had urinary incontinence since her stroke 2 years ago.    ED course:  K+ low, given KCl.  Troponin wnl.  Ordered ECH and echo.  BNP elevated, given IV lasix.  COVID neg.    Meds: Per husband -  Amiodarone 200 mg tablet -- takes 1/2 tablet (100mg ) daily qAM -  Complex B12 daily qAM -  Zyrtec 10 mg daily qAM -  Vit D, 25 mg daily qAM -  Probiotic daily qAM -  Eliquis 5 mg BID -  Fluoxetine 40 mg daily qAM -  ICaps AREDS BID -   Levothyroxine 175 mg daily qAM -  Potassium chloride  10 meq daily -  MagOx every other day -- does not take daily d/t GI upset -  Megestrol, does not take daily -  Latanoprost  Eye drops nightly -  Omeprazole 40 mg nightly -  Trazodone 150mg  -- takes 1/2 tablet (75 mg) nightly -  Tylenol PM nightly  -  Flonase PRN -  Diprosone cream (betamethasone diproprionate) PRN   Allergies: Allergies as of 11/22/2019 - Review Complete 11/22/2019  Allergen Reaction Noted  . Benazepril Cough 04/26/2014  . Diltiazem hcl Rash 04/26/2014   Past Medical History:  Diagnosis Date  . Allergic rhinitis   . Anxiety   . Atrial fibrillation ([place])   . Barrett's esophagus   . Breast cancer ([place]) 2005   "left"  . Chronic lower back pain   . Chronic systolic CHF (congestive heart failure) ([facility])    a. EF 30-35% felt to be possibly be tachy mediated from afib wtih RVR  . Depression   . Diverticulosis   . GERD (gastroesophageal reflux disease)   . Hiatal hernia   . Hypertension   . Hypothyroidism   . Left atrial enlargement   . Mitral regurgitation   . OAB (overactive bladder)   . Persistent atrial fibrillation ([place]) 10/31/2016   a. s/p failed DCCV x3. Now on amiodarone.   . 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 Stroke [facility])     Family History:  Family History  Problem Relation Age of Onset  . Heart attack Father 95  . Congestive Heart Failure Father   . Thyroid cancer Sister   . Breast cancer Daughter   . Bone cancer Daughter        Ewing sarcoma  . Colon cancer Neg Hx   . Esophageal cancer Neg Hx   . Stomach cancer Neg Hx     Social History:  Ms. Marsicano lives at home with her husband, Teresa York, in [place]. She has two children, a son and a daughter.  Her son lives in [place] and has 2 children.  Her daughter lives in [place] and does not have children.  Patient rarely drinks alcohol.  She is a never smoker and has not used other tobacco products.    Review  of Systems:  Per HPI  Physical Exam: Blood pressure 138/82, pulse (!) 101, temperature 97.6 F (36.4 C), temperature source Oral, resp. rate (!) 21, SpO2 100 %.  Gen:  Elderly woman lying in bed in NAD HEENT:  Atraumatic, normocephalic Cardio:  Irregularly irregular rhythm, tachycardic, no murmurs, rubs, gallops Pulm:  CTAB, no wheezes, or crackles.  Positive egophony in bilateral lung bases Abd: Normoactive bowel sounds, non-distended, no fluid wave, mild tenderness in suprapubic region Extremities:  1+ LE edema bilaterally Neuro:  A&Ox3.  CNs grossly intact.    EKG: Atrial fibrillation Left axis deviation Right bundle branch block Non-specific ST-t changes   CXR:  FINDINGS:  Stable cardiomediastinal contours with enlarged heart size. There are diffuse bilateral interstitial opacities favored to represent mild edema. Probable trace right pleural effusion. No evidence of pneumothorax. No acute finding in the visualized skeleton. IMPRESSION: Cardiomegaly with diffuse bilateral interstitial opacities favored to represent mild pulmonary edema. Probable trace right pleural effusion.  Echo 11/22/2019:   1. Left ventricular ejection fraction, by estimation, is 35 to 40%. The  left ventricle has moderately decreased function. The left ventricle  demonstrates global hypokinesis. Left ventricular diastolic function could  not be evaluated.  2. Right ventricular systolic function is moderately reduced. The right  ventricular size is normal. There is mildly elevated pulmonary artery  systolic pressure. The estimated right ventricular systolic pressure is  [id number] mmHg.  3. Left atrial size was severely dilated.  4. Right atrial size was severely dilated.  5. The mitral valve is normal in structure and function. Mild to moderate  mitral valve regurgitation.  6. The aortic valve is tricuspid. Aortic valve regurgitation is moderate.  Mild to moderate aortic valve sclerosis/calcification is  present, without  any evidence of aortic stenosis.  7. The inferior vena cava is normal in size with <50% respiratory  variability, suggesting right atrial pressure of 8 mmHg.   -  Echo Feb 2019 with EF 60-65%, moderate aortic and mitral valve regurg, moderate dilation of LA, and systolic pressure of pulmonary arteries moderately increased to 53 mmHg. -  Echo Jan 2018 with EF 30% with diffuse hypokinesis and moderate LV hypertrophy; moderate aortic regurg and mild mitral regurg, moderate dilation of LA; systolic pressure of of pulmonary arteries mildly increased to 35 mmHg -  Echo June 2017 with EF 55-60%. -  Echo March 2015-- Mild conc LVH, EF 55-60, no RWMA, Gr 1 DD, mild to mod AI, aortic sclerosis without stenosis, trivial MR, severe LAE (mL/m2), mild RAE, mild TR     Assessment & Plan by Problem: Active Problems:   Chest tightness  Ms. Suess is an 79 yo female with hx of Barrett's esophagus without dysplasia, atrial fibrillation, combined systolic and diastolic CHF, HTN, hypothyroidism, stroke (2019) who presented to to the ED with worsening SOB and chest pain.    #SOB  Chest Pain  Afib  Combined systolic and diastolic CHF:   Patient with progressive SOB and CP over the past two weeks, with acute worsening for the past 2 days.  Pro-BNP significantly elevated 1,012 on admission.  CHF exacerbation likely caused by afib possibly 2/2 to mild respiratory rxn from COVID vaccine.  Uncertain how long patient has been in afib, but likely persistent.   She underwent unsuccessful electrical cardioversion in the past (11/02/16, 11/16/16, 12/24/16).  Patient does not take Lasix at home d/t urinary incontinence, contributing to fluid overload.  Echo showed reduced EF 35-40% (probably underestimated d/t afib) and LV global hypokinesis, and severe dilation of LA and RA.  Must consider new ischemic event as cause of acute CHF exacerbation, but high sensitivity troponin neg x2 and ECG with afib but no  ischemic  changes.   -  Took morning dose of amiodarone 100 mg today -  Received IV lasix in the ED -  Continue home eliquis -  TSH pending -  Strict Is/Os -  Daily Weights  #Hypokalemia:  3.3 today.  Takes potassium chloride 10 meq daily. -  Replete as needed  #Microcytic anemia:  Likely iron deficiency anemia with low Fe and low ferritin. H/H is stable from 5 months ago.   Hx of stroke (Feb 2019) - Continue home Eliquis  Dispo: Admit patient to Inpatient with expected length of stay greater than 2 midnights.   Teresa York, Medical Student 11/22/2019, 2:39 PM   Attestation for Student Documentation:  I personally was present and performed or re-performed the history, physical exam and medical decision-making activities of this service and have verified that the service and findings are accurately documented in the student's note.  Lars Mage, MD 11/22/2019, 4:49 PM

## 2019-11-23 ENCOUNTER — Encounter (HOSPITAL_COMMUNITY): Payer: Self-pay | Admitting: Student in an Organized Health Care Education/Training Program

## 2019-11-23 DIAGNOSIS — I5021 Acute systolic (congestive) heart failure: Secondary | ICD-10-CM | POA: Diagnosis not present

## 2019-11-23 DIAGNOSIS — N189 Chronic kidney disease, unspecified: Secondary | ICD-10-CM | POA: Diagnosis not present

## 2019-11-23 DIAGNOSIS — Z9189 Other specified personal risk factors, not elsewhere classified: Secondary | ICD-10-CM | POA: Diagnosis not present

## 2019-11-23 DIAGNOSIS — I484 Atypical atrial flutter: Secondary | ICD-10-CM | POA: Diagnosis not present

## 2019-11-23 DIAGNOSIS — R918 Other nonspecific abnormal finding of lung field: Secondary | ICD-10-CM | POA: Diagnosis not present

## 2019-11-23 DIAGNOSIS — I440 Atrioventricular block, first degree: Secondary | ICD-10-CM | POA: Diagnosis present

## 2019-11-23 DIAGNOSIS — F419 Anxiety disorder, unspecified: Secondary | ICD-10-CM | POA: Diagnosis present

## 2019-11-23 DIAGNOSIS — R001 Bradycardia, unspecified: Secondary | ICD-10-CM | POA: Diagnosis not present

## 2019-11-23 DIAGNOSIS — I4891 Unspecified atrial fibrillation: Secondary | ICD-10-CM | POA: Diagnosis not present

## 2019-11-23 DIAGNOSIS — I5023 Acute on chronic systolic (congestive) heart failure: Secondary | ICD-10-CM | POA: Diagnosis present

## 2019-11-23 DIAGNOSIS — I110 Hypertensive heart disease with heart failure: Secondary | ICD-10-CM | POA: Diagnosis not present

## 2019-11-23 DIAGNOSIS — N1831 Chronic kidney disease, stage 3a: Secondary | ICD-10-CM | POA: Diagnosis present

## 2019-11-23 DIAGNOSIS — J309 Allergic rhinitis, unspecified: Secondary | ICD-10-CM | POA: Diagnosis present

## 2019-11-23 DIAGNOSIS — Z20822 Contact with and (suspected) exposure to covid-19: Secondary | ICD-10-CM | POA: Diagnosis present

## 2019-11-23 DIAGNOSIS — Z79890 Hormone replacement therapy (postmenopausal): Secondary | ICD-10-CM

## 2019-11-23 DIAGNOSIS — I130 Hypertensive heart and chronic kidney disease with heart failure and stage 1 through stage 4 chronic kidney disease, or unspecified chronic kidney disease: Secondary | ICD-10-CM | POA: Diagnosis present

## 2019-11-23 DIAGNOSIS — N179 Acute kidney failure, unspecified: Secondary | ICD-10-CM

## 2019-11-23 DIAGNOSIS — I4510 Unspecified right bundle-branch block: Secondary | ICD-10-CM | POA: Diagnosis present

## 2019-11-23 DIAGNOSIS — R0602 Shortness of breath: Secondary | ICD-10-CM | POA: Diagnosis present

## 2019-11-23 DIAGNOSIS — N183 Chronic kidney disease, stage 3 unspecified: Secondary | ICD-10-CM | POA: Diagnosis not present

## 2019-11-23 DIAGNOSIS — F329 Major depressive disorder, single episode, unspecified: Secondary | ICD-10-CM | POA: Diagnosis present

## 2019-11-23 DIAGNOSIS — R296 Repeated falls: Secondary | ICD-10-CM | POA: Diagnosis present

## 2019-11-23 DIAGNOSIS — D509 Iron deficiency anemia, unspecified: Secondary | ICD-10-CM | POA: Diagnosis present

## 2019-11-23 DIAGNOSIS — E039 Hypothyroidism, unspecified: Secondary | ICD-10-CM | POA: Diagnosis present

## 2019-11-23 DIAGNOSIS — E876 Hypokalemia: Secondary | ICD-10-CM | POA: Diagnosis present

## 2019-11-23 DIAGNOSIS — I4819 Other persistent atrial fibrillation: Secondary | ICD-10-CM | POA: Diagnosis present

## 2019-11-23 DIAGNOSIS — I5043 Acute on chronic combined systolic (congestive) and diastolic (congestive) heart failure: Secondary | ICD-10-CM | POA: Diagnosis present

## 2019-11-23 DIAGNOSIS — Z79899 Other long term (current) drug therapy: Secondary | ICD-10-CM | POA: Diagnosis not present

## 2019-11-23 DIAGNOSIS — R32 Unspecified urinary incontinence: Secondary | ICD-10-CM | POA: Diagnosis present

## 2019-11-23 DIAGNOSIS — G4733 Obstructive sleep apnea (adult) (pediatric): Secondary | ICD-10-CM | POA: Diagnosis present

## 2019-11-23 DIAGNOSIS — J9601 Acute respiratory failure with hypoxia: Secondary | ICD-10-CM | POA: Diagnosis not present

## 2019-11-23 DIAGNOSIS — R627 Adult failure to thrive: Secondary | ICD-10-CM | POA: Diagnosis present

## 2019-11-23 DIAGNOSIS — K219 Gastro-esophageal reflux disease without esophagitis: Secondary | ICD-10-CM | POA: Diagnosis present

## 2019-11-23 DIAGNOSIS — I9581 Postprocedural hypotension: Secondary | ICD-10-CM | POA: Diagnosis not present

## 2019-11-23 LAB — BASIC METABOLIC PANEL
Anion gap: 12 (ref 5–15)
BUN: 14 mg/dL (ref 8–23)
CO2: 24 mmol/L (ref 22–32)
Calcium: 9.4 mg/dL (ref 8.9–10.3)
Chloride: 104 mmol/L (ref 98–111)
Creatinine, Ser: 1.49 mg/dL — ABNORMAL HIGH (ref 0.44–1.00)
GFR calc Af Amer: 38 mL/min — ABNORMAL LOW (ref >60)
GFR calc non Af Amer: 33 mL/min — ABNORMAL LOW (ref >60)
Glucose, Bld: 103 mg/dL — ABNORMAL HIGH (ref 70–99)
Potassium: 4.1 mmol/L (ref 3.5–5.1)
Sodium: 140 mmol/L (ref 135–145)

## 2019-11-23 LAB — CBC
HCT: 31.4 % — ABNORMAL LOW (ref 36.0–46.0)
Hemoglobin: 9.1 g/dL — ABNORMAL LOW (ref 12.0–15.0)
MCH: 22 pg — ABNORMAL LOW (ref 26.0–34.0)
MCHC: 29 g/dL — ABNORMAL LOW (ref 30.0–36.0)
MCV: 76 fL — ABNORMAL LOW (ref 80.0–100.0)
Platelets: 339 10*3/uL (ref 150–400)
RBC: 4.13 MIL/uL (ref 3.87–5.11)
RDW: 17.4 % — ABNORMAL HIGH (ref 11.5–15.5)
WBC: 8.5 10*3/uL (ref 4.0–10.5)
nRBC: 0 % (ref 0.0–0.2)

## 2019-11-23 MED ORDER — FUROSEMIDE 10 MG/ML IJ SOLN
40.0000 mg | Freq: Every day | INTRAMUSCULAR | Status: DC
  Administered 2019-11-23: 40 mg via INTRAVENOUS

## 2019-11-23 MED ORDER — AMIODARONE HCL 100 MG PO TABS
100.0000 mg | ORAL_TABLET | Freq: Two times a day (BID) | ORAL | Status: DC
  Administered 2019-11-23 – 2019-11-26 (×6): 100 mg via ORAL
  Filled 2019-11-23 (×5): qty 1

## 2019-11-23 MED ORDER — FUROSEMIDE 10 MG/ML IJ SOLN
40.0000 mg | Freq: Every day | INTRAMUSCULAR | Status: DC
  Filled 2019-11-23: qty 4

## 2019-11-23 MED ORDER — FLUOXETINE HCL 20 MG PO CAPS
40.0000 mg | ORAL_CAPSULE | Freq: Every day | ORAL | Status: DC
  Administered 2019-11-23 – 2019-12-04 (×11): 40 mg via ORAL
  Filled 2019-11-23 (×12): qty 2

## 2019-11-23 MED ORDER — METOPROLOL TARTRATE 12.5 MG HALF TABLET
12.5000 mg | ORAL_TABLET | Freq: Two times a day (BID) | ORAL | Status: DC
  Administered 2019-11-23 – 2019-11-24 (×4): 12.5 mg via ORAL
  Filled 2019-11-23 (×5): qty 1

## 2019-11-23 MED ORDER — ADULT MULTIVITAMIN W/MINERALS CH
1.0000 | ORAL_TABLET | Freq: Every day | ORAL | Status: DC
  Administered 2019-11-23 – 2019-12-04 (×12): 1 via ORAL
  Filled 2019-11-23 (×12): qty 1

## 2019-11-23 MED ORDER — AMIODARONE HCL 100 MG PO TABS
100.0000 mg | ORAL_TABLET | Freq: Every day | ORAL | Status: DC
  Administered 2019-11-23: 100 mg via ORAL
  Filled 2019-11-23: qty 1

## 2019-11-23 MED ORDER — ENSURE ENLIVE PO LIQD
237.0000 mL | Freq: Two times a day (BID) | ORAL | Status: DC
  Administered 2019-11-23 – 2019-11-25 (×3): 237 mL via ORAL

## 2019-11-23 NOTE — Telephone Encounter (Signed)
Virtual visit for 2/16 cancelled d/t Pt in hospital. Pt has f/u with RU scheduled for 2/26.

## 2019-11-23 NOTE — Consult Note (Addendum)
Cardiology Consultation:   Patient ID: Teresa York MRN: [id number]; DOB: 05-11-1941  Admit date: 11/22/2019 Date of Consult: 11/23/2019  Primary Care Provider: Patrecia Pour, Christean Grief, MD Primary Cardiologist: Lauree Chandler, MD  Primary Electrophysiologist:  Thompson Grayer, MD    Patient Profile:   Teresa York is a 79 y.o. female with a hx of hypertension, prior stroke 11/2017, Barrett's esophagus, GERD, chronic back pain, breast cancer (left s/p lumpectomy), hypothyroidism, OSA with CPAP, and NICM (suspected secondary to RVR) with recovered EF, chronic CHF (EF 60-65% 2019), and persistent A. Fib s/p multiple cardioversions who is being seen today for the evaluation of decreased EF and A. fib at the request of Dr. Evette Doffing.  History of Present Illness:   Teresa York is followed by Dr. Rayann Heman. She had been seen by Dr.Mcalhaney in the past.  Patient has a history of nonischemic cardiomyopathy with recovered EF. The patient was diagnosed with Afib in 10/2013 by her PCP on a regular office visit. She was started on Xarelto and Toprol. Echo 12/2013 showed normal EF, mild to mod AI, LAE. In 10/2016 she was seen in the office and found to be in A. fib with RVR and acute CHF. She was admitted to the hospital diuresed. Echo at that time showed EF significantly decreased to 30 to 35%. This was felt to be secondary to A. fib. Patient underwent successful cardioversion. Patient was seen in follow-up and noted to be in A. fib with RVR and acute heart failure and she was readmitted.  Amiodarone was added to her regimen and the patient was diuresed with IV lasix. Repeat TEE /DCCV was initially successful but eventually returned to A. fib /flutter.  ACE/ARB were not added due to soft pressures and renal insufficiency.  In hospital follow-up the patient was much better rate controlled with amiodarone and toprol. She was set up for outpatient cardioversion with was initially successful but at her follow-up 12/2016 she was  noted to be back in A. Fib and she was referred to EP. She had a stroke 11/2017 while off Xarelto after GIB. During the hospitalization she was started on Eliquis and Aspirin.  Follow-up echo 11/2017 showed 65%, no wall motion abnormalities, moderate AR, dilated dilated right atrium, mild TR.   The patient was last seen 06/01/2019 by Renee. Since her stroke the patient had multiple mechanical falls. Patient and her husband were concerned given her need for anticoagulation. Patient had started PT and OT. From a cardiac perspective the patient was stable. Patient remained on Eliquis 5 mg twice daily and amiodarone 100 mg daily for A. Fib. Patient had previously been taking Metoprolol 12.5 mg daily but this was discontinued due to bradycardia.  The patient presented to the ED 11/22/2019 for atrial fibrillation, chest pressure, and shortness of breath. She got her second COIVD vaccine last week and had originally attributed her symptoms to that. Shortness of breath started about 4 days ago. Symptoms were worse with lying flat and the patient was having trouble breathing at night. She would also have intermittent chest pressure that would occur with her SOB. She was feeling her afib more and rates at home were up into the 130s Also reported her weight went up a couple pounds. Patient denies missing doses of Eliquis.  In the ED blood pressure 110/76, pulse 109, afebrile, respiratory rate 16, 96% O2.  Edema and Rales noted on exam.  Labs showed sodium 136, potassium 3.3, glucose 112, creatinine 1.06.  WBC 6.3, hemoglobin 9.1.  BNP 1012.  HS troponin 16>12>10.  Covid negative.  EKG showed A. fib with chronic right bundle branch block, HR 97.  Chest x-ray showed cardiomegaly with diffuse bilateral interstitial opacities possibly mild pulmonary edema.  Patient was started on IV Lasix and admitted.  Echo showed low EF 35 to 40% cardiology was consulted.  Heart Pathway Score:     Past Medical History:  Diagnosis Date  .  Allergic rhinitis   . Anxiety   . Atrial fibrillation ([place])   . Barrett's esophagus   . Breast cancer ([place]) 2005   "left"  . Chronic lower back pain   . Chronic systolic CHF (congestive heart failure) (H[facility])    a. EF 30-35% felt to be possibly be tachy mediated from afib wtih RVR  . Depression   . Diverticulosis   . GERD (gastroesophageal reflux disease)   . Hiatal hernia   . Hx of radiation therapy 2005  . Hypertension   . Hypothyroidism   . Left atrial enlargement   . 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 Mitral regurgitation   . OAB (overactive bladder)   . Persistent atrial fibrillation ([place]) 10/31/2016   a. s/p failed DCCV x3. Now on amiodarone.   . 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 Stroke (cerebrum) ([place]) 11/13/2017  . Stroke [facility])     Past Surgical History:  Procedure Laterality Date  . BACK SURGERY    . BREAST BIOPSY Left 2005  . BREAST LUMPECTOMY Left 2005  . CARDIOVERSION N/A 11/02/2016   Procedure: CARDIOVERSION;  Surgeon: Thayer Headings, MD;  Location: [place];  Service: Cardiovascular;  Laterality: N/A;  . CARDIOVERSION N/A 11/16/2016   Procedure: CARDIOVERSION;  Surgeon: Fay Records, MD;  Location: [place];  Service: Cardiovascular;  Laterality: N/A;  . CARDIOVERSION N/A 12/24/2016   Procedure: CARDIOVERSION;  Surgeon: Sanda Klein, MD;  Location: [facility];  Service: Cardiovascular;  Laterality: N/A;  . CARPAL TUNNEL RELEASE Bilateral 1995  . CHOLECYSTECTOMY  08/2018  . COLONOSCOPY    . ELBOW FRACTURE SURGERY Right 2009   with implant  . ESOPHAGOGASTRODUODENOSCOPY    . ESOPHAGUS SURGERY     "in the process of getting cancerous cell off my esophagus" (10/31/2016)  . FRACTURE SURGERY    . IR CT HEAD LTD  11/14/2017  . IR PERCUTANEOUS ART THROMBECTOMY/INFUSION INTRACRANIAL INC DIAG ANGIO  11/14/2017  . LUMBAR DISC SURGERY     L5  . MOHS SURGERY     "corner of right eye; on left cheek"  . RADIOLOGY WITH  ANESTHESIA N/A 11/13/2017   Procedure: IR WITH ANESTHESIA;  Surgeon: Radiologist, Medication, MD;  Location: [place];  Service: Radiology;  Laterality: N/A;  . TEE WITHOUT CARDIOVERSION N/A 11/16/2016   Procedure: TRANSESOPHAGEAL ECHOCARDIOGRAM (TEE);  Surgeon: Fay Records, MD;  Location: [place];  Service: Cardiovascular;  Laterality: N/A;  . TONSILLECTOMY    . TUBAL LIGATION  1980     Home Medications:  Prior to Admission medications   Medication Sig Start Date End Date Taking? Authorizing Provider  acetaminophen (TYLENOL) 325 MG tablet Take 650 mg by mouth 2 (two) times daily as needed for mild pain.    Yes [provider]  amiodarone (PACERONE) 200 MG tablet Take 0.5 tablets (100 mg total) by mouth daily. 11/17/18  Yes Allred, Jeneen Rinks, MD  apixaban (ELIQUIS) 5 MG TABS tablet Take 1 tablet (5 mg total) by mouth 2 (two) times daily. 08/04/19  Yes Allred, Jeneen Rinks,  MD  cetirizine (ZYRTEC) 10 MG tablet Take 10 mg by mouth daily. Reported on 10/27/2015   Yes [provider]  Cholecalciferol (D 1000) 1000 units capsule Take 1,000 Units by mouth daily. Reported on 11/03/2015 11/30/14  Yes [provider]  diphenhydramine-acetaminophen (TYLENOL PM) 25-500 MG TABS tablet Take 1 tablet by mouth at bedtime as needed (sleep).   Yes [provider]  FLUoxetine (PROZAC) 40 MG capsule Take 40 mg by mouth daily.   Yes [provider]  fluticasone (FLONASE) 50 MCG/ACT nasal spray Place 1 spray into both nostrils daily as needed for allergies.  12/27/17  Yes [provider]  latanoprost (XALATAN) 0.005 % ophthalmic solution Place 1 drop into both eyes at bedtime. 11/12/19  Yes [provider]  levothyroxine (SYNTHROID) 175 MCG tablet Take 175 mcg by mouth daily before breakfast.    Yes [provider]  Magnesium Oxide (MAGOX 400 PO) Take 400 mg by mouth daily.    Yes [provider]  meclizine (ANTIVERT) 25 MG tablet Take 25 mg by mouth 3  (three) times daily as needed for dizziness or nausea.  06/02/18  Yes [provider]  Multiple Vitamins-Minerals (ICAPS) CAPS Take 1 capsule by mouth 2 (two) times daily.    Yes [provider]  omeprazole (PRILOSEC) 40 MG capsule Take 40 mg by mouth at bedtime.    Yes [provider]  ondansetron (ZOFRAN) 4 MG tablet Take 4 mg by mouth every 8 (eight) hours as needed for nausea or vomiting.  06/02/18  Yes [provider]  potassium chloride (KLOR-CON) 10 MEQ tablet Take 10 mEq by mouth daily.   Yes [provider]  Probiotic Product (DAILY PROBIOTIC) CAPS Take 1 tablet by mouth daily.   Yes [provider]  spironolactone (ALDACTONE) 50 MG tablet Take 50 mg by mouth daily as needed (ankle swelling).   Yes [provider]  traZODone (DESYREL) 150 MG tablet Take 75 mg by mouth at bedtime. 02/06/18  Yes [provider]    Inpatient Medications: Scheduled Meds: . amiodarone  100 mg Oral BID  . apixaban  5 mg Oral BID  . cholecalciferol  1,000 Units Oral Daily  . furosemide  40 mg Intravenous Daily  . latanoprost  1 drop Both Eyes QHS  . levothyroxine  175 mcg Oral Q0600  . metoprolol tartrate  12.5 mg Oral BID  . pantoprazole  80 mg Oral Daily  . traZODone  75 mg Oral QHS   Continuous Infusions:  PRN Meds: acetaminophen **OR** acetaminophen, promethazine, senna-docusate  Allergies:    Allergies  Allergen Reactions  . Benazepril Cough    Cough  . Diltiazem Hcl Rash    Social History:   Social History   Socioeconomic History  . Marital status: Married    Spouse name: Not on file  . Number of children: 2  . Years of education: Not on file  . Highest education level: Not on file  Occupational History  . Occupation: Retired    Fish farm manager: RETIRED  Tobacco Use  . Smoking status: Never Smoker  . Smokeless tobacco: Never Used  Substance and Sexual Activity  . Alcohol use: Yes    Alcohol/week: 0.0 standard  drinks    Comment: 1-2 per week  . Drug use: No  . Sexual activity: Not Currently  Other Topics Concern  . Not on file  Social History Narrative  . Not on file   Social Determinants of Health   Financial  Resource Strain:   . Difficulty of Paying Living Expenses: Not on file  Food Insecurity:   . Worried About Charity fundraiser in the Last Year: Not on file  . Ran Out of Food in the Last Year: Not on file  Transportation Needs:   . Lack of Transportation (Medical): Not on file  . Lack of Transportation (Non-Medical): Not on file  Physical Activity:   . Days of Exercise per Week: Not on file  . Minutes of Exercise per Session: Not on file  Stress:   . Feeling of Stress : Not on file  Social Connections:   . Frequency of Communication with Friends and Family: Not on file  . Frequency of Social Gatherings with Friends and Family: Not on file  . Attends Religious Services: Not on file  . Active Member of Clubs or Organizations: Not on file  . Attends Archivist Meetings: Not on file  . Marital Status: Not on file  Intimate Partner Violence:   . Fear of Current or Ex-Partner: Not on file  . Emotionally Abused: Not on file  . Physically Abused: Not on file  . Sexually Abused: Not on file    Family History:   Family History  Problem Relation Age of Onset  . Heart attack Father 95  . Congestive Heart Failure Father   . Thyroid cancer Sister   . Breast cancer Daughter   . Bone cancer Daughter        Ewing sarcoma  . Colon cancer Neg Hx   . Esophageal cancer Neg Hx   . Stomach cancer Neg Hx      ROS:  Please see the history of present illness.  All other ROS reviewed and negative.     Physical Exam/Data:   Vitals:   11/23/19 0454 11/23/19 0500 11/23/19 0600 11/23/19 0723  BP: 135/85   (!) 148/92  Pulse: 98   (!) 109  Resp: 20 (!) 22 (!) 23 18  Temp: 97.7 F (36.5 C)   97.7 F (36.5 C)  TempSrc: Oral   Oral  SpO2: 93%   96%  Weight: 60.3 kg       Height:        Intake/Output Summary (Last 24 hours) at 11/23/2019 1249 Last data filed at 11/23/2019 0800 Gross per 24 hour  Intake 600 ml  Output 1850 ml  Net -1250 ml   Last 3 Weights 11/23/2019 11/22/2019 06/16/2019  Weight (lbs) 132 lb 15 oz 135 lb 12.9 oz 134 lb 9.6 oz  Weight (kg) 60.3 kg 61.6 kg 61.054 kg     Body mass index is 25.12 kg/m.  General:  Well nourished, well developed, in no acute distress HEENT: normal Lymph: no adenopathy Neck: no JVD Endocrine:  No thryomegaly Vascular: No carotid bruits; FA pulses 2+ bilaterally without bruits  Cardiac:  normal S1, S2; Irreg Irreg; systolic murmur  Lungs:  clear to auscultation bilaterally, no wheezing, rhonchi or rales  Abd: soft, nontender, no hepatomegaly  Ext: no edema Musculoskeletal:  No deformities, BUE and BLE strength normal and equal Skin: warm and dry  Neuro:  CNs 2-12 intact, no focal abnormalities noted Psych:  Normal affect   EKG:  The EKG was personally reviewed and demonstrates: A. fib with RBBB, 97 bpm, nonspecific T wave changes Telemetry:  Telemetry was personally reviewed and demonstrates:  Afib with rates 100-120  Relevant CV Studies:  Echo 11/22/19 1. Left ventricular ejection fraction, by estimation, is 35 to 40%.  The  left ventricle has moderately decreased function. The left ventricle  demonstrates global hypokinesis. Left ventricular diastolic function could  not be evaluated.  2. Right ventricular systolic function is moderately reduced. The right  ventricular size is normal. There is mildly elevated pulmonary artery  systolic pressure. The estimated right ventricular systolic pressure is  [id number] mmHg.  3. Left atrial size was severely dilated.  4. Right atrial size was severely dilated.  5. The mitral valve is normal in structure and function. Mild to moderate  mitral valve regurgitation.  6. The aortic valve is tricuspid. Aortic valve regurgitation is moderate.  Mild to moderate  aortic valve sclerosis/calcification is present, without  any evidence of aortic stenosis.  7. The inferior vena cava is normal in size with <50% respiratory  variability, suggesting right atrial pressure of 8 mmHg.   Laboratory Data:  High Sensitivity Troponin:   Recent Labs  Lab 11/22/19 1122 11/22/19 1421 11/22/19 1547  TROPONINIHS 16 12 10      Chemistry Recent Labs  Lab 11/22/19 1009 11/23/19 0445  NA 136 140  K 3.3* 4.1  CL 102 104  CO2 23 24  GLUCOSE 112* 103*  BUN 13 14  CREATININE 1.06* 1.49*  CALCIUM 9.1 9.4  GFRNONAA 50* 33*  GFRAA 57* 38*  ANIONGAP 11 12    No results for input(s): PROT, ALBUMIN, AST, ALT, ALKPHOS, BILITOT in the last 168 hours. Hematology Recent Labs  Lab 11/22/19 1009 11/23/19 0445  WBC 6.3 8.5  RBC 4.00 4.13  HGB 9.1* 9.1*  HCT 30.6* 31.4*  MCV 76.5* 76.0*  MCH 22.8* 22.0*  MCHC 29.7* 29.0*  RDW 17.2* 17.4*  PLT 344 339   BNP Recent Labs  Lab 11/22/19 1122  BNP 1,012.5*    DDimer No results for input(s): DDIMER in the last 168 hours.   Radiology/Studies:  DG Chest Portable 1 View  Result Date: 11/22/2019 CLINICAL DATA:  Pt reports afib, heart racing, SOB, wheezing, cough, body aches, and nausea x 2 weeks. EXAM: PORTABLE CHEST 1 VIEW COMPARISON:  Chest radiograph 08/26/2018, 08/03/2018 FINDINGS: Stable cardiomediastinal contours with enlarged heart size. There are diffuse bilateral interstitial opacities favored to represent mild edema. Probable trace right pleural effusion. No evidence of pneumothorax. No acute finding in the visualized skeleton. IMPRESSION: Cardiomegaly with diffuse bilateral interstitial opacities favored to represent mild pulmonary edema. Probable trace right pleural effusion. Electronically Signed   By: Audie Pinto M.D.   On: 11/22/2019 10:53   ECHOCARDIOGRAM COMPLETE  Result Date: 11/22/2019    ECHOCARDIOGRAM REPORT   Patient Name:   Teresa York Date of Exam: 11/22/2019 Medical Rec #:  [id number]   Height:       62.5 in Accession #:    [id number] Weight:       134.6 lb Date of Birth:  1940-12-29 BSA:          1.63 m Patient Age:    68 years   BP:           144/73 mmHg Patient Gender: F          HR:           103 bpm. Exam Location:  Inpatient Procedure: 2D Echo, Cardiac Doppler and Color Doppler Indications:    Atrial Fibrillation 427.31  History:        Patient has prior history of Echocardiogram examinations, most                 recent 11/15/2017. CHF, Stroke,  Arrythmias:Atrial Fibrillation;                 Risk Factors:Hypertension and Non-Smoker. AI.  Sonographer:    Vickie Epley RDCS Referring Phys: [place]  1. Left ventricular ejection fraction, by estimation, is 35 to 40%. The left ventricle has moderately decreased function. The left ventricle demonstrates global hypokinesis. Left ventricular diastolic function could not be evaluated.  2. Right ventricular systolic function is moderately reduced. The right ventricular size is normal. There is mildly elevated pulmonary artery systolic pressure. The estimated right ventricular systolic pressure is A999333 mmHg.  3. Left atrial size was severely dilated.  4. Right atrial size was severely dilated.  5. The mitral valve is normal in structure and function. Mild to moderate mitral valve regurgitation.  6. The aortic valve is tricuspid. Aortic valve regurgitation is moderate. Mild to moderate aortic valve sclerosis/calcification is present, without any evidence of aortic stenosis.  7. The inferior vena cava is normal in size with <50% respiratory variability, suggesting right atrial pressure of 8 mmHg. Comparison(s): Prior images unable to be directly viewed, comparison made by report only. The left ventricular function is significantly worse. FINDINGS  Left Ventricle: Left ventricular ejection fraction, by estimation, is 35 to 40%. The left ventricle has moderately decreased function. The left ventricle demonstrates global hypokinesis. The  left ventricular internal cavity size was normal in size. There is no left ventricular hypertrophy. The left ventricular diastology could not be evaluated due to atrial fibrillation. Left ventricular diastolic function could not be evaluated. Right Ventricle: The right ventricular size is normal. No increase in right ventricular wall thickness. Right ventricular systolic function is moderately reduced. There is mildly elevated pulmonary artery systolic pressure. The tricuspid regurgitant velocity is 2.83 m/s, and with an assumed right atrial pressure of 3 mmHg, the estimated right ventricular systolic pressure is [id number] mmHg. Left Atrium: Left atrial size was severely dilated. Right Atrium: Right atrial size was severely dilated. Pericardium: A small pericardial effusion is present. The pericardial effusion is circumferential. There is no evidence of cardiac tamponade. Mitral Valve: The mitral valve is normal in structure and function. There is mild thickening of the mitral valve leaflet(s). Mild mitral annular calcification. Mild to moderate mitral valve regurgitation, with centrally-directed jet. Tricuspid Valve: The tricuspid valve is normal in structure. Tricuspid valve regurgitation is mild. Aortic Valve: The aortic valve is tricuspid. Aortic valve regurgitation is moderate. Aortic regurgitation PHT measures 336 msec. Mild to moderate aortic valve sclerosis/calcification is present, without any evidence of aortic stenosis. Pulmonic Valve: The pulmonic valve was normal in structure. Pulmonic valve regurgitation is trivial. Aorta: The aortic root and ascending aorta are structurally normal, with no evidence of dilitation. Venous: The inferior vena cava is normal in size with less than 50% respiratory variability, suggesting right atrial pressure of 8 mmHg. IAS/Shunts: There is right bowing of the interatrial septum, suggestive of elevated left atrial pressure. No atrial level shunt detected by color flow Doppler.   LEFT VENTRICLE PLAX 2D LVIDd:         5.30 cm LVIDs:         4.40 cm LV PW:         0.80 cm LV IVS:        0.80 cm LVOT diam:     2.00 cm LV SV:         43.67 ml LV SV Index:   28.86 LVOT Area:     3.14 cm  LV Volumes (MOD)  LV vol d, MOD A2C: 109.0 ml LV vol d, MOD A4C: 119.0 ml LV vol s, MOD A2C: 73.1 ml LV vol s, MOD A4C: 77.3 ml LV SV MOD A2C:     35.9 ml LV SV MOD A4C:     119.0 ml LV SV MOD BP:      39.7 ml RIGHT VENTRICLE RV S prime:     7.71 cm/s TAPSE (M-mode): 1.4 cm LEFT ATRIUM             Index       RIGHT ATRIUM           Index LA diam:        4.90 cm 3.01 cm/m  RA Area:     21.90 cm LA Vol (A2C):   81.9 ml 50.39 ml/m RA Volume:   65.80 ml  40.49 ml/m LA Vol (A4C):   86.3 ml 53.10 ml/m LA Biplane Vol: 91.8 ml 56.48 ml/m  AORTIC VALVE LVOT Vmax:   89.40 cm/s LVOT Vmean:  58.100 cm/s LVOT VTI:    0.139 m AI PHT:      336 msec  AORTA Ao Root diam: 2.70 cm MR Peak grad:    103.6 mmHg  TRICUSPID VALVE MR Mean grad:    69.0 mmHg   TR Peak grad:   32.0 mmHg MR Vmax:         509.00 cm/s TR Vmax:        283.00 cm/s MR Vmean:        395.0 cm/s MR PISA:         0.57 cm    SHUNTS MR PISA Eff ROA: 4 mm       Systemic VTI:  0.14 m MR PISA Radius:  0.30 cm     Systemic Diam: 2.00 cm Mihai Croitoru MD Electronically signed by Sanda Klein MD Signature Date/Time: 11/22/2019/3:47:45 PM    Final    {  Assessment and Plan:   H/o of 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 Follow-up EF 2019 showed EF 60-65% so no ischemic work-up was pursued. - Echo this admission EF 35-40% with global hypokinesis, moderately reduced RV function with RV systolic pressure 35 mmHg, mild ot mod MR - HS troponin trend flat and not consistent with ACS. EKG unremarkable although patient has had chest pressure although could be related to Afib RVR vs acute HF - Patient has not had ischemic eval in the past. Given chest  pressure on admission can consider cardiac cath however do not think patient would be a good candidate for DAPT given high fall risk. Will discuss with MD  Acute on chronic systolic and diastolic HF - EF in [id number]. EF this admission lower 35-40% - In the past patient had needed lasix for volume management but had not been needing it recently - BNP elevated to over 1000. CXR with pulmonary edema - Patient was started on IV lasix 40 mg daily.  - Overnight patient put out 1.8 L urine and -1.2L since admission - weight down 3 lbs - creatinine 1.06 > 1.49 >> hold lasix - Patient appears euvolemic on exam but she remains on 2-3 L O2.   Persistent Afib - On Eliquis 5 mg BID for stroke prophylaxis - She remains in afib with rates 100-120 - home amiodarone continued >>increase 100 mg twice daily - Had previously been on Toprol 12.5 mg. Will restart - Will  make NPO for possible DCCV tomorrow. Patient denies missing doses of Eliquis.  AKI - 1.06 > 1.49 - baseline around 1.2  Mild to moderate MR - per echo  H/o of stroke - continue Eliquis   For questions or updates, please contact [facility] Please consult [email or web address] for contact info under     Signed, Cadence Ninfa Meeker, PA-C  11/23/2019 12:49 PM   I have personally seen and examined this patient. I agree with the assessment and plan as outlined above. She is well known to me. 79 yo female with history of stroke, PAF, NICM and chronic diastolic CHF admitted with progressive dyspnea, chest pressure and rapid atrial fib.  She was felt to be volume overloaded and was diuresed. Her dyspnea and LE edema is improving with diuresis. She remains in atrial fibrillation with heart rates of 100-120 bpm.  She has missed no doses of Eliquis.  Echo with LVEF=35-40%. This had been normal in 2019 but had been depressed in the past when in atrial fib.  She notes frequent falls since she had the stroke.   My exam:  General: Well developed,  well nourished, NAD  HEENT: OP clear, mucus membranes moist  SKIN: warm, dry. No rashes. Neuro: No focal deficits  Musculoskeletal: Muscle strength 5/5 all ext  Psychiatric: Mood and affect normal  Neck: No JVD, no carotid bruits, no thyromegaly, no lymphadenopathy.  Lungs:Clear bilaterally, no wheezes, rhonci, crackles Cardiovascular: Irreg irreg. No murmurs, gallops or rubs. Abdomen:Soft. Bowel sounds present. Non-tender.  Extremities: No lower extremity edema. Pulses are 2 + in the bilateral DP/PT.  Plan: I think she her recent decompensation is driven by return to atrial fib with RVR, leading to the cardiomyopathy and diastolic CHF. Troponin negative. I do not think this is acute coronary syndrome. In the past, she has had similar decompensation when in atrial fib with RVR.  -She has been adequately diuresed. Would hold Lasix today as volume status is better, mild bump in creatinine.  -Double amiodarone to 100 mg po BID, add back Toprol 25 mg daily and make NPO at midnight for cardioversion tomorrow. She has not missed any doses of Eliquis.   I am hopeful that she will improve with diuresis and restoration of sinus rhythm.   Lauree Chandler 11/23/2019 12:57 PM

## 2019-11-23 NOTE — Progress Notes (Signed)
Patient refused CPAP for tonight. RT instructed patient to have RT called if she changes her mind. RT will monitor as needed. ?

## 2019-11-23 NOTE — Progress Notes (Addendum)
Subjective:  Per RT note:  Patient declined CPAP last night -- she has tried to wear it at home, but unable to tolerate.  0640am:  Spoke to nurse regarding overnight vitals (tachypnea), but nurse reported the readings are incorrect and she is resting comfortably in bed without SOB  0740am:  Day shift nurse paged and reported that patient states she has had difficulty catching her breath and has been coughing throughout the night.  Patient also endorses back pain with coughing.  Patient 96-97% on RA, but started patient on 2 L O2 this morning due to sx.  Informed nurse to give Lasix now.    On interview with patient:  Patient states states she began having CP at about 2am last night and felt SOB.  She denies nausea or sweating. Since being on O2, she feels better and has not SOB. Patient states she can usually tell when she goes into afib, and she believes the onset was 2 weeks ago after her 1st COVID vaccine.     Objective:  Vital signs in last 24 hours: Vitals:   11/23/19 0500 11/23/19 0600 11/23/19 0723 11/23/19 1250  BP:   (!) 148/92 120/66  Pulse:   (!) 109 (!) 101  Resp: (!) 22 (!) 23 18 19   Temp:   97.7 F (36.5 C) (!) 97.4 F (36.3 C)  TempSrc:   Oral Oral  SpO2:   96% 100%  Weight:      Height:       Weight change:   Intake/Output Summary (Last 24 hours) at 11/23/2019 1331 Last data filed at 11/23/2019 1300 Gross per 24 hour  Intake 600 ml  Output 2800 ml  Net -2200 ml   Physical Exam: Gen:  Elderly woman lying in bed in NAD with [place] in place HEENT:  Atraumatic, normocephalic Cardio:  Irregularly irregular rhythm, tachycardic, no murmurs, rubs, gallops, JVD 7cm above sternum Pulm:  CTAB, no wheezes, or crackles.  Breathing comfortably on 2 L [place] Extremities:  1+ LE edema bilaterally Neuro:  A&Ox3.  CNs grossly intact.    Labs: CBC Latest Ref Rng & Units 11/23/2019 11/22/2019 07/03/2019  WBC 4.0 - 10.5 K/uL 8.5 6.3 -  Hemoglobin 12.0 - 15.0 g/dL 9.1(L) 9.1(L) 10.1(L)   Hematocrit 36.0 - 46.0 % 31.4(L) 30.6(L) 33.1(L)  Platelets 150 - 400 K/uL 339 344 -   CMP Latest Ref Rng & Units 11/23/2019 11/22/2019 06/01/2019  Glucose 70 - 99 mg/dL 103(H) 112(H) 102(H)  BUN 8 - 23 mg/dL 14 13 12   Creatinine 0.44 - 1.00 mg/dL 1.49(H) 1.06(H) 1.06(H)  Sodium 135 - 145 mmol/L 140 136 137  Potassium 3.5 - 5.1 mmol/L 4.1 3.3(L) 3.6  Chloride 98 - 111 mmol/L 104 102 100  CO2 22 - 32 mmol/L 24 23 24   Calcium 8.9 - 10.3 mg/dL 9.4 9.1 9.3  Total Protein 6.0 - 8.5 g/dL - - 6.9  Total Bilirubin 0.0 - 1.2 mg/dL - - 0.5  Alkaline Phos 39 - 117 IU/L - - 97  AST 0 - 40 IU/L - - 44(H)  ALT 0 - 32 IU/L - - 20   TSH 2.258 on 2/15  Assessment/Plan:  Principal Problem:   Acute HFrEF (heart failure with reduced ejection fraction) (HCC) Active Problems:   Atrial fibrillation with RVR (HCC)   Chest tightness   Acute on chronic HFrEF (heart failure with reduced ejection fraction) ([place])   Ms. Kreuser is an 79 yo female with hx of Barrett's esophagus without dysplasia,  atrial fibrillation, combined systolic and diastolic CHF, HTN, hypothyroidism, stroke (2019) who presented to to the ED with worsening SOB and chest pain, and was admitted for acute heart failure with reduced ejection fraction.   #Acute HFrEF due to Atrial Fibrillation:   Patient with progressive SOB and CP over the past two weeks, with acute worsening for the past 2 days.  Pro-BNP significantly elevated 1,012 on admission.  She underwent unsuccessful electrical cardioversion in the past (11/02/16, 11/16/16, 12/24/16).  Patient does not take Lasix at home due to urinary incontinence, contributing to fluid overload.  Echo showed reduced EF 35-40% and LV global hypokinesis, and severe dilation of LA and RA.  Acute heart failure possibly caused by afib possibly.  ACS ruled out with high sensitivity troponin neg x2 and ECG with afib but no ischemic changes.  Will consult cardiology for ischemic workup and repeat ECG today given  ongoing CP.  Unlikely thyroid-induced, as TSH wnl.  Additionally, BP well controlled. -  Amiodarone 100 mg BID (home dose 100 mg qam) -  Holding further Lasix given increase in creatinine - Metoprolol tartrate 12.5 mg daily -  Continue home Eliquis 5 mg twice daily -  Strict Is/Os -  Daily Weights -  PT eval 2/14-- Ambulated 100 feet with a walker at a min guard assist level. SpO2 88-92% on RA, HR 108-122 (afib).  Recommendation 3x weekly.  Patient declined further inpatient PT follow-up.  No equipment recommendations.    AKI:  Cr 1.49 today 2/15, increased from 1.06 yesterday (~baseline).    Hypokalemia, resolved:  3.3 yesterday, repleted in ED--> 4.1 today 2/15.     -  Replete as needed  #Microcytic anemia:  Likely iron deficiency anemia with low Fe and low ferritin. H/H is stable from 5 months ago.   #Hx of stroke (Feb 2019) - Continue home Eliquis  #Hypothyroidism:  TSH wnl 2/14 -  Continue home levothyroxine 175 mcg   LOS: 0 days   Johny Blamer, Medical Student 11/23/2019, 1:31 PM

## 2019-11-23 NOTE — Progress Notes (Signed)
Initial Nutrition Assessment  RD working remotely.  DOCUMENTATION CODES:   Not applicable  INTERVENTION:   -MVI with minerals daily -Ensure Enlive po BID, each supplement provides 350 kcal and 20 grams of protein  NUTRITION DIAGNOSIS:   Increased nutrient needs related to chronic illness(CHF) as evidenced by estimated needs.  GOAL:   Patient will meet greater than or equal to 90% of their needs  MONITOR:   PO intake, Supplement acceptance, Labs, Weight trends, Skin, I & O's  REASON FOR ASSESSMENT:   Consult Assessment of nutrition requirement/status  ASSESSMENT:   Teresa York is an 79 yo female with hx of Barrett's esophagus without dysplasia, atrial fibrillation, combined systolic and diastolic CHF, HTN, hypothyroidism, stroke (2019) who presented to to the ED with worsening SOB and chest pain  Pt admitted with CHF.   Reviewed I/O's: -1.5 L x 24 hours  UOP: 1.9 L x 24 hours  Attempted to speak with pt via phone, however, no answer. RD unable to obtain further nutrition-related history at this time.   Per H&P, pt endorses a 100# wt loss over the past 2 years. Intake has been minimal over the past few weeks PTA, typically consuming only one meal per day (Breakfast of eggs toast, and juice). Intake appears to have improved since admission, consuming 50-100% of meals. Given history of oral intake and weight loss, pt would greatly benefit from addition of oral nutrition supplements.   Reviewed wt hx; pt has experienced a 4.7% wt loss over the past year, which is not significant for time frame.   Per MD notes, plan for cardioversion tomorrow.   Labs reviewed.  Diet Order:   Diet Order            Diet NPO time specified  Diet effective midnight        Diet NPO time specified  Diet effective midnight        Diet Heart Room service appropriate? Yes; Fluid consistency: Thin  Diet effective now              EDUCATION NEEDS:   No education needs have been  identified at this time  Skin:  Skin Assessment: Reviewed RN Assessment  Last BM:  11/22/19  Height:   Ht Readings from Last 1 Encounters:  11/22/19 5\' 1"  (1.549 m)    Weight:   Wt Readings from Last 1 Encounters:  11/23/19 60.3 kg    Ideal Body Weight:  47.7 kg  BMI:  Body mass index is 25.12 kg/m.  Estimated Nutritional Needs:   Kcal:  1600-1800  Protein:  75-90 grams  Fluid:  > 1.6 L    Loistine Chance, RD, LDN, [place] Registered Dietitian II Certified Diabetes Care and Education Specialist Please refer to [facility] for RD and/or RD on-call/weekend/after hours pager

## 2019-11-23 NOTE — Progress Notes (Signed)
Physical Therapy Treatment Patient Details Name: Teresa York MRN: [id number] DOB: [birth year] Today's Date: 11/23/2019    History of Present Illness Pt is a 79 y.o. F with significant PMH of stroke, afib, CHF, who presents with SOB, elevated HR, and hypokalemia. Received IV lasix in ED.     PT Comments    Pt was able to increase gait to 200' today with RW with min guard and cues for RW use.  She was on 2 LPM O2 with sats 98% with activity.  Cont POC.    Follow Up Recommendations  No PT follow up;Supervision/Assistance - 24 hour     Equipment Recommendations  None recommended by PT    Recommendations for Other Services       Precautions / Restrictions Precautions Precautions: Fall;Other (comment) Precaution Comments: watch HR    Mobility  Bed Mobility Overal bed mobility: Needs Assistance Bed Mobility: Supine to Sit     Supine to sit: Supervision;HOB elevated        Transfers Overall transfer level: Needs assistance Equipment used: None Transfers: Sit to/from Stand Sit to Stand: Supervision            Ambulation/Gait Ambulation/Gait assistance: Min guard Gait Distance (Feet): 200 Feet Assistive device: Rolling walker (2 wheeled) Gait Pattern/deviations: Step-through pattern;Decreased stride length Gait velocity: decreased   General Gait Details: cued for RW proximity   Stairs             Wheelchair Mobility    Modified Rankin (Stroke Patients Only)       Balance Overall balance assessment: Needs assistance Sitting-balance support: Feet supported Sitting balance-Leahy Scale: Good     Standing balance support: No upper extremity supported;During functional activity Standing balance-Leahy Scale: Fair                              Cognition Arousal/Alertness: Awake/alert Behavior During Therapy: WFL for tasks assessed/performed Overall Cognitive Status: Within Functional Limits for tasks assessed                                         Exercises      General Comments General comments (skin integrity, edema, etc.): Pt on 2 LPM O2 with sats 98% or greater.  HR up to 109 bpm with walking.      Pertinent Vitals/Pain Pain Assessment: No/denies pain    Home Living                      Prior Function            PT Goals (current goals can now be found in the care plan section) Progress towards PT goals: Progressing toward goals    Frequency    Min 3X/week      PT Plan      Co-evaluation              AM-PAC PT "6 Clicks" Mobility   Outcome Measure  Help needed turning from your back to your side while in a flat bed without using bedrails?: None Help needed moving from lying on your back to sitting on the side of a flat bed without using bedrails?: None Help needed moving to and from a bed to a chair (including a wheelchair)?: None Help needed standing up from a chair using your arms (e.g., wheelchair or bedside  chair)?: None Help needed to walk in hospital room?: None Help needed climbing 3-5 steps with a railing? : A Little 6 Click Score: 23    End of Session Equipment Utilized During Treatment: Gait belt Activity Tolerance: Patient tolerated treatment well Patient left: in bed;with call bell/phone within reach;with bed alarm set;with family/visitor present Nurse Communication: Mobility status PT Visit Diagnosis: Unsteadiness on feet (R26.81);Muscle weakness (generalized) (M62.81);Difficulty in walking, not elsewhere classified (R26.2)     Time: [id number] PT Time Calculation (min) (ACUTE ONLY): 20 min  Charges:  $Gait Training: 8-22 mins                     Maggie Font, PT Acute Rehab Services Pager [phone] [facility] [phone] [facility] [phone]    Teresa York 11/23/2019, 11:45 AM

## 2019-11-23 NOTE — H&P (View-Only) (Signed)
Cardiology Consultation:   Patient ID: Teresa York MRN: [id number]; DOB: 11-22-1940  Admit date: 11/22/2019 Date of Consult: 11/23/2019  Primary Care Provider: Patrecia Pour, Christean Grief, MD Primary Cardiologist: Lauree Chandler, MD  Primary Electrophysiologist:  Thompson Grayer, MD    Patient Profile:   Teresa York is a 79 y.o. female with a hx of hypertension, prior stroke 11/2017, Barrett's esophagus, GERD, chronic back pain, breast cancer (left s/p lumpectomy), hypothyroidism, OSA with CPAP, and NICM (suspected secondary to RVR) with recovered EF, chronic CHF (EF 60-65% 2019), and persistent A. Fib s/p multiple cardioversions who is being seen today for the evaluation of decreased EF and A. fib at the request of Dr. Evette Doffing.  History of Present Illness:   Teresa York is followed by Dr. Rayann Heman. She had been seen by Dr.Mcalhaney in the past.  Patient has a history of nonischemic cardiomyopathy with recovered EF. The patient was diagnosed with Afib in 10/2013 by her PCP on a regular office visit. She was started on Xarelto and Toprol. Echo 12/2013 showed normal EF, mild to mod AI, LAE. In 10/2016 she was seen in the office and found to be in A. fib with RVR and acute CHF. She was admitted to the hospital diuresed. Echo at that time showed EF significantly decreased to 30 to 35%. This was felt to be secondary to A. fib. Patient underwent successful cardioversion. Patient was seen in follow-up and noted to be in A. fib with RVR and acute heart failure and she was readmitted.  Amiodarone was added to her regimen and the patient was diuresed with IV lasix. Repeat TEE /DCCV was initially successful but eventually returned to A. fib /flutter.  ACE/ARB were not added due to soft pressures and renal insufficiency.  In hospital follow-up the patient was much better rate controlled with amiodarone and toprol. She was set up for outpatient cardioversion with was initially successful but at her follow-up 12/2016 she was  noted to be back in A. Fib and she was referred to EP. She had a stroke 11/2017 while off Xarelto after GIB. During the hospitalization she was started on Eliquis and Aspirin.  Follow-up echo 11/2017 showed 65%, no wall motion abnormalities, moderate AR, dilated dilated right atrium, mild TR.   The patient was last seen 06/01/2019 by Renee. Since her stroke the patient had multiple mechanical falls. Patient and her husband were concerned given her need for anticoagulation. Patient had started PT and OT. From a cardiac perspective the patient was stable. Patient remained on Eliquis 5 mg twice daily and amiodarone 100 mg daily for A. Fib. Patient had previously been taking Metoprolol 12.5 mg daily but this was discontinued due to bradycardia.  The patient presented to the ED 11/22/2019 for atrial fibrillation, chest pressure, and shortness of breath. She got her second COIVD vaccine last week and had originally attributed her symptoms to that. Shortness of breath started about 4 days ago. Symptoms were worse with lying flat and the patient was having trouble breathing at night. She would also have intermittent chest pressure that would occur with her SOB. She was feeling her afib more and rates at home were up into the 130s Also reported her weight went up a couple pounds. Patient denies missing doses of Eliquis.  In the ED blood pressure 110/76, pulse 109, afebrile, respiratory rate 16, 96% O2.  Edema and Rales noted on exam.  Labs showed sodium 136, potassium 3.3, glucose 112, creatinine 1.06.  WBC 6.3, hemoglobin 9.1.  BNP 1012.  HS troponin 16>12>10.  Covid negative.  EKG showed A. fib with chronic right bundle branch block, HR 97.  Chest x-ray showed cardiomegaly with diffuse bilateral interstitial opacities possibly mild pulmonary edema.  Patient was started on IV Lasix and admitted.  Echo showed low EF 35 to 40% cardiology was consulted.  Heart Pathway Score:     Past Medical History:  Diagnosis Date  .  Allergic rhinitis   . Anxiety   . Atrial fibrillation ([place])   . Barrett's esophagus   . Breast cancer ([place]) 2005   "left"  . Chronic lower back pain   . Chronic systolic CHF (congestive heart failure) (HCC)    a. EF 30-35% felt to be possibly be tachy mediated from afib wtih RVR  . Depression   . Diverticulosis   . GERD (gastroesophageal reflux disease)   . Hiatal hernia   . Hx of radiation therapy 2005  . Hypertension   . Hypothyroidism   . Left atrial enlargement   . 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 Mitral regurgitation   . OAB (overactive bladder)   . Persistent atrial fibrillation ([place]) 10/31/2016   a. s/p failed DCCV x3. Now on amiodarone.   . 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 Stroke (cerebrum) ([place]) 11/13/2017  . Stroke [facility])     Past Surgical History:  Procedure Laterality Date  . BACK SURGERY    . BREAST BIOPSY Left 2005  . BREAST LUMPECTOMY Left 2005  . CARDIOVERSION N/A 11/02/2016   Procedure: CARDIOVERSION;  Surgeon: Thayer Headings, MD;  Location: [place];  Service: Cardiovascular;  Laterality: N/A;  . CARDIOVERSION N/A 11/16/2016   Procedure: CARDIOVERSION;  Surgeon: Fay Records, MD;  Location: [address];  Service: Cardiovascular;  Laterality: N/A;  . CARDIOVERSION N/A 12/24/2016   Procedure: CARDIOVERSION;  Surgeon: Sanda Klein, MD;  Location: [facility];  Service: Cardiovascular;  Laterality: N/A;  . CARPAL TUNNEL RELEASE Bilateral 1995  . CHOLECYSTECTOMY  08/2018  . COLONOSCOPY    . ELBOW FRACTURE SURGERY Right 2009   with implant  . ESOPHAGOGASTRODUODENOSCOPY    . ESOPHAGUS SURGERY     "in the process of getting cancerous cell off my esophagus" (10/31/2016)  . FRACTURE SURGERY    . IR CT HEAD LTD  11/14/2017  . IR PERCUTANEOUS ART THROMBECTOMY/INFUSION INTRACRANIAL INC DIAG ANGIO  11/14/2017  . LUMBAR DISC SURGERY     L5  . MOHS SURGERY     "corner of right eye; on left cheek"  . RADIOLOGY WITH  ANESTHESIA N/A 11/13/2017   Procedure: IR WITH ANESTHESIA;  Surgeon: Radiologist, Medication, MD;  Location: [place];  Service: Radiology;  Laterality: N/A;  . TEE WITHOUT CARDIOVERSION N/A 11/16/2016   Procedure: TRANSESOPHAGEAL ECHOCARDIOGRAM (TEE);  Surgeon: Fay Records, MD;  Location: [place];  Service: Cardiovascular;  Laterality: N/A;  . TONSILLECTOMY    . TUBAL LIGATION  1980     Home Medications:  Prior to Admission medications   Medication Sig Start Date End Date Taking? Authorizing Provider  acetaminophen (TYLENOL) 325 MG tablet Take 650 mg by mouth 2 (two) times daily as needed for mild pain.    Yes [provider]  amiodarone (PACERONE) 200 MG tablet Take 0.5 tablets (100 mg total) by mouth daily. 11/17/18  Yes Allred, Jeneen Rinks, MD  apixaban (ELIQUIS) 5 MG TABS tablet Take 1 tablet (5 mg total) by mouth 2 (two) times daily. 08/04/19  Yes Allred, Jeneen Rinks,  MD  cetirizine (ZYRTEC) 10 MG tablet Take 10 mg by mouth daily. Reported on 10/27/2015   Yes [provider]  Cholecalciferol (D 1000) 1000 units capsule Take 1,000 Units by mouth daily. Reported on 11/03/2015 11/30/14  Yes [provider]  diphenhydramine-acetaminophen (TYLENOL PM) 25-500 MG TABS tablet Take 1 tablet by mouth at bedtime as needed (sleep).   Yes [provider]  FLUoxetine (PROZAC) 40 MG capsule Take 40 mg by mouth daily.   Yes [provider]  fluticasone (FLONASE) 50 MCG/ACT nasal spray Place 1 spray into both nostrils daily as needed for allergies.  12/27/17  Yes [provider]  latanoprost (XALATAN) 0.005 % ophthalmic solution Place 1 drop into both eyes at bedtime. 11/12/19  Yes [provider]  levothyroxine (SYNTHROID) 175 MCG tablet Take 175 mcg by mouth daily before breakfast.    Yes [provider]  Magnesium Oxide (MAGOX 400 PO) Take 400 mg by mouth daily.    Yes [provider]  meclizine (ANTIVERT) 25 MG tablet Take 25 mg by mouth 3  (three) times daily as needed for dizziness or nausea.  06/02/18  Yes [provider]  Multiple Vitamins-Minerals (ICAPS) CAPS Take 1 capsule by mouth 2 (two) times daily.    Yes [provider]  omeprazole (PRILOSEC) 40 MG capsule Take 40 mg by mouth at bedtime.    Yes [provider]  ondansetron (ZOFRAN) 4 MG tablet Take 4 mg by mouth every 8 (eight) hours as needed for nausea or vomiting.  06/02/18  Yes [provider]  potassium chloride (KLOR-CON) 10 MEQ tablet Take 10 mEq by mouth daily.   Yes [provider]  Probiotic Product (DAILY PROBIOTIC) CAPS Take 1 tablet by mouth daily.   Yes [provider]  spironolactone (ALDACTONE) 50 MG tablet Take 50 mg by mouth daily as needed (ankle swelling).   Yes [provider]  traZODone (DESYREL) 150 MG tablet Take 75 mg by mouth at bedtime. 02/06/18  Yes [provider]    Inpatient Medications: Scheduled Meds: . amiodarone  100 mg Oral BID  . apixaban  5 mg Oral BID  . cholecalciferol  1,000 Units Oral Daily  . furosemide  40 mg Intravenous Daily  . latanoprost  1 drop Both Eyes QHS  . levothyroxine  175 mcg Oral Q0600  . metoprolol tartrate  12.5 mg Oral BID  . pantoprazole  80 mg Oral Daily  . traZODone  75 mg Oral QHS   Continuous Infusions:  PRN Meds: acetaminophen **OR** acetaminophen, promethazine, senna-docusate  Allergies:    Allergies  Allergen Reactions  . Benazepril Cough    Cough  . Diltiazem Hcl Rash    Social History:   Social History   Socioeconomic History  . Marital status: Married    Spouse name: Not on file  . Number of children: 2  . Years of education: Not on file  . Highest education level: Not on file  Occupational History  . Occupation: Retired    Fish farm manager: RETIRED  Tobacco Use  . Smoking status: Never Smoker  . Smokeless tobacco: Never Used  Substance and Sexual Activity  . Alcohol use: Yes    Alcohol/week: 0.0 standard  drinks    Comment: 1-2 per week  . Drug use: No  . Sexual activity: Not Currently  Other Topics Concern  . Not on file  Social History Narrative  . Not on file   Social Determinants of Health   Financial  Resource Strain:   . Difficulty of Paying Living Expenses: Not on file  Food Insecurity:   . Worried About Charity fundraiser in the Last Year: Not on file  . Ran Out of Food in the Last Year: Not on file  Transportation Needs:   . Lack of Transportation (Medical): Not on file  . Lack of Transportation (Non-Medical): Not on file  Physical Activity:   . Days of Exercise per Week: Not on file  . Minutes of Exercise per Session: Not on file  Stress:   . Feeling of Stress : Not on file  Social Connections:   . Frequency of Communication with Friends and Family: Not on file  . Frequency of Social Gatherings with Friends and Family: Not on file  . Attends Religious Services: Not on file  . Active Member of Clubs or Organizations: Not on file  . Attends Archivist Meetings: Not on file  . Marital Status: Not on file  Intimate Partner Violence:   . Fear of Current or Ex-Partner: Not on file  . Emotionally Abused: Not on file  . Physically Abused: Not on file  . Sexually Abused: Not on file    Family History:   Family History  Problem Relation Age of Onset  . Heart attack Father 95  . Congestive Heart Failure Father   . Thyroid cancer Sister   . Breast cancer Daughter   . Bone cancer Daughter        Ewing sarcoma  . Colon cancer Neg Hx   . Esophageal cancer Neg Hx   . Stomach cancer Neg Hx      ROS:  Please see the history of present illness.  All other ROS reviewed and negative.     Physical Exam/Data:   Vitals:   11/23/19 0454 11/23/19 0500 11/23/19 0600 11/23/19 0723  BP: 135/85   (!) 148/92  Pulse: 98   (!) 109  Resp: 20 (!) 22 (!) 23 18  Temp: 97.7 F (36.5 C)   97.7 F (36.5 C)  TempSrc: Oral   Oral  SpO2: 93%   96%  Weight: 60.3 kg       Height:        Intake/Output Summary (Last 24 hours) at 11/23/2019 1249 Last data filed at 11/23/2019 0800 Gross per 24 hour  Intake 600 ml  Output 1850 ml  Net -1250 ml   Last 3 Weights 11/23/2019 11/22/2019 06/16/2019  Weight (lbs) 132 lb 15 oz 135 lb 12.9 oz 134 lb 9.6 oz  Weight (kg) 60.3 kg 61.6 kg 61.054 kg     Body mass index is 25.12 kg/m.  General:  Well nourished, well developed, in no acute distress HEENT: normal Lymph: no adenopathy Neck: no JVD Endocrine:  No thryomegaly Vascular: No carotid bruits; FA pulses 2+ bilaterally without bruits  Cardiac:  normal S1, S2; Irreg Irreg; systolic murmur  Lungs:  clear to auscultation bilaterally, no wheezing, rhonchi or rales  Abd: soft, nontender, no hepatomegaly  Ext: no edema Musculoskeletal:  No deformities, BUE and BLE strength normal and equal Skin: warm and dry  Neuro:  CNs 2-12 intact, no focal abnormalities noted Psych:  Normal affect   EKG:  The EKG was personally reviewed and demonstrates: A. fib with RBBB, 97 bpm, nonspecific T wave changes Telemetry:  Telemetry was personally reviewed and demonstrates:  Afib with rates 100-120  Relevant CV Studies:  Echo 11/22/19 1. Left ventricular ejection fraction, by estimation, is 35 to 40%.  The  left ventricle has moderately decreased function. The left ventricle  demonstrates global hypokinesis. Left ventricular diastolic function could  not be evaluated.  2. Right ventricular systolic function is moderately reduced. The right  ventricular size is normal. There is mildly elevated pulmonary artery  systolic pressure. The estimated right ventricular systolic pressure is  [id number] mmHg.  3. Left atrial size was severely dilated.  4. Right atrial size was severely dilated.  5. The mitral valve is normal in structure and function. Mild to moderate  mitral valve regurgitation.  6. The aortic valve is tricuspid. Aortic valve regurgitation is moderate.  Mild to moderate  aortic valve sclerosis/calcification is present, without  any evidence of aortic stenosis.  7. The inferior vena cava is normal in size with <50% respiratory  variability, suggesting right atrial pressure of 8 mmHg.   Laboratory Data:  High Sensitivity Troponin:   Recent Labs  Lab 11/22/19 1122 11/22/19 1421 11/22/19 1547  TROPONINIHS 16 12 10      Chemistry Recent Labs  Lab 11/22/19 1009 11/23/19 0445  NA 136 140  K 3.3* 4.1  CL 102 104  CO2 23 24  GLUCOSE 112* 103*  BUN 13 14  CREATININE 1.06* 1.49*  CALCIUM 9.1 9.4  GFRNONAA 50* 33*  GFRAA 57* 38*  ANIONGAP 11 12    No results for input(s): PROT, ALBUMIN, AST, ALT, ALKPHOS, BILITOT in the last 168 hours. Hematology Recent Labs  Lab 11/22/19 1009 11/23/19 0445  WBC 6.3 8.5  RBC 4.00 4.13  HGB 9.1* 9.1*  HCT 30.6* 31.4*  MCV 76.5* 76.0*  MCH 22.8* 22.0*  MCHC 29.7* 29.0*  RDW 17.2* 17.4*  PLT 344 339   BNP Recent Labs  Lab 11/22/19 1122  BNP 1,012.5*    DDimer No results for input(s): DDIMER in the last 168 hours.   Radiology/Studies:  DG Chest Portable 1 View  Result Date: 11/22/2019 CLINICAL DATA:  Pt reports afib, heart racing, SOB, wheezing, cough, body aches, and nausea x 2 weeks. EXAM: PORTABLE CHEST 1 VIEW COMPARISON:  Chest radiograph 08/26/2018, 08/03/2018 FINDINGS: Stable cardiomediastinal contours with enlarged heart size. There are diffuse bilateral interstitial opacities favored to represent mild edema. Probable trace right pleural effusion. No evidence of pneumothorax. No acute finding in the visualized skeleton. IMPRESSION: Cardiomegaly with diffuse bilateral interstitial opacities favored to represent mild pulmonary edema. Probable trace right pleural effusion. Electronically Signed   By: Audie Pinto M.D.   On: 11/22/2019 10:53   ECHOCARDIOGRAM COMPLETE  Result Date: 11/22/2019    ECHOCARDIOGRAM REPORT   Patient Name:   Teresa York Date of Exam: 11/22/2019 Medical Rec #:  [id number]   Height:       62.5 in Accession #:    [id number] Weight:       134.6 lb Date of Birth:  December 26, 1940 BSA:          1.63 m Patient Age:    38 years   BP:           144/73 mmHg Patient Gender: F          HR:           103 bpm. Exam Location:  Inpatient Procedure: 2D Echo, Cardiac Doppler and Color Doppler Indications:    Atrial Fibrillation 427.31  History:        Patient has prior history of Echocardiogram examinations, most                 recent 11/15/2017. CHF, Stroke,  Arrythmias:Atrial Fibrillation;                 Risk Factors:Hypertension and Non-Smoker. AI.  Sonographer:    Vickie Epley RDCS Referring Phys: [place]  1. Left ventricular ejection fraction, by estimation, is 35 to 40%. The left ventricle has moderately decreased function. The left ventricle demonstrates global hypokinesis. Left ventricular diastolic function could not be evaluated.  2. Right ventricular systolic function is moderately reduced. The right ventricular size is normal. There is mildly elevated pulmonary artery systolic pressure. The estimated right ventricular systolic pressure is A999333 mmHg.  3. Left atrial size was severely dilated.  4. Right atrial size was severely dilated.  5. The mitral valve is normal in structure and function. Mild to moderate mitral valve regurgitation.  6. The aortic valve is tricuspid. Aortic valve regurgitation is moderate. Mild to moderate aortic valve sclerosis/calcification is present, without any evidence of aortic stenosis.  7. The inferior vena cava is normal in size with <50% respiratory variability, suggesting right atrial pressure of 8 mmHg. Comparison(s): Prior images unable to be directly viewed, comparison made by report only. The left ventricular function is significantly worse. FINDINGS  Left Ventricle: Left ventricular ejection fraction, by estimation, is 35 to 40%. The left ventricle has moderately decreased function. The left ventricle demonstrates global hypokinesis. The  left ventricular internal cavity size was normal in size. There is no left ventricular hypertrophy. The left ventricular diastology could not be evaluated due to atrial fibrillation. Left ventricular diastolic function could not be evaluated. Right Ventricle: The right ventricular size is normal. No increase in right ventricular wall thickness. Right ventricular systolic function is moderately reduced. There is mildly elevated pulmonary artery systolic pressure. The tricuspid regurgitant velocity is 2.83 m/s, and with an assumed right atrial pressure of 3 mmHg, the estimated right ventricular systolic pressure is [id number] mmHg. Left Atrium: Left atrial size was severely dilated. Right Atrium: Right atrial size was severely dilated. Pericardium: A small pericardial effusion is present. The pericardial effusion is circumferential. There is no evidence of cardiac tamponade. Mitral Valve: The mitral valve is normal in structure and function. There is mild thickening of the mitral valve leaflet(s). Mild mitral annular calcification. Mild to moderate mitral valve regurgitation, with centrally-directed jet. Tricuspid Valve: The tricuspid valve is normal in structure. Tricuspid valve regurgitation is mild. Aortic Valve: The aortic valve is tricuspid. Aortic valve regurgitation is moderate. Aortic regurgitation PHT measures 336 msec. Mild to moderate aortic valve sclerosis/calcification is present, without any evidence of aortic stenosis. Pulmonic Valve: The pulmonic valve was normal in structure. Pulmonic valve regurgitation is trivial. Aorta: The aortic root and ascending aorta are structurally normal, with no evidence of dilitation. Venous: The inferior vena cava is normal in size with less than 50% respiratory variability, suggesting right atrial pressure of 8 mmHg. IAS/Shunts: There is right bowing of the interatrial septum, suggestive of elevated left atrial pressure. No atrial level shunt detected by color flow Doppler.   LEFT VENTRICLE PLAX 2D LVIDd:         5.30 cm LVIDs:         4.40 cm LV PW:         0.80 cm LV IVS:        0.80 cm LVOT diam:     2.00 cm LV SV:         43.67 ml LV SV Index:   28.86 LVOT Area:     3.14 cm  LV Volumes (MOD)  LV vol d, MOD A2C: 109.0 ml LV vol d, MOD A4C: 119.0 ml LV vol s, MOD A2C: 73.1 ml LV vol s, MOD A4C: 77.3 ml LV SV MOD A2C:     35.9 ml LV SV MOD A4C:     119.0 ml LV SV MOD BP:      39.7 ml RIGHT VENTRICLE RV S prime:     7.71 cm/s TAPSE (M-mode): 1.4 cm LEFT ATRIUM             Index       RIGHT ATRIUM           Index LA diam:        4.90 cm 3.01 cm/m  RA Area:     21.90 cm LA Vol (A2C):   81.9 ml 50.39 ml/m RA Volume:   65.80 ml  40.49 ml/m LA Vol (A4C):   86.3 ml 53.10 ml/m LA Biplane Vol: 91.8 ml 56.48 ml/m  AORTIC VALVE LVOT Vmax:   89.40 cm/s LVOT Vmean:  58.100 cm/s LVOT VTI:    0.139 m AI PHT:      336 msec  AORTA Ao Root diam: 2.70 cm MR Peak grad:    103.6 mmHg  TRICUSPID VALVE MR Mean grad:    69.0 mmHg   TR Peak grad:   32.0 mmHg MR Vmax:         509.00 cm/s TR Vmax:        283.00 cm/s MR Vmean:        395.0 cm/s MR PISA:         0.57 cm    SHUNTS MR PISA Eff ROA: 4 mm       Systemic VTI:  0.14 m MR PISA Radius:  0.30 cm     Systemic Diam: 2.00 cm Mihai Croitoru MD Electronically signed by Sanda Klein MD Signature Date/Time: 11/22/2019/3:47:45 PM    Final    {  Assessment and Plan:   H/o of 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 Follow-up EF 2019 showed EF 60-65% so no ischemic work-up was pursued. - Echo this admission EF 35-40% with global hypokinesis, moderately reduced RV function with RV systolic pressure 35 mmHg, mild ot mod MR - HS troponin trend flat and not consistent with ACS. EKG unremarkable although patient has had chest pressure although could be related to Afib RVR vs acute HF - Patient has not had ischemic eval in the past. Given chest  pressure on admission can consider cardiac cath however do not think patient would be a good candidate for DAPT given high fall risk. Will discuss with MD  Acute on chronic systolic and diastolic HF - EF in [id number]. EF this admission lower 35-40% - In the past patient had needed lasix for volume management but had not been needing it recently - BNP elevated to over 1000. CXR with pulmonary edema - Patient was started on IV lasix 40 mg daily.  - Overnight patient put out 1.8 L urine and -1.2L since admission - weight down 3 lbs - creatinine 1.06 > 1.49 >> hold lasix - Patient appears euvolemic on exam but she remains on 2-3 L O2.   Persistent Afib - On Eliquis 5 mg BID for stroke prophylaxis - She remains in afib with rates 100-120 - home amiodarone continued >>increase 100 mg twice daily - Had previously been on Toprol 12.5 mg. Will restart - Will  make NPO for possible DCCV tomorrow. Patient denies missing doses of Eliquis.  AKI - 1.06 > 1.49 - baseline around 1.2  Mild to moderate MR - per echo  H/o of stroke - continue Eliquis   For questions or updates, please contact [facility] Please consult [email or web address] for contact info under     Signed, Cadence Ninfa Meeker, PA-C  11/23/2019 12:49 PM   I have personally seen and examined this patient. I agree with the assessment and plan as outlined above. She is well known to me. 79 yo female with history of stroke, PAF, NICM and chronic diastolic CHF admitted with progressive dyspnea, chest pressure and rapid atrial fib.  She was felt to be volume overloaded and was diuresed. Her dyspnea and LE edema is improving with diuresis. She remains in atrial fibrillation with heart rates of 100-120 bpm.  She has missed no doses of Eliquis.  Echo with LVEF=35-40%. This had been normal in 2019 but had been depressed in the past when in atrial fib.  She notes frequent falls since she had the stroke.   My exam:  General: Well developed,  well nourished, NAD  HEENT: OP clear, mucus membranes moist  SKIN: warm, dry. No rashes. Neuro: No focal deficits  Musculoskeletal: Muscle strength 5/5 all ext  Psychiatric: Mood and affect normal  Neck: No JVD, no carotid bruits, no thyromegaly, no lymphadenopathy.  Lungs:Clear bilaterally, no wheezes, rhonci, crackles Cardiovascular: Irreg irreg. No murmurs, gallops or rubs. Abdomen:Soft. Bowel sounds present. Non-tender.  Extremities: No lower extremity edema. Pulses are 2 + in the bilateral DP/PT.  Plan: I think she her recent decompensation is driven by return to atrial fib with RVR, leading to the cardiomyopathy and diastolic CHF. Troponin negative. I do not think this is acute coronary syndrome. In the past, she has had similar decompensation when in atrial fib with RVR.  -She has been adequately diuresed. Would hold Lasix today as volume status is better, mild bump in creatinine.  -Double amiodarone to 100 mg po BID, add back Toprol 25 mg daily and make NPO at midnight for cardioversion tomorrow. She has not missed any doses of Eliquis.   I am hopeful that she will improve with diuresis and restoration of sinus rhythm.   Lauree Chandler 11/23/2019 12:57 PM

## 2019-11-23 NOTE — Progress Notes (Signed)
Patient c/o SOB unable to catch her breath, denies chest pain pt. Stated she has been coughing since 3 am, see epic for current v/s, place patient on 2L oxygen, 40 mg lasix given per order. MD notified will continue to monitor patient.

## 2019-11-23 NOTE — Progress Notes (Signed)
OT Cancellation Note  Patient Details Name: Teresa York MRN: [id number] DOB: January 27, 1941   Cancelled Treatment:    Reason Eval/Treat Not Completed: Patient declined, no reason specified(Pt reporting chest tightness and congestion.) Pt politely declining as she was finishing her breakfast and overall not feeling well. Pt education of OOB tasks and sitting upright in chair to assist with chest discomfort, but pt continued to ask for OT to come at a later time. OT to continue to follow for OT eval at a later time.  Jefferey Pica, OTR/L Acute Rehabilitation Services Pager: [phone] Office: [place] 11/23/2019, 8:10 AM

## 2019-11-23 NOTE — Plan of Care (Signed)
  Problem: Clinical Measurements: Goal: Ability to maintain clinical measurements within normal limits will improve Outcome: Progressing   Problem: Clinical Measurements: Goal: Cardiovascular complication will be avoided Outcome: Progressing   Problem: Coping: Goal: Level of anxiety will decrease Outcome: Progressing   

## 2019-11-24 ENCOUNTER — Encounter (HOSPITAL_COMMUNITY): Payer: Self-pay | Admitting: Student in an Organized Health Care Education/Training Program

## 2019-11-24 ENCOUNTER — Telehealth: Payer: PPO | Admitting: Internal Medicine

## 2019-11-24 ENCOUNTER — Inpatient Hospital Stay (HOSPITAL_COMMUNITY): Payer: PPO | Admitting: Anesthesiology

## 2019-11-24 ENCOUNTER — Encounter (HOSPITAL_COMMUNITY)
Admission: EM | Disposition: A | Discharge status: Home Health | Payer: Self-pay | Source: Home / Self Care | Attending: Internal Medicine

## 2019-11-24 ENCOUNTER — Inpatient Hospital Stay (HOSPITAL_COMMUNITY): Payer: PPO

## 2019-11-24 DIAGNOSIS — I4891 Unspecified atrial fibrillation: Secondary | ICD-10-CM | POA: Diagnosis present

## 2019-11-24 DIAGNOSIS — N183 Chronic kidney disease, stage 3 unspecified: Secondary | ICD-10-CM | POA: Diagnosis present

## 2019-11-24 HISTORY — PX: CARDIOVERSION: SHX1299

## 2019-11-24 LAB — BLOOD GAS, ARTERIAL
Acid-base deficit: 0.1 mmol/L (ref 0.0–2.0)
Bicarbonate: 23.1 mmol/L (ref 20.0–28.0)
Drawn by: 535271
FIO2: 28
O2 Saturation: 98.2 %
Patient temperature: 36.6
pCO2 arterial: 31.7 mmHg — ABNORMAL LOW (ref 32.0–48.0)
pH, Arterial: 7.474 — ABNORMAL HIGH (ref 7.350–7.450)
pO2, Arterial: 100 mmHg (ref 83.0–108.0)

## 2019-11-24 LAB — COMPREHENSIVE METABOLIC PANEL
ALT: 16 U/L (ref 0–44)
AST: 31 U/L (ref 15–41)
Albumin: 3 g/dL — ABNORMAL LOW (ref 3.5–5.0)
Alkaline Phosphatase: 79 U/L (ref 38–126)
Anion gap: 12 (ref 5–15)
BUN: 23 mg/dL (ref 8–23)
CO2: 20 mmol/L — ABNORMAL LOW (ref 22–32)
Calcium: 9 mg/dL (ref 8.9–10.3)
Chloride: 102 mmol/L (ref 98–111)
Creatinine, Ser: 1.62 mg/dL — ABNORMAL HIGH (ref 0.44–1.00)
GFR calc Af Amer: 34 mL/min — ABNORMAL LOW (ref >60)
GFR calc non Af Amer: 30 mL/min — ABNORMAL LOW (ref >60)
Glucose, Bld: 110 mg/dL — ABNORMAL HIGH (ref 70–99)
Potassium: 4.9 mmol/L (ref 3.5–5.1)
Sodium: 134 mmol/L — ABNORMAL LOW (ref 135–145)
Total Bilirubin: 1.1 mg/dL (ref 0.3–1.2)
Total Protein: 7.1 g/dL (ref 6.5–8.1)

## 2019-11-24 LAB — TROPONIN I (HIGH SENSITIVITY): Troponin I (High Sensitivity): 10 ng/L (ref <18)

## 2019-11-24 LAB — BASIC METABOLIC PANEL
Anion gap: 12 (ref 5–15)
BUN: 19 mg/dL (ref 8–23)
CO2: 26 mmol/L (ref 22–32)
Calcium: 9.1 mg/dL (ref 8.9–10.3)
Chloride: 99 mmol/L (ref 98–111)
Creatinine, Ser: 1.42 mg/dL — ABNORMAL HIGH (ref 0.44–1.00)
GFR calc Af Amer: 40 mL/min — ABNORMAL LOW (ref >60)
GFR calc non Af Amer: 35 mL/min — ABNORMAL LOW (ref >60)
Glucose, Bld: 96 mg/dL (ref 70–99)
Potassium: 3.5 mmol/L (ref 3.5–5.1)
Sodium: 137 mmol/L (ref 135–145)

## 2019-11-24 LAB — PROTIME-INR
INR: 1.4 — ABNORMAL HIGH (ref 0.8–1.2)
Prothrombin Time: 17.5 seconds — ABNORMAL HIGH (ref 11.4–15.2)

## 2019-11-24 LAB — GLUCOSE, CAPILLARY: Glucose-Capillary: 127 mg/dL — ABNORMAL HIGH (ref 70–99)

## 2019-11-24 SURGERY — CARDIOVERSION
Anesthesia: General

## 2019-11-24 MED ORDER — FUROSEMIDE 10 MG/ML IJ SOLN
40.0000 mg | Freq: Two times a day (BID) | INTRAMUSCULAR | Status: DC
  Administered 2019-11-24 – 2019-11-25 (×2): 40 mg via INTRAVENOUS
  Filled 2019-11-24 (×2): qty 4

## 2019-11-24 MED ORDER — PROPOFOL 10 MG/ML IV BOLUS
INTRAVENOUS | Status: DC | PRN
  Administered 2019-11-24: 60 mg via INTRAVENOUS

## 2019-11-24 MED ORDER — FUROSEMIDE 10 MG/ML IJ SOLN: 40.0000 mg | Freq: Once | INTRAMUSCULAR | Status: DC

## 2019-11-24 MED ORDER — POTASSIUM CHLORIDE CRYS ER 20 MEQ PO TBCR
40.0000 meq | EXTENDED_RELEASE_TABLET | Freq: Once | ORAL | Status: AC
  Administered 2019-11-24: 40 meq via ORAL
  Filled 2019-11-24: qty 2

## 2019-11-24 MED ORDER — EPHEDRINE SULFATE-NACL 50-0.9 MG/10ML-% IV SOSY
PREFILLED_SYRINGE | INTRAVENOUS | Status: DC | PRN
  Administered 2019-11-24: 5 mg via INTRAVENOUS
  Administered 2019-11-24: 10 mg via INTRAVENOUS

## 2019-11-24 MED ORDER — PHENYLEPHRINE 40 MCG/ML (10ML) SYRINGE FOR IV PUSH (FOR BLOOD PRESSURE SUPPORT)
PREFILLED_SYRINGE | INTRAVENOUS | Status: DC | PRN
  Administered 2019-11-24: 80 ug via INTRAVENOUS
  Administered 2019-11-24 (×2): 120 ug via INTRAVENOUS
  Administered 2019-11-24: 80 ug via INTRAVENOUS

## 2019-11-24 MED ORDER — SODIUM CHLORIDE 0.9 % IV SOLN
INTRAVENOUS | Status: AC | PRN
  Administered 2019-11-24: 500 mL via INTRAVENOUS

## 2019-11-24 MED ORDER — LIDOCAINE 2% (20 MG/ML) 5 ML SYRINGE
INTRAMUSCULAR | Status: DC | PRN
  Administered 2019-11-24: 60 mg via INTRAVENOUS

## 2019-11-24 NOTE — Progress Notes (Signed)
Residents at bedside to assess patient. Labs, ABG, and chest xray ordered. Rapid nurse also at bedside.

## 2019-11-24 NOTE — Progress Notes (Addendum)
Patient's abg showing primary respiratory alkalosis and chest xray depicting increased interstitial edema from prior. These are concerning findings for pulmonary edema. Patient to receive IV lasix and monitor for clinical improvement. Troponin pending.   Thromboembolism is less likely given that patient has been anticoagulated with eliquis chronically prior to cardioversion.   Lars Mage, MD Internal Medicine PGY3 Pager:[phone]

## 2019-11-24 NOTE — Anesthesia Preprocedure Evaluation (Signed)
Anesthesia Evaluation  Patient identified by MRN, date of birth, ID band Patient awake    Reviewed: Allergy & Precautions, NPO status , Patient's Chart, lab work & pertinent test results  Airway Mallampati: II  TM Distance: >3 FB Neck ROM: Full    Dental no notable dental hx.    Pulmonary neg pulmonary ROS,    Pulmonary exam normal breath sounds clear to auscultation       Cardiovascular hypertension, +CHF  + dysrhythmias Atrial Fibrillation  Rhythm:Irregular Rate:Normal     Neuro/Psych CVA negative psych ROS   GI/Hepatic Neg liver ROS, GERD  ,  Endo/Other  Hypothyroidism   Renal/GU negative Renal ROS  negative genitourinary   Musculoskeletal negative musculoskeletal ROS (+)   Abdominal   Peds negative pediatric ROS (+)  Hematology negative hematology ROS (+) anemia ,   Anesthesia Other Findings   Reproductive/Obstetrics negative OB ROS                             Anesthesia Physical Anesthesia Plan  ASA: III  Anesthesia Plan: General   Post-op Pain Management:    Induction: Intravenous  PONV Risk Score and Plan: 0 and Treatment may vary due to age or medical condition  Airway Management Planned: Mask  Additional Equipment:   Intra-op Plan:   Post-operative Plan: Extubation in OR  Informed Consent: I have reviewed the patients History and Physical, chart, labs and discussed the procedure including the risks, benefits and alternatives for the proposed anesthesia with the patient or authorized representative who has indicated his/her understanding and acceptance.     Dental advisory given  Plan Discussed with: CRNA and Surgeon  Anesthesia Plan Comments:         Anesthesia Quick Evaluation

## 2019-11-24 NOTE — CV Procedure (Signed)
Procedure:   DCCV  Indication:  Symptomatic atrial fibrillation  Procedure Note:  The patient signed informed consent.  They have had had therapeutic anticoagulation with apixaban greater than 3 weeks.  Anesthesia was administered by Dr. Kalman Shan. She received 60 mg lidocaine and 60 mg propofol.  Adequate airway was maintained throughout and vital followed per protocol.  They were cardioverted x 1 with 120J of biphasic synchronized energy.  They converted to sinus bradycardia.  She had post procedure hypotension treated with phenylephrine.  The patient had normal neuro status and respiratory status post procedure with vitals stable as recorded elsewhere.    Follow up:  They will continue on current medical therapy and follow up with cardiology as scheduled.  Buford Dresser, MD PhD 11/24/2019 1:34 PM

## 2019-11-24 NOTE — Progress Notes (Signed)
Confirmed verbal order with rounding medical team to allow medication administration with sips of water while NPO.

## 2019-11-24 NOTE — Significant Event (Signed)
Rapid Response Event Note  Overview: SOB  Initial Focused Assessment: Received a call from the nurse with concerns of the patient having shortness of breath and overall not feeling well. I was called away to an emergency but I had the nurse reach out to the IMTS MDs. I arrived at 1735 at the bedside and the physicians had ordered CXR, ABG, EKG, and STAT labs. Patient was awake but endorse not feeling well, she felt short of breath and endorse having some discomfort in the RUE and in between her shoulder blades. 99% on 2L, very mild tachypnea, skin warm and dry, good palpable pulses in all extremities. SBP 80s -90s but improved to 120s by the time I left. Lung sounds - clear in all fields. She was not in acute distress but endorsed having that "impending doom" sensation. S/P cardioversion earlier today, tolerated well overall, had some bradycardia and hypotension post procedure but it was treated by the anesthesia team in PACU, HR SR 70s now. After a few minutes stated her abdomen was hurting, felt nauseous, but then this resolved too. She did have some diaphoresis while she was using the bedpan but I think she was straining so we applied a cool rag to her head and it resolved. CARDS team came to bedside around 1800  Interventions: -- No RRT Interventions   Plan of Care: -- Lasix 40 mg IV (gave half at first to ensure BP could tolerate then nurse gave the other half) -- CXR - pulmonary edema ? -- Monitor UOP  -- Monitor VS -- F/u with labs. ABG overall looked okay.    Event Summary:  Call Time 1700 Start Time [place]  Ely Ballen R

## 2019-11-24 NOTE — Progress Notes (Signed)
PT Cancellation Note  Patient Details Name: Teresa York MRN: [id number] DOB: 1941-06-23   Cancelled Treatment:    Reason Eval/Treat Not Completed: Patient at procedure or test/unavailable Pt having Cardioversion will follow up as able.   Maggie Font, PT Acute Rehab Services Pager [phone] [facility] [place] [facility] [place] 11/24/2019, 1:42 PM

## 2019-11-24 NOTE — Progress Notes (Addendum)
Progress Note  Patient Name: Teresa York Date of Encounter: 11/24/2019  Primary Cardiologist: Lauree Chandler, MD   Subjective   No complaints this morning. Planned for cardioversion.   Inpatient Medications    Scheduled Meds: . amiodarone  100 mg Oral BID  . apixaban  5 mg Oral BID  . cholecalciferol  1,000 Units Oral Daily  . feeding supplement (ENSURE ENLIVE)  237 mL Oral BID BM  . FLUoxetine  40 mg Oral Daily  . latanoprost  1 drop Both Eyes QHS  . levothyroxine  175 mcg Oral Q0600  . metoprolol tartrate  12.5 mg Oral BID  . multivitamin with minerals  1 tablet Oral Daily  . pantoprazole  80 mg Oral Daily  . potassium chloride  40 mEq Oral Once  . traZODone  75 mg Oral QHS   Continuous Infusions:  PRN Meds: acetaminophen **OR** acetaminophen, promethazine, senna-docusate   Vital Signs    Vitals:   11/23/19 2145 11/23/19 2201 11/24/19 0541 11/24/19 0924  BP: 119/77 130/85 130/85   Pulse: 97 (!) 102 85   Resp:  20 16   Temp:  (!) 97.5 F (36.4 C) 97.7 F (36.5 C)   TempSrc:  Oral Oral   SpO2:  100% 92%   Weight:    59.5 kg  Height:        Intake/Output Summary (Last 24 hours) at 11/24/2019 1009 Last data filed at 11/23/2019 1853 Gross per 24 hour  Intake 480 ml  Output 1350 ml  Net -870 ml   Last 3 Weights 11/24/2019 11/23/2019 11/22/2019  Weight (lbs) 131 lb 2.8 oz 132 lb 15 oz 135 lb 12.9 oz  Weight (kg) 59.5 kg 60.3 kg 61.6 kg      Telemetry    Afib/flutter - Personally Reviewed  ECG    No new tracing  Physical Exam  Pleasant older WF GEN: No acute distress.   Neck: No JVD Cardiac: Irreg Irreg, no murmurs, rubs, or gallops.  Respiratory: Clear to auscultation bilaterally. GI: Soft, nontender, non-distended  MS: No edema; No deformity. Neuro:  Nonfocal  Psych: Normal affect   Labs    High Sensitivity Troponin:   Recent Labs  Lab 11/22/19 1122 11/22/19 1421 11/22/19 1547  TROPONINIHS 16 12 10       Chemistry Recent Labs    Lab 11/22/19 1009 11/23/19 0445 11/24/19 0639  NA 136 140 137  K 3.3* 4.1 3.5  CL 102 104 99  CO2 23 24 26   GLUCOSE 112* 103* 96  BUN 13 14 19   CREATININE 1.06* 1.49* 1.42*  CALCIUM 9.1 9.4 9.1  GFRNONAA 50* 33* 35*  GFRAA 57* 38* 40*  ANIONGAP 11 12 12      Hematology Recent Labs  Lab 11/22/19 1009 11/23/19 0445  WBC 6.3 8.5  RBC 4.00 4.13  HGB 9.1* 9.1*  HCT 30.6* 31.4*  MCV 76.5* 76.0*  MCH 22.8* 22.0*  MCHC 29.7* 29.0*  RDW 17.2* 17.4*  PLT 344 339    BNP Recent Labs  Lab 11/22/19 1122  BNP 1,012.5*     DDimer No results for input(s): DDIMER in the last 168 hours.   Radiology    DG Chest Portable 1 View  Result Date: 11/22/2019 CLINICAL DATA:  Pt reports afib, heart racing, SOB, wheezing, cough, body aches, and nausea x 2 weeks. EXAM: PORTABLE CHEST 1 VIEW COMPARISON:  Chest radiograph 08/26/2018, 08/03/2018 FINDINGS: Stable cardiomediastinal contours with enlarged heart size. There are diffuse bilateral interstitial opacities favored to represent  mild edema. Probable trace right pleural effusion. No evidence of pneumothorax. No acute finding in the visualized skeleton. IMPRESSION: Cardiomegaly with diffuse bilateral interstitial opacities favored to represent mild pulmonary edema. Probable trace right pleural effusion. Electronically Signed   By: Audie Pinto M.D.   On: 11/22/2019 10:53   ECHOCARDIOGRAM COMPLETE  Result Date: 11/22/2019    ECHOCARDIOGRAM REPORT   Patient Name:   Teresa York Date of Exam: 11/22/2019 Medical Rec #:  [id number]  Height:       62.5 in Accession #:    [id number] Weight:       134.6 lb Date of Birth:  01-Apr-1941 BSA:          1.63 m Patient Age:    79 years   BP:           144/73 mmHg Patient Gender: F          HR:           103 bpm. Exam Location:  Inpatient Procedure: 2D Echo, Cardiac Doppler and Color Doppler Indications:    Atrial Fibrillation 427.31  History:        Patient has prior history of Echocardiogram examinations,  most                 recent 11/15/2017. CHF, Stroke, Arrythmias:Atrial Fibrillation;                 Risk Factors:Hypertension and Non-Smoker. AI.  Sonographer:    Vickie Epley RDCS Referring Phys: [place]  1. Left ventricular ejection fraction, by estimation, is 35 to 40%. The left ventricle has moderately decreased function. The left ventricle demonstrates global hypokinesis. Left ventricular diastolic function could not be evaluated.  2. Right ventricular systolic function is moderately reduced. The right ventricular size is normal. There is mildly elevated pulmonary artery systolic pressure. The estimated right ventricular systolic pressure is [id number] mmHg.  3. Left atrial size was severely dilated.  4. Right atrial size was severely dilated.  5. The mitral valve is normal in structure and function. Mild to moderate mitral valve regurgitation.  6. The aortic valve is tricuspid. Aortic valve regurgitation is moderate. Mild to moderate aortic valve sclerosis/calcification is present, without any evidence of aortic stenosis.  7. The inferior vena cava is normal in size with <50% respiratory variability, suggesting right atrial pressure of 8 mmHg. Comparison(s): Prior images unable to be directly viewed, comparison made by report only. The left ventricular function is significantly worse. FINDINGS  Left Ventricle: Left ventricular ejection fraction, by estimation, is 35 to 40%. The left ventricle has moderately decreased function. The left ventricle demonstrates global hypokinesis. The left ventricular internal cavity size was normal in size. There is no left ventricular hypertrophy. The left ventricular diastology could not be evaluated due to atrial fibrillation. Left ventricular diastolic function could not be evaluated. Right Ventricle: The right ventricular size is normal. No increase in right ventricular wall thickness. Right ventricular systolic function is moderately reduced. There is mildly  elevated pulmonary artery systolic pressure. The tricuspid regurgitant velocity is 2.83 m/s, and with an assumed right atrial pressure of 3 mmHg, the estimated right ventricular systolic pressure is [id number] mmHg. Left Atrium: Left atrial size was severely dilated. Right Atrium: Right atrial size was severely dilated. Pericardium: A small pericardial effusion is present. The pericardial effusion is circumferential. There is no evidence of cardiac tamponade. Mitral Valve: The mitral valve is normal in structure and function. There is mild thickening of the  mitral valve leaflet(s). Mild mitral annular calcification. Mild to moderate mitral valve regurgitation, with centrally-directed jet. Tricuspid Valve: The tricuspid valve is normal in structure. Tricuspid valve regurgitation is mild. Aortic Valve: The aortic valve is tricuspid. Aortic valve regurgitation is moderate. Aortic regurgitation PHT measures 336 msec. Mild to moderate aortic valve sclerosis/calcification is present, without any evidence of aortic stenosis. Pulmonic Valve: The pulmonic valve was normal in structure. Pulmonic valve regurgitation is trivial. Aorta: The aortic root and ascending aorta are structurally normal, with no evidence of dilitation. Venous: The inferior vena cava is normal in size with less than 50% respiratory variability, suggesting right atrial pressure of 8 mmHg. IAS/Shunts: There is right bowing of the interatrial septum, suggestive of elevated left atrial pressure. No atrial level shunt detected by color flow Doppler.  LEFT VENTRICLE PLAX 2D LVIDd:         5.30 cm LVIDs:         4.40 cm LV PW:         0.80 cm LV IVS:        0.80 cm LVOT diam:     2.00 cm LV SV:         43.67 ml LV SV Index:   28.86 LVOT Area:     3.14 cm  LV Volumes (MOD) LV vol d, MOD A2C: 109.0 ml LV vol d, MOD A4C: 119.0 ml LV vol s, MOD A2C: 73.1 ml LV vol s, MOD A4C: 77.3 ml LV SV MOD A2C:     35.9 ml LV SV MOD A4C:     119.0 ml LV SV MOD BP:      39.7 ml RIGHT  VENTRICLE RV S prime:     7.71 cm/s TAPSE (M-mode): 1.4 cm LEFT ATRIUM             Index       RIGHT ATRIUM           Index LA diam:        4.90 cm 3.01 cm/m  RA Area:     21.90 cm LA Vol (A2C):   81.9 ml 50.39 ml/m RA Volume:   65.80 ml  40.49 ml/m LA Vol (A4C):   86.3 ml 53.10 ml/m LA Biplane Vol: 91.8 ml 56.48 ml/m  AORTIC VALVE LVOT Vmax:   89.40 cm/s LVOT Vmean:  58.100 cm/s LVOT VTI:    0.139 m AI PHT:      336 msec  AORTA Ao Root diam: 2.70 cm MR Peak grad:    103.6 mmHg  TRICUSPID VALVE MR Mean grad:    69.0 mmHg   TR Peak grad:   32.0 mmHg MR Vmax:         509.00 cm/s TR Vmax:        283.00 cm/s MR Vmean:        395.0 cm/s MR PISA:         0.57 cm    SHUNTS MR PISA Eff ROA: 4 mm       Systemic VTI:  0.14 m MR PISA Radius:  0.30 cm     Systemic Diam: 2.00 cm Sanda Klein MD Electronically signed by Sanda Klein MD Signature Date/Time: 11/22/2019/3:47:45 PM    Final     Cardiac Studies   TTE: 11/22/19  IMPRESSIONS    1. Left ventricular ejection fraction, by estimation, is 35 to 40%. The  left ventricle has moderately decreased function. The left ventricle  demonstrates global hypokinesis. Left ventricular diastolic function could  not be evaluated.  2. Right ventricular systolic function is moderately reduced. The right  ventricular size is normal. There is mildly elevated pulmonary artery  systolic pressure. The estimated right ventricular systolic pressure is  [id number] mmHg.  3. Left atrial size was severely dilated.  4. Right atrial size was severely dilated.  5. The mitral valve is normal in structure and function. Mild to moderate  mitral valve regurgitation.  6. The aortic valve is tricuspid. Aortic valve regurgitation is moderate.  Mild to moderate aortic valve sclerosis/calcification is present, without  any evidence of aortic stenosis.  7. The inferior vena cava is normal in size with <50% respiratory  variability, suggesting right atrial pressure of 8 mmHg.    Patient Profile     79 y.o. 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 Fib s/p multiple cardioversions who is being seen today for the evaluation of decreased EF and A. fib at the request of Dr. Evette Doffing.  Assessment & Plan    1. 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 Follow-up EF 2019 showed EF 60-65% so no ischemic work-up was pursued. - Echo this admission EF 35-40% with global hypokinesis, moderately reduced RV function with RV systolic pressure 35 mmHg, mild to mod MR - HS troponin trend flat and not consistent with ACS. Suspect decompensation is related to Afib RVR.   2. Acute on chronic systolic and diastolic HF: EF in [id number]. EF this admission lower 35-40%. BNP elevated to over 1000. CXR with pulmonary edema. She has been diuresed with IV lasix. Net -2.1, weight is trending down. Lasix now held with rise in Cr.  3. Persistent Afib: On Eliquis 5 mg BID for stroke prophylaxis. Amiodarone was increased 100mg  BID and Toprol 12.5mg  added. Rates remain in the 100s. - Planned for DCCV today.  4. AKI - 1.06 > 1.49>>1.42 - baseline around 1.2  5. Mild to moderate MR - per echo  6. H/o of stroke - continue Eliquis  For questions or updates, please contact [place] Please consult [email or web address] for contact info under    Signed, Reino Bellis, NP  11/24/2019, 10:09 AM    I have personally seen and examined this patient. I agree with the assessment and plan as outlined above.  She is doing well this am. She has diuresed well. Lasix is now held.  She remains in atrial fibrillation. I think restoring sinus rhythm will be helpful. Plans for DCCV  today. Continue amiodarone, beta blocker.   Lauree Chandler 11/24/2019 10:37 AM

## 2019-11-24 NOTE — Anesthesia Procedure Notes (Signed)
Procedure Name: General with mask airway Date/Time: 11/24/2019 1:16 PM Performed by: Candis Shine, CRNA Pre-anesthesia Checklist: Patient identified, Emergency Drugs available, Suction available, Patient being monitored and Timeout performed Patient Re-evaluated:Patient Re-evaluated prior to induction Oxygen Delivery Method: Ambu bag Preoxygenation: Pre-oxygenation with 100% oxygen Induction Type: IV induction Dental Injury: Teeth and Oropharynx as per pre-operative assessment

## 2019-11-24 NOTE — Anesthesia Postprocedure Evaluation (Signed)
Anesthesia Post Note  Patient: Teresa York  Procedure(s) Performed: CARDIOVERSION (N/A )     Patient location during evaluation: PACU Anesthesia Type: General Level of consciousness: awake and alert Pain management: pain level controlled Vital Signs Assessment: post-procedure vital signs reviewed and stable Respiratory status: spontaneous breathing, nonlabored ventilation, respiratory function stable and patient connected to nasal cannula oxygen Cardiovascular status: blood pressure returned to baseline and stable Postop Assessment: no apparent nausea or vomiting Anesthetic complications: no    Last Vitals:  Vitals:   11/24/19 1431 11/24/19 1445  BP: (!) 111/51 (!) 110/54  Pulse: 67 62  Resp:    Temp:    SpO2: 94%     Last Pain:  Vitals:   11/24/19 1333  TempSrc: Oral  PainSc: 0-No pain                 Courtney Bellizzi S

## 2019-11-24 NOTE — Transfer of Care (Signed)
Immediate Anesthesia Transfer of Care Note  Patient: Teresa York  Procedure(s) Performed: CARDIOVERSION (N/A )  Patient Location: Endoscopy Unit  Anesthesia Type:General  Level of Consciousness: awake, alert  and oriented  Airway & Oxygen Therapy: Patient Spontanous Breathing  Post-op Assessment: Report given to RN and Post -op Vital signs reviewed and stable  Post vital signs: Reviewed and stable  Last Vitals:  Vitals Value Taken Time  BP 90/37 11/24/19 1342  Temp    Pulse 62 11/24/19 1344  Resp 24 11/24/19 1344  SpO2 96 % 11/24/19 1344    Last Pain:  Vitals:   11/24/19 1333  TempSrc: Oral  PainSc: 0-No pain         Complications: No apparent anesthesia complications

## 2019-11-24 NOTE — Interval H&P Note (Signed)
History and Physical Interval Note:  11/24/2019 12:35 PM  Teresa York  has presented today for surgery, with the diagnosis of AFIB.  The various methods of treatment have been discussed with the patient and family. After consideration of risks, benefits and other options for treatment, the patient has consented to  Procedure(s): CARDIOVERSION (N/A) as a surgical intervention.  The patient's history has been reviewed, patient examined, no change in status, stable for surgery.  I have reviewed the patient's chart and labs.  Questions were answered to the patient's satisfaction.     Hendrick Pavich Harrell Gave

## 2019-11-24 NOTE — Progress Notes (Signed)
OT Cancellation Note  Patient Details Name: Teresa York MRN: [id number] DOB: 16-May-1941   Cancelled Treatment:    Reason Eval/Treat Not Completed: Fatigue/lethargy limiting ability to participate, pt fatigued and declined participation in OT. Noted RN reports plan for cardioversion today.  Will follow and see as able.   Jolaine Artist, OT Acute Rehabilitation Services Pager [phone] Office [phone]    Delight Stare 11/24/2019, 8:47 AM

## 2019-11-24 NOTE — Progress Notes (Addendum)
Pt stated that she  Does not feel good, Still complians sob and head ache.  BP 109/60   Pulse 71   Temp 97.8 F (36.6 C) (Oral)   Resp (!) 25   Ht 5\' 1"  (1.549 m)   Wt 59.5 kg   LMP  (LMP Unknown)   SpO2 98%   BMI 24.79 kg/m   Hooked to continuous pulse ox  Sating 98% on 2l 02. Lungs clear. Pt urinating fine after lasix given. Will monitor.    0500 Am. Pt stated that she feels better than last night. Asked tylenol for the back pain. Will monitor

## 2019-11-24 NOTE — Progress Notes (Signed)
Patient is feeling SOB and with RR of 22. Oxygen saturation is 91% on RA. 2L of oxygen applied and saturations up to 98%. Patient reports she does not feel well. Will notify on call MD

## 2019-11-24 NOTE — Progress Notes (Signed)
Received page from Nurse in charge of [name] about new onset fatigue with RUE pain. Dr. Maricela Bo and Dr. Gilford Rile saw patient at bedside. Patient appeared drowsy and uncomfortable. Patient could not fully inhale on physical examination was ordered. Patient was status post cardioversion today. She denies chest pain, vomiting, abdominal pain, headaches, weakness, changes in vision. She endorses slight nausea and pain in the RUE.    Physical Exam Constitutional:      Appearance: She is not ill-appearing, toxic-appearing or diaphoretic.     Comments: Patient appears uncomfortable at bedside.   Cardiovascular:     Rate and Rhythm: Normal rate and regular rhythm.     Pulses: Normal pulses.     Heart sounds: No murmur. No friction rub. No gallop.   Pulmonary:     Breath sounds: Normal breath sounds. No decreased breath sounds, wheezing, rhonchi or rales.     Comments: Tachypneic with a RR in the 20-30s.  Chest:     Chest wall: No tenderness.  Abdominal:     General: Bowel sounds are normal.     Palpations: Abdomen is soft.     Tenderness: There is no abdominal tenderness.  Musculoskeletal:     Right lower leg: No edema.     Left lower leg: No edema.  Neurological:     Mental Status: She is alert.    Plan:  - Serial Troponin ordered - ABG ordered - CXR ordered - EKG ordered - Consulted with cardiology.

## 2019-11-24 NOTE — Progress Notes (Addendum)
Subjective: Patient declined CPAP last night. She states that she rested well overnight. She denies any difficulty breathing, CP, or racing heart rate. She was fatigued this morning.  Objective:  Vital signs in last 24 hours: Vitals:   11/23/19 2145 11/23/19 2201 11/24/19 0541 11/24/19 0924  BP: 119/77 130/85 130/85   Pulse: 97 (!) 102 85   Resp:  20 16   Temp:  (!) 97.5 F (36.4 C) 97.7 F (36.5 C)   TempSrc:  Oral Oral   SpO2:  100% 92%   Weight:    59.5 kg  Height:       Weight change:    2/14 61.6 kg --> 2/15 60.3 kg  Intake/Output Summary (Last 24 hours) at 11/24/2019 1017 Last data filed at 11/23/2019 1853 Gross per 24 hour  Intake 480 ml  Output 1350 ml  Net -870 ml   Physical Exam: Gen:  Elderly woman lying in bed in NAD with [place] in place HEENT:  Atraumatic, normocephalic Cardio:  Irregularly irregular rhythm, regular rate, no murmurs, rubs, gallops, JVD 7cm above sternum Pulm:  CTAB, no wheezes, crackles present in bilateral lung bases.  Breathing comfortably on 2 L [place] Extremities:  Trace lower extremity edema    Labs:  CMP Latest Ref Rng & Units 11/24/2019 11/23/2019 11/22/2019  Glucose 70 - 99 mg/dL 96 103(H) 112(H)  BUN 8 - 23 mg/dL 19 14 13   Creatinine 0.44 - 1.00 mg/dL 1.42(H) 1.49(H) 1.06(H)  Sodium 135 - 145 mmol/L 137 140 136  Potassium 3.5 - 5.1 mmol/L 3.5 4.1 3.3(L)  Chloride 98 - 111 mmol/L 99 104 102  CO2 22 - 32 mmol/L 26 24 23   Calcium 8.9 - 10.3 mg/dL 9.1 9.4 9.1  Total Protein 6.0 - 8.5 g/dL - - -  Total Bilirubin 0.0 - 1.2 mg/dL - - -  Alkaline Phos 39 - 117 IU/L - - -  AST 0 - 40 IU/L - - -  ALT 0 - 32 IU/L - - -    Assessment/Plan:  Principal Problem:   Acute HFrEF (heart failure with reduced ejection fraction) (HCC) Active Problems:   Atrial fibrillation with RVR (HCC)   Chest tightness   Acute on chronic HFrEF (heart failure with reduced ejection fraction) ([place])   Ms. Heinert is an 79 yo female with hx of Barrett's esophagus  without dysplasia, atrial fibrillation, combined systolic and diastolic CHF, HTN, hypothyroidism, stroke (2019) who presented to to the ED with worsening SOB and chest pain, and was admitted for acute heart failure with reduced ejection fraction.   #Acute HFrEF due to Atrial Fibrillation:   Patient with progressive SOB and CP over the past two weeks, with acute worsening for 2 days prior to admission.  BNP significantly elevated 1,012 on admission.  Patient does not take Lasix at home due to urinary incontinence, contributing to fluid overload.  Echo showed reduced EF 35-40% and LV global hypokinesis, and severe dilation of LA and RA.  Acute heart failure likely caused by afib.  She underwent electrical cardioversion in the past (11/02/16, 11/16/16, 12/24/16), and was successfully cardioverted back to sinus rhythm on 12/24/16.  Since then, she has been following with Dr. Rayann Heman and has been in and out of afib while on maintenance dose of amiodarone 100 mg daily.  She was in sinus rhythm during last two cardiology appts in Feb 2020 and Aug 2020.  Plan to cardiovert today.  ACS ruled out with high sensitivity troponin neg x2 and ECG  with afib but no ischemic changes.  Unlikely thyroid-induced, as TSH wnl.  Additionally, BP well controlled.   Diuresed well, held yesterday for rising creatinine, on exam today does have elevated JVP and some perisistent edema. Will discuss further diuresis with cardiology  - Cardioversion today (NPO since midnight) -  Amiodarone 100 mg BID (home dose 100 mg qam) - Metoprolol tartrate 12.5 mg daily -  Continue home Eliquis 5 mg twice daily -  Continue telemetry -  Strict Is/Os -  Daily Weights -  PT eval 2/14-- Recommendation 3x weekly.  No equipment recommendations.    AKI:  Cr 1.49 2/15, increased from 1.06 (~baseline).  Held Lasix. About the same today at 1.42  Hypokalemia, resolved:  3.3 on admission, repleted in ED--> 4.1 2/15.     -  Replete as needed  Increased  nutrient needs related to chronic illness (CHF)  Patient has lost over 100 lbs over the past 2 years.  Appreciate nutrition recommendations (remote service) -  MVI with minerals daily -  Ensure Enlive po BID with meals  #Microcytic anemia:  Likely iron deficiency anemia with low Fe and low ferritin. H/H is stable from 5 months ago.   #Hx of stroke (Feb 2019) - Continue home Eliquis  #Hypothyroidism:  TSH wnl 2/14 -  Continue home levothyroxine 175 mcg   LOS: 1 day   Johny Blamer, Medical Student 11/24/2019, 10:17 AM

## 2019-11-24 NOTE — Progress Notes (Signed)
Called by primary concerning patient tachypneic and feeling unwell. Reviewed EKG - no change from post-DCCV, reviewed with Dr. [name]. Likely needs more diuretic. I have ordered 40 mg IV lasix BID for tonight. Thromboembolism unlikely given that she has been on long term anticoagulation.

## 2019-11-25 ENCOUNTER — Inpatient Hospital Stay (HOSPITAL_COMMUNITY): Payer: PPO

## 2019-11-25 DIAGNOSIS — I130 Hypertensive heart and chronic kidney disease with heart failure and stage 1 through stage 4 chronic kidney disease, or unspecified chronic kidney disease: Principal | ICD-10-CM

## 2019-11-25 DIAGNOSIS — N1831 Chronic kidney disease, stage 3a: Secondary | ICD-10-CM

## 2019-11-25 DIAGNOSIS — R011 Cardiac murmur, unspecified: Secondary | ICD-10-CM

## 2019-11-25 LAB — CBC WITH DIFFERENTIAL/PLATELET
Abs Immature Granulocytes: 0.05 10*3/uL (ref 0.00–0.07)
Basophils Absolute: 0.1 10*3/uL (ref 0.0–0.1)
Basophils Relative: 0 %
Eosinophils Absolute: 0.1 10*3/uL (ref 0.0–0.5)
Eosinophils Relative: 1 %
HCT: 30.4 % — ABNORMAL LOW (ref 36.0–46.0)
Hemoglobin: 8.9 g/dL — ABNORMAL LOW (ref 12.0–15.0)
Immature Granulocytes: 0 %
Lymphocytes Relative: 12 %
Lymphs Abs: 1.4 10*3/uL (ref 0.7–4.0)
MCH: 22.4 pg — ABNORMAL LOW (ref 26.0–34.0)
MCHC: 29.3 g/dL — ABNORMAL LOW (ref 30.0–36.0)
MCV: 76.6 fL — ABNORMAL LOW (ref 80.0–100.0)
Monocytes Absolute: 1 10*3/uL (ref 0.1–1.0)
Monocytes Relative: 9 %
Neutro Abs: 8.6 10*3/uL — ABNORMAL HIGH (ref 1.7–7.7)
Neutrophils Relative %: 78 %
Platelets: 303 10*3/uL (ref 150–400)
RBC: 3.97 MIL/uL (ref 3.87–5.11)
RDW: 17.1 % — ABNORMAL HIGH (ref 11.5–15.5)
WBC: 11.2 10*3/uL — ABNORMAL HIGH (ref 4.0–10.5)
nRBC: 0 % (ref 0.0–0.2)

## 2019-11-25 LAB — BASIC METABOLIC PANEL
Anion gap: 12 (ref 5–15)
BUN: 23 mg/dL (ref 8–23)
CO2: 23 mmol/L (ref 22–32)
Calcium: 9.2 mg/dL (ref 8.9–10.3)
Chloride: 101 mmol/L (ref 98–111)
Creatinine, Ser: 1.57 mg/dL — ABNORMAL HIGH (ref 0.44–1.00)
GFR calc Af Amer: 36 mL/min — ABNORMAL LOW (ref >60)
GFR calc non Af Amer: 31 mL/min — ABNORMAL LOW (ref >60)
Glucose, Bld: 133 mg/dL — ABNORMAL HIGH (ref 70–99)
Potassium: 3.9 mmol/L (ref 3.5–5.1)
Sodium: 136 mmol/L (ref 135–145)

## 2019-11-25 MED ORDER — FUROSEMIDE 10 MG/ML IJ SOLN
40.0000 mg | Freq: Two times a day (BID) | INTRAMUSCULAR | Status: DC
  Administered 2019-11-25 – 2019-11-27 (×5): 40 mg via INTRAVENOUS
  Filled 2019-11-25 (×5): qty 4

## 2019-11-25 MED ORDER — ENSURE ENLIVE PO LIQD
237.0000 mL | Freq: Every day | ORAL | Status: DC
  Administered 2019-11-25 – 2019-12-03 (×9): 237 mL via ORAL

## 2019-11-25 MED ORDER — PRO-STAT SUGAR FREE PO LIQD
30.0000 mL | Freq: Two times a day (BID) | ORAL | Status: DC
  Administered 2019-11-25 – 2019-12-04 (×16): 30 mL via ORAL
  Filled 2019-11-25 (×18): qty 30

## 2019-11-25 MED ORDER — APIXABAN 2.5 MG PO TABS
2.5000 mg | ORAL_TABLET | Freq: Two times a day (BID) | ORAL | Status: DC
  Administered 2019-11-25 – 2019-12-04 (×18): 2.5 mg via ORAL
  Filled 2019-11-25 (×18): qty 1

## 2019-11-25 NOTE — Progress Notes (Signed)
Paged MD at [phone]; informed of BP and concerns about administering Lasix IV and  Metoprolol; informed team will round shortly.  During reassessment at 0947 pt. Increased WOB, pursed lip and tachpnea in low 20's. Patient reported feeling as though needed to have a BM. Patient reported fatigue due to poor sleep. Patient O2 demand increase with 87% at room air, 2L [place] applied mid to high 90's. No reports of chest pain.  Spoke with care team at bedside and informed of concerns about patient O2 at 1012. Informed to hold metoprolol and give Lasix (SOB may be due to fluid overload). Continue to monitor.  Left for xray at 1253. Returned 1308, patient A&O x 4 resting comfortably.

## 2019-11-25 NOTE — Progress Notes (Signed)
Nutrition Follow-up  DOCUMENTATION CODES:   Not applicable  INTERVENTION:   -Decrease Ensure Enlive po to daily, each supplement provides 350 kcal and 20 grams of protein -30 ml Prostat BID, each supplement provides 100 kclas and 15 grams protein -MVI with minerals daily -Magic cup TID with meals, each supplement provides 290 kcal and 9 grams of protein  NUTRITION DIAGNOSIS:   Increased nutrient needs related to chronic illness(CHF) as evidenced by estimated needs.  Ongoing  GOAL:   Patient will meet greater than or equal to 90% of their needs  Progressing   MONITOR:   PO intake, Supplement acceptance, Labs, Weight trends, Skin, I & O's  REASON FOR ASSESSMENT:   Consult Assessment of nutrition requirement/status  ASSESSMENT:   Teresa York is an 79 yo female with hx of Barrett's esophagus without dysplasia, atrial fibrillation, combined systolic and diastolic CHF, HTN, hypothyroidism, stroke (2019) who presented to to the ED with worsening SOB and chest pain  Reviewed I/O's: -380 ml x 24 hours and -2.5 L since admission  UOP: 800 ml x 24 hours  Spoke with pt and husband at bedside, who report ongoing decreased appetite and weight loss over the past 2 years. Per pt husband, pt weighed approximately 250#, but weight and appetite gradually decreased over the past 2 years secondary to stroke. Husband reports pt grazes throughout the days and he offers pt favorite food and snacks, however, she still eats very little. He estimates she was consuming 1 chocolate Boost supplement daily.   Observed Ensure supplement in room. Pt did not consume, as she did not like flavor. She reports she did consume one yesterday. Observed pt consuming lunch- she consumed only a cup of jello.   Pt's husband reports decline in mobility- pt only able to transfer out of bed to kitchen or bathroom; he reports concern that pt will also lose that mobility. RD discussed importance of good meal and  supplement intake to promote healing. She is amenable to Ensure- will also try Prostat and Magic Cups.   Noted pt with generalized edema, which is likely masking further weight loss as well as fat and muscle depletion.   Labs reviewed: Na: 134.   NUTRITION - FOCUSED PHYSICAL EXAM:    Most Recent Value  Orbital Region  No depletion  Upper Arm Region  Moderate depletion  Thoracic and Lumbar Region  No depletion  Buccal Region  No depletion  Temple Region  No depletion  Clavicle Bone Region  No depletion  Clavicle and Acromion Bone Region  No depletion  Scapular Bone Region  No depletion  Dorsal Hand  Mild depletion  Patellar Region  No depletion  Anterior Thigh Region  No depletion  Posterior Calf Region  No depletion  Edema (RD Assessment)  Mild  Hair  Reviewed  Eyes  Reviewed  Mouth  Reviewed  Skin  Reviewed  Nails  Reviewed       Diet Order:   Diet Order            Diet Heart Room service appropriate? Yes; Fluid consistency: Thin  Diet effective now              EDUCATION NEEDS:   No education needs have been identified at this time  Skin:  Skin Assessment: Reviewed RN Assessment  Last BM:  11/22/19  Height:   Ht Readings from Last 1 Encounters:  11/22/19 5\' 1"  (1.549 m)    Weight:   Wt Readings from Last 1 Encounters:  11/25/19 59.9 kg    Ideal Body Weight:  47.7 kg  BMI:  Body mass index is 24.94 kg/m.  Estimated Nutritional Needs:   Kcal:  1600-1800  Protein:  75-90 grams  Fluid:  > 1.6 L    Teresa York, RD, LDN, [place] Registered Dietitian II Certified Diabetes Care and Education Specialist Please refer to [facility] for RD and/or RD on-call/weekend/after hours pager

## 2019-11-25 NOTE — Progress Notes (Signed)
To the best of my knowledge, the student's charting is accurate.  

## 2019-11-25 NOTE — Progress Notes (Addendum)
Progress Note  Patient Name: Teresa York Date of Encounter: 11/25/2019  Primary Cardiologist: Lauree Chandler, MD   Subjective   Feels her breathing is worse today, very weak.   Inpatient Medications    Scheduled Meds: . amiodarone  100 mg Oral BID  . apixaban  5 mg Oral BID  . cholecalciferol  1,000 Units Oral Daily  . feeding supplement (ENSURE ENLIVE)  237 mL Oral BID BM  . FLUoxetine  40 mg Oral Daily  . furosemide  40 mg Intravenous Q12H  . latanoprost  1 drop Both Eyes QHS  . levothyroxine  175 mcg Oral Q0600  . metoprolol tartrate  12.5 mg Oral BID  . multivitamin with minerals  1 tablet Oral Daily  . pantoprazole  80 mg Oral Daily  . traZODone  75 mg Oral QHS   Continuous Infusions:  PRN Meds: acetaminophen **OR** acetaminophen, promethazine, senna-docusate   Vital Signs    Vitals:   11/25/19 0425 11/25/19 0834 11/25/19 0945 11/25/19 0947  BP: 118/66 (!) 102/45  110/65  Pulse: 60 61  (!) 58  Resp:  20    Temp: 98 F (36.7 C) 97.8 F (36.6 C)    TempSrc: Oral Oral    SpO2: 97% 94% (!) 88% 97%  Weight: 59.9 kg     Height:        Intake/Output Summary (Last 24 hours) at 11/25/2019 1008 Last data filed at 11/25/2019 0800 Gross per 24 hour  Intake 780 ml  Output 1150 ml  Net -370 ml   Last 3 Weights 11/25/2019 11/24/2019 11/23/2019  Weight (lbs) 132 lb 131 lb 2.8 oz 132 lb 15 oz  Weight (kg) 59.875 kg 59.5 kg 60.3 kg      Telemetry    SB - Personally Reviewed  ECG    No new tracing this morning.  Physical Exam  Pleasant older WF, sitting up in bed. GEN: No acute distress.   Neck: No JVD Cardiac: RRR, no murmurs, rubs, or gallops.  Respiratory: Mild crackles at bases GI: Soft, nontender, non-distended  MS: No edema; No deformity. Neuro:  Nonfocal  Psych: Normal affect   Labs    High Sensitivity Troponin:   Recent Labs  Lab 11/22/19 1122 11/22/19 1421 11/22/19 1547 11/24/19 1819  TROPONINIHS 16 12 10 10        Chemistry Recent Labs  Lab 11/24/19 0639 11/24/19 2142 11/25/19 0825  NA 137 134* 136  K 3.5 4.9 3.9  CL 99 102 101  CO2 26 20* 23  GLUCOSE 96 110* 133*  BUN 19 23 23   CREATININE 1.42* 1.62* 1.57*  CALCIUM 9.1 9.0 9.2  PROT  --  7.1  --   ALBUMIN  --  3.0*  --   AST  --  31  --   ALT  --  16  --   ALKPHOS  --  79  --   BILITOT  --  1.1  --   GFRNONAA 35* 30* 31*  GFRAA 40* 34* 36*  ANIONGAP 12 12 12      Hematology Recent Labs  Lab 11/22/19 1009 11/23/19 0445 11/25/19 0444  WBC 6.3 8.5 11.2*  RBC 4.00 4.13 3.97  HGB 9.1* 9.1* 8.9*  HCT 30.6* 31.4* 30.4*  MCV 76.5* 76.0* 76.6*  MCH 22.8* 22.0* 22.4*  MCHC 29.7* 29.0* 29.3*  RDW 17.2* 17.4* 17.1*  PLT 344 339 303    BNP Recent Labs  Lab 11/22/19 1122  BNP 1,012.5*     DDimer  No results for input(s): DDIMER in the last 168 hours.   Radiology    DG Chest Portable 1 View  Result Date: 11/24/2019 CLINICAL DATA:  Acute onset fatigue and right arm pain. Short of breath EXAM: PORTABLE CHEST 1 VIEW COMPARISON:  11/22/2019 FINDINGS: Cardiac enlargement without significant vascular congestion. Atherosclerotic aorta Progression of right upper lobe patchy airspace disease. Progression of mild left lower lobe airspace disease. Small right effusion. IMPRESSION: Progression of mild bilateral airspace disease, possible pneumonia Electronically Signed   By: Franchot Gallo M.D.   On: 11/24/2019 18:32    Cardiac Studies   TTE: 11/22/19  IMPRESSIONS    1. Left ventricular ejection fraction, by estimation, is 35 to 40%. The  left ventricle has moderately decreased function. The left ventricle  demonstrates global hypokinesis. Left ventricular diastolic function could  not be evaluated.  2. Right ventricular systolic function is moderately reduced. The right  ventricular size is normal. There is mildly elevated pulmonary artery  systolic pressure. The estimated right ventricular systolic pressure is  [id number] mmHg.   3. Left atrial size was severely dilated.  4. Right atrial size was severely dilated.  5. The mitral valve is normal in structure and function. Mild to moderate  mitral valve regurgitation.  6. The aortic valve is tricuspid. Aortic valve regurgitation is moderate.  Mild to moderate aortic valve sclerosis/calcification is present, without  any evidence of aortic stenosis.  7. The inferior vena cava is normal in size with <50% respiratory  variability, suggesting right atrial pressure of 8 mmHg.   Patient Profile     79 y.o. 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 Fibs/pmultiple cardioversionswho was seen for the evaluation of decreased EF and A. fibat the request of Dr. Evette Doffing.  Assessment & Plan    1. 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 Follow-up EF 2019 showed EF60-65% so no ischemic work-up was pursued. - Echo this admission EF 35-40% with global hypokinesis, moderately reduced RV function with RV systolic pressure 35 mmHg, mild to mod MR - HS troponin trend flat and not consistent with ACS. Suspect decompensation was related to Afib RVR.  - has been on Toprol since admission but now bradycardiac and soft blood pressure, therefore will stop.  2. Acute on chronic systolic and diastolic HF: EF in [id number]. EF this admission lower 35-40%. BNP elevated to over 1000. CXR with pulmonary edema. She has been diuresed with IV lasix. Net -2.4, weight is trending down.  -- had an episode yesterday afternoon with acute hypoxia, lasix now restarted. Will continue for now with close monitoring of renal function.  3. Persistent Afib: On Eliquis 5 mg BID prior  to admission for stroke prophylaxis. Amiodarone was increased 100mg  BID and Toprol 12.5mg  added. -- had successful DCCV yesterday and now bradycardiac. Will stop Toprol for now -- Eliquis now dose reduced to 2.5mg  BID  4. AKI - 1.06 > 1.49>>1.42>>1.62>>1.57 - baseline around 1.2  5. Mild to moderate MR - per echo  6. H/o of stroke - on Eliquis  For questions or updates, please contact [place] Please consult [email or web address] for contact info under   Signed, Reino Bellis, NP  11/25/2019, 10:08 AM    I have personally seen and examined this patient. I agree with the assessment and  plan as outlined above.  She looks better this morning. I think she is still mildly volume overloaded. I would agree with more Lasix today. I do not think her decompensation last night was due to coronary ischemia. Troponin is negative. LV systolic dysfunction is thought to be due to uncontrolled atrial fib at home. Most likely flash pulmonary edema post cardioversion due to lack of atrial kick. Sinus this am but bradycardic. Will hold beta blocker.  No other recommendations. Check BMET in am.   Lauree Chandler 11/25/2019 11:14 AM

## 2019-11-25 NOTE — Evaluation (Signed)
Occupational Therapy Evaluation Patient Details Name: Teresa York MRN: [id number] DOB: Feb 16, 1941 Today's Date: 11/25/2019    History of Present Illness Pt is a 79 y.o. F with significant PMH of stroke, afib, CHF, who presents with SOB, elevated HR, and hypokalemia. Received IV lasix in ED. S/P cardioversion 2/16.    Clinical Impression   PTA patient independent with ADLs, mobility using rollator and spouse assisting with IADls. Admitted for above and limited by problem list below, including impaired balance, decreased activity tolerance and generalized weakness. Patient currently requires min assist to supervision for ADLs, min assist for transfers and min guard for mobility.  Patient on 2L via [place] with VSS during session.  Teresa York will benefit from continued OT services while admitted in order to optimize independence with ADLs, mobility and transfers. Anticipate no further needs after dc home.     Follow Up Recommendations  Supervision/Assistance - 24 hour;No OT follow up    Equipment Recommendations  None recommended by OT    Recommendations for Other Services       Precautions / Restrictions Precautions Precautions: Fall Restrictions Weight Bearing Restrictions: No      Mobility Bed Mobility Overal bed mobility: Needs Assistance Bed Mobility: Supine to Sit;Sit to Supine     Supine to sit: Supervision;HOB elevated Sit to supine: Supervision   General bed mobility comments: increased time and effort   Transfers Overall transfer level: Needs assistance Equipment used: 4-wheeled walker Transfers: Sit to/from Stand Sit to Stand: Min assist         General transfer comment: min assist to power up and steady, cueing for hand placement      Balance Overall balance assessment: Needs assistance Sitting-balance support: No upper extremity supported;Feet supported Sitting balance-Leahy Scale: Good     Standing balance support: No upper extremity supported;During functional  activity;Bilateral upper extremity supported Standing balance-Leahy Scale: Fair Standing balance comment: preference to UE support dynamically, able to stand statically without support                           ADL either performed or assessed with clinical judgement   ADL Overall ADL's : Needs assistance/impaired     Grooming: Min guard;Standing   Upper Body Bathing: Set up;Sitting   Lower Body Bathing: Minimal assistance;Sit to/from stand   Upper Body Dressing : Sitting;Minimal assistance Upper Body Dressing Details (indicate cue type and reason): to don robe Lower Body Dressing: Minimal assistance;Sit to/from stand   Toilet Transfer: Minimal assistance;Ambulation(rollator)           Functional mobility during ADLs: Minimal assistance(rollator) General ADL Comments: pt limited by decreased activity tolerance and generalized weakness     Vision         Perception     Praxis      Pertinent Vitals/Pain Pain Assessment: 0-10 Pain Score: 6  Pain Location: back  Pain Descriptors / Indicators: Discomfort Pain Intervention(s): Monitored during session;Repositioned     Hand Dominance Right   Extremity/Trunk Assessment Upper Extremity Assessment Upper Extremity Assessment: Generalized weakness   Lower Extremity Assessment Lower Extremity Assessment: Defer to PT evaluation   Cervical / Trunk Assessment Cervical / Trunk Assessment: Kyphotic   Communication Communication Communication: No difficulties   Cognition Arousal/Alertness: Awake/alert Behavior During Therapy: WFL for tasks assessed/performed Overall Cognitive Status: Within Functional Limits for tasks assessed  General Comments  pt on 2L O2 via [place] with VSS during session    Exercises     Shoulder Instructions      Home Living Family/patient expects to be discharged to:: Private residence Living Arrangements: Spouse/significant  other Available Help at Discharge: Family;Available 24 hours/day Type of Home: House Home Access: Stairs to enter [employer] of Steps: 3 Entrance Stairs-Rails: Right;Left Home Layout: One level     Bathroom Shower/Tub: Teacher, early years/pre: Handicapped height     Home Equipment: Environmental consultant - 4 wheels;Tub bench;Bedside commode;Wheelchair - manual          Prior Functioning/Environment Level of Independence: Needs assistance  Gait / Transfers Assistance Needed: Uses Rollator, requires assist for steps ADL's / Homemaking Assistance Needed: independent ADLs, Husband assists with IADL's i.e. medication management, cooking due to visual impairment            OT Problem List: Decreased strength;Decreased activity tolerance;Impaired balance (sitting and/or standing);Decreased knowledge of use of DME or AE;Cardiopulmonary status limiting activity      OT Treatment/Interventions: Self-care/ADL training;DME and/or AE instruction;Therapeutic activities;Balance training;Patient/family education;Therapeutic exercise;Energy conservation    OT Goals(Current goals can be found in the care plan section) Acute Rehab OT Goals Patient Stated Goal: to feel better OT Goal Formulation: With patient Time For Goal Achievement: 12/09/19 Potential to Achieve Goals: Good  OT Frequency: Min 2X/week   Barriers to D/C:            Co-evaluation PT/OT/SLP Co-Evaluation/Treatment: Yes Reason for Co-Treatment: Other (comment)(activity tolerance )   OT goals addressed during session: ADL's and self-care      AM-PAC OT "6 Clicks" Daily Activity     Outcome Measure Help from another person eating meals?: None Help from another person taking care of personal grooming?: A Little Help from another person toileting, which includes using toliet, bedpan, or urinal?: A Little Help from another person bathing (including washing, rinsing, drying)?: A Little Help from another person to  put on and taking off regular upper body clothing?: A Little Help from another person to put on and taking off regular lower body clothing?: A Little 6 Click Score: 19   End of Session Equipment Utilized During Treatment: Oxygen(2L, rollator) Nurse Communication: Mobility status  Activity Tolerance: Patient limited by fatigue Patient left: in bed;with call bell/phone within reach;with bed alarm set;with family/visitor present  OT Visit Diagnosis: Other abnormalities of gait and mobility (R26.89);Muscle weakness (generalized) (M62.81)                Time: [id number] OT Time Calculation (min): 21 min Charges:  OT General Charges $OT Visit: 1 Visit OT Evaluation $OT Eval Moderate Complexity: 1 Mod  Jolaine Artist, OT Acute Rehabilitation Services Pager [phone] Office [phone]    Delight Stare 11/25/2019, 3:04 PM

## 2019-11-25 NOTE — Procedures (Signed)
Patient states that she does not wear CPAP at home.  Declined CPAP tonight.

## 2019-11-25 NOTE — Plan of Care (Signed)
  Problem: Safety: Goal: Ability to remain free from injury will improve Outcome: Progressing   

## 2019-11-25 NOTE — Progress Notes (Signed)
Physical Therapy Treatment Patient Details Name: Teresa York MRN: [id number] DOB: May 11, 1941 Today's Date: 11/25/2019    History of Present Illness Pt is a 79 y.o. F with significant PMH of stroke, afib, CHF, who presents with SOB, elevated HR, and hypokalemia. Received IV lasix in ED. S/P cardioversion 2/16.     PT Comments    Patient seen for mobility progression. Pt tolerated shorter distance gait compared to previous session due to fatigue. VSS on 2L O2 via [place] although pt SOB with mobility. PT will continue to follow acutely and progress as tolerated.    Follow Up Recommendations  No PT follow up;Supervision/Assistance - 24 hour     Equipment Recommendations  None recommended by PT    Recommendations for Other Services       Precautions / Restrictions Precautions Precautions: Fall Restrictions Weight Bearing Restrictions: No    Mobility  Bed Mobility Overal bed mobility: Needs Assistance Bed Mobility: Supine to Sit;Sit to Supine     Supine to sit: Supervision;HOB elevated Sit to supine: Supervision   General bed mobility comments: increased time and effort   Transfers Overall transfer level: Needs assistance Equipment used: 4-wheeled walker Transfers: Sit to/from Stand Sit to Stand: Min assist         General transfer comment: min assist to power up and steady, cueing for hand placement    Ambulation/Gait Ambulation/Gait assistance: Min guard Gait Distance (Feet): (80 ft X 2 trials with seated break) Assistive device: 4-wheeled walker Gait Pattern/deviations: Step-through pattern;Decreased stride length;Trunk flexed Gait velocity: decreased   General Gait Details: cues for PLB; seated break due to fatigue/SOB; SpO2 >90% on 2L O2 via [place] and HR in 60s   Stairs             Wheelchair Mobility    Modified Rankin (Stroke Patients Only)       Balance Overall balance assessment: Needs assistance Sitting-balance support: No upper extremity  supported;Feet supported Sitting balance-Leahy Scale: Good     Standing balance support: No upper extremity supported;During functional activity;Bilateral upper extremity supported Standing balance-Leahy Scale: Fair Standing balance comment: preference to UE support dynamically, able to stand statically without support                            Cognition Arousal/Alertness: Awake/alert Behavior During Therapy: WFL for tasks assessed/performed Overall Cognitive Status: Within Functional Limits for tasks assessed                                        Exercises      General Comments General comments (skin integrity, edema, etc.): pt on 2L O2 via [place] with VSS during session      Pertinent Vitals/Pain Pain Assessment: 0-10 Pain Score: 6  Pain Location: back  Pain Descriptors / Indicators: Discomfort Pain Intervention(s): Monitored during session;Repositioned    Home Living Family/patient expects to be discharged to:: Private residence Living Arrangements: Spouse/significant other Available Help at Discharge: Family;Available 24 hours/day Type of Home: House Home Access: Stairs to enter Entrance Stairs-Rails: Right;Left Home Layout: One level Home Equipment: Environmental consultant - 4 wheels;Tub bench;Bedside commode;Wheelchair - manual      Prior Function Level of Independence: Needs assistance  Gait / Transfers Assistance Needed: Uses Rollator, requires assist for steps ADL's / Homemaking Assistance Needed: independent ADLs, Husband assists with IADL's i.e. 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     PT Goals (current goals can now be found in the care plan section) Acute Rehab PT Goals Patient Stated Goal: to feel better Progress towards PT goals: Progressing toward goals    Frequency    Min 3X/week      PT Plan Current plan remains appropriate    Co-evaluation PT/OT/SLP Co-Evaluation/Treatment: Yes Reason for Co-Treatment: Other  (comment)(activity tolerance) PT goals addressed during session: Mobility/safety with mobility OT goals addressed during session: ADL's and self-care      AM-PAC PT "6 Clicks" Mobility   Outcome Measure  Help needed turning from your back to your side while in a flat bed without using bedrails?: None Help needed moving from lying on your back to sitting on the side of a flat bed without using bedrails?: None Help needed moving to and from a bed to a chair (including a wheelchair)?: None Help needed standing up from a chair using your arms (e.g., wheelchair or bedside chair)?: None Help needed to walk in hospital room?: None Help needed climbing 3-5 steps with a railing? : A Little 6 Click Score: 23    End of Session Equipment Utilized During Treatment: Gait belt Activity Tolerance: Patient tolerated treatment well Patient left: in bed;with call bell/phone within reach;with bed alarm set;with family/visitor present Nurse Communication: Mobility status PT Visit Diagnosis: Unsteadiness on feet (R26.81);Muscle weakness (generalized) (M62.81);Difficulty in walking, not elsewhere classified (R26.2)     Time: [id number] PT Time Calculation (min) (ACUTE ONLY): 21 min  Charges:  $Gait Training: 8-22 mins                     Earney Navy, PTA Acute Rehabilitation Services Pager: [phone] Office: [phone]     Darliss Cheney 11/25/2019, 4:07 PM

## 2019-11-25 NOTE — Progress Notes (Signed)
Pharmacist Heart Failure Core Measure Documentation  Assessment: Teresa York has an EF documented as 35 to 40% on 11/22/19 by Echo.  Rationale: Heart failure patients with left ventricular systolic dysfunction (LVSD) and an EF < 40% should be prescribed an angiotensin converting enzyme inhibitor (ACEI) or angiotensin receptor blocker (ARB) at discharge unless a contraindication is documented in the medical record.  This patient is not currently on an ACEI or ARB for HF.  This note is being placed in the record in order to provide documentation that a contraindication to the use of these agents is present for this encounter.  ACE Inhibitor or Angiotensin Receptor Blocker is contraindicated (specify all that apply)  []   ACEI allergy AND ARB allergy []   Angioedema []   Moderate or severe aortic stenosis []   Hyperkalemia []   Hypotension []   Renal artery stenosis [x]   Worsening renal function, preexisting renal disease or dysfunction  Hildred Laser, PharmD Clinical Pharmacist **Pharmacist phone directory can now be found on [email or web address] ([email or web address]).  Listed under [place].

## 2019-11-25 NOTE — Progress Notes (Addendum)
Subjective:  Successful cardioversion yesterday afternoon. Transient bradycardia and hypotension afterward resolved, temporarily managed with phenylephrine.  Rapid Response yesterday evening (gathered from chart review):  Patient with SOB, nausea and RUE discomfort.  99% on 2L, very mild tachypnea, skin warm and dry, good palpable pulses in all extremities. SBP 80s -90s but improved to 120s.  Team ordered CXR, ABG, EKG, and STAT labs.  Cardiology paged; ordered 40 mg IV lasix BID. Thromboembolism unlikely given that she has been on long term anticoagulation.  Patient's abg showing primary respiratory alkalosis and chest xray depicting increased interstitial edema from prior. These are concerning findings for pulmonary edema. Patient to receive IV lasix and monitor for clinical improvement. Troponin <10.  8pm,  Satting 98% on 2L O2. Lungs clear. Pt urinating fine after lasix given.  This morning:  Per RN, patient feels better this morning, but still has some CP.   Patient slightly tachypnic on 2 L [place] this morning.  She continues to have some residual CP this morning and is very tired.  Overall, she does not feel well.    Objective:  Vital signs in last 24 hours: Vitals:   11/24/19 2100 11/24/19 2230 11/24/19 2300 11/25/19 0425  BP: (!) 107/48 (!) 110/41 105/66 118/66  Pulse: 67 64 62 60  Resp: 20 20 18    Temp: (!) 97.3 F (36.3 C)  98.2 F (36.8 C) 98 F (36.7 C)  TempSrc: Oral  Oral Oral  SpO2: 97% 98% 97% 97%  Weight:    59.9 kg  Height:       Weight change:    2/14 61.6 kg --> 2/15 60.3 kg --> 59.9 kg on 2/17 Wt Readings from Last 3 Encounters:  11/25/19 59.9 kg  06/16/19 61.1 kg  06/01/19 59 kg     Intake/Output Summary (Last 24 hours) at 11/25/2019 [id number] Last data filed at 11/25/2019 0500 Gross per 24 hour  Intake 420 ml  Output 800 ml  Net -380 ml   Physical Exam: Gen:  Elderly woman lying in bed, appears tired, tachypneic HEENT:  Atraumatic, normocephalic, [place] in  place Cardio:  Regular rhythm, bradycardic in the [id number], 2/6 systolic murmur in LUSB, no rub or gallops, JVD ~5 cm above sternum Pulm:  bibasilar crackles. Slight tachypnea on 2 L [place], normal work of breathing Extremities:  Trace lower extremity edema   Neuro: normal speech, mentation clear  Labs:  CBC Latest Ref Rng & Units 11/25/2019 11/23/2019 11/22/2019  WBC 4.0 - 10.5 K/uL 11.2(H) 8.5 6.3  Hemoglobin 12.0 - 15.0 g/dL 8.9(L) 9.1(L) 9.1(L)  Hematocrit 36.0 - 46.0 % 30.4(L) 31.4(L) 30.6(L)  Platelets 150 - 400 K/uL 303 339 344    CMP Latest Ref Rng & Units 11/24/2019 11/24/2019 11/23/2019  Glucose 70 - 99 mg/dL 110(H) 96 103(H)  BUN 8 - 23 mg/dL 23 19 14   Creatinine 0.44 - 1.00 mg/dL 1.62(H) 1.42(H) 1.49(H)  Sodium 135 - 145 mmol/L 134(L) 137 140  Potassium 3.5 - 5.1 mmol/L 4.9 3.5 4.1  Chloride 98 - 111 mmol/L 102 99 104  CO2 22 - 32 mmol/L 20(L) 26 24  Calcium 8.9 - 10.3 mg/dL 9.0 9.1 9.4  Total Protein 6.5 - 8.1 g/dL 7.1 - -  Total Bilirubin 0.3 - 1.2 mg/dL 1.1 - -  Alkaline Phos 38 - 126 U/L 79 - -  AST 15 - 41 U/L 31 - -  ALT 0 - 44 U/L 16 - -     ABG:  Ref Range &  Units 1 d ago  FIO2  28.00   pH, Arterial 7.350 - 7.450 7.474High    pCO2 arterial 32.0 - 48.0 mmHg 31.7Low    pO2, Arterial 83.0 - 108.0 mmHg 100   Bicarbonate 20.0 - 28.0 mmol/L 23.1   Acid-base deficit 0.0 - 2.0 mmol/L 0.1   O2 Saturation % 98.2   Patient temperature  36.6   Collection site  LEFT RADIAL   Drawn by  [id number]   Sample type  ARTERIAL DRAW   Allens test (pass/fail) PASS PASS      Troponin: Ref Range & Units 1 d ago  (11/24/19) 3 d ago  (11/22/19) 3 d ago  (11/22/19) 3 d ago  (11/22/19)  Troponin I (High Sensitivity) <18 ng/L 10  10 CM  12 CM  16 CM     Assessment/Plan:  Principal Problem:   Acute HFrEF (heart failure with reduced ejection fraction) (HCC) Active Problems:   Atrial fibrillation with RVR (HCC)   Chest tightness   Acute on chronic HFrEF (heart failure with reduced  ejection fraction) (HCC)   Atrial fibrillation (HCC)   CKD (chronic kidney disease) stage 3, GFR 30-59 ml/min   Teresa York is an 79 yo female with hx of Barrett's esophagus without dysplasia, atrial fibrillation, combined systolic and diastolic CHF, HTN, hypothyroidism, stroke (2019) who presented to to the ED with worsening SOB and chest pain, and was admitted for acute heart failure with reduced ejection fraction.   #Acute HFrEF due to Atrial Fibrillation:   Echo showed reduced EF 35-40% and LV global hypokinesis, and severe dilation of LA and RA.    Acute heart failure likely caused by afib with RVR.  Following cardioversion on 2/16, patient developed hypotension and bradycardia, which was managed with phenylephrine by anesthesia in PACU.  This episode likely led to flash pulmonary edema and her subsequent respiratory decompensation. Slightly improved this morning after IV Lasix 40mg .  Still appears moderately volume overloaded on exam with JVD and bibasilar crackles. Will continue IV Lasix 40 mg BID.     - Cardioversion 2/16, maintaining sinus rhythm -  Amiodarone 100 mg BID (home dose 100 mg qam) -  IV Lasix 40 mg BID -  STOP Metoprolol tartrate 12.5 mg BID given bradycardia -  Continue home Eliquis 5 mg twice daily -  Continue telemetry -  Strict Is/Os -  Daily Weights -  PT eval 2/14-- Recommendation 3x weekly.  No equipment recommendations.    AKI on CKD 3a:  Likely related to vascular congestion from acute decompensated heart failure, will continue diuresis as above   Hypokalemia, resolved:  3.3 on admission, repleted in ED.  Currently wnl. -  Replete as needed  Increased nutrient needs related to chronic illness (CHF)  Patient has lost over 100 lbs over the past 2 years.  Appreciate nutrition recommendations (remote service) -  MVI with minerals daily -  Ensure Enlive po BID with meals  #Microcytic anemia:  Likely iron deficiency anemia with low Fe and low ferritin. H/H is  stable from 5 months ago.  -  Consider IV iron infusion for iron deficiency in HFrEF -  Will need to ensure close follow up to evaluate etiology of iron deficiency  #Hx of stroke (Feb 2019) - Continue home Eliquis  #Hypothyroidism:  TSH wnl 2/14 -  Continue home levothyroxine 175 mcg   LOS: 2 days   Teresa York, Medical Student 11/25/2019, 7:23 AM

## 2019-11-26 ENCOUNTER — Inpatient Hospital Stay (HOSPITAL_COMMUNITY): Payer: PPO

## 2019-11-26 DIAGNOSIS — I484 Atypical atrial flutter: Secondary | ICD-10-CM

## 2019-11-26 DIAGNOSIS — N183 Chronic kidney disease, stage 3 unspecified: Secondary | ICD-10-CM

## 2019-11-26 DIAGNOSIS — I4819 Other persistent atrial fibrillation: Secondary | ICD-10-CM

## 2019-11-26 LAB — BASIC METABOLIC PANEL
Anion gap: 12 (ref 5–15)
BUN: 27 mg/dL — ABNORMAL HIGH (ref 8–23)
CO2: 25 mmol/L (ref 22–32)
Calcium: 9.3 mg/dL (ref 8.9–10.3)
Chloride: 98 mmol/L (ref 98–111)
Creatinine, Ser: 1.57 mg/dL — ABNORMAL HIGH (ref 0.44–1.00)
GFR calc Af Amer: 36 mL/min — ABNORMAL LOW (ref >60)
GFR calc non Af Amer: 31 mL/min — ABNORMAL LOW (ref >60)
Glucose, Bld: 102 mg/dL — ABNORMAL HIGH (ref 70–99)
Potassium: 3.2 mmol/L — ABNORMAL LOW (ref 3.5–5.1)
Sodium: 135 mmol/L (ref 135–145)

## 2019-11-26 LAB — CBC
HCT: 31 % — ABNORMAL LOW (ref 36.0–46.0)
Hemoglobin: 9.1 g/dL — ABNORMAL LOW (ref 12.0–15.0)
MCH: 22.1 pg — ABNORMAL LOW (ref 26.0–34.0)
MCHC: 29.4 g/dL — ABNORMAL LOW (ref 30.0–36.0)
MCV: 75.2 fL — ABNORMAL LOW (ref 80.0–100.0)
Platelets: 305 10*3/uL (ref 150–400)
RBC: 4.12 MIL/uL (ref 3.87–5.11)
RDW: 17.3 % — ABNORMAL HIGH (ref 11.5–15.5)
WBC: 11.3 10*3/uL — ABNORMAL HIGH (ref 4.0–10.5)
nRBC: 0 % (ref 0.0–0.2)

## 2019-11-26 LAB — PROCALCITONIN: Procalcitonin: 0.11 ng/mL

## 2019-11-26 LAB — C-REACTIVE PROTEIN: CRP: 12.5 mg/dL — ABNORMAL HIGH (ref <1.0)

## 2019-11-26 MED ORDER — POTASSIUM CHLORIDE CRYS ER 20 MEQ PO TBCR
30.0000 meq | EXTENDED_RELEASE_TABLET | ORAL | Status: AC
  Administered 2019-11-26 (×2): 30 meq via ORAL
  Filled 2019-11-26 (×2): qty 1

## 2019-11-26 MED ORDER — SODIUM CHLORIDE 0.9 % IV SOLN
510.0000 mg | Freq: Once | INTRAVENOUS | Status: AC
  Administered 2019-11-26: 510 mg via INTRAVENOUS
  Filled 2019-11-26: qty 17

## 2019-11-26 MED ORDER — AMIODARONE HCL 200 MG PO TABS
400.0000 mg | ORAL_TABLET | Freq: Every day | ORAL | Status: DC
  Administered 2019-11-26 – 2019-12-02 (×7): 400 mg via ORAL
  Filled 2019-11-26 (×7): qty 2

## 2019-11-26 MED ORDER — METOPROLOL TARTRATE 12.5 MG HALF TABLET
12.5000 mg | ORAL_TABLET | Freq: Two times a day (BID) | ORAL | Status: DC
  Administered 2019-11-26 – 2019-11-27 (×3): 12.5 mg via ORAL
  Filled 2019-11-26 (×3): qty 1

## 2019-11-26 MED ORDER — POTASSIUM CHLORIDE CRYS ER 20 MEQ PO TBCR
40.0000 meq | EXTENDED_RELEASE_TABLET | Freq: Once | ORAL | Status: AC
  Administered 2019-11-26: 40 meq via ORAL
  Filled 2019-11-26: qty 2

## 2019-11-26 MED ORDER — TECHNETIUM TO 99M ALBUMIN AGGREGATED
1.5500 | Freq: Once | INTRAVENOUS | Status: AC | PRN
  Administered 2019-11-26: 1.55 via INTRAVENOUS

## 2019-11-26 NOTE — Consult Note (Addendum)
Cardiology Consultation:   Patient ID: Teresa York MRN: [id number]; DOB: [birth year]  Admit date: 11/22/2019 Date of Consult: 11/26/2019  Primary Care Provider: Patrecia Pour, Christean Grief, MD Primary Cardiologist: Lauree Chandler, MD  Primary Electrophysiologist:  Thompson Grayer, MD    Patient Profile:   Teresa York is a 79 y.o. 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 [name].  AFib Hx DCCV, 11/02/16, 11/16/16, 01/02/17 H/o GIB on Xarelto Jan-Feb 2019 AAD:  2018, amiodarone (current)  History of Present Illness:   Teresa York was last seen out patient by myself, back in Aug 2020.  AT that time, no symptoms of AFib, though was having falls.  This was felt multifactorial with residual defecit from her stroke as well as reluctance/non-use of cane, walker, assist devices.  Disussed perhaps need to stop [place] if falls continued, otherwise no changes were made.  She was admitted to [facility]/15/2021with c/o chest pressure, SOB, and palpitations with observations of HR at home 130s In the ER she was in AFib low 100's, some edema on her CXR, admitted started on IV lasix.  TTE done noted LVEF again reduced 35-40% Cardiology consulted to the case Suspected her reduced EF again 2/2 RVR, home amio increased from 100mg  daily to 200mg  daily Underwent DCCV 11/24/2019 to SB, post procedure required brief phenylephrine for hypotension Last evening had recurrent AFib 110's-120's EP is asked to weigh in.  LABS K+ 3.2 (replaced) BUN/Creat 27/1.57 (baseline about 1-1.1) HS Trop 10 WBC 11.3 H/H 9/31 (looks about her baseline) Plts 305  She tells me she has felt she was out of rhythm for about 3 weeks, with prgressive fatigue, weakness,  and then SOB/DOE. She remains feeling very weak, no CP, she can tell she is in AF again. "I just feel so weak" She is a little SOB at rest, not much better then when she came   In pt Rate/rhythm meds Amiodarone 100mg  BID Metoprolol 12.5mg  BID  Heart Pathway Score:     Past Medical History:  Diagnosis Date   Allergic rhinitis    Anxiety    Atrial fibrillation ([place])    Barrett's esophagus    Breast cancer ([place]) 2005   "left"   Chronic lower back pain    Chronic systolic CHF (congestive heart failure) ([place])    a. 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 s/p failed DCCV x3. Now on amiodarone.    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)   Stroke (cerebrum) ([place]) 11/13/2017   Stroke [facility])     Past Surgical History:  Procedure Laterality Date   BACK SURGERY     BREAST BIOPSY Left 2005   BREAST LUMPECTOMY Left 2005   CARDIOVERSION N/A 11/02/2016   Procedure: CARDIOVERSION;  Surgeon: Thayer Headings, MD;  Location: [place];  Service: Cardiovascular;  Laterality: N/A;   CARDIOVERSION N/A 11/16/2016   Procedure: CARDIOVERSION;  Surgeon: Fay Records, MD;  Location: [place];  Service: Cardiovascular;  Laterality: N/A;   CARDIOVERSION N/A 12/24/2016   Procedure: CARDIOVERSION;  Surgeon: Sanda Klein, MD;  Location: [place];  Service: Cardiovascular;  Laterality: N/A;   CARPAL TUNNEL RELEASE Bilateral 1995   CHOLECYSTECTOMY  08/2018   COLONOSCOPY     ELBOW FRACTURE SURGERY Right 2009   with implant   ESOPHAGOGASTRODUODENOSCOPY     ESOPHAGUS SURGERY     "in the process of getting  cancerous cell off my esophagus" (10/31/2016)   FRACTURE SURGERY     IR CT HEAD LTD  11/14/2017   IR PERCUTANEOUS ART THROMBECTOMY/INFUSION INTRACRANIAL INC DIAG ANGIO  11/14/2017   LUMBAR DISC SURGERY     L5   MOHS SURGERY     "corner of right eye; on left cheek"   RADIOLOGY WITH ANESTHESIA N/A 11/13/2017   Procedure: IR WITH ANESTHESIA;  Surgeon: Radiologist, Medication, MD;  Location: [place];  Service: Radiology;  Laterality: N/A;   TEE WITHOUT CARDIOVERSION N/A 11/16/2016   Procedure: TRANSESOPHAGEAL ECHOCARDIOGRAM (TEE);  Surgeon: Fay Records, MD;  Location: [place];  Service: Cardiovascular;  Laterality: N/A;   [place] Medications:  Prior to Admission medications   Medication Sig Start Date End Date Taking? Authorizing Provider  acetaminophen (TYLENOL) 325 MG tablet Take 650 mg by mouth 2 (two) times daily as needed for mild pain.    Yes [provider]  amiodarone (PACERONE) 200 MG tablet Take 0.5 tablets (100 mg total) by mouth daily. 11/17/18  Yes Allred, Jeneen Rinks, MD  apixaban (ELIQUIS) 5 MG TABS tablet Take 1 tablet (5 mg total) by mouth 2 (two) times daily. 08/04/19  Yes Allred, Jeneen Rinks, MD  cetirizine (ZYRTEC) 10 MG tablet Take 10 mg by mouth daily. Reported on 10/27/2015   Yes [provider]  Cholecalciferol (D 1000) 1000 units capsule Take 1,000 Units by mouth daily. Reported on 11/03/2015 11/30/14  Yes [provider]  diphenhydramine-acetaminophen (TYLENOL PM) 25-500 MG TABS tablet Take 1 tablet by mouth at bedtime as needed (sleep).   Yes [provider]  FLUoxetine (PROZAC) 40 MG capsule Take 40 mg by mouth daily.   Yes [provider]  fluticasone (FLONASE) 50 MCG/ACT nasal spray Place 1 spray into both nostrils daily as needed for allergies.  12/27/17  Yes [provider]  latanoprost (XALATAN) 0.005 % ophthalmic solution Place 1 drop into both eyes at bedtime. 11/12/19  Yes [provider]  levothyroxine (SYNTHROID) 175 MCG tablet Take 175 mcg by mouth daily before breakfast.    Yes [provider]  Magnesium Oxide (MAGOX 400 PO) Take 400 mg by mouth daily.    Yes [provider]  meclizine (ANTIVERT) 25 MG tablet Take 25 mg by mouth 3 (three) times daily as needed for dizziness or nausea.  06/02/18  Yes [provider]  Multiple Vitamins-Minerals (ICAPS) CAPS Take 1 capsule by mouth 2 (two) times daily.    Yes [provider]  omeprazole (PRILOSEC) 40 MG capsule Take 40 mg by mouth at bedtime.    Yes [provider]  ondansetron (ZOFRAN) 4 MG tablet Take 4 mg by mouth every 8 (eight) hours as needed for nausea or vomiting.  06/02/18  Yes [provider]  potassium chloride (KLOR-CON) 10 MEQ  tablet Take 10 mEq by mouth daily.   Yes [provider]  Probiotic Product (DAILY PROBIOTIC) CAPS Take 1 tablet by mouth daily.   Yes [provider]  spironolactone (ALDACTONE) 50 MG tablet Take 50 mg by mouth daily as needed (ankle swelling).   Yes [provider]  traZODone (DESYREL) 150 MG tablet Take 75 mg by mouth at bedtime. 02/06/18  Yes [provider]    Inpatient Medications: Scheduled Meds:  amiodarone  100 mg Oral BID   apixaban  2.5 mg Oral BID   cholecalciferol  1,000 Units Oral Daily   feeding supplement (ENSURE ENLIVE)  237 mL Oral QHS   feeding supplement (PRO-STAT SUGAR FREE 64)  30 mL Oral BID   FLUoxetine  40 mg Oral Daily   furosemide  40 mg Intravenous Q12H   latanoprost  1 drop Both Eyes QHS   levothyroxine  175 mcg Oral Q0600   metoprolol tartrate  12.5 mg Oral BID   multivitamin with minerals  1 tablet Oral Daily   pantoprazole  80 mg Oral Daily   potassium chloride  40 mEq Oral Once   traZODone  75 mg Oral QHS   Continuous Infusions:   PRN Meds: acetaminophen **OR** acetaminophen, promethazine, senna-docusate  Allergies:    Allergies  Allergen  Reactions   Benazepril Cough    Cough   Diltiazem Hcl Rash    Social History:   Social History   Socioeconomic History   Marital status: Married    Spouse name: Not on file   Number of children: 2   Years of education: Not on file   Highest education level: Not on file  Occupational History   Occupation: Retired    Fish farm manager: RETIRED  Tobacco Use   Smoking status: Never Smoker   Smokeless tobacco: Never Used  Substance and Sexual Activity   Alcohol use: Yes    Alcohol/week: 0.0 standard drinks    Comment: 1-2 per week   Drug use: No   Sexual activity: Not Currently  Other Topics Concern   Not on file  Social History Narrative   Not on file   Social Determinants of Health   Financial Resource Strain:    Difficulty of Paying Living Expenses: Not on file  Food Insecurity:    Worried About [place] in the Last Year: Not on file   Ran Out of Food in the Last Year: Not on file  Transportation Needs:    Lack of Transportation (Medical): Not on file   Lack of Transportation (Non-Medical): Not on file  Physical Activity:    Days of Exercise per Week: Not on file   Minutes of Exercise per Session: Not on file  Stress:    Feeling of Stress : Not on file  Social Connections:    Frequency of Communication with Friends and Family: Not on file   Frequency of Social Gatherings with Friends and Family: Not on file   Attends Religious Services: Not on file   Active Member of Clubs or Organizations: Not on file   Attends Archivist Meetings: Not on file   Marital Status: Not on file  Intimate Partner Violence:    Fear of Current or Ex-Partner: Not on file   Emotionally Abused: Not on file   Physically Abused: Not on file   Sexually Abused: Not on file    Family History:   Family History  Problem Relation Age of Onset   Heart attack Father  95   Congestive Heart Failure Father    Thyroid cancer Sister    Breast cancer Daughter    Bone cancer Daughter         Ewing sarcoma   Colon cancer Neg Hx    Esophageal cancer Neg Hx    Stomach cancer Neg Hx      ROS:  Please see the history of present illness.  All other ROS reviewed and negative.     Physical Exam/Data:   Vitals:   11/25/19 1542 11/25/19 2034 11/26/19 0456 11/26/19 1142  BP:  (!) 105/44 (!) 135/54 121/65  Pulse:  67 99 (!) 105  Resp:  18 20 17   Temp:  97.7 F (36.5 C) 98.7 F (37.1 C) 98 F (36.7 C)  TempSrc:  Oral Oral Oral  SpO2: 94% 97% 90% 95%  Weight:   59.5 kg   Height:        Intake/Output Summary (Last 24 hours) at 11/26/2019 1249 Last data filed at 11/26/2019 0900 Gross per 24 hour  Intake 1230 ml  Output 2075 ml  Net -845 ml   Last 3 Weights 11/26/2019 11/25/2019 11/24/2019  Weight (lbs) 131 lb 2.8 oz 132 lb 131 lb 2.8 oz  Weight (kg) 59.5 kg 59.875 kg 59.5 kg     Body mass index is 24.79 kg/m.  General:  Thin, frail in appearance, in no acute distress HEENT: normal Lymph: no adenopathy Neck: no JVD Endocrine:  No thryomegaly Vascular: No carotid bruits  Cardiac:  irreg-irreg; soft SM, no gallops or rubs Lungs:  Crackles L to mid lung, no wheezing, rhonchi or rales  Abd: soft, nontender Ext: no edema Musculoskeletal:  No deformities,age appropriate/perhaps advanced atrophy Skin: warm and dry  Neuro:  No gross focal abnormalities noted Psych:  Normal affect   EKG:  The EKG was personally reviewed and demonstrates:    #1 Atypical AFlutter,coarse AFib 97bpm, RBBB, LAD SR 61bpm, qst degree AVblock, PR 224ms, RBBB, LAD  11/17/2018 SR 60bpm, 1st degree AVblock, PR 226ms, RBBB, LAD   Telemetry:  Telemetry was personally reviewed and demonstrates:   SR 70's >> AFib 110's-120's again last night     Relevant CV Studies:   TTE  11/22/19 1. Left ventricular ejection fraction, by estimation, is 35 to 40%. The  left ventricle has moderately decreased function. The left ventricle  demonstrates global hypokinesis. Left ventricular diastolic  function could  not be evaluated.   2. Right ventricular systolic function is moderately reduced. The right  ventricular size is normal. There is mildly elevated pulmonary artery  systolic pressure. The estimated right ventricular systolic pressure is  [id number] mmHg.   3. Left atrial size was severely dilated.   4. Right atrial size was severely dilated.   5. The mitral valve is normal in structure and function. Mild to moderate  mitral valve regurgitation.   6. The aortic valve is tricuspid. Aortic valve regurgitation is moderate.  Mild to moderate aortic valve sclerosis/calcification is present, without  any evidence of aortic stenosis.   7. The inferior vena cava is normal in size with <50% respiratory  variability, suggesting right atrial pressure of 8 mmHg.   11/15/17: TTE Study Conclusions  - Left ventricle: The cavity size was normal. Wall thickness was   increased in a pattern of mild LVH. Systolic function was normal.   The estimated ejection fraction was in the range of 60% to 65%.   Wall motion was normal; there were no regional wall motion  abnormalities. Doppler parameters are consistent with high   ventricular filling pressure. - Aortic valve: Valve mobility was restricted. There was moderate   regurgitation. - Mitral valve: Calcified annulus. There was moderate   regurgitation. - Left atrium: The atrium was moderately dilated. Volume/bsa, ES   (1-plane Simpson&'s, A4C): 47.4 ml/m^2. - Right atrium: The atrium was mildly dilated. - Tricuspid valve: There was mild regurgitation. - Pulmonary arteries: Systolic pressure was moderately increased.   PA peak pressure: 53 mm Hg (S).   Impressions: - No cardiac source of emboli was indentified.  Laboratory Data:  High Sensitivity Troponin:   Recent Labs  Lab 11/22/19 1122 11/22/19 1421 11/22/19 1547 11/24/19 1819  TROPONINIHS 16 12 10 10      Chemistry Recent Labs  Lab 11/24/19 2142 11/25/19 0825 11/26/19 0507    NA 134* 136 135  K 4.9 3.9 3.2*  CL 102 101 98  CO2 20* 23 25  GLUCOSE 110* 133* 102*  BUN 23 23 27*  CREATININE 1.62* 1.57* 1.57*  CALCIUM 9.0 9.2 9.3  GFRNONAA 30* 31* 31*  GFRAA 34* 36* 36*  ANIONGAP 12 12 12     Recent Labs  Lab 11/24/19 2142  PROT 7.1  ALBUMIN 3.0*  AST 31  ALT 16  ALKPHOS 79  BILITOT 1.1   Hematology Recent Labs  Lab 11/23/19 0445 11/25/19 0444 11/26/19 0507  WBC 8.5 11.2* 11.3*  RBC 4.13 3.97 4.12  HGB 9.1* 8.9* 9.1*  HCT 31.4* 30.4* 31.0*  MCV 76.0* 76.6* 75.2*  MCH 22.0* 22.4* 22.1*  MCHC 29.0* 29.3* 29.4*  RDW 17.4* 17.1* 17.3*  PLT 339 303 305   BNP Recent Labs  Lab 11/22/19 1122  BNP 1,012.5*    DDimer No results for input(s): DDIMER in the last 168 hours.   Radiology/Studies:   DG Chest 2 View Result Date: 11/25/2019 CLINICAL DATA:  Dyspnea. EXAM: CHEST - 2 VIEW COMPARISON:  Radiographs dated 11/22/2019 and 11/24/2019 and 08/26/2018 and 11/03/2017 and CT scan of the abdomen dated 12/11/2018 FINDINGS: There is progression of the infiltrate in the right upper lobe adjacent to the minor fissure since 11/24/2019. The diffuse slight interstitial accentuation throughout the left lung is stable. Stable mild cardiomegaly. Aortic atherosclerosis. Large chronic hiatal hernia. No acute bone abnormality. Multiple thoracic compression fractures, 2 of which have been treated with vertebroplasty. Chronic accentuation of the thoracic kyphosis. IMPRESSION: 1. Progressive infiltrate in the right upper lobe adjacent to the minor fissure. 2. Stable diffuse interstitial accentuation throughout the left lung. Electronically Signed   By: Lorriane Shire M.D.   On: 11/25/2019 13:33     Assessment and Plan:   1. Persistent AFib, atypical Flutter     CHA2DS2Vasc is 7, on Eliquis, dose reduced on admission to 2.5mg    She has had recurrent reduction in her LVEF, suspect 2/2 RVR She is s/p DCCV with ERAF this admission She is chronically on amiodarone  (100mg  daily) out patiet  Agree with uptitration of her amio Notes mention historically BB have had to be stopped 2/2 bradycardia No alternative AAD for her  LA this admission's echo is described as severely dilated, measured 29mm Given this and advanced age, I do not think she is a good PVI ablation candidate And likely hood of maintaining SR is reduced  An option to increase amio further and repeat DCCV Baseline conduction system Jo[place] limit this I suspect Vs Pace  + more aggressive medicines therapy,  +/- AV node ablationablate    Dr. Curt Bears Nell Schrack  see her later this afternoon   [place] dosing Baseline Creat is about 1-1.1 (current is 1.57) She has diuresed to < 60kg She is 78y/o Discharge dosing [place] depend on weight and Creat, suspect we [place] F[place] land at 2.5mg  BID   2. Acute/chronic HF     She c/w crackles on lkeft and SOB     cumulatively negative -3660ml     Weight down about 4lbs     She is pending VQ study   For questions or updates, please contact [place] Please consult [email or web address] for contact info under     Signed, Baldwin [place], PA-C  11/26/2019 12:49 PM  I have seen and examined this patient with Tommye Standard.  Agree with above, note added to reflect my findings.  On exam, irregular, no murmurs.  Patient admitted to the hospital with systolic heart failure.  She was found to be in atrial fibrillation.  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  She was on 100 mg of amiodarone and had a cardioversion but quickly went back into atrial fibrillation.  Arrian Manson increase her amiodarone dose to 400 mg.  She would likely need a repeat cardioversion on this dose.  If she does not maintain sinus rhythm, she may need pacemaker implant with increased rate controlling medicines versus AV nodal ablation.  Lavel Rieman M. Inez Rosato MD 11/26/2019 5:46 PM

## 2019-11-26 NOTE — Progress Notes (Signed)
PT Cancellation Note  Patient Details Name: Teresa York MRN: [id number] DOB: Oct 06, 1941   Cancelled Treatment:     Pt was declined PT in am due to eating and then going for V/Q scan.  In afternoon, declined PT due to reporting the test was awful and she doesn't feel up for anything else today.  Maggie Font, PT Acute Rehab Services Pager [phone] [facility] [phone]    Karlton Lemon 11/26/2019, 2:45 PM

## 2019-11-26 NOTE — Progress Notes (Addendum)
Progress Note  Patient Name: Teresa York Date of Encounter: 11/26/2019  Primary Cardiologist: Lauree Chandler, MD   Subjective   Went into afib last night. Feels weak and fatigue.   Inpatient Medications    Scheduled Meds: . amiodarone  100 mg Oral BID  . apixaban  2.5 mg Oral BID  . cholecalciferol  1,000 Units Oral Daily  . feeding supplement (ENSURE ENLIVE)  237 mL Oral QHS  . feeding supplement (PRO-STAT SUGAR FREE 64)  30 mL Oral BID  . FLUoxetine  40 mg Oral Daily  . furosemide  40 mg Intravenous Q12H  . latanoprost  1 drop Both Eyes QHS  . levothyroxine  175 mcg Oral Q0600  . metoprolol tartrate  12.5 mg Oral BID  . multivitamin with minerals  1 tablet Oral Daily  . pantoprazole  80 mg Oral Daily  . potassium chloride  40 mEq Oral Once  . traZODone  75 mg Oral QHS   Continuous Infusions:  PRN Meds: acetaminophen **OR** acetaminophen, promethazine, senna-docusate   Vital Signs    Vitals:   11/25/19 1425 11/25/19 1542 11/25/19 2034 11/26/19 0456  BP: (!) 121/52  (!) 105/44 (!) 135/54  Pulse: 61  67 99  Resp:   18 20  Temp: 98.2 F (36.8 C)  97.7 F (36.5 C) 98.7 F (37.1 C)  TempSrc: Oral  Oral Oral  SpO2: 98% 94% 97% 90%  Weight:    59.5 kg  Height:        Intake/Output Summary (Last 24 hours) at 11/26/2019 0929 Last data filed at 11/26/2019 0459 Gross per 24 hour  Intake 870 ml  Output 2075 ml  Net -1205 ml   Last 3 Weights 11/26/2019 11/25/2019 11/24/2019  Weight (lbs) 131 lb 2.8 oz 132 lb 131 lb 2.8 oz  Weight (kg) 59.5 kg 59.875 kg 59.5 kg      Telemetry    afib 100-120s- Personally Reviewed  ECG    No new tracing   Physical Exam   GEN: No acute distress.   Neck: No JVD Cardiac: RRR, no murmurs, rubs, or gallops.  Respiratory: faint R base crackles  GI: Soft, nontender, non-distended  MS: No edema; No deformity. Neuro:  Nonfocal  Psych: Normal affect   Labs    High Sensitivity Troponin:   Recent Labs  Lab 11/22/19 1122  11/22/19 1421 11/22/19 1547 11/24/19 1819  TROPONINIHS 16 12 10 10       Chemistry Recent Labs  Lab 11/24/19 2142 11/25/19 0825 11/26/19 0507  NA 134* 136 135  K 4.9 3.9 3.2*  CL 102 101 98  CO2 20* 23 25  GLUCOSE 110* 133* 102*  BUN 23 23 27*  CREATININE 1.62* 1.57* 1.57*  CALCIUM 9.0 9.2 9.3  PROT 7.1  --   --   ALBUMIN 3.0*  --   --   AST 31  --   --   ALT 16  --   --   ALKPHOS 79  --   --   BILITOT 1.1  --   --   GFRNONAA 30* 31* 31*  GFRAA 34* 36* 36*  ANIONGAP 12 12 12      Hematology Recent Labs  Lab 11/23/19 0445 11/25/19 0444 11/26/19 0507  WBC 8.5 11.2* 11.3*  RBC 4.13 3.97 4.12  HGB 9.1* 8.9* 9.1*  HCT 31.4* 30.4* 31.0*  MCV 76.0* 76.6* 75.2*  MCH 22.0* 22.4* 22.1*  MCHC 29.0* 29.3* 29.4*  RDW 17.4* 17.1* 17.3*  PLT 339 303  305    BNP Recent Labs  Lab 11/22/19 1122  BNP 1,012.5*     Radiology    DG Chest 2 View  Result Date: 11/25/2019 CLINICAL DATA:  Dyspnea. EXAM: CHEST - 2 VIEW COMPARISON:  Radiographs dated 11/22/2019 and 11/24/2019 and 08/26/2018 and 11/03/2017 and CT scan of the abdomen dated 12/11/2018 FINDINGS: There is progression of the infiltrate in the right upper lobe adjacent to the minor fissure since 11/24/2019. The diffuse slight interstitial accentuation throughout the left lung is stable. Stable mild cardiomegaly. Aortic atherosclerosis. Large chronic hiatal hernia. No acute bone abnormality. Multiple thoracic compression fractures, 2 of which have been treated with vertebroplasty. Chronic accentuation of the thoracic kyphosis. IMPRESSION: 1. Progressive infiltrate in the right upper lobe adjacent to the minor fissure. 2. Stable diffuse interstitial accentuation throughout the left lung. Electronically Signed   By: Lorriane Shire M.D.   On: 11/25/2019 13:33   DG Chest Portable 1 View  Result Date: 11/24/2019 CLINICAL DATA:  Acute onset fatigue and right arm pain. Short of breath EXAM: PORTABLE CHEST 1 VIEW COMPARISON:   11/22/2019 FINDINGS: Cardiac enlargement without significant vascular congestion. Atherosclerotic aorta Progression of right upper lobe patchy airspace disease. Progression of mild left lower lobe airspace disease. Small right effusion. IMPRESSION: Progression of mild bilateral airspace disease, possible pneumonia Electronically Signed   By: Franchot Gallo M.D.   On: 11/24/2019 18:32    Cardiac Studies   TTE: 11/22/19  IMPRESSIONS    1. Left ventricular ejection fraction, by estimation, is 35 to 40%. The  left ventricle has moderately decreased function. The left ventricle  demonstrates global hypokinesis. Left ventricular diastolic function could  not be evaluated.  2. Right ventricular systolic function is moderately reduced. The right  ventricular size is normal. There is mildly elevated pulmonary artery  systolic pressure. The estimated right ventricular systolic pressure is  [id number] mmHg.  3. Left atrial size was severely dilated.  4. Right atrial size was severely dilated.  5. The mitral valve is normal in structure and function. Mild to moderate  mitral valve regurgitation.  6. The aortic valve is tricuspid. Aortic valve regurgitation is moderate.  Mild to moderate aortic valve sclerosis/calcification is present, without  any evidence of aortic stenosis.  7. The inferior vena cava is normal in size with <50% respiratory  variability, suggesting right atrial pressure of 8 mmHg.   Patient Profile     79 y.o. 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 Fibs/pmultiple cardioversionswho was seen for the evaluation of decreased EF and A. fibat the request of Dr. Evette Doffing.  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 Follow-up EF 2019 showed EF60-65% so no ischemic work-up was pursued. - Echo this admission EF 35-40% with global hypokinesis, moderately reduced RV function with RV systolic pressure 35 mmHg, mild tomod MR - HS troponin trend flat and not consistent with ACS.Suspect decompensation was related to Afib RVR. - has been on Toprol since admission which was DC 2nd to bradycardia now restarted again with recurrent afib RVR  2.Acute on chronic systolic and diastolic HF:EF in 123456. EF this admission lower 35-40%.BNP elevated to over 1000. CXR with pulmonary edema. Net I & O negative 3.6L. Continue IV lasix. Renal function stable.   3.Symptomatic persistent Afib:On  Eliquis 5 mg BID prior to admission for stroke prophylaxis. Amiodarone was increased 100mg  BID and Toprol 12.5mg  added. -- had successful DCCV >> then became symptomatic bradycardiac in mid 50s>>stopped Toprol >> now again went into afib RVR and she is symptomatic. Restarted BB. Will ask EP to see.  -- Eliquis now dose reduced to 2.5mg  BID  4.AKI - SCr stable today - baseline around 1.2  5.Mild to moderate MR - per echo  6.H/o of stroke - on Eliquis  7. Hypokalemia - Supplimented   For questions or updates, please contact [place] Please consult [email or web address] for contact info under        Signed, Leanor Kail, PA  11/26/2019, 9:29 AM    I have personally seen and examined this patient. I agree with the assessment and plan as outlined above. She continues to have dyspnea but improved. Lung exam with continued basilar crackles. I would continue IV Lasix today. She is back in atrial fib today. Will ask EP to see her. She is followed by Dr. Rayann Heman. She has done well with amiodarone prior to this admission.   Lauree Chandler 11/26/2019 9:49 AM

## 2019-11-26 NOTE — Progress Notes (Signed)
Subjective:   Ms. Goellner was seen sleeping in her bed this morning when the team entered the room. She states that she has been feeling tired. She kept waking up last night. Mentioned that she was confused and wasn't clear about whether it was day or nighttime.   She felt that she had a chill overnight, but denies fevers or sweating. Denies chest pain. She states that she feels her breathing has improved.   Team opened blinds, changed her calendar date, and showed her the clock so she can check the time.   Objective:    Vital Signs (last 24 hours): Vitals:   11/25/19 1425 11/25/19 1542 11/25/19 2034 11/26/19 0456  BP: (!) 121/52  (!) 105/44 (!) 135/54  Pulse: 61  67 99  Resp:   18 20  Temp: 98.2 F (36.8 C)  97.7 F (36.5 C) 98.7 F (37.1 C)  TempSrc: Oral  Oral Oral  SpO2: 98% 94% 97% 90%  Weight:      Height:        Physical Exam: General Alert and answers questions appropriately, no acute distress  Cardiac Regular rate and rhythm, no murmurs, rubs, or gallops. JVP mildly elevated  Pulmonary Clear to auscultation bilaterally without wheezes, rhonchi, or rales  Extremities No peripheral edema    CBC Latest Ref Rng & Units 11/26/2019 11/25/2019 11/23/2019  WBC 4.0 - 10.5 K/uL 11.3(H) 11.2(H) 8.5  Hemoglobin 12.0 - 15.0 g/dL 9.1(L) 8.9(L) 9.1(L)  Hematocrit 36.0 - 46.0 % 31.0(L) 30.4(L) 31.4(L)  Platelets 150 - 400 K/uL 305 303 339   BMP Latest Ref Rng & Units 11/26/2019 11/25/2019 11/24/2019  Glucose 70 - 99 mg/dL 102(H) 133(H) 110(H)  BUN 8 - 23 mg/dL 27(H) 23 23  Creatinine 0.44 - 1.00 mg/dL 1.57(H) 1.57(H) 1.62(H)  BUN/Creat Ratio 12 - 28 - - -  Sodium 135 - 145 mmol/L 135 136 134(L)  Potassium 3.5 - 5.1 mmol/L 3.2(L) 3.9 4.9  Chloride 98 - 111 mmol/L 98 101 102  CO2 22 - 32 mmol/L 25 23 20(L)  Calcium 8.9 - 10.3 mg/dL 9.3 9.2 9.0   Output: 2425 ml (Net 1195 ml)  Assessment/Plan:   Principal Problem:   Acute HFrEF (heart failure with reduced ejection fraction)  (HCC) Active Problems:   Atrial fibrillation with RVR (HCC)   Chest tightness   Acute on chronic HFrEF (heart failure with reduced ejection fraction) (HCC)   Atrial fibrillation (HCC)   CKD (chronic kidney disease) stage 3, GFR 30-59 ml/min  Patient is an 79 year old female with past medical history significant for Barrett's esophagus, atrial fibrillation, combined systolic and diastolic heart failure, hypertension, hypothyroidism, stroke (2019) who presented to the ER on 11/22/2019 with shortness of breath and chest pain and admitted for acute heart failure likely secondary to atrial fibrillation.  # Acute HFrEF 2/2 Atrial Fibrillation: TTE on 11/22/2019 demonstrated reduced EF 35-40%.  Acute heart failure likely secondary to atrial fibrillation with RVR.  Following cardioversion on 11/24/2019, patient was bradycardic and had decline in respiratory status.  Patient was given IV Lasix 40 mg at that time with slight improvement. Two-view chest x-ray was conducted which revealed a progressive opacity in the right upper lobe.  Differential remains broad to include pneumonia, infarct, mass.  White blood cell count remains with mild, stable elevation, but no fever or cough to suggest pneumonia. Patient on apixaban making PE less likely but will order V/Q scan to assess. Patient had successful cardioversion performed on 2/16 but at ~10pm last  night patient returned to A. Fib with RVR * V/Q scan to assess for PE * Continue amiodarone 100 mg twice daily * Restart metoprolol 12.5 mg twice daily - initially held for bradycardia following cardioversion * Continue IV lasix 40 mg twice daily * Potassium 3.2 today, repleting * Continue Eliquis 2.5 mg twice daily * Continue telemetry  * Strict I/Os, Daily weights, monitor electrolytes  # AKI on CKD stage 3: Likely related to vascular congestion from acute decompensated heart failure, continue diuresis as above  # Microcytic anemia: Likely iron deficiency anemia  with low iron and low ferritin. * Will give IV Feraheme x1 today  PT/OT: PT/OT recommend 24-hour supervision. No followup/equipment needed Diet: Heart DVT Ppx: Eliquis 2.5 mg twice daily Dispo: Anticipated discharge in approximately 2-3 days  Jeanmarie Hubert, MD 11/26/2019, 6:18 AM

## 2019-11-26 NOTE — Progress Notes (Signed)
Patient refused CPAP for the night  

## 2019-11-26 NOTE — Plan of Care (Signed)
°  Problem: Education: °Goal: Ability to demonstrate management of disease process will improve °Outcome: Progressing °Goal: Ability to verbalize understanding of medication therapies will improve °Outcome: Progressing °Goal: Individualized Educational Video(s) °Outcome: Progressing °  °

## 2019-11-27 ENCOUNTER — Encounter: Payer: Self-pay | Admitting: *Deleted

## 2019-11-27 ENCOUNTER — Inpatient Hospital Stay (HOSPITAL_COMMUNITY): Payer: PPO

## 2019-11-27 DIAGNOSIS — L8990 Pressure ulcer of unspecified site, unspecified stage: Secondary | ICD-10-CM | POA: Insufficient documentation

## 2019-11-27 LAB — BASIC METABOLIC PANEL
Anion gap: 14 (ref 5–15)
BUN: 24 mg/dL — ABNORMAL HIGH (ref 8–23)
CO2: 27 mmol/L (ref 22–32)
Calcium: 9.5 mg/dL (ref 8.9–10.3)
Chloride: 96 mmol/L — ABNORMAL LOW (ref 98–111)
Creatinine, Ser: 1.22 mg/dL — ABNORMAL HIGH (ref 0.44–1.00)
GFR calc Af Amer: 48 mL/min — ABNORMAL LOW (ref >60)
GFR calc non Af Amer: 42 mL/min — ABNORMAL LOW (ref >60)
Glucose, Bld: 80 mg/dL (ref 70–99)
Potassium: 3.8 mmol/L (ref 3.5–5.1)
Sodium: 137 mmol/L (ref 135–145)

## 2019-11-27 LAB — CBC
HCT: 34.2 % — ABNORMAL LOW (ref 36.0–46.0)
Hemoglobin: 10.1 g/dL — ABNORMAL LOW (ref 12.0–15.0)
MCH: 22.1 pg — ABNORMAL LOW (ref 26.0–34.0)
MCHC: 29.5 g/dL — ABNORMAL LOW (ref 30.0–36.0)
MCV: 75 fL — ABNORMAL LOW (ref 80.0–100.0)
Platelets: 320 10*3/uL (ref 150–400)
RBC: 4.56 MIL/uL (ref 3.87–5.11)
RDW: 17.5 % — ABNORMAL HIGH (ref 11.5–15.5)
WBC: 13 10*3/uL — ABNORMAL HIGH (ref 4.0–10.5)
nRBC: 0 % (ref 0.0–0.2)

## 2019-11-27 LAB — MAGNESIUM: Magnesium: 1.5 mg/dL — ABNORMAL LOW (ref 1.7–2.4)

## 2019-11-27 LAB — PROTIME-INR
INR: 1.7 — ABNORMAL HIGH (ref 0.8–1.2)
Prothrombin Time: 20.2 seconds — ABNORMAL HIGH (ref 11.4–15.2)

## 2019-11-27 MED ORDER — HYDROCORTISONE 1 % EX CREA
1.0000 "application " | TOPICAL_CREAM | Freq: Three times a day (TID) | CUTANEOUS | Status: DC | PRN
  Filled 2019-11-27: qty 28

## 2019-11-27 MED ORDER — IOHEXOL 350 MG/ML SOLN
80.0000 mL | Freq: Once | INTRAVENOUS | Status: AC | PRN
  Administered 2019-11-27: 80 mL via INTRAVENOUS

## 2019-11-27 MED ORDER — FUROSEMIDE 40 MG PO TABS
40.0000 mg | ORAL_TABLET | Freq: Two times a day (BID) | ORAL | Status: DC
  Administered 2019-11-28: 40 mg via ORAL
  Filled 2019-11-27: qty 1

## 2019-11-27 MED ORDER — FUROSEMIDE 10 MG/ML IJ SOLN: 40.0000 mg | Freq: Two times a day (BID) | INTRAMUSCULAR | Status: DC

## 2019-11-27 MED ORDER — MAGNESIUM SULFATE 4 GM/100ML IV SOLN
4.0000 g | Freq: Once | INTRAVENOUS | Status: AC
  Administered 2019-11-27: 4 g via INTRAVENOUS
  Filled 2019-11-27: qty 100

## 2019-11-27 MED ORDER — POTASSIUM CHLORIDE CRYS ER 20 MEQ PO TBCR
40.0000 meq | EXTENDED_RELEASE_TABLET | Freq: Once | ORAL | Status: AC
  Administered 2019-11-27: 40 meq via ORAL
  Filled 2019-11-27: qty 2

## 2019-11-27 MED ORDER — METOPROLOL TARTRATE 25 MG PO TABS
25.0000 mg | ORAL_TABLET | Freq: Four times a day (QID) | ORAL | Status: DC
  Administered 2019-11-27 – 2019-12-01 (×16): 25 mg via ORAL
  Filled 2019-11-27 (×17): qty 1

## 2019-11-27 NOTE — Progress Notes (Addendum)
Progress Note  Patient Name: Teresa York Date of Encounter: 11/27/2019  Primary Cardiologist: Lauree Chandler, MD   Subjective   Reports improved breathing.  Remains fatigue and tired with A. fib RVR.  Inpatient Medications    Scheduled Meds: . amiodarone  400 mg Oral Daily  . apixaban  2.5 mg Oral BID  . cholecalciferol  1,000 Units Oral Daily  . feeding supplement (ENSURE ENLIVE)  237 mL Oral QHS  . feeding supplement (PRO-STAT SUGAR FREE 64)  30 mL Oral BID  . FLUoxetine  40 mg Oral Daily  . furosemide  40 mg Intravenous Q12H  . latanoprost  1 drop Both Eyes QHS  . levothyroxine  175 mcg Oral Q0600  . metoprolol tartrate  12.5 mg Oral BID  . multivitamin with minerals  1 tablet Oral Daily  . pantoprazole  80 mg Oral Daily  . potassium chloride  40 mEq Oral Once  . traZODone  75 mg Oral QHS   Continuous Infusions: . magnesium sulfate bolus IVPB     PRN Meds: acetaminophen **OR** acetaminophen, promethazine, senna-docusate   Vital Signs    Vitals:   11/26/19 1142 11/26/19 2016 11/27/19 0100 11/27/19 0405  BP: 121/65 (!) 125/59  120/65  Pulse: (!) 105 98  (!) 105  Resp: 17 18  19   Temp: 98 F (36.7 C) 98.9 F (37.2 C)  97.9 F (36.6 C)  TempSrc: Oral Oral  Oral  SpO2: 95% 92%  92%  Weight:   56.9 kg   Height:        Intake/Output Summary (Last 24 hours) at 11/27/2019 0847 Last data filed at 11/27/2019 0838 Gross per 24 hour  Intake 1260 ml  Output 3100 ml  Net -1840 ml   Last 3 Weights 11/27/2019 11/26/2019 11/25/2019  Weight (lbs) 125 lb 7.1 oz 131 lb 2.8 oz 132 lb  Weight (kg) 56.9 kg 59.5 kg 59.875 kg      Telemetry    Atrial fibrillation at rate of 110 to 120 bpm- Personally Reviewed  ECG    No new tracing  Physical Exam   GEN:  Thin frail elderly female Neck: No JVD Cardiac: RRR, no murmurs, rubs, or gallops.  Respiratory:  Faint bibasilar crackles GI: Soft, nontender, non-distended  MS: No edema; No deformity. Neuro:  Nonfocal    Psych: Normal affect   Labs    High Sensitivity Troponin:   Recent Labs  Lab 11/22/19 1122 11/22/19 1421 11/22/19 1547 11/24/19 1819  TROPONINIHS 16 12 10 10       Chemistry Recent Labs  Lab 11/24/19 2142 11/24/19 2142 11/25/19 0825 11/26/19 0507 11/27/19 0455  NA 134*   < > 136 135 137  K 4.9   < > 3.9 3.2* 3.8  CL 102   < > 101 98 96*  CO2 20*   < > 23 25 27   GLUCOSE 110*   < > 133* 102* 80  BUN 23   < > 23 27* 24*  CREATININE 1.62*   < > 1.57* 1.57* 1.22*  CALCIUM 9.0   < > 9.2 9.3 9.5  PROT 7.1  --   --   --   --   ALBUMIN 3.0*  --   --   --   --   AST 31  --   --   --   --   ALT 16  --   --   --   --   ALKPHOS 79  --   --   --   --  BILITOT 1.1  --   --   --   --   GFRNONAA 30*   < > 31* 31* 42*  GFRAA 34*   < > 36* 36* 48*  ANIONGAP 12   < > 12 12 14    < > = values in this interval not displayed.     Hematology Recent Labs  Lab 11/25/19 0444 11/26/19 0507 11/27/19 0455  WBC 11.2* 11.3* 13.0*  RBC 3.97 4.12 4.56  HGB 8.9* 9.1* 10.1*  HCT 30.4* 31.0* 34.2*  MCV 76.6* 75.2* 75.0*  MCH 22.4* 22.1* 22.1*  MCHC 29.3* 29.4* 29.5*  RDW 17.1* 17.3* 17.5*  PLT 303 305 320    BNP Recent Labs  Lab 11/22/19 1122  BNP 1,012.5*     DDimer No results for input(s): DDIMER in the last 168 hours.   Radiology    DG Chest 2 View  Result Date: 11/25/2019 CLINICAL DATA:  Dyspnea. EXAM: CHEST - 2 VIEW COMPARISON:  Radiographs dated 11/22/2019 and 11/24/2019 and 08/26/2018 and 11/03/2017 and CT scan of the abdomen dated 12/11/2018 FINDINGS: There is progression of the infiltrate in the right upper lobe adjacent to the minor fissure since 11/24/2019. The diffuse slight interstitial accentuation throughout the left lung is stable. Stable mild cardiomegaly. Aortic atherosclerosis. Large chronic hiatal hernia. No acute bone abnormality. Multiple thoracic compression fractures, 2 of which have been treated with vertebroplasty. Chronic accentuation of the thoracic  kyphosis. IMPRESSION: 1. Progressive infiltrate in the right upper lobe adjacent to the minor fissure. 2. Stable diffuse interstitial accentuation throughout the left lung. Electronically Signed   By: Lorriane Shire M.D.   On: 11/25/2019 13:33   NM Pulmonary Perfusion  Result Date: 11/26/2019 CLINICAL DATA:  Shortness of breath EXAM: NUCLEAR MEDICINE PERFUSION LUNG SCAN TECHNIQUE: Perfusion images were obtained in multiple projections after intravenous injection of radiopharmaceutical. Patient refused ventilation study. Views: Anterior, posterior, left lateral, right lateral, RPO, LPO, RAO, LAO RADIOPHARMACEUTICALS:  1.55  mCi Tc-40m MAA IV COMPARISON:  Chest radiograph November 25, 2019 FINDINGS: There is photopenia in the anterior segment of the right upper lobe, which corresponds to an area infiltrate seen on chest radiograph. Elsewhere radiotracer uptake is within normal limits on the perfusion study. There is cardiomegaly. IMPRESSION: There is photopenia in the anterior segment right upper lobe with airspace consolidation/pneumonia in this area seen on chest radiograph. Patient would not permit performance of ventilation study for further assessment of this area. This photopenia may well be due to the consolidation in the right upper lobe. This study, however, must be viewed overall as indeterminate with respect to potential pulmonary embolus given the right upper lobe defect without ventilation study to compare. If patient agrees to ventilation study, ventilation study could be helpful for further assessment in this regard. Correlation with CT angiography of the chest may well be the optimum study of choice in this circumstance, unless there is a contraindication to iodinated contrast material administration. If there is contraindication to iodinated contrast material administration, lower extremity venous duplex ultrasound to assess for possible lower extremity deep venous thrombosis could be helpful in  this circumstance as well. Electronically Signed   By: Lowella Grip III M.D.   On: 11/26/2019 14:21    Cardiac Studies   TTE: 11/22/19  IMPRESSIONS    1. Left ventricular ejection fraction, by estimation, is 35 to 40%. The  left ventricle has moderately decreased function. The left ventricle  demonstrates global hypokinesis. Left ventricular diastolic function could  not be  evaluated.  2. Right ventricular systolic function is moderately reduced. The right  ventricular size is normal. There is mildly elevated pulmonary artery  systolic pressure. The estimated right ventricular systolic pressure is  [id number] mmHg.  3. Left atrial size was severely dilated.  4. Right atrial size was severely dilated.  5. The mitral valve is normal in structure and function. Mild to moderate  mitral valve regurgitation.  6. The aortic valve is tricuspid. Aortic valve regurgitation is moderate.  Mild to moderate aortic valve sclerosis/calcification is present, without  any evidence of aortic stenosis.  7. The inferior vena cava is normal in size with <50% respiratory  variability, suggesting right atrial pressure of 8 mmHg.   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 Fibs/pmultiple cardioversionswhowas seenfor the evaluation of decreased EF and A. fibat the request of Dr. Evette Doffing.  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 Follow-up EF 2019 showed EF60-65% so no ischemic work-up was pursued. - Echo this admission EF 35-40% with global hypokinesis, moderately reduced RV function with RV systolic pressure 35 mmHg, mild  tomod MR - HS troponin trend flat and not consistent with ACS.Suspect decompensationwasrelated to Afib RVR. - has been on Toprol since admission which was DC 2nd to bradycardia now restarted beta-blocker with metoprolol 12.5 mg twice daily for  recurrent afib RVR>>> watch heart rate once in sinus rhythm  2.Acute on chronic systolic and diastolic HF:EF in 123456. EF this admission lower 35-40%.BNP elevated to over 1000. CXR with pulmonary edema. Net I & O negative 5.8L.  Weight loss 10 pounds since admit.   -Change IV Lasix to p.o. later today or tomorrow. -Renal function back almost normal  3.Symptomatic persistent Afib:On Eliquis 5 mg BIDprior to admissionfor stroke prophylaxis. Amiodarone was increased 100mg  BID and Toprol 12.5mg  added. -- had successful DCCV >> then became symptomatic bradycardiac in mid 50s>>stopped Toprol >> now again went into afib RVR and she is symptomatic. Restarted BB. -Seen by EP yesterday and increase amiodarone to 400 mg daily.  Plan to do cardioversion on this dose, likely next week.  -- Eliquis now dose reduced to 2.5mg  BID  4.AKI - SCr  Improved 1.62>1.57>>1.57>>1.22 - baseline around 1.2  5.Mild to moderate MR - per echo  6.H/o of stroke -on Eliquis  7. Hypokalemia - Supplemented -Resolved  8.  Hypomagnesemia -Supplemented  For questions or updates, please contact [place] Please consult [email or web address] for contact info under        Signed, Leanor Kail, PA  11/27/2019, 8:47 AM    I have personally seen and examined this patient. I agree with the assessment and plan as outlined above.  She remains weak. She is back in atrial fibrillation over the past 36 hours. She had been cardioverted earlier this week. She had sinus brady following cardioversion and did not tolerate beta blockers well. We had increased her amiodarone prior to her cardioversion this week. She was seen by EP yesterday for recommendations  given her PAF, LV systolic dysfunction that is felt to be driven by her PAF and also weakness when in atrial fib. EP has recommended amiodarone load over the next few days and repeat cardioversion next week.   Volume status is much better. Still with  crackles on lung exam which may not be related to fluid. Would convert to po Lasix.   Lauree Chandler 11/27/2019 9:33 AM

## 2019-11-27 NOTE — Progress Notes (Signed)
Occupational Therapy Treatment Patient Details Name: Teresa York MRN: [id number] DOB: 1941/09/05 Today's Date: 11/27/2019    History of present illness Pt is a 79 y.o. F with significant PMH of stroke, afib, CHF, who presents with SOB, elevated HR, and hypokalemia. Received IV lasix in ED. S/P cardioversion 2/16.    OT comments  Patient supine in bed and agreeable to OT session.  Patient requires increased time and effort for all activities, min assist for transfers using RW, toileting with max assist, and min assist for LB dressing.  Patient became light headed and significantly fatigued after +BM and required +2 min assist for safety to return to recliner in reclined position (as patient reports feeling like she might pass out).  Noted destat on 2L to 86% and required 4L to return to 90%.  HR ranged 100-130s during session.  BP stable in reclined position, O2 on 2L upon exit with SpO2 90%.  RN notified of events of session.  Updated dc plan to [facility] services in order to optimize independence with ADLs and tolerance to activity.     Follow Up Recommendations  Home health OT;Supervision/Assistance - 24 hour    Equipment Recommendations  3 in 1 bedside commode    Recommendations for Other Services Other (comment)(palliative care- for goals of care )    Precautions / Restrictions Precautions Precautions: Fall Precaution Comments: watch HR and O2 Restrictions Weight Bearing Restrictions: No       Mobility Bed Mobility Overal bed mobility: Needs Assistance Bed Mobility: Supine to Sit     Supine to sit: Min assist     General bed mobility comments: for trunk elevation due to weakness  Transfers Overall transfer level: Needs assistance Equipment used: Rolling walker (2 wheeled) Transfers: Sit to/from [facility] Sit to Stand: Min assist;+2 safety/equipment Stand pivot transfers: Min assist;+2 physical assistance;+2 safety/equipment       General transfer  comment: min assist from EOB to power up and steady, min assist +2 safety from commode and chair to safely pivot to recliner     Balance Overall balance assessment: Needs assistance Sitting-balance support: No upper extremity supported;Feet supported Sitting balance-Leahy Scale: Good     Standing balance support: Bilateral upper extremity supported;During functional activity Standing balance-Leahy Scale: Poor Standing balance comment: relaint on BUE and external support                           ADL either performed or assessed with clinical judgement   ADL Overall ADL's : Needs assistance/impaired                     Lower Body Dressing: Minimal assistance;Sit to/from stand Lower Body Dressing Details (indicate cue type and reason): sit to stand min assist, donning shoes at EOB Toilet Transfer: Minimal assistance;Ambulation;RW;Grab bars Toilet Transfer Details (indicate cue type and reason): cueing for hand placement and safety Toileting- Clothing Manipulation and Hygiene: Maximal assistance;Sit to/from stand Toileting - Clothing Manipulation Details (indicate cue type and reason): for hygiene and clothing mgmt, +BM on commode and RN notified       Functional mobility during ADLs: Minimal assistance;Rolling walker       Vision       Perception     Praxis      Cognition Arousal/Alertness: Awake/alert Behavior During Therapy: WFL for tasks assessed/performed Overall Cognitive Status: Within Functional Limits for tasks assessed  Exercises     Shoulder Instructions       General Comments HR ranged from 100-130s with limited activity, increased O2 to 4L (from 2L) to maintained 90% SpO2 (dropped to 86%); reclined back position in recliner BP 125/73 (87) after mobilizing due to feeling lightheaded; patient reclined in recliner on 2L with Spo2 90% at completion of sesion--RN notified      Pertinent Vitals/ Pain       Pain Assessment: Faces Faces Pain Scale: Hurts little more Pain Location: back  Pain Descriptors / Indicators: Discomfort Pain Intervention(s): Monitored during session;Repositioned  Home Living                                          Prior Functioning/Environment              Frequency  Min 2X/week        Progress Toward Goals  OT Goals(current goals can now be found in the care plan section)  Progress towards OT goals: Progressing toward goals  Acute Rehab OT Goals Patient Stated Goal: to feel better OT Goal Formulation: With patient  Plan Frequency remains appropriate;Discharge plan needs to be updated    Co-evaluation    PT/OT/SLP Co-Evaluation/Treatment: Yes Reason for Co-Treatment: Complexity of the patient's impairments (multi-system involvement);For patient/therapist safety PT goals addressed during session: Mobility/safety with mobility OT goals addressed during session: ADL's and self-care      AM-PAC OT "6 Clicks" Daily Activity     Outcome Measure   Help from another person eating meals?: None Help from another person taking care of personal grooming?: A Little Help from another person toileting, which includes using toliet, bedpan, or urinal?: A Little Help from another person bathing (including washing, rinsing, drying)?: A Little Help from another person to put on and taking off regular upper body clothing?: A Little Help from another person to put on and taking off regular lower body clothing?: A Little 6 Click Score: 19    End of Session Equipment Utilized During Treatment: Oxygen;Rolling walker  OT Visit Diagnosis: Other abnormalities of gait and mobility (R26.89);Muscle weakness (generalized) (M62.81)   Activity Tolerance Patient limited by fatigue   Patient Left with call bell/phone within reach;in chair;with chair alarm set   Nurse Communication Mobility status;Other  (comment)(position, O2 during session)        Time: [id number] OT Time Calculation (min): 48 min  Charges: OT General Charges $OT Visit: 1 Visit OT Treatments $Self Care/Home Management : 23-37 mins  [place] Pager [phone] Office [phone]    Delight Stare 11/27/2019, 1:00 PM

## 2019-11-27 NOTE — Progress Notes (Signed)
Subjective:   Patient feels tired today but breathing comfortably on 2 L O2.  She denies SOB, racing heart, chest pain.  She does not wish to do the ventilation study today-- during the perfusion study yesterday, she became panicked when she had to lie flat.  She is open to trying a CT today.    Objective:    Vital Signs (last 24 hours): Vitals:   11/26/19 1142 11/26/19 2016 11/27/19 0100 11/27/19 0405  BP: 121/65 (!) 125/59  120/65  Pulse: (!) 105 98  (!) 105  Resp: 17 18  19   Temp: 98 F (36.7 C) 98.9 F (37.2 C)  97.9 F (36.6 C)  TempSrc: Oral Oral  Oral  SpO2: 95% 92%  92%  Weight:   56.9 kg   Height:        Physical Exam: General Alert and answers questions appropriately, no acute distress  Cardiac Regular rate and rhythm, no murmurs, rubs, or gallops. JVP mildly elevated  Pulmonary Good air movement.  Crackles present in bilateral bases.  No wheezes, rhonchi, or rales  Extremities No peripheral edema    CBC Latest Ref Rng & Units 11/27/2019 11/26/2019 11/25/2019  WBC 4.0 - 10.5 K/uL 13.0(H) 11.3(H) 11.2(H)  Hemoglobin 12.0 - 15.0 g/dL 10.1(L) 9.1(L) 8.9(L)  Hematocrit 36.0 - 46.0 % 34.2(L) 31.0(L) 30.4(L)  Platelets 150 - 400 K/uL 320 305 303   BMP Latest Ref Rng & Units 11/27/2019 11/26/2019 11/25/2019  Glucose 70 - 99 mg/dL 80 102(H) 133(H)  BUN 8 - 23 mg/dL 24(H) 27(H) 23  Creatinine 0.44 - 1.00 mg/dL 1.22(H) 1.57(H) 1.57(H)  BUN/Creat Ratio 12 - 28 - - -  Sodium 135 - 145 mmol/L 137 135 136  Potassium 3.5 - 5.1 mmol/L 3.8 3.2(L) 3.9  Chloride 98 - 111 mmol/L 96(L) 98 101  CO2 22 - 32 mmol/L 27 25 23   Calcium 8.9 - 10.3 mg/dL 9.5 9.3 9.2   Output: 3,100 ml (Net -2,200 ml)  Perfusion study 2/19: IMPRESSION: -  photopenia in the anterior segment right upper lobe with airspace consolidation/pneumonia in this area seen on chest radiograph.  -- may be due to consolidation in RUL -  Patient would not permit performance of ventilation study for further assessment  of this area.  -   indeterminate with respect to potential pulmonary embolus given the right upper lobe defect without ventilation study to compare.  RECOMMENDATION: f patient agrees to ventilation study, ventilation study could be helpful for further assessment in this regard. Correlation with CT angiography of the chest may well be the optimum study of choice in this circumstance, unless there is a contraindication to iodinated contrast material administration. If there is contraindication to iodinated contrast material administration, lower extremity venous duplex ultrasound to assess for possible lower extremity deep venous thrombosis could be helpful in this circumstance as well.  Assessment/Plan:   Principal Problem:   Acute HFrEF (heart failure with reduced ejection fraction) (HCC) Active Problems:   Atrial fibrillation with RVR (HCC)   Chest tightness   Acute on chronic HFrEF (heart failure with reduced ejection fraction) (HCC)   Atrial fibrillation (HCC)   CKD (chronic kidney disease) stage 3, GFR 30-59 ml/min  Patient is an 79 year old female with past medical history significant for Barrett's esophagus, atrial fibrillation, combined systolic and diastolic heart failure, hypertension, hypothyroidism, stroke (2019) who presented to the ER on 11/22/2019 with shortness of breath and chest pain and admitted for acute heart failure likely secondary to atrial fibrillation.  #  Acute HFrEF 2/2 Atrial Fibrillation: TTE on 11/22/2019 demonstrated reduced EF 35-40%.  Acute heart failure likely secondary to atrial fibrillation with RVR.  Following cardioversion on 11/24/2019, patient was bradycardic and had decline in respiratory status.  Patient was given IV Lasix 40 mg at that time with slight improvement. Two-view chest x-ray was conducted which revealed a progressive opacity in the right upper lobe.  Differential remains broad to include pneumonia, infarct, mass.  White blood cell count  increased today (11.2 >>11.3 >> 13.0) and CRP elevated (12.5), but procalcitonin wnl and no fever or cough to suggest pneumonia. Patient on apixaban making PE less likely, but cannot rule out as V/Q study incomplete.  Perfusion study showed decreased perfusion, but indeterminate because patient not will to do ventilation portion of the study.  She is open to trying a CTA.  Responding well to lasix, but will hold this afternoon to reduce stress on kidneys in setting of contrasted study.  Patient had successful cardioversion performed on 2/16 but at ~10pm 2/17 patient returned to A. Fib with RVR.     * CTA to assess for PE * Patient declined ventilation study * Hold afternoon dose of IV lasix * Cardiology following * Amiodarone increased to 400 mg once daily * Continue metoprolol 12.5 mg twice daily - initially held for bradycardia following cardioversion * Continue Eliquis 2.5 mg twice daily * Continue telemetry  * Strict I/Os, Daily weights, monitor electrolytes  # AKI on CKD stage 3: Cr stable (1.22 today).  Likely related to vascular congestion from acute decompensated heart failure.    #Electrolyte abnormalities:  Replete as needed.  Mg low today.  K+ wnl.   -  Replete Mg today  # Microcytic anemia: Likely iron deficiency anemia with low iron and low ferritin.  Gave IV Feraheme x1 on 2/18.    PT/OT: PT/OT recommend 24-hour supervision. No followup/equipment needed Diet: Heart DVT Ppx: Eliquis 2.5 mg twice daily Dispo: Anticipated discharge in approximately 2-3 days  Johny Blamer, Medical Student 11/27/2019, 7:22 AM

## 2019-11-27 NOTE — Progress Notes (Signed)
Physical Therapy Treatment Patient Details Name: Teresa York MRN: [id number] DOB: Aug 29, 1941 Today's Date: 11/27/2019    History of Present Illness Pt is a 79 y.o. F with significant PMH of stroke, afib, CHF, who presents with SOB, elevated HR, and hypokalemia. Received IV lasix in ED. S/P cardioversion 2/16.     PT Comments    Patient seen for mobility progression. Pt tolerated limited mobility after ambulating to bathroom with assistance from OT.  Upon leaving bathroom pt with increased weakness and feeling like she might pass out so pt returned to sitting in chair just outside bathroom door and then transferred to recliner after rest. SpO2 desat to 86% on 2L O2 via [place] and required up to 4L to maintain 90% while transferring. Pt back on 2L end of session with SpO2 90%. BP WNL while pt in reclined position. Given pt's mobility level and decreased activity tolerance recommend HHPT for further skilled PT services to maximize independence and safety with mobility.    Follow Up Recommendations  Supervision/Assistance - 24 hour;Home health PT     Equipment Recommendations  None recommended by PT    Recommendations for Other Services       Precautions / Restrictions Precautions Precautions: Fall Precaution Comments: watch HR Restrictions Weight Bearing Restrictions: No    Mobility  Bed Mobility               General bed mobility comments: pt in bathroom with OT upon arrival and in recliner end of session  Transfers Overall transfer level: Needs assistance Equipment used: Rolling walker (2 wheeled) Transfers: Sit to/from [facility] Sit to Stand: Min assist;+2 safety/equipment Stand pivot transfers: Min assist;+2 physical assistance;+2 safety/equipment       General transfer comment: assist to power up from commode and chair and then assist to steady and manage RW when pivoting from chair to recliner  Ambulation/Gait Ambulation/Gait assistance: Min  guard;Min assist Gait Distance (Feet): 8 Feet Assistive device: Rolling walker (2 wheeled) Gait Pattern/deviations: Step-through pattern;Decreased stride length;Trunk flexed Gait velocity: decreased   General Gait Details: pt stood from commode and reporting she could not stand much longer when ambulating out of bathroom so a chair was placed outside of bathroom door; pt requires assist to steady and for safety given pt feeling as though she would "pass out"; SpO2 desat to 86% on 2L O2 via [place] and with rest, increased O2, and PLB SpO2 up to 90% on 4L; HR in 120s while mobilizing, Afib     Stairs             Wheelchair Mobility    Modified Rankin (Stroke Patients Only)       Balance Overall balance assessment: Needs assistance Sitting-balance support: No upper extremity supported;Feet supported Sitting balance-Leahy Scale: Good     Standing balance support: During functional activity;Bilateral upper extremity supported Standing balance-Leahy Scale: Poor                              Cognition Arousal/Alertness: Awake/alert Behavior During Therapy: WFL for tasks assessed/performed Overall Cognitive Status: Within Functional Limits for tasks assessed                                        Exercises      General Comments General comments (skin integrity, edema, etc.): BP 125/73 (87) after mobilizing  and in a reclined position; Pt back on 2L O2 via [place] end of session with SpO2 90%      Pertinent Vitals/Pain Pain Assessment: Faces Faces Pain Scale: Hurts little more Pain Location: back  Pain Descriptors / Indicators: Discomfort Pain Intervention(s): Monitored during session;Repositioned    Home Living                      Prior Function            PT Goals (current goals can now be found in the care plan section) Acute Rehab PT Goals Patient Stated Goal: to feel better Progress towards PT goals: Not progressing toward goals -  comment    Frequency    Min 3X/week      PT Plan Discharge plan needs to be updated    Co-evaluation PT/OT/SLP Co-Evaluation/Treatment: Yes Reason for Co-Treatment: Complexity of the patient's impairments (multi-system involvement);For patient/therapist safety PT goals addressed during session: Mobility/safety with mobility        AM-PAC PT "6 Clicks" Mobility   Outcome Measure  Help needed turning from your back to your side while in a flat bed without using bedrails?: None Help needed moving from lying on your back to sitting on the side of a flat bed without using bedrails?: None Help needed moving to and from a bed to a chair (including a wheelchair)?: None Help needed standing up from a chair using your arms (e.g., wheelchair or bedside chair)?: None Help needed to walk in hospital room?: None Help needed climbing 3-5 steps with a railing? : A Little 6 Click Score: 23    End of Session Equipment Utilized During Treatment: Gait belt Activity Tolerance: Patient tolerated treatment well Patient left: with call bell/phone within reach;in chair;with chair alarm set Nurse Communication: Mobility status PT Visit Diagnosis: Unsteadiness on feet (R26.81);Muscle weakness (generalized) (M62.81);Difficulty in walking, not elsewhere classified (R26.2)     Time: [id number] PT Time Calculation (min) (ACUTE ONLY): 23 min  Charges:  $Gait Training: 8-22 mins                     Earney Navy, PTA Acute Rehabilitation Services Pager: [phone] Office: [phone]     Darliss Cheney 11/27/2019, 12:42 PM

## 2019-11-27 NOTE — Progress Notes (Signed)
Progress Note   Subjective   Pt reports worsening SOB overnight.  Denies CP.  Remains in Afib.  No new concerns  Inpatient Medications    Scheduled Meds: . amiodarone  400 mg Oral Daily  . apixaban  2.5 mg Oral BID  . cholecalciferol  1,000 Units Oral Daily  . feeding supplement (ENSURE ENLIVE)  237 mL Oral QHS  . feeding supplement (PRO-STAT SUGAR FREE 64)  30 mL Oral BID  . FLUoxetine  40 mg Oral Daily  . [START ON 11/28/2019] furosemide  40 mg Intravenous Q12H  . latanoprost  1 drop Both Eyes QHS  . levothyroxine  175 mcg Oral Q0600  . metoprolol tartrate  12.5 mg Oral BID  . multivitamin with minerals  1 tablet Oral Daily  . pantoprazole  80 mg Oral Daily  . traZODone  75 mg Oral QHS   Continuous Infusions: . magnesium sulfate bolus IVPB 4 g (11/27/19 1044)   PRN Meds: acetaminophen **OR** acetaminophen, promethazine, senna-docusate   Vital Signs    Vitals:   11/26/19 2016 11/27/19 0100 11/27/19 0405 11/27/19 1113  BP: (!) 125/59  120/65 107/60  Pulse: 98  (!) 105 94  Resp: 18  19 20   Temp: 98.9 F (37.2 C)  97.9 F (36.6 C) 97.9 F (36.6 C)  TempSrc: Oral  Oral Oral  SpO2: 92%  92% 95%  Weight:  56.9 kg    Height:        Intake/Output Summary (Last 24 hours) at 11/27/2019 1115 Last data filed at 11/27/2019 [id number] Gross per 24 hour  Intake 900 ml  Output 3100 ml  Net -2200 ml   Filed Weights   11/25/19 0425 11/26/19 0456 11/27/19 0100  Weight: 59.9 kg 59.5 kg 56.9 kg    Telemetry    afib with elevated V rates - Personally Reviewed  Physical Exam   GEN- The patient is elderly and frail appearing, alert  Head- normocephalic, atraumatic Eyes-  Sclera clear, conjunctiva pink Ears- hearing intact Oropharynx- clear Neck- supple, Lungs-  normal work of breathing Heart- tachycardic irregular rhythm  GI- soft  Extremities- no clubbing, cyanosis, + edema  MS- diffuse muscle atrophy Skin- no rash or lesion Psych- euthymic mood, full affect Neuro-  strength and sensation are intact   Labs    Chemistry Recent Labs  Lab 11/24/19 2142 11/24/19 2142 11/25/19 0825 11/26/19 0507 11/27/19 0455  NA 134*   < > 136 135 137  K 4.9   < > 3.9 3.2* 3.8  CL 102   < > 101 98 96*  CO2 20*   < > 23 25 27   GLUCOSE 110*   < > 133* 102* 80  BUN 23   < > 23 27* 24*  CREATININE 1.62*   < > 1.57* 1.57* 1.22*  CALCIUM 9.0   < > 9.2 9.3 9.5  PROT 7.1  --   --   --   --   ALBUMIN 3.0*  --   --   --   --   AST 31  --   --   --   --   ALT 16  --   --   --   --   ALKPHOS 79  --   --   --   --   BILITOT 1.1  --   --   --   --   GFRNONAA 30*   < > 31* 31* 42*  GFRAA 34*   < > 36*  36* 48*  ANIONGAP 12   < > 12 12 14    < > = values in this interval not displayed.     Hematology Recent Labs  Lab 11/25/19 0444 11/26/19 0507 11/27/19 0455  WBC 11.2* 11.3* 13.0*  RBC 3.97 4.12 4.56  HGB 8.9* 9.1* 10.1*  HCT 30.4* 31.0* 34.2*  MCV 76.6* 75.2* 75.0*  MCH 22.4* 22.1* 22.1*  MCHC 29.3* 29.4* 29.5*  RDW 17.1* 17.3* 17.5*  PLT 303 305 320      Assessment & Plan    1.  Persistent atrial fibrillation/ atypical atrial flutter RVR continues  Our options are limited.  She is not an ablation candidate.  We should avoid device implant if able. I agree with Dr Curt Bears that best option may be to increase amiodarone and then proceed with cardioversion on Monday. Continue amiodarone 400mg  daily in the interim. Increase metoprolol to 25mg  PO q6 hours and titrate over the weekend. Continue on eliquis given prior stroke  2. Acute CHF Appreciate general cardiology assistance Titrate metoprolol as above  EP to follow.  Dr Lovena Le to be available as needed this weekend.  Thompson Grayer MD, [facility] 11/27/2019 11:15 AM

## 2019-11-28 LAB — BASIC METABOLIC PANEL
Anion gap: 11 (ref 5–15)
BUN: 31 mg/dL — ABNORMAL HIGH (ref 8–23)
CO2: 25 mmol/L (ref 22–32)
Calcium: 9.2 mg/dL (ref 8.9–10.3)
Chloride: 95 mmol/L — ABNORMAL LOW (ref 98–111)
Creatinine, Ser: 1.3 mg/dL — ABNORMAL HIGH (ref 0.44–1.00)
GFR calc Af Amer: 45 mL/min — ABNORMAL LOW (ref >60)
GFR calc non Af Amer: 39 mL/min — ABNORMAL LOW (ref >60)
Glucose, Bld: 109 mg/dL — ABNORMAL HIGH (ref 70–99)
Potassium: 3.3 mmol/L — ABNORMAL LOW (ref 3.5–5.1)
Sodium: 131 mmol/L — ABNORMAL LOW (ref 135–145)

## 2019-11-28 LAB — CBC
HCT: 32.1 % — ABNORMAL LOW (ref 36.0–46.0)
Hemoglobin: 9.5 g/dL — ABNORMAL LOW (ref 12.0–15.0)
MCH: 22.2 pg — ABNORMAL LOW (ref 26.0–34.0)
MCHC: 29.6 g/dL — ABNORMAL LOW (ref 30.0–36.0)
MCV: 75.2 fL — ABNORMAL LOW (ref 80.0–100.0)
Platelets: 320 10*3/uL (ref 150–400)
RBC: 4.27 MIL/uL (ref 3.87–5.11)
RDW: 17.8 % — ABNORMAL HIGH (ref 11.5–15.5)
WBC: 12 10*3/uL — ABNORMAL HIGH (ref 4.0–10.5)
nRBC: 0 % (ref 0.0–0.2)

## 2019-11-28 LAB — MAGNESIUM: Magnesium: 2.1 mg/dL (ref 1.7–2.4)

## 2019-11-28 MED ORDER — POLYSACCHARIDE IRON COMPLEX 150 MG PO CAPS
150.0000 mg | ORAL_CAPSULE | Freq: Every day | ORAL | Status: DC
  Administered 2019-11-28 – 2019-12-04 (×7): 150 mg via ORAL
  Filled 2019-11-28 (×7): qty 1

## 2019-11-28 MED ORDER — POTASSIUM CHLORIDE CRYS ER 10 MEQ PO TBCR
10.0000 meq | EXTENDED_RELEASE_TABLET | Freq: Three times a day (TID) | ORAL | Status: DC
  Administered 2019-11-28 – 2019-12-01 (×10): 10 meq via ORAL
  Filled 2019-11-28 (×10): qty 1

## 2019-11-28 MED ORDER — BENZONATATE 100 MG PO CAPS
100.0000 mg | ORAL_CAPSULE | Freq: Three times a day (TID) | ORAL | Status: DC | PRN
  Administered 2019-11-28: 100 mg via ORAL
  Filled 2019-11-28: qty 1

## 2019-11-28 MED ORDER — FUROSEMIDE 40 MG PO TABS
40.0000 mg | ORAL_TABLET | Freq: Every day | ORAL | Status: DC
  Administered 2019-11-29: 40 mg via ORAL
  Filled 2019-11-28 (×2): qty 1

## 2019-11-28 NOTE — Progress Notes (Signed)
Subjective:   Ms. Bove was difficult to arouse from sleep this morning.  She mumbled one word answers that were difficult to understand at time, though denied SOB or CP.  When visited later in the morning, she was awake and oriented and her husband was at bedside.  Patient reports still feeling very low energy.      Objective:    Vital Signs (last 24 hours): Vitals:   11/27/19 1700 11/27/19 1947 11/28/19 0441 11/28/19 1505  BP:  104/63 (!) 106/58   Pulse:  99 (!) 107 (!) 101  Resp:  (!) 22 20   Temp:  98.4 F (36.9 C) 98 F (36.7 C)   TempSrc:      SpO2: 91% 94% 91%   Weight:   57.5 kg   Height:       94% on 4 L    Weight:  57.5 kg (126 lb 12.2 oz)   2/19:  56.9 kg On admission 61.6 kg  Physical Exam: General Alert and answers questions appropriately, no acute distress  Cardiac Regular rate and rhythm, no murmurs, rubs, or gallops. No JVD J  Pulmonary Good air movement.   No wheezes, rhonchi, or rales.  Breathing comfortably on 4 L O2.    Extremities No peripheral edema    CBC Latest Ref Rng & Units 11/28/2019 11/27/2019 11/26/2019  WBC 4.0 - 10.5 K/uL 12.0(H) 13.0(H) 11.3(H)  Hemoglobin 12.0 - 15.0 g/dL 9.5(L) 10.1(L) 9.1(L)  Hematocrit 36.0 - 46.0 % 32.1(L) 34.2(L) 31.0(L)  Platelets 150 - 400 K/uL 320 320 305   BMP Latest Ref Rng & Units 11/28/2019 11/27/2019 11/26/2019  Glucose 70 - 99 mg/dL 109(H) 80 102(H)  BUN 8 - 23 mg/dL 31(H) 24(H) 27(H)  Creatinine 0.44 - 1.00 mg/dL 1.30(H) 1.22(H) 1.57(H)  BUN/Creat Ratio 12 - 28 - - -  Sodium 135 - 145 mmol/L 131(L) 137 135  Potassium 3.5 - 5.1 mmol/L 3.3(L) 3.8 3.2(L)  Chloride 98 - 111 mmol/L 95(L) 96(L) 98  CO2 22 - 32 mmol/L 25 27 25   Calcium 8.9 - 10.3 mg/dL 9.2 9.5 9.3   Output: 900 (Net +300 ml)  CTA 2/19: IMPRESSION: No evidence of acute pulmonary embolism. Bilateral consolidative and ground-glass opacities. This may reflect edema or multifocal pneumonia. Small right pleural effusion. Cardiomegaly.    Assessment/Plan:   Principal Problem:   Acute HFrEF (heart failure with reduced ejection fraction) (HCC) Active Problems:   Atrial fibrillation with RVR (HCC)   Chest tightness   Acute on chronic HFrEF (heart failure with reduced ejection fraction) (HCC)   Atrial fibrillation (HCC)   CKD (chronic kidney disease) stage 3, GFR 30-59 ml/min   Pressure injury of skin  Patient is an 79 year old female with past medical history significant for Barrett's esophagus, atrial fibrillation, combined systolic and diastolic heart failure, hypertension, hypothyroidism, stroke (2019) who presented to the ER on 11/22/2019 with shortness of breath and chest pain and admitted for acute heart failure likely secondary to atrial fibrillation.  # Acute HFrEF 2/2 Atrial Fibrillation: TTE on 11/22/2019 demonstrated reduced EF 35-40%.  Acute heart failure likely secondary to atrial fibrillation with RVR.  Following cardioversion on 11/24/2019, patient was bradycardic and had decline in respiratory status.  Patient was given IV Lasix 40 mg at that time with slight improvement. Two-view chest x-ray was conducted which revealed a progressive opacity in the right upper lobe.  Differential remains broad to include pneumonia, infarct, mass. CTA negative for PE, but did have significant multifocal ground glass  opacities with no clear etiology.  Amiodarone toxicity is a consideration.  White blood cell count increasing (11.2 >>11.3 >> 13.0) and CRP elevated (12.5), but procalcitonin wnl and no fever to suggest pneumonia. Per Dr. Rayann Heman, concern for amyloidosis given renal insufficiency, conduction system disease with RBBB, 1st degree AV block, and anemia.  Patient had successful cardioversion performed on 2/16 but at ~10pm 2/17 patient returned to A. Fib with RVR.  She is not a candidate for ablation.  *Consult pulmonology re CTA findings * Appreciate cardiology recs * Continue Lasix --> transition to PO lasix today.  40 mg once daily  (rising Cr) * Amiodarone increased to 400 mg once daily --> plan to repeat cardioversion on this dose on Monday * Increase metoprolol tartrate to 25 mg PO q6h - held for bradycardia following cardioversion * Continue Eliquis 2.5 mg twice daily * Continue telemetry  * Strict I/Os, Daily weights, monitor electrolytes  # AKI on CKD stage 3: Cr increasing again (1.22 >> 1.3).  Likely related to vascular congestion from acute decompensated heart failure.  Per cardiology recs, decreased frequency of diuresis to once daily due to elevated Cr.    #Electrolyte abnormalities:  K+ low today.  Replete as needed.  -  Replete K+, KCl 10 meq TID  # Microcytic anemia: Likely iron deficiency anemia with low iron and low ferritin.  Gave IV Feraheme x1 on 2/18.  Cardiology started iron replacement therapy (iron polysaccharides 150 mg daily) on 2/20.    PT/OT: PT/OT recommend 24-hour supervision. No followup/equipment needed Diet: Heart DVT Ppx: Eliquis 2.5 mg twice daily Dispo: Anticipated discharge in approximately 2-3 days  Johny Blamer, Medical Student 11/28/2019, 3:17 PM

## 2019-11-28 NOTE — Plan of Care (Signed)

## 2019-11-28 NOTE — Plan of Care (Addendum)
PCCM brief plan of care note  Pulmonary consult requested for possible amiodarone toxicity in setting of Afib, receiving increasing doses of amiodarone this admission (chronic outpatient amiodarone use reported x several years). Acute HFrEF this admission and is presently on 4LNC.   Pulmonary will formally see 11/29/19. Please re-engage PCCM overnight if urgent concerns develop or patient decompensates.    Eliseo Gum MSN, AGACNP-BC [place] [id number] If no answer, [id number] 11/28/2019, 6:41 PM  Attending addendum: Brief review: should look for aspiration before we blame this on amiodarone toxicity, her hiatal hernia is impressive and she has a history of stroke.  SLP consult placed, low threshold for esophogram.  I don't think pushing diuresis is a bad idea either.  Will see formally in AM.  Erskine Emery

## 2019-11-28 NOTE — Progress Notes (Signed)
Patient coughing and it is making her anxious. MD paged to get cough medicine PRN. Awaiting new orders.

## 2019-11-28 NOTE — Progress Notes (Signed)
Progress Note   Subjective   Denies chest pain or shortness of breath.  She says she is a little bit confused..  Inpatient Medications    Scheduled Meds: . amiodarone  400 mg Oral Daily  . apixaban  2.5 mg Oral BID  . cholecalciferol  1,000 Units Oral Daily  . feeding supplement (ENSURE ENLIVE)  237 mL Oral QHS  . feeding supplement (PRO-STAT SUGAR FREE 64)  30 mL Oral BID  . FLUoxetine  40 mg Oral Daily  . furosemide  40 mg Oral BID  . latanoprost  1 drop Both Eyes QHS  . levothyroxine  175 mcg Oral Q0600  . metoprolol tartrate  25 mg Oral Q6H  . multivitamin with minerals  1 tablet Oral Daily  . pantoprazole  80 mg Oral Daily  . traZODone  75 mg Oral QHS   Continuous Infusions:  PRN Meds: acetaminophen **OR** acetaminophen, hydrocortisone cream, promethazine, senna-docusate   Vital Signs    Vitals:   11/27/19 1113 11/27/19 1700 11/27/19 1947 11/28/19 0441  BP: 107/60  104/63 (!) 106/58  Pulse: 94  99 (!) 107  Resp: 20  (!) 22 20  Temp: 97.9 F (36.6 C)  98.4 F (36.9 C) 98 F (36.7 C)  TempSrc: Oral     SpO2: 95% 91% 94% 91%  Weight:    57.5 kg  Height:        Intake/Output Summary (Last 24 hours) at 11/28/2019 0834 Last data filed at 11/28/2019 0500 Gross per 24 hour  Intake 1200 ml  Output 900 ml  Net 300 ml   Filed Weights   11/26/19 0456 11/27/19 0100 11/28/19 0441  Weight: 59.5 kg 56.9 kg 57.5 kg    Telemetry    Telemetry Personally reviewed atrial fibrillation with moderately rapid ventricular rates  Physical Exam  Well developed and frail in no acute distress HENT normal Neck supple   Clear Irregularly irregular rate and rhythm with  rapid  ventricular response, no murmurs or gallops Abd-soft with active BS without hepatomegaly No Clubbing cyanosis edema Skin-warm and dry A & Oriented  Grossly normal sensory and motor function    Labs    Chemistry Recent Labs  Lab 11/24/19 2142 11/25/19 0825 11/26/19 0507 11/27/19 0455  11/28/19 0722  NA 134*   < > 135 137 131*  K 4.9   < > 3.2* 3.8 3.3*  CL 102   < > 98 96* 95*  CO2 20*   < > 25 27 25   GLUCOSE 110*   < > 102* 80 109*  BUN 23   < > 27* 24* 31*  CREATININE 1.62*   < > 1.57* 1.22* 1.30*  CALCIUM 9.0   < > 9.3 9.5 9.2  PROT 7.1  --   --   --   --   ALBUMIN 3.0*  --   --   --   --   AST 31  --   --   --   --   ALT 16  --   --   --   --   ALKPHOS 79  --   --   --   --   BILITOT 1.1  --   --   --   --   GFRNONAA 30*   < > 31* 42* 39*  GFRAA 34*   < > 36* 48* 45*  ANIONGAP 12   < > 12 14 11    < > = values in this interval not  displayed.     Hematology Recent Labs  Lab 11/26/19 0507 11/27/19 0455 11/28/19 0722  WBC 11.3* 13.0* 12.0*  RBC 4.12 4.56 4.27  HGB 9.1* 10.1* 9.5*  HCT 31.0* 34.2* 32.1*  MCV 75.2* 75.0* 75.2*  MCH 22.1* 22.1* 22.2*  MCHC 29.4* 29.5* 29.6*  RDW 17.3* 17.5* 17.8*  PLT 305 320 320      Assessment & Plan    1.  Persistent atrial fibrillation/ atypical atrial flutter RVR continues  Our options are limited.  She is not an ablation candidate.  We should avoid device implant if able. I agree with Dr Curt Bears that best option may be to increase amiodarone and then proceed with cardioversion on Monday. Continue amiodarone 400mg  daily in the interim. Increase metoprolol to 25mg  PO q6 hours and titrate over the weekend. Continue on eliquis given prior stroke  2. 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  Her amiodarone has been increased and the plan is a repeat cardioversion earlier this week.  I wonder given the constellation of renal insufficiency, conduction system  disease with right bundle branch block first-degree AV block and anemia (not withstanding evidence of iron deficiency) whether she might have amyloid.  We will begin her on iron replacement.  Continue amiodarone with cardioversion anticipated on Monday.  We will deferred evaluation of amyloid to her primary team.  Replete potassium.  Change diuretic to once daily   Bun Cr increasing

## 2019-11-29 ENCOUNTER — Inpatient Hospital Stay (HOSPITAL_COMMUNITY): Payer: PPO

## 2019-11-29 DIAGNOSIS — J9601 Acute respiratory failure with hypoxia: Secondary | ICD-10-CM

## 2019-11-29 DIAGNOSIS — Z9189 Other specified personal risk factors, not elsewhere classified: Secondary | ICD-10-CM

## 2019-11-29 DIAGNOSIS — Z79899 Other long term (current) drug therapy: Secondary | ICD-10-CM

## 2019-11-29 LAB — BASIC METABOLIC PANEL
Anion gap: 11 (ref 5–15)
BUN: 39 mg/dL — ABNORMAL HIGH (ref 8–23)
CO2: 26 mmol/L (ref 22–32)
Calcium: 9.2 mg/dL (ref 8.9–10.3)
Chloride: 96 mmol/L — ABNORMAL LOW (ref 98–111)
Creatinine, Ser: 1.25 mg/dL — ABNORMAL HIGH (ref 0.44–1.00)
GFR calc Af Amer: 47 mL/min — ABNORMAL LOW (ref >60)
GFR calc non Af Amer: 41 mL/min — ABNORMAL LOW (ref >60)
Glucose, Bld: 105 mg/dL — ABNORMAL HIGH (ref 70–99)
Potassium: 3.6 mmol/L (ref 3.5–5.1)
Sodium: 133 mmol/L — ABNORMAL LOW (ref 135–145)

## 2019-11-29 LAB — CBC
HCT: 32.3 % — ABNORMAL LOW (ref 36.0–46.0)
Hemoglobin: 9.4 g/dL — ABNORMAL LOW (ref 12.0–15.0)
MCH: 22.4 pg — ABNORMAL LOW (ref 26.0–34.0)
MCHC: 29.1 g/dL — ABNORMAL LOW (ref 30.0–36.0)
MCV: 77.1 fL — ABNORMAL LOW (ref 80.0–100.0)
Platelets: 341 10*3/uL (ref 150–400)
RBC: 4.19 MIL/uL (ref 3.87–5.11)
RDW: 18.3 % — ABNORMAL HIGH (ref 11.5–15.5)
WBC: 10 10*3/uL (ref 4.0–10.5)
nRBC: 0.2 % (ref 0.0–0.2)

## 2019-11-29 LAB — MAGNESIUM: Magnesium: 2 mg/dL (ref 1.7–2.4)

## 2019-11-29 MED ORDER — FUROSEMIDE 10 MG/ML IJ SOLN
40.0000 mg | Freq: Once | INTRAMUSCULAR | Status: AC
  Administered 2019-11-29: 40 mg via INTRAVENOUS
  Filled 2019-11-29: qty 4

## 2019-11-29 MED ORDER — NITROGLYCERIN 0.4 MG SL SUBL
SUBLINGUAL_TABLET | SUBLINGUAL | Status: AC
  Filled 2019-11-29: qty 1

## 2019-11-29 NOTE — H&P (View-Only) (Signed)
Progress Note   Subjective    The patient denies chest pain, shortness of breath, nocturnal dyspnea, orthopnea or peripheral edem.  There have been no palpitations, lightheadedness or syncope Less confused.    Inpatient Medications    Scheduled Meds: . amiodarone  400 mg Oral Daily  . apixaban  2.5 mg Oral BID  . cholecalciferol  1,000 Units Oral Daily  . feeding supplement (ENSURE ENLIVE)  237 mL Oral QHS  . feeding supplement (PRO-STAT SUGAR FREE 64)  30 mL Oral BID  . FLUoxetine  40 mg Oral Daily  . furosemide  40 mg Oral Daily  . iron polysaccharides  150 mg Oral Daily  . latanoprost  1 drop Both Eyes QHS  . levothyroxine  175 mcg Oral Q0600  . metoprolol tartrate  25 mg Oral Q6H  . multivitamin with minerals  1 tablet Oral Daily  . pantoprazole  80 mg Oral Daily  . potassium chloride  10 mEq Oral TID  . traZODone  75 mg Oral QHS   Continuous Infusions:  PRN Meds: acetaminophen **OR** acetaminophen, benzonatate, hydrocortisone cream, promethazine, senna-docusate   Vital Signs    Vitals:   11/28/19 0441 11/28/19 1505 11/28/19 2023 11/29/19 0340  BP: (!) 106/58  115/67 (!) 118/58  Pulse: (!) 107 (!) 101 89 81  Resp: 20  18 18   Temp: 98 F (36.7 C)  98.6 F (37 C) 97.6 F (36.4 C)  TempSrc:   Oral Oral  SpO2: 91%  92% 97%  Weight: 57.5 kg   57.4 kg  Height:        Intake/Output Summary (Last 24 hours) at 11/29/2019 0817 Last data filed at 11/29/2019 0344 Gross per 24 hour  Intake 1080 ml  Output 1150 ml  Net -70 ml   Filed Weights   11/27/19 0100 11/28/19 0441 11/29/19 0340  Weight: 56.9 kg 57.5 kg 57.4 kg    Telemetry    Telemetry Personally reviewed afib  90-100  Physical Exam  Elderly and frail caucasian female in no acute distress HENT normal Neck supple with JVP-flat Carotids brisk and full without bruits Clear Irregularly irregular rate and rhythm with controlled ventricular response, no murmurs or gallops Abd-soft with active BS  without hepatomegaly No Clubbing cyanosis edema Skin-warm and dry A & Oriented  Grossly normal sensory and motor function   Labs    Chemistry Recent Labs  Lab 11/24/19 2142 11/25/19 0825 11/27/19 0455 11/28/19 0722 11/29/19 0407  NA 134*   < > 137 131* 133*  K 4.9   < > 3.8 3.3* 3.6  CL 102   < > 96* 95* 96*  CO2 20*   < > 27 25 26   GLUCOSE 110*   < > 80 109* 105*  BUN 23   < > 24* 31* 39*  CREATININE 1.62*   < > 1.22* 1.30* 1.25*  CALCIUM 9.0   < > 9.5 9.2 9.2  PROT 7.1  --   --   --   --   ALBUMIN 3.0*  --   --   --   --   AST 31  --   --   --   --   ALT 16  --   --   --   --   ALKPHOS 79  --   --   --   --   BILITOT 1.1  --   --   --   --   GFRNONAA 30*   < >  42* 39* 41*  GFRAA 34*   < > 48* 45* 47*  ANIONGAP 12   < > 14 11 11    < > = values in this interval not displayed.     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  Her amiodarone has been increased DCCV scheduled in am  I wonder given the constellation of renal insufficiency, conduction system disease with right bundle branch block first-degree AV block and anemia (not withstanding evidence of iron deficiency) whether she might have amyloid.  We will defer evaluation of possible amyloid to her primary team.    Bun/Cr better on once daily diuretic

## 2019-11-29 NOTE — Plan of Care (Signed)
  Problem: Safety: Goal: Ability to remain free from injury will improve Outcome: Progressing   Problem: Skin Integrity: Goal: Risk for impaired skin integrity will decrease Outcome: Progressing   

## 2019-11-29 NOTE — Consult Note (Signed)
NAME:  Teresa York, MRN:  [id number], DOB:  1940/12/24, LOS: 6 ADMISSION DATE:  11/22/2019, CONSULTATION DATE:  11/29/2019  REFERRING MD:  Oda Kilts, MD, CHIEF COMPLAINT:  "amiodarone toxicity?"   History of present illness   This is a an 79 year old woman with a past medical history of chronic atrial fibrillation, heart failure reduced ejection fraction, and thromboembolic stroke in 1950.  Both she and her husband note that she has been having chronic issues with dyspnea on exertion for probably the past 15 years.  At least the last 5 to 7 years she has been severely debilitated and does not walk around much and usually immediately needs to rest of the public.  She has been especially debilitated since her stroke in 2018.  She has a diagnosis of sleep apnea both central and obstructive but has never been able to tolerate CPAP.  She also has Barrett's esophagus and has chronic cough which is mostly only at nighttime and she sleeps on 3 pillows at home.  He also is on a PPI for GERD.  She has been on amiodarone since 2018 for refractory A. fib and has had multiple cardioversions.  She has also been on anticoagulation but has had difficulty with frequent falls and several significant adverse effects of bleeding.  He is currently on apixaban.  Is been here for the last week trying to get her heart rate under better control and has subsequently been diuresed about 5 L.  There is a plan for a DCCV on Monday 2/22, and her amiodarone dose has been increased.  Pulmonary is being consulted to see if the hypoxemia and CT changes she has could be explained by amiodarone pulmonary toxicity.  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   Consults:  PCCM, cardiology  Procedures:    Significant Diagnostic Tests:   2/19 CT angio chest Shows patchy groundglass opacities with lower lobe predominance of small right-sided pleural effusion.  She also has some reactive lymphadenopathy in the hila and mediastinum.  2/14 echocardiogram 1. Left ventricular ejection fraction, by estimation, is 35 to 40%. The  left ventricle has moderately decreased function. The left ventricle  demonstrates global hypokinesis. Left ventricular diastolic function could  not be evaluated.  2. Right ventricular systolic function is moderately reduced. The right  ventricular size is normal. There is mildly elevated pulmonary artery  systolic pressure. The estimated right ventricular systolic pressure is  93.2 mmHg.  3. Left atrial size was severely dilated.  4. Right atrial size was severely dilated.  5. The mitral valve is normal in structure and function. Mild to moderate  mitral valve regurgitation.  6. The aortic valve is tricuspid. Aortic valve regurgitation is moderate.  Mild to moderate aortic valve sclerosis/calcification is present, without  any evidence of aortic stenosis.  7. The inferior vena cava is normal in size with <50% respiratory  variability, suggesting right atrial pressure of 8 mmHg.   Micro Data:  2/14 Covid negative  Antimicrobials:    Objective   Blood pressure (!) 118/58, pulse 81, temperature 97.6 F (36.4 C),  temperature source Oral, resp. rate 18, height _0  (1.549 m), weight 57.4 kg, SpO2 97 %.        Intake/Output Summary (Last 24 hours) at 11/29/2019 0802 Last data filed at 11/29/2019 0344 Gross  per 24 hour  Intake 1080 ml  Output 1150 ml  Net -70 ml   Filed Weights   11/27/19 0100 11/28/19 0441 11/29/19 0340  Weight: 56.9 kg 57.5 kg 57.4 kg    Examination: General: Elderly appearing woman, no respiratory distress, sitting up in bed on nasal cannula. HENT: Mucous membranes moist Lungs: Bilateral crackles in both lung fields, no wheezes, no respiratory distress Cardiovascular: Irregularly irregular, heart rate in the 100s Abdomen: Scaphoid, soft Extremities: No edema Neuro: Normal speech, frequently forgetful, moves all 4 extremities MSK: No acute synovitis Lines/Tubes: PIV  Assessment & Plan:  Teresa York is an 79 year old woman with significant past medical history of atrial fibrillation which has been refractory to multiple cardioversion attempts, history of thromboembolic stroke likely secondary to A. fib on anticoagulation, Barrett's esophagus with chronic reflux and cough, and now worsening respiratory failure with an abnormal CT chest.   Acute hypoxemic respiratory failure Abnormal CT chest, with diffuse groundglass opacities Atrial fibrillation on amiodarone  This is Bodner has been on amiodarone for about 2 years.  Her major risk factors for developing amiodarone pulmonary toxicity are her advanced age, and her duration of chronic use.  If there is an alternate agent available for antiarrhythmics for her atrial fibrillation, it might be a good idea to switch to that.  I see that an AV node ablation and permanent pacemaker being discussed potential treatment.  This might be a better option for her if her cardioversion tomorrow was not successful.  Her risk for returning back into A. fib is quite high based on her untreated sleep apnea and previous history.  Her CT chest findings certainly could be consistent with amiodarone pulmonary toxicity since she has already been diuresed significantly during this hospital stay.  I discussed with her and her husband a possible  bronchoscopy later this week with BAL and they are amenable to it if it may help make a diagnosis.  In the meantime I would continue with the current plan with amiodarone and DCCV tomorrow.  Keep diuresing her.  She has had a speech evaluation that denies overt oropharyngeal aspiration, but her Barrett's esophagus hiatal hernia and chronic GERD to put her at risk for lower aspiration as well.  She has had significant side effects with anticoagulation and falls, and it seems like her quality of life has really deteriorated over the last 2 years.  Palliative care consultation may be helpful in assisting with goals of care as she has multiple chronic illnesses which require complex medical decision making.  Pulmonary will follow.  Lenice Llamas, MD Pulmonary and [place] Pager: [place]   CBC: Recent Labs  Lab 11/25/19 0444 11/26/19 0507 11/27/19 0455 11/28/19 0722 11/29/19 0407  WBC 11.2* 11.3* 13.0* 12.0* 10.0  NEUTROABS 8.6*  --   --   --   --   HGB 8.9* 9.1* 10.1* 9.5* 9.4*  HCT 30.4* 31.0* 34.2* 32.1* 32.3*  MCV 76.6* 75.2* 75.0* 75.2* 77.1*  PLT 303 305 320 320 160    Basic Metabolic Panel: Recent Labs  Lab 11/25/19 0825 11/26/19 0507 11/27/19 0455 11/28/19 0722 11/29/19 0407  NA 136 135 137 131* 133*  K 3.9 3.2* 3.8 3.3* 3.6  CL 101  98 96* 95* 96*  CO2 _0 GLUCOSE 133* 102* 80 109* 105*  BUN 23 27* 24* 31* 39*  CREATININE 1.57* 1.57* 1.22* 1.30* 1.25*  CALCIUM 9.2 9.3 9.5 9.2 9.2  MG  --   --  1.5* 2.1 2.0   GFR: Estimated Creatinine Clearance: 29.2 mL/min (A) (by C-G formula based on SCr of 1.25 mg/dL (H)). Recent Labs  Lab 11/26/19 0507 11/27/19 0455 11/28/19 0722 11/29/19 0407  PROCALCITON 0.11  --   --   --   WBC 11.3* 13.0* 12.0* 10.0    Liver Function Tests: Recent Labs  Lab 11/24/19 2142  AST 31  ALT 16  ALKPHOS 79  BILITOT 1.1  PROT 7.1  ALBUMIN 3.0*   No results  for input(s): LIPASE, AMYLASE in the last 168 hours. No results for input(s): AMMONIA in the last 168 hours.  ABG    Component Value Date/Time   PHART 7.474 (H) 11/24/2019 1754   PCO2ART 31.7 (L) 11/24/2019 1754   PO2ART 100 11/24/2019 1754   HCO3 23.1 11/24/2019 1754   TCO2 27 11/13/2017 2211   ACIDBASEDEF 0.1 11/24/2019 1754   O2SAT 98.2 11/24/2019 1754     Coagulation Profile: Recent Labs  Lab 11/24/19 1510 11/27/19 1952  INR 1.4* 1.7*    Cardiac Enzymes: No results for input(s): CKTOTAL, CKMB, CKMBINDEX, TROPONINI in the last 168 hours.  HbA1C: Hgb A1c MFr Bld  Date/Time Value Ref Range Status  11/14/2017 05:00 AM 4.8 4.8 - 5.6 % Final    Comment:    (NOTE) Pre diabetes:          5.7%-6.4% Diabetes:              >6.4% Glycemic control for   <7.0% adults with diabetes     CBG: Recent Labs  Lab 11/24/19 1728  GLUCAP 127*    Review of Systems:   Review of Systems  Constitutional: Negative for chills, fever and weight loss.  HENT: Positive for congestion. Negative for sinus pain and sore throat.        Chronic post nasal drip   Eyes: Negative for discharge and redness.  Respiratory: Positive for cough and shortness of breath. Negative for hemoptysis, sputum production and wheezing.   Cardiovascular: Negative for chest pain, palpitations and leg swelling.  Gastrointestinal: Positive for heartburn. Negative for nausea and vomiting.  Musculoskeletal: Negative for joint pain and myalgias.  Skin: Negative for rash.  Neurological: Positive for weakness. Negative for dizziness, tremors, focal weakness and headaches.  Endo/Heme/Allergies: Negative for environmental allergies.  Psychiatric/Behavioral: Negative for depression. The patient is not nervous/anxious.   All other systems reviewed and are negative.    Surgical History    Past Surgical History:  Procedure Laterality Date  . BACK SURGERY    . BREAST BIOPSY Left 2005  . BREAST LUMPECTOMY Left 2005    . CARDIOVERSION N/A 11/02/2016   Procedure: CARDIOVERSION;  Surgeon: Thayer Headings, MD;  Location: [facility];  Service: Cardiovascular;  Laterality: N/A;  . CARDIOVERSION N/A 11/16/2016   Procedure: CARDIOVERSION;  Surgeon: Fay Records, MD;  Location: [facility] ENDOSCOPY;  Service: Cardiovascular;  Laterality: N/A;  . CARDIOVERSION N/A 12/24/2016   Procedure: CARDIOVERSION;  Surgeon: Sanda Klein, MD;  Location: [facility] ENDOSCOPY;  Service: Cardiovascular;  Laterality: N/A;  . CARDIOVERSION N/A 11/24/2019   Procedure: CARDIOVERSION;  Surgeon: Buford Dresser, MD;  Location: [facility];  Service: Cardiovascular;  Laterality: N/A;  . CARPAL TUNNEL RELEASE Bilateral 1995  .  CHOLECYSTECTOMY  08/2018  . COLONOSCOPY    . ELBOW FRACTURE SURGERY Right 2009   with implant  . ESOPHAGOGASTRODUODENOSCOPY    . ESOPHAGUS SURGERY     "in the process of getting cancerous cell off my esophagus" (10/31/2016)  . FRACTURE SURGERY    . IR CT HEAD LTD  11/14/2017  . IR PERCUTANEOUS ART THROMBECTOMY/INFUSION INTRACRANIAL INC DIAG ANGIO  11/14/2017  . LUMBAR DISC SURGERY     L5  . MOHS SURGERY     "corner of right eye; on left cheek"  . RADIOLOGY WITH ANESTHESIA N/A 11/13/2017   Procedure: IR WITH ANESTHESIA;  Surgeon: Radiologist, Medication, MD;  Location: [facility];  Service: Radiology;  Laterality: N/A;  . TEE WITHOUT CARDIOVERSION N/A 11/16/2016   Procedure: TRANSESOPHAGEAL ECHOCARDIOGRAM (TEE);  Surgeon: Fay Records, MD;  Location: [facility];  Service: Cardiovascular;  Laterality: N/A;  . TONSILLECTOMY    . TUBAL LIGATION  1980     Social History   reports that she has never smoked. She has never used smokeless tobacco. She reports current alcohol use. She reports that she does not use drugs.   Family History   Her family history includes Bone cancer in her daughter; Breast cancer in her daughter; Congestive Heart Failure in her father; Heart attack (age of onset: 45) in her father; Thyroid cancer in her  sister. There is no history of Colon cancer, Esophageal cancer, or Stomach cancer.   Allergies Allergies  Allergen Reactions  . Benazepril Cough    Cough  . Diltiazem Hcl Rash     Home Medications  Prior to Admission medications   Medication Sig Start Date End Date Taking? Authorizing Provider  acetaminophen (TYLENOL) 325 MG tablet Take 650 mg by mouth 2 (two) times daily as needed for mild pain.    Yes [provider]  amiodarone (PACERONE) 200 MG tablet Take 0.5 tablets (100 mg total) by mouth daily. 11/17/18  Yes Allred, Jeneen Rinks, MD  apixaban (ELIQUIS) 5 MG TABS tablet Take 1 tablet (5 mg total) by mouth 2 (two) times daily. 08/04/19  Yes Allred, Jeneen Rinks, MD  cetirizine (ZYRTEC) 10 MG tablet Take 10 mg by mouth daily. Reported on 10/27/2015   Yes [provider]  Cholecalciferol (D 1000) 1000 units capsule Take 1,000 Units by mouth daily. Reported on 11/03/2015 11/30/14  Yes [provider]  diphenhydramine-acetaminophen (TYLENOL PM) 25-500 MG TABS tablet Take 1 tablet by mouth at bedtime as needed (sleep).   Yes [provider]  FLUoxetine (PROZAC) 40 MG capsule Take 40 mg by mouth daily.   Yes [provider]  fluticasone (FLONASE) 50 MCG/ACT nasal spray Place 1 spray into both nostrils daily as needed for allergies.  12/27/17  Yes [provider]  latanoprost (XALATAN) 0.005 % ophthalmic solution Place 1 drop into both eyes at bedtime. 11/12/19  Yes [provider]  levothyroxine (SYNTHROID) 175 MCG tablet Take 175 mcg by mouth daily before breakfast.    Yes [provider]  Magnesium Oxide (MAGOX 400 PO) Take 400 mg by mouth daily.    Yes [provider]  meclizine (ANTIVERT) 25 MG tablet Take 25 mg by mouth 3 (three) times daily as needed for dizziness or nausea.  06/02/18  Yes [provider]  Multiple Vitamins-Minerals (ICAPS) CAPS Take 1 capsule by mouth 2 (two) times daily.    Yes [provider]  omeprazole (PRILOSEC) 40 MG capsule Take 40 mg by mouth at bedtime.  Yes [provider]  ondansetron (ZOFRAN) 4 MG tablet Take 4 mg by mouth every 8 (eight) hours as needed for nausea or vomiting.  06/02/18  Yes [provider]  potassium chloride (KLOR-CON) 10 MEQ tablet Take 10 mEq by mouth daily.   Yes [provider]  Probiotic Product (DAILY PROBIOTIC) CAPS Take 1 tablet by mouth daily.   Yes [provider]  spironolactone (ALDACTONE) 50 MG tablet Take 50 mg by mouth daily as needed (ankle swelling).   Yes [provider]  traZODone (DESYREL) 150 MG tablet Take 75 mg by mouth at bedtime. 02/06/18  Yes [provider]

## 2019-11-29 NOTE — Progress Notes (Signed)
Progress Note   Subjective    The patient denies chest pain, shortness of breath, nocturnal dyspnea, orthopnea or peripheral edem.  There have been no palpitations, lightheadedness or syncope Less confused.    Inpatient Medications    Scheduled Meds: . amiodarone  400 mg Oral Daily  . apixaban  2.5 mg Oral BID  . cholecalciferol  1,000 Units Oral Daily  . feeding supplement (ENSURE ENLIVE)  237 mL Oral QHS  . feeding supplement (PRO-STAT SUGAR FREE 64)  30 mL Oral BID  . FLUoxetine  40 mg Oral Daily  . furosemide  40 mg Oral Daily  . iron polysaccharides  150 mg Oral Daily  . latanoprost  1 drop Both Eyes QHS  . levothyroxine  175 mcg Oral Q0600  . metoprolol tartrate  25 mg Oral Q6H  . multivitamin with minerals  1 tablet Oral Daily  . pantoprazole  80 mg Oral Daily  . potassium chloride  10 mEq Oral TID  . traZODone  75 mg Oral QHS   Continuous Infusions:  PRN Meds: acetaminophen **OR** acetaminophen, benzonatate, hydrocortisone cream, promethazine, senna-docusate   Vital Signs    Vitals:   11/28/19 0441 11/28/19 1505 11/28/19 2023 11/29/19 0340  BP: (!) 106/58  115/67 (!) 118/58  Pulse: (!) 107 (!) 101 89 81  Resp: 20  18 18   Temp: 98 F (36.7 C)  98.6 F (37 C) 97.6 F (36.4 C)  TempSrc:   Oral Oral  SpO2: 91%  92% 97%  Weight: 57.5 kg   57.4 kg  Height:        Intake/Output Summary (Last 24 hours) at 11/29/2019 0817 Last data filed at 11/29/2019 0344 Gross per 24 hour  Intake 1080 ml  Output 1150 ml  Net -70 ml   Filed Weights   11/27/19 0100 11/28/19 0441 11/29/19 0340  Weight: 56.9 kg 57.5 kg 57.4 kg    Telemetry    Telemetry Personally reviewed afib  90-100  Physical Exam  Elderly and frail caucasian female in no acute distress HENT normal Neck supple with JVP-flat Carotids brisk and full without bruits Clear Irregularly irregular rate and rhythm with controlled ventricular response, no murmurs or gallops Abd-soft with active BS  without hepatomegaly No Clubbing cyanosis edema Skin-warm and dry A & Oriented  Grossly normal sensory and motor function   Labs    Chemistry Recent Labs  Lab 11/24/19 2142 11/25/19 0825 11/27/19 0455 11/28/19 0722 11/29/19 0407  NA 134*   < > 137 131* 133*  K 4.9   < > 3.8 3.3* 3.6  CL 102   < > 96* 95* 96*  CO2 20*   < > 27 25 26   GLUCOSE 110*   < > 80 109* 105*  BUN 23   < > 24* 31* 39*  CREATININE 1.62*   < > 1.22* 1.30* 1.25*  CALCIUM 9.0   < > 9.5 9.2 9.2  PROT 7.1  --   --   --   --   ALBUMIN 3.0*  --   --   --   --   AST 31  --   --   --   --   ALT 16  --   --   --   --   ALKPHOS 79  --   --   --   --   BILITOT 1.1  --   --   --   --   GFRNONAA 30*   < >  42* 39* 41*  GFRAA 34*   < > 48* 45* 47*  ANIONGAP 12   < > 14 11 11    < > = values in this interval not displayed.     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  Her amiodarone has been increased DCCV scheduled in am  I wonder given the constellation of renal insufficiency, conduction system disease with right bundle branch block first-degree AV block and anemia (not withstanding evidence of iron deficiency) whether she might have amyloid.  We will defer evaluation of possible amyloid to her primary team.    Bun/Cr better on once daily diuretic

## 2019-11-29 NOTE — Progress Notes (Signed)
Paged by nurse reporting that patient was starting to have some worsening shortness of breath, intermittently was becoming hypoxic on her 3L [place], decreased to 83% then returned to 90%. Patient was reporting that she felt like she was dying. Nurse paged rapid response. Went to evaluate patient. Patient was sitting in bed in moderate distress. She reported that she was getting more short of breath and was having some palpitations. She denied any current chest pain however reported that she was having some pain in the morning. On exam she appeared uncomfortable, cardiac exam irregularly irregular, no m/r/g, normal work of breathing with bibasilar crackles on lung exam, suprapubic tenderness to palpation. Was on 3L, increased to 5L and started on HFNC. Considerations of her acute hypoxic respiratory failure during her hospitalization include HF exacerbation worsened by uncontrolled atrial fibrillation, amiodarine toxicity, vs probable amyloidosis. Given her bibasilar crackles with continue with further diuresis for now. She is currently on metoprolol, Hrs around 100-110. Will consider additional dose of metoprolol if diuresis does not improve symptoms.   -Bladder scan -Lasix 40 mg IV once -EKG -Consider metoprolol if remains tachycardic

## 2019-11-29 NOTE — Evaluation (Addendum)
Clinical/Bedside Swallow Evaluation Patient Details  Name: Teresa York MRN: [id number] Date of Birth: [birth year]  Today's Date: 11/29/2019 Time: SLP Start Time (ACUTE ONLY): 0750 SLP Stop Time (ACUTE ONLY): 0810 SLP Time Calculation (min) (ACUTE ONLY): 20 min  Past Medical History:  Past Medical History:  Diagnosis Date  . Allergic rhinitis   . Anxiety   . Atrial fibrillation ([place])   . Barrett's esophagus   . Breast cancer ([place]) 2005   "left"  . Chronic lower back pain   . Chronic systolic CHF (congestive heart failure) (HCC)    a. EF 30-35% felt to be possibly be tachy mediated from afib wtih RVR  . Depression   . Diverticulosis   . GERD (gastroesophageal reflux disease)   . Hiatal hernia   . Hx of radiation therapy 2005  . Hypertension   . Hypothyroidism   . Left atrial enlargement   . 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 Mitral regurgitation   . OAB (overactive bladder)   . Persistent atrial fibrillation ([place]) 10/31/2016   a. s/p failed DCCV x3. Now on amiodarone.   . 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 Stroke [facility])    Past Surgical History:  Past Surgical History:  Procedure Laterality Date  . BACK SURGERY    . BREAST BIOPSY Left 2005  . BREAST LUMPECTOMY Left 2005  . CARDIOVERSION N/A 11/02/2016   Procedure: CARDIOVERSION;  Surgeon: Thayer Headings, MD;  Location: [place];  Service: Cardiovascular;  Laterality: N/A;  . CARDIOVERSION N/A 11/16/2016   Procedure: CARDIOVERSION;  Surgeon: Fay Records, MD;  Location: [facility] ENDOSCOPY;  Service: Cardiovascular;  Laterality: N/A;  . CARDIOVERSION N/A 12/24/2016   Procedure: CARDIOVERSION;  Surgeon: Sanda Klein, MD;  Location: [facility] ENDOSCOPY;  Service: Cardiovascular;  Laterality: N/A;  . CARDIOVERSION N/A 11/24/2019   Procedure: CARDIOVERSION;  Surgeon: Buford Dresser, MD;  Location: [place];  Service: Cardiovascular;  Laterality:  N/A;  . CARPAL TUNNEL RELEASE Bilateral 1995  . CHOLECYSTECTOMY  08/2018  . COLONOSCOPY    . ELBOW FRACTURE SURGERY Right 2009   with implant  . ESOPHAGOGASTRODUODENOSCOPY    . ESOPHAGUS SURGERY     "in the process of getting cancerous cell off my esophagus" (10/31/2016)  . FRACTURE SURGERY    . IR CT HEAD LTD  11/14/2017  . IR PERCUTANEOUS ART THROMBECTOMY/INFUSION INTRACRANIAL INC DIAG ANGIO  11/14/2017  . LUMBAR DISC SURGERY     L5  . MOHS SURGERY     "corner of right eye; on left cheek"  . RADIOLOGY WITH ANESTHESIA N/A 11/13/2017   Procedure: IR WITH ANESTHESIA;  Surgeon: Radiologist, Medication, MD;  Location: [place];  Service: Radiology;  Laterality: N/A;  . TEE WITHOUT CARDIOVERSION N/A 11/16/2016   Procedure: TRANSESOPHAGEAL ECHOCARDIOGRAM (TEE);  Surgeon: Fay Records, MD;  Location: [place];  Service: Cardiovascular;  Laterality: N/A;  . TONSILLECTOMY    . TUBAL LIGATION  [id number]   HPI:  79 year old person living with atrial fibrillation complicated by a stroke in 2019 presented to the emergency department with dyspnea and chest pain and admitted to the internal medicine service for acute heart failure with reduced ejection fraction.  On exam this morning the patient is still having mild chest pain/pressure at the mid sternum which radiates to her back.  Also still experiencing dyspnea at rest.  On exam she has a distended jugular vein to 7 cm above the  sternum, consistent with elevated right atrial pressures to about 12.  She has 1+ pitting edema in both of her lower extremities, lungs are clear with no crackles.  High sensitivity troponin levels have been normal.  EKG shows atrial fibrillation and right bundle branch block with no ischemic changes.  Cardiac ultrasound shows moderately reduced left ventricular systolic function, mild mitral regurgitation, severely dilated left atrium, moderately reduced right ventricular systolic function with estimated elevated pulmonary pressures.   Etiology for the acutely reduced left ventricular function could be tachycardia induced from atrial fibrillation.  It does not look like she has had an ischemic evaluation in the past, given ongoing chest pressure we will consult with cardiology to see if we should pursue an ischemic evaluation during this admission.  I agree with diuresis with IV Lasix, she is responding well so far to 40 mg IV, would dose twice daily.  She has an unknown dry weight as she has been losing significant amount of weight over the last year.  She had an elevation in her creatinine overnight from 1.0 to 1.4 in the setting of diuresis, but I do not think that she is hypovolemic today.  Would continue to diurese but monitor closely, hold on initiating spironolactone until renal function stabilizes.  For the atrial fibrillation with rapid ventricular response, she is on amiodarone 100 mg daily and apixaban 5 mg twice daily.  Probably okay to start low-dose beta-blocker, but will consult with cardiology first in case they want to do a pharmacologic stress test.  Critical care MD is concerned for possible aspiration.     Assessment / Plan / Recommendation Clinical Impression  Clinical swallowing evaluation was completed using the patient's breakfast tray.  She is known to [place] service from previous admission with a clinical swallowing evaluation completed on 11/14/2017.  At that time there were no findings of dysphagia and a mechanical soft diet with thin liquids was recommended.  ST did not follow up during that admission. She has history of large hiatal hernia and per patient Barret's esophagus.   Most recent chest xray was showing progressive infiltrate in the RUL adjacent to the minor fissure and stable diffuse interstitial accentuation throughout the left lung.  Currently the patient does not endorse trouble swallowing.  She complains of feeling fatigued and wanted to know if she would ever feel better.    Cranial nerve exam was  completed and remarkable for left sided facial weakness that encompassed the upper and lower portion of her face.  Otherwise, lingual, labial and jaw range of motion and strength appeared adequate.  Facial sensation appeared to be intact.  However, the patient did endorse a difference in sensation from the left to right side of her face complaining of a stinging sensation to the touch on the right.    Her oral and pharyngeal swallow appeared functional.  Mastication of dry solids appeared functional.  Swallow trigger was appreciated to palpation.  Intermittent throat clear was seen on dry solids only.  It was not seen on serial sips of thin liquids.  Recommend that she continue on her current diet.  ST will follow up for therapeutic diet tolerance.     SLP Visit Diagnosis: Dysphagia, unspecified (R13.10)    Aspiration Risk  Mild aspiration risk    Diet Recommendation   Regular with thin liquids  Medication Administration: Whole meds with liquid    Other  Recommendations Oral Care Recommendations: Oral care BID   Follow up Recommendations Other (comment)(TBD)  Frequency and Duration min 2x/week  2 weeks       Prognosis        Swallow Study   General Date of Onset: 11/22/19 HPI: 79 year old person living with atrial fibrillation complicated by a stroke in 2019 presented to the emergency department with dyspnea and chest pain and admitted to the internal medicine service for acute heart failure with reduced ejection fraction.  On exam this morning the patient is still having mild chest pain/pressure at the mid sternum which radiates to her back.  Also still experiencing dyspnea at rest.  On exam she has a distended jugular vein to 7 cm above the sternum, consistent with elevated right atrial pressures to about 12.  She has 1+ pitting edema in both of her lower extremities, lungs are clear with no crackles.  High sensitivity troponin levels have been normal.  EKG shows atrial  fibrillation and right bundle branch block with no ischemic changes.  Cardiac ultrasound shows moderately reduced left ventricular systolic function, mild mitral regurgitation, severely dilated left atrium, moderately reduced right ventricular systolic function with estimated elevated pulmonary pressures.  Etiology for the acutely reduced left ventricular function could be tachycardia induced from atrial fibrillation.  It does not look like she has had an ischemic evaluation in the past, given ongoing chest pressure we will consult with cardiology to see if we should pursue an ischemic evaluation during this admission.  I agree with diuresis with IV Lasix, she is responding well so far to 40 mg IV, would dose twice daily.  She has an unknown dry weight as she has been losing significant amount of weight over the last year.  She had an elevation in her creatinine overnight from 1.0 to 1.4 in the setting of diuresis, but I do not think that she is hypovolemic today.  Would continue to diurese but monitor closely, hold on initiating spironolactone until renal function stabilizes.  For the atrial fibrillation with rapid ventricular response, she is on amiodarone 100 mg daily and apixaban 5 mg twice daily.  Probably okay to start low-dose beta-blocker, but will consult with cardiology first in case they want to do a pharmacologic stress test.   Type of Study: Bedside Swallow Evaluation Previous Swallow Assessment: 11/14/2017 Rx Mch Soft/thin and signed off.   Diet Prior to this Study: Regular;Thin liquids Temperature Spikes Noted: No Respiratory Status: Nasal cannula History of Recent Intubation: No Behavior/Cognition: Alert;Cooperative;Pleasant mood Oral Cavity Assessment: Within Functional Limits Oral Care Completed by SLP: No Oral Cavity - Dentition: Adequate natural dentition Vision: Functional for self-feeding Self-Feeding Abilities: Able to feed self Patient Positioning: Upright in bed Baseline Vocal  Quality: Low vocal intensity Volitional Cough: (Not tested) Volitional Swallow: Unable to elicit    Oral/Motor/Sensory Function Overall Oral Motor/Sensory Function: Mild impairment Facial ROM: Reduced left Facial Symmetry: Abnormal symmetry left Facial Strength: Reduced left Facial Sensation: Within Functional Limits Lingual ROM: Within Functional Limits Lingual Symmetry: Within Functional Limits Lingual Strength: Within Functional Limits Mandible: Within Functional Limits   Ice Chips Ice chips: Not tested   Thin Liquid Thin Liquid: Within functional limits Presentation: Cup;Self Fed;Spoon;Straw    Nectar Thick Nectar Thick Liquid: Not tested   Honey Thick Honey Thick Liquid: Not tested   Puree Puree: Within functional limits Presentation: Spoon   Solid     Solid: Impaired Presentation: Self Fed Pharyngeal Phase Impairments: Throat Clearing - Delayed      Shelly Flatten, MA, CCC-SLP Acute Rehab SLP [phone]  Lamar Sprinkles 11/29/2019,8:35  AM     

## 2019-11-29 NOTE — Significant Event (Signed)
Rapid Response Event Note  Overview: Respiratory Distress - SOB  Initial Focused Assessment: Received a call from the nurse with concerns of the patient having shortness of breath and chest pain, I could her the patient in background moaning. Patient was awake but endorse not feeling well, she felt short of breath and endorsed that she felt like she was not getting any air. Very mild tachypnea, skin warm and dry, good palpable pulses in all extremities. Lung sounds - basilar crackles She was not in acute distress but endorsed having that "impending doom" sensation. Her presentation is very similar if not almost the same as the 2/16 when I saw her. Even today, after a few minutes stated her abdomen was hurting, felt nauseous, but then this resolved too. IMTS MD to bedside. EKG was already done prior my arrival. 88-90% on 4L [place], I increased to 6L [facility]NC - saturations improved 95% - HR 80-100s AF with stable BP. She is anxious, this could also contribute to her shortness of breath  Interventions: -- Lasix 40 mg IV  Plan of Care: -- After the patient had a BM, she felt better  -- Monitor UOP -- Monitor VS, wean oxygen as patient tolerates.   Event Summary:  Call Time 1751 End Time 1845    Cleola Perryman R

## 2019-11-29 NOTE — Progress Notes (Signed)
   Subjective:  [name] was seen and evaluated at bedside this morning. She states that she is feeling sleepy and not hungry. She states that she remembers being brought here for shortness of breath. Stated that she felt that she went around to a lot of places in her bed. Stated she went to [place] yesterday.  Objective:  Vital signs in last 24 hours: Vitals:   11/28/19 0441 11/28/19 1505 11/28/19 2023 11/29/19 0340  BP: (!) 106/58  115/67 (!) 118/58  Pulse: (!) 107 (!) 101 89 81  Resp: 20  18 18   Temp: 98 F (36.7 C)  98.6 F (37 C) 97.6 F (36.4 C)  TempSrc:   Oral Oral  SpO2: 91%  92% 97%  Weight: 57.5 kg   57.4 kg  Height:       Physical Exam  Constitutional: She is oriented to person, place, and time. She has a sickly appearance. She appears ill.  HENT:  Head: Normocephalic and atraumatic.  Eyes: Conjunctivae are normal.  Cardiovascular: Normal rate, regular rhythm and normal heart sounds.  Pulmonary/Chest: Effort normal and breath sounds normal. No respiratory distress. She has no wheezes.  Abdominal: Soft. Bowel sounds are normal. She exhibits no distension. There is no abdominal tenderness.  Musculoskeletal:        General: No edema.  Neurological: She is alert and oriented to person, place, and time.  Psychiatric: She has a normal mood and affect. Her behavior is normal. Judgment and thought content normal.    Assessment/Plan:  Principal Problem:   Acute HFrEF (heart failure with reduced ejection fraction) (HCC) Active Problems:   Atrial fibrillation with RVR (HCC)   Chest tightness   Acute on chronic HFrEF (heart failure with reduced ejection fraction) (HCC)   Atrial fibrillation (HCC)   CKD (chronic kidney disease) stage 3, GFR 30-59 ml/min   Pressure injury of skin  [name] is  A 79 y.o f with persistent afib, combined heart failure, htn, hypothyroidism, barrett's esophagus who presented with dyspnea, chest pain. Being treated for acute hypoxic  respiratory failure and atrial fibrillation.   Acute HFrEF 2/2 Atrial Fibrillation (EF 35-40% per TTE 2/14) s/p failed cardioversion 2/16 Patient continues to be normotensive in afib. Amiodarone load followed by cardioversion 2/22.   -continue po lasix 40mg  qd  -continue amiodarone 400mg  qd  -continue metoprolol 25mg  q6hrs  -continue eliquis 2.5mg  bid  -continue I/o, daily weights  Probable Amyloidosis Given HFrEF, Afib, CKD, and anemia there is concern for amyloidosis.   -IFE, free light chains pending  Acute hypoxic respiratory distress  Pulmonary infiltrate  2 view chest xray 2/17 which showed progressive infiltrate in rul adjacent to minor fissure. V/Q with anterior segment rul consolidation and patient unable to tolerate ventilation portion of study.  CTA chest 2/19 showing diffuse bilateral consolidations and ground glass opacities.   Patient has been diuresed over the past few days and is euvolemic. She does not have any fevers, leukocytosis, or elevated pct level to suggest infectious process. CRP 12.5.  -concern for possible amiodarone toxicity given patient's long standing use. Consulted pulmonology for further assistance. To formally evaluate today 2/21.  AoCKD3 Stable at cr 1.25. Will continue to monitor.  -kcl 14meq tid  Iron deficiency anemia s/p IV Feraheme 2/18  -per cardiology iron polysaccharides 150 mg daily   Dispo: Anticipated discharge in approximately 2-3 day(s).   Teresa Mage, MD 11/29/2019, 6:21 AM

## 2019-11-30 ENCOUNTER — Encounter (HOSPITAL_COMMUNITY): Payer: Self-pay | Admitting: Student in an Organized Health Care Education/Training Program

## 2019-11-30 ENCOUNTER — Encounter (HOSPITAL_COMMUNITY)
Admission: EM | Disposition: A | Discharge status: Home Health | Payer: Self-pay | Source: Home / Self Care | Attending: Internal Medicine

## 2019-11-30 ENCOUNTER — Inpatient Hospital Stay (HOSPITAL_COMMUNITY): Payer: PPO | Admitting: Certified Registered Nurse Anesthetist

## 2019-11-30 DIAGNOSIS — N189 Chronic kidney disease, unspecified: Secondary | ICD-10-CM

## 2019-11-30 DIAGNOSIS — R918 Other nonspecific abnormal finding of lung field: Secondary | ICD-10-CM

## 2019-11-30 HISTORY — PX: CARDIOVERSION: SHX1299

## 2019-11-30 LAB — KAPPA/LAMBDA LIGHT CHAINS
Kappa free light chain: 51 mg/L — ABNORMAL HIGH (ref 3.3–19.4)
Kappa, lambda light chain ratio: 0.64 (ref 0.26–1.65)
Lambda free light chains: 80.1 mg/L — ABNORMAL HIGH (ref 5.7–26.3)

## 2019-11-30 LAB — MAGNESIUM: Magnesium: 1.8 mg/dL (ref 1.7–2.4)

## 2019-11-30 LAB — BASIC METABOLIC PANEL
Anion gap: 14 (ref 5–15)
BUN: 45 mg/dL — ABNORMAL HIGH (ref 8–23)
CO2: 25 mmol/L (ref 22–32)
Calcium: 9.4 mg/dL (ref 8.9–10.3)
Chloride: 96 mmol/L — ABNORMAL LOW (ref 98–111)
Creatinine, Ser: 1.28 mg/dL — ABNORMAL HIGH (ref 0.44–1.00)
GFR calc Af Amer: 46 mL/min — ABNORMAL LOW (ref >60)
GFR calc non Af Amer: 39 mL/min — ABNORMAL LOW (ref >60)
Glucose, Bld: 103 mg/dL — ABNORMAL HIGH (ref 70–99)
Potassium: 3.9 mmol/L (ref 3.5–5.1)
Sodium: 135 mmol/L (ref 135–145)

## 2019-11-30 SURGERY — CARDIOVERSION
Anesthesia: General

## 2019-11-30 MED ORDER — SODIUM CHLORIDE 0.9 % IV SOLN: INTRAVENOUS | Status: DC | PRN

## 2019-11-30 MED ORDER — EPHEDRINE SULFATE-NACL 50-0.9 MG/10ML-% IV SOSY
PREFILLED_SYRINGE | INTRAVENOUS | Status: DC | PRN
  Administered 2019-11-30: 5 mg via INTRAVENOUS
  Administered 2019-11-30 (×3): 10 mg via INTRAVENOUS

## 2019-11-30 MED ORDER — PROPOFOL 10 MG/ML IV BOLUS
INTRAVENOUS | Status: DC | PRN
  Administered 2019-11-30: 50 mg via INTRAVENOUS

## 2019-11-30 MED ORDER — LIDOCAINE 2% (20 MG/ML) 5 ML SYRINGE
INTRAMUSCULAR | Status: DC | PRN
  Administered 2019-11-30: 100 mg via INTRAVENOUS

## 2019-11-30 MED ORDER — FUROSEMIDE 40 MG PO TABS: 40.0000 mg | ORAL_TABLET | Freq: Two times a day (BID) | ORAL | Status: DC

## 2019-11-30 MED ORDER — MAGNESIUM SULFATE 2 GM/50ML IV SOLN
2.0000 g | Freq: Once | INTRAVENOUS | Status: AC
  Administered 2019-11-30: 2 g via INTRAVENOUS
  Filled 2019-11-30: qty 50

## 2019-11-30 MED ORDER — FUROSEMIDE 40 MG PO TABS
40.0000 mg | ORAL_TABLET | Freq: Two times a day (BID) | ORAL | Status: DC
  Administered 2019-12-01: 40 mg via ORAL
  Filled 2019-11-30: qty 1

## 2019-11-30 MED ORDER — FUROSEMIDE 10 MG/ML IJ SOLN
40.0000 mg | Freq: Once | INTRAMUSCULAR | Status: AC
  Administered 2019-11-30: 40 mg via INTRAVENOUS
  Filled 2019-11-30: qty 4

## 2019-11-30 MED ORDER — PHENYLEPHRINE 40 MCG/ML (10ML) SYRINGE FOR IV PUSH (FOR BLOOD PRESSURE SUPPORT)
PREFILLED_SYRINGE | INTRAVENOUS | Status: DC | PRN
  Administered 2019-11-30 (×2): 120 ug via INTRAVENOUS
  Administered 2019-11-30: 160 ug via INTRAVENOUS

## 2019-11-30 NOTE — Progress Notes (Signed)
PT Cancellation Note  Patient Details Name: Teresa York MRN: [id number] DOB: 13-Feb-1941   Cancelled Treatment:    Reason Eval/Treat Not Completed: Patient declined, no reason specified. Pt declined PT this afternoon due to ongoing fatigue following procedure this morning. RN also asked PT to hold due to soft BP this afternoon (100/48). PT will continue to follow as time/schedule allow.   Karma Ganja, PT, DPT   Acute Rehabilitation Department Pager #: [phone]   Otho Bellows 11/30/2019, 2:45 PM

## 2019-11-30 NOTE — Discharge Instructions (Addendum)
It was a pleasure taking care of you Teresa York.  You were admitted for shortness of breath and chest pain.  We found that your were in atrial fibrillation and you had extra fluid in your lungs.  We performed an ultrasound of your heart that showed decreased function, which causes fluid to back up in your lungs, leading to lung congestion and difficulty breathing.  You underwent two cardioversion treatments to try to get your heart back into normal, but this only worked very briefly. Subsequently, we also did a CT chest that showed areas of opacities.  Because you continued have difficulty breathing and required supplemental oxygen, we think there is a lung process that is occurring at the same time as heart failure and atrial fibrillation.  It is possible that you have developed a lung reaction to amiodarone, which was used to control your heart rate and rhythm, or that there is another cause of inflammation in your lungs.  The lung doctors performed a bronchoscopy that showed normal airways, and they collected fluid from your lungs.  We are waiting for lab results to come back from this procedure. You were started on prednisone to treat inflammation in your lungs.  We are sending you home with home health physical therapy and home health nursing aide orders.  Please follow up with your primary care doctor within a week, and keep your scheduled follow up appointments with the heart and lung doctors.    Information on my medicine - ELIQUIS (apixaban) Why was Eliquis prescribed for you? Eliquis was prescribed for you to reduce the risk of a blood clot forming that can cause a stroke if you have a medical condition called atrial fibrillation (a type of irregular heartbeat).  What do You need to know about Eliquis ? Take your Eliquis TWICE DAILY - one tablet in the morning and one tablet in the evening with or without food. If you have difficulty swallowing the tablet whole please discuss with your  pharmacist how to take the medication safely.  Take Eliquis exactly as prescribed by your doctor and DO NOT stop taking Eliquis without talking to the doctor who prescribed the medication.  Stopping may increase your risk of developing a stroke.  Refill your prescription before you run out.  After discharge, you should have regular check-up appointments with your healthcare provider that is prescribing your Eliquis.  In the future your dose may need to be changed if your kidney function or weight changes by a significant amount or as you get older.  What do you do if you miss a dose? If you miss a dose, take it as soon as you remember on the same day and resume taking twice daily.  Do not take more than one dose of ELIQUIS at the same time to make up a missed dose.  Important Safety Information A possible side effect of Eliquis is bleeding. You should call your healthcare provider right away if you experience any of the following: ? Bleeding from an injury or your nose that does not stop. ? Unusual colored urine (red or dark brown) or unusual colored stools (red or black). ? Unusual bruising for unknown reasons. ? A serious fall or if you hit your head (even if there is no bleeding).  Some medicines may interact with Eliquis and might increase your risk of bleeding or clotting while on Eliquis. To help avoid this, consult your healthcare provider or pharmacist prior to using any new prescription or non-prescription medications,  including herbals, vitamins, non-steroidal anti-inflammatory drugs (NSAIDs) and supplements.  This website has more information on Eliquis (apixaban): http://www.eliquis.com/eliquis/home

## 2019-11-30 NOTE — Consult Note (Signed)
NAME:  Teresa York, MRN:  [id number], DOB:  1941/06/26, LOS: 7 ADMISSION DATE:  11/22/2019, CONSULTATION DATE:  11/30/2019  REFERRING MD:  Oda Kilts, MD, CHIEF COMPLAINT:  "amiodarone toxicity?"   History of present illness   This is a an 79 year old woman with a past medical history of chronic atrial fibrillation, heart failure reduced ejection fraction, and thromboembolic stroke in 6759.  Both she and her husband note that she has been having chronic issues with dyspnea on exertion for probably the past 15 years.  At least the last 5 to 7 years she has been severely debilitated and does not walk around much and usually immediately needs to rest of the public.  She has been especially debilitated since her stroke in 2018.  She has a diagnosis of sleep apnea both central and obstructive but has never been able to tolerate CPAP.  She also has Barrett's esophagus and has chronic cough which is mostly only at nighttime and she sleeps on 3 pillows at home.  He also is on a PPI for GERD.  She has been on amiodarone since 2018 for refractory A. fib and has had multiple cardioversions.  She has also been on anticoagulation but has had difficulty with frequent falls and several significant adverse effects of bleeding.  He is currently on apixaban.  Is been here for the last week trying to get her heart rate under better control and has subsequently been diuresed about 5 L.  There is a plan for a DCCV on Monday 2/22, and her amiodarone dose has been increased.  Pulmonary is being consulted to see if the hypoxemia and CT changes she has could be explained by amiodarone pulmonary toxicity.  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   Consults:  PCCM, cardiology  Procedures:    Significant Diagnostic Tests:   2/19 CT angio chest Shows patchy groundglass opacities with lower lobe predominance of small right-sided pleural effusion.  She also has some reactive lymphadenopathy in the hila and mediastinum.  2/14 echocardiogram 1. Left ventricular ejection fraction, by estimation, is 35 to 40%. The  left ventricle has moderately decreased function. The left ventricle  demonstrates global hypokinesis. Left ventricular diastolic function could  not be evaluated.  2. Right ventricular systolic function is moderately reduced. The right  ventricular size is normal. There is mildly elevated pulmonary artery  systolic pressure. The estimated right ventricular systolic pressure is  16.3 mmHg.  3. Left atrial size was severely dilated.  4. Right atrial size was severely dilated.  5. The mitral valve is normal in structure and function. Mild to moderate  mitral valve regurgitation.  6. The aortic valve is tricuspid. Aortic valve regurgitation is moderate.  Mild to moderate aortic valve sclerosis/calcification is present, without  any evidence of aortic stenosis.  7. The inferior vena cava is normal in size with <50% respiratory  variability, suggesting right atrial pressure of 8 mmHg.   Micro Data:  2/14 Covid negative  Antimicrobials:   Subjective/24 hours:  Rapid response called overnight for shortness of breath and chest pain. She  was diuresed and had BM which improved symptoms. This morning, tolerated PT/OT without significant tachycardia. Continues to report shortness of breath. Also has cough and chest  congestion with minimal sputum production.  Objective   Blood pressure (!) 112/54, pulse 99, temperature (!) 97.4 F (36.3 C), temperature source Oral, resp. rate 20, height _0  (1.549 m), weight 56 kg, SpO2 97 %.        Intake/Output Summary (Last 24 hours) at 11/30/2019 0907 Last data filed at 11/30/2019 0843 Gross per 24 hour  Intake 600 ml  Output 1950 ml  Net -1350 ml   Filed Weights   11/29/19 0340 11/30/19 0100 11/30/19 0500  Weight: 57.4 kg 57 kg 56 kg   Physical Exam: General: Elderly, frail-appearing, no acute distress HENT: [place], AT, OP clear, MMM Eyes: EOMI, no scleral icterus Respiratory: Bibasilar crackles. No wheezing Cardiovascular: Mild tachycardia, irregular rate and rhythm, -M/R/G, no JVD GI: BS+, soft, nontender Extremities:-Edema,-tenderness Neuro: AAO x4, CNII-XII grossly intact Psych: Anxious mood, normal affect   Assessment & Plan:  Acute hypoxemic respiratory failure Abnormal CT chest, with diffuse groundglass opacities Atrial fibrillation on amiodarone  79 year old female with significant history of atrial fibrillation refractory to multiple cardioversion attempts, hx thromboembolic stroke likely secondary to atrial fibrillation on anticoagulation, Barrett's esophagus with chronic reflux and cough. Pulmonary consulted to consider amiodarone toxicity in setting of worsening respiratory failure.  Of note, she has been on amiodarone for about 2 years with recent increase as an inpatient due to refractory AFRVR. Her major risk factors for developing amiodarone pulmonary toxicity are her advanced age, and her duration of chronic use. Amiodarone toxicity can have an insidious onset and can occur even if a patient has been on this medication between two months to several years. If there is an alternate agent available for antiarrhythmics for her atrial fibrillation, it might be a good idea to switch to that. Patient is planned for DCCV today. Risk for returning  back into A. fib is quite high based on her untreated sleep apnea and previous history.    Her CT chest findings certainly could be consistent with amiodarone pulmonary toxicity since she has already been diuresed significantly during this hospital stay.  Re-discussed with her regarding bronchoscopy later this week with BAL at least 48 hours after cardioversion.  Patient wishes to proceed if she remains symptomatic after procedure  Pulmonary will follow.  Rodman Pickle, M.D. [facility] Pulmonary/Critical Care Medicine 11/30/2019 9:07 AM    Labs   CBC: Recent Labs  Lab 11/25/19 0444 11/26/19 0507 11/27/19 0455 11/28/19 0722 11/29/19 0407  WBC 11.2* 11.3* 13.0* 12.0* 10.0  NEUTROABS 8.6*  --   --   --   --   HGB 8.9* 9.1* 10.1* 9.5* 9.4*  HCT 30.4* 31.0* 34.2* 32.1* 32.3*  MCV 76.6* 75.2* 75.0* 75.2* 77.1*  PLT 303 305 320 320 237    Basic Metabolic Panel: Recent Labs  Lab 11/26/19 0507 11/27/19 0455 11/28/19 0722 11/29/19 0407 11/30/19 0245  NA 135 137 131* 133* 135  K 3.2* 3.8 3.3* 3.6 3.9  CL 98 96* 95* 96* 96*  CO2 _1 GLUCOSE 102* 80 109* 105* 103*  BUN 27* 24* 31* 39* 45*  CREATININE 1.57* 1.22* 1.30* 1.25* 1.28*  CALCIUM 9.3 9.5 9.2 9.2 9.4  MG  --  1.5* 2.1 2.0 1.8   GFR: Estimated Creatinine Clearance: 26.5 mL/min (A) (by C-G formula based on SCr of 1.28 mg/dL (H)). Recent  Labs  Lab 11/26/19 0507 11/27/19 0455 11/28/19 0722 11/29/19 0407  PROCALCITON 0.11  --   --   --   WBC 11.3* 13.0* 12.0* 10.0    Liver Function Tests: Recent Labs  Lab 11/24/19 2142  AST 31  ALT 16  ALKPHOS 79  BILITOT 1.1  PROT 7.1  ALBUMIN 3.0*   No results for input(s): LIPASE, AMYLASE in the last 168 hours. No results for input(s): AMMONIA in the last 168 hours.  ABG    Component Value Date/Time   PHART 7.474 (H) 11/24/2019 1754   PCO2ART 31.7 (L) 11/24/2019 1754   PO2ART 100 11/24/2019 1754   HCO3 23.1 11/24/2019 1754   TCO2 27 11/13/2017 2211    ACIDBASEDEF 0.1 11/24/2019 1754   O2SAT 98.2 11/24/2019 1754     Coagulation Profile: Recent Labs  Lab 11/24/19 1510 11/27/19 1952  INR 1.4* 1.7*    Cardiac Enzymes: No results for input(s): CKTOTAL, CKMB, CKMBINDEX, TROPONINI in the last 168 hours.  HbA1C: Hgb A1c MFr Bld  Date/Time Value Ref Range Status  11/14/2017 05:00 AM 4.8 4.8 - 5.6 % Final    Comment:    (NOTE) Pre diabetes:          5.7%-6.4% Diabetes:              >6.4% Glycemic control for   <7.0% adults with diabetes     CBG: Recent Labs  Lab 11/24/19 1728  GLUCAP 127*

## 2019-11-30 NOTE — Transfer of Care (Signed)
Immediate Anesthesia Transfer of Care Note  Patient: Teresa York  Procedure(s) Performed: CARDIOVERSION (N/A )  Patient Location: Endoscopy Unit  Anesthesia Type:General  Level of Consciousness: drowsy  Airway & Oxygen Therapy: Patient Spontanous Breathing  Post-op Assessment: Report given to RN and Post -op Vital signs reviewed and stable  Post vital signs: Reviewed and stable  Last Vitals:  Vitals Value Taken Time  BP 91/32 11/30/19 1040  Temp 36.7 C 11/30/19 1040  Pulse 50 11/30/19 1040  Resp 21 11/30/19 1040  SpO2 97 % 11/30/19 1040    Last Pain:  Vitals:   11/30/19 1040  TempSrc: Axillary  PainSc:          Complications: No apparent anesthesia complications

## 2019-11-30 NOTE — Interval H&P Note (Signed)
History and Physical Interval Note:  11/30/2019 9:57 AM  Teresa York  has presented today for surgery, with the diagnosis of afib.  The various methods of treatment have been discussed with the patient and family. After consideration of risks, benefits and other options for treatment, the patient has consented to  Procedure(s): CARDIOVERSION (N/A) as a surgical intervention.  The patient's history has been reviewed, patient examined, no change in status, stable for surgery.  I have reviewed the patient's chart and labs.  Questions were answered to the patient's satisfaction.     Jenkins Rouge

## 2019-11-30 NOTE — Progress Notes (Signed)
Electrophysiology Rounding Note  Patient Name: Teresa York Date of Encounter: 11/30/2019  Primary Cardiologist: Lauree Chandler, MD Electrophysiologist: Thompson Grayer, MD   Subjective   Asked to see again today with question of amiodarone toxicity with worsening SOB overnight and CT findings of bilateral consolidation and ground-glass opacities despite diuresis  The patient is doing well at this time, pending DCCV this am. She denies SOB at rest at this time, but remains SOB with any exertion.   Inpatient Medications    Scheduled Meds: . amiodarone  400 mg Oral Daily  . apixaban  2.5 mg Oral BID  . cholecalciferol  1,000 Units Oral Daily  . feeding supplement (ENSURE ENLIVE)  237 mL Oral QHS  . feeding supplement (PRO-STAT SUGAR FREE 64)  30 mL Oral BID  . FLUoxetine  40 mg Oral Daily  . furosemide  40 mg Oral BID  . iron polysaccharides  150 mg Oral Daily  . latanoprost  1 drop Both Eyes QHS  . levothyroxine  175 mcg Oral Q0600  . metoprolol tartrate  25 mg Oral Q6H  . multivitamin with minerals  1 tablet Oral Daily  . pantoprazole  80 mg Oral Daily  . potassium chloride  10 mEq Oral TID  . traZODone  75 mg Oral QHS   Continuous Infusions:  PRN Meds: acetaminophen **OR** acetaminophen, benzonatate, hydrocortisone cream, promethazine, senna-docusate   Vital Signs    Vitals:   11/29/19 1951 11/30/19 0100 11/30/19 0402 11/30/19 0500  BP: 120/61  (!) 112/54   Pulse: 92  99   Resp: 20  20   Temp: (!) 97.4 F (36.3 C)  (!) 97.4 F (36.3 C)   TempSrc: Oral  Oral   SpO2: 96%  97%   Weight:  57 kg  56 kg  Height:        Intake/Output Summary (Last 24 hours) at 11/30/2019 0903 Last data filed at 11/30/2019 0843 Gross per 24 hour  Intake 600 ml  Output 1950 ml  Net -1350 ml   Filed Weights   11/29/19 0340 11/30/19 0100 11/30/19 0500  Weight: 57.4 kg 57 kg 56 kg    Physical Exam    GEN- The patient is elderly appearing, alert and oriented x 3 today.     Head- normocephalic, atraumatic Eyes-  Sclera clear, conjunctiva pink Ears- hearing intact Oropharynx- clear Neck- supple, JVP ~9 cm Lungs- Diminished throughout, normal work of breathing Heart- Irregularly irregular rate and rhythm, no murmurs, rubs or gallops GI- soft, NT, ND, + BS Extremities- no clubbing, cyanosis, or edema Skin- no rash or lesion Psych- euthymic mood, full affect Neuro- strength and sensation are intact  Labs    CBC Recent Labs    11/28/19 0722 11/29/19 0407  WBC 12.0* 10.0  HGB 9.5* 9.4*  HCT 32.1* 32.3*  MCV 75.2* 77.1*  PLT 320 [id number]   Basic Metabolic Panel Recent Labs    11/29/19 0407 11/30/19 0245  NA 133* 135  K 3.6 3.9  CL 96* 96*  CO2 26 25  GLUCOSE 105* 103*  BUN 39* 45*  CREATININE 1.25* 1.28*  CALCIUM 9.2 9.4  MG 2.0 1.8   Liver Function Tests No results for input(s): AST, ALT, ALKPHOS, BILITOT, PROT, ALBUMIN in the last 72 hours. No results for input(s): LIPASE, AMYLASE in the last 72 hours. Cardiac Enzymes No results for input(s): CKTOTAL, CKMB, CKMBINDEX, TROPONINI in the last 72 hours.   Telemetry    Atrial fibrillation 90-100s (personally reviewed)  Radiology  DG Chest 1 View  Result Date: 11/29/2019 CLINICAL DATA:  Wheezing and dyspnea EXAM: CHEST  1 VIEW COMPARISON:  11/25/2019 chest radiograph. FINDINGS: Stable cardiomediastinal silhouette with mild cardiomegaly. No pneumothorax. No pleural effusion. Extensive patchy hazy opacities throughout both lungs, most prominent in the right greater than left mid lungs, slightly worsened. Surgical clips in the right axilla. Vertebroplasty material overlies upper thoracic vertebral bodies. IMPRESSION: Cardiomegaly. Extensive patchy hazy opacities throughout both lungs, most prominent in the right greater than left mid lungs, slightly worsened, compatible with multilobar pneumonia versus pulmonary edema. Electronically Signed   By: Ilona Sorrel M.D.   On: 11/29/2019 10:03     Patient Profile     Teresa York is a 79 y.o. 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  she was admitted for acute CHF in the setting AF and EP asked to see.  Assessment & Plan    1. Persistent AF, atypical flutter Continue eliquis for CHA2DS2VASC of at least 8   Remains on amiodarone, she has been on this chronically so doubt that her worsening SOB over the weekend is related to toxicity, but much ? related to CHF.   She has been on amiodarone since 2018 Plan for DCCV today.  Follow closely on Metoprolol. Previously stopped due to bradycardia Not an ablation candidate with age and LA size (4.9 cm) Would prefer to avoid device implant if possible.  K 3.9. Mg 1.8. Supp. On my exam she appears volume overloaded. Her SOB improved with IV lasix overnight.  Will repeat dose of 40 mg IV today. CT chest 11/27/19 showed bilateral consolidation and ground-glass opacities. Dr. Caryl Comes to discuss further.   2. Acute CHF Echo 11/22/2019 with LVEF 35-40%  Diuresing as above  3. ? Amiodarone pulmonary toxicity Pt will not be able to undergo bronchoscopy as suggested due to DCCV today. She would need to wait 4 weeks at the very least, with uninterrupted [place].   For questions or updates, please contact [place] Please consult [email or web address] for contact info under Cardiology/STEMI.  Signed, Shirley Friar, PA-C  11/30/2019, 9:03 AM

## 2019-11-30 NOTE — Progress Notes (Signed)
PT Cancellation Note  Patient Details Name: Teresa York MRN: [id number] DOB: Mar 04, 1941   Cancelled Treatment:    Reason Eval/Treat Not Completed: Patient at procedure or test/unavailable. Shortly after PT arrival, the MD and transport team arrived to take the pt to a procedure and asked PT to follow up at a later time. PT will continue to follow as time/schedule allows.   Karma Ganja, PT, DPT   Acute Rehabilitation Department Pager #: [phone]  Otho Bellows 11/30/2019, 9:57 AM

## 2019-11-30 NOTE — Anesthesia Preprocedure Evaluation (Signed)
Anesthesia Evaluation  Patient identified by MRN, date of birth, ID band Patient awake    Reviewed: Allergy & Precautions, NPO status , Patient's Chart, lab work & pertinent test results  Airway Mallampati: I  TM Distance: >3 FB Neck ROM: Full    Dental   Pulmonary    Pulmonary exam normal        Cardiovascular hypertension, Pt. on medications Normal cardiovascular exam     Neuro/Psych Anxiety Depression CVA    GI/Hepatic GERD  Medicated and Controlled,  Endo/Other    Renal/GU Renal InsufficiencyRenal disease     Musculoskeletal   Abdominal   Peds  Hematology   Anesthesia Other Findings   Reproductive/Obstetrics                             Anesthesia Physical Anesthesia Plan  ASA: III  Anesthesia Plan: General   Post-op Pain Management:    Induction: Intravenous  PONV Risk Score and Plan: Treatment may vary due to age or medical condition  Airway Management Planned: Mask  Additional Equipment:   Intra-op Plan:   Post-operative Plan:   Informed Consent: I have reviewed the patients History and Physical, chart, labs and discussed the procedure including the risks, benefits and alternatives for the proposed anesthesia with the patient or authorized representative who has indicated his/her understanding and acceptance.       Plan Discussed with: CRNA and Surgeon  Anesthesia Plan Comments:         Anesthesia Quick Evaluation

## 2019-11-30 NOTE — CV Procedure (Signed)
Anesthesia:  Diprovan/Lidocaine On Rx anticoagulation with no missed doses  DCC x 1 120 J  Converted from afib/flutter rate 80 to NSR rat e64 bpm RBBB  No immediate neurologic sequelae  Jenkins Rouge MD [facility]

## 2019-11-30 NOTE — Progress Notes (Addendum)
Subjective:  Yesterday evening, a rapid response was called because patient felt SOB and was hypoxic to 83% on 3L Teresa York.  O2 was increased to 5L HFNC. Given IV Lasix 40 mg once.    Interview this morning:  Patient was sitting upright in bed this morning and was confused about the time of day when we approached her.  Though she was easily oriented and was oriented to self, place, situation, and date. She denied SOB or CP.  She is breathing comfortably on 6 L [place].  Objective:  Vital signs in last 24 hours: Vitals:   11/29/19 1951 11/30/19 0100 11/30/19 0402 11/30/19 0500  BP: 120/61  (!) 112/54   Pulse: 92  99   Resp: 20  20   Temp: (!) 97.4 F (36.3 C)  (!) 97.4 F (36.3 C)   TempSrc: Oral  Oral   SpO2: 96%  97%   Weight:  57 kg  56 kg  Height:       Output:  2,150 (net neg 1,490)  Weights:  11/30/19 0500  56 kg  123.46 lbs  11/30/19 0100  57 kg  125.66 lbs  11/29/19 03:40:30  57.4 kg  126.5 lbs  11/28/19 04:41:01  57.5 kg  126.76 lbs  11/22/19 1457  61.6 kg  135.8 lbs    Physical Exam  Constitutional: She is oriented to person, place, and time. She has a sickly appearance. She appears ill.  HENT:  Head: Normocephalic and atraumatic.  Eyes: Conjunctivae are normal.  Cardiovascular: Normal rate. Exam reveals no friction rub.  No murmur heard. Irregularly irregular rhythm  Pulmonary/Chest: Effort normal and breath sounds normal. No respiratory distress. She has no wheezes.  Crackles present bilaterally up to mid-lung.    Abdominal: Soft. Bowel sounds are normal. She exhibits no distension. There is no abdominal tenderness.  Musculoskeletal:        General: No edema.  Neurological: She is alert and oriented to person, place, and time.  Skin: Skin is warm and dry.  Psychiatric: She has a normal mood and affect. Her behavior is normal. Judgment and thought content normal.  Alert and oriented x3    Labs: CBC Latest Ref Rng & Units 11/29/2019 11/28/2019 11/27/2019  WBC 4.0 -  10.5 K/uL 10.0 12.0(H) 13.0(H)  Hemoglobin 12.0 - 15.0 g/dL 9.4(L) 9.5(L) 10.1(L)  Hematocrit 36.0 - 46.0 % 32.3(L) 32.1(L) 34.2(L)  Platelets 150 - 400 K/uL 341 320 320   CMP Latest Ref Rng & Units 11/30/2019 11/29/2019 11/28/2019  Glucose 70 - 99 mg/dL 103(H) 105(H) 109(H)  BUN 8 - 23 mg/dL 45(H) 39(H) 31(H)  Creatinine 0.44 - 1.00 mg/dL 1.28(H) 1.25(H) 1.30(H)  Sodium 135 - 145 mmol/L 135 133(L) 131(L)  Potassium 3.5 - 5.1 mmol/L 3.9 3.6 3.3(L)  Chloride 98 - 111 mmol/L 96(L) 96(L) 95(L)  CO2 22 - 32 mmol/L _0 Calcium 8.9 - 10.3 mg/dL 9.4 9.2 9.2  Total Protein 6.5 - 8.1 g/dL - - -  Total Bilirubin 0.3 - 1.2 mg/dL - - -  Alkaline Phos 38 - 126 U/L - - -  AST 15 - 41 U/L - - -  ALT 0 - 44 U/L - - -   Mg 1.8   Imaging/studies: CXR 2/21 IMPRESSION: Cardiomegaly. Extensive patchy hazy opacities throughout both lungs, most prominent in the right greater than left mid lungs, slightly worsened, compatible with multilobar pneumonia versus pulmonary edema.  Assessment/Plan:  Principal Problem:   Acute HFrEF (heart failure with  reduced ejection fraction) (HCC) Active Problems:   Atrial fibrillation with RVR (HCC)   Chest tightness   Acute on chronic HFrEF (heart failure with reduced ejection fraction) (HCC)   Atrial fibrillation (HCC)   CKD (chronic kidney disease) stage 3, GFR 30-59 ml/min   Pressure injury of skin  Teresa York is  A 79 y.o f with persistent afib, combined heart failure, htn, hypothyroidism, barrett's esophagus who presented with dyspnea, chest pain. Being treated for acute hypoxic respiratory failure and atrial fibrillation.   Acute HFrEF 2/2 Atrial Fibrillation (EF 35-40% per TTE 2/14) s/p failed cardioversion 2/16 Patient continues to be normotensive in afib. Amiodarone load followed by cardioversion today 2/22.  CXR completed 2/21 and showed bilateral haxy opacities (R>L) compatible with multilobar pna vs pulmonary edema.  Responded well to Lasix last  night after rapid response called for SOB, now resting comfortably on 6 L Teresa York.  Pulmonary crackles present up to mid-lung fields.  Will continue to diurese BID. Seen by SLP on 2/21 found no overt oropharyngeal aspiration and was recommended that she continue on her current diet.  ST will follow up for therapeutic diet tolerance.    -continue po lasix 83m BID -continue amiodarone [id number]  -continue metoprolol 2573m6hrs.  Watch HR and BP with cardioversion today --hypotensive and bradycardic after last cardioversion.  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   -IFE, free light chains pending  Acute hypoxic respiratory distress  Pulmonary infiltrate  2 view chest xray 2/17 which showed progressive infiltrate in rul adjacent to minor fissure. V/Q with anterior segment rul consolidation and patient unable to tolerate ventilation portion of study.  CTA chest 2/19 showing diffuse bilateral consolidations and ground glass opacities. She does not have any fevers, leukocytosis, or elevated pct level to suggest infectious process. CRP 12.5.  Concern for possible amiodarone toxicity given patient's long standing use.  Seen by pulmonology yesterday.   -.  Appreciate pulmonology recs:  -  CT findings consistent with amiodarone pulmonary toxicity given she has been adequately diuresed  -  Consider bronchoscopy with BAL later this week  -  Consider switching to different antiarrythmic if possible   -  Consider palliative care c/s given multiple chronic diseases  AoCKD3 Stable at cr 1.28. Will continue to monitor.  K+ wnl today.    -KCl 10me75md  Iron deficiency anemia s/p IV Feraheme 2/18  -per cardiology iron polysaccharides 150 mg daily   Dispo: Anticipated discharge in approximately 2-3 day(s).   [employer] Student  11/30/2019, 6:09 AM

## 2019-11-30 NOTE — Anesthesia Postprocedure Evaluation (Signed)
Anesthesia Post Note  Patient: Teresa York  Procedure(s) Performed: CARDIOVERSION (N/A )     Patient location during evaluation: PACU Anesthesia Type: General Level of consciousness: awake and alert Pain management: pain level controlled Vital Signs Assessment: post-procedure vital signs reviewed and stable Respiratory status: spontaneous breathing, nonlabored ventilation, respiratory function stable and patient connected to nasal cannula oxygen Cardiovascular status: blood pressure returned to baseline and stable Postop Assessment: no apparent nausea or vomiting Anesthetic complications: no    Last Vitals:  Vitals:   11/30/19 1053 11/30/19 1058  BP: (!) 100/39 (!) 102/38  Pulse: (!) 59 (!) 57  Resp: (!) 24 (!) 29  Temp:    SpO2: 99% 98%    Last Pain:  Vitals:   11/30/19 1058  TempSrc:   PainSc: 0-No pain                 Trinda Harlacher DAVID

## 2019-11-30 NOTE — Progress Notes (Signed)
Occupational Therapy Treatment Patient Details Name: Teresa York MRN: [id number] DOB: 09/15/1941 Today's Date: 11/30/2019    History of present illness Pt is a 79 y.o. F with significant PMH of stroke, afib, CHF, who presents with SOB, elevated HR, and hypokalemia. Received IV lasix in ED. S/P cardioversion 2/16.    OT comments  Pt progressing to EOB activity for set-up with grooming; pt sit to stand with minA and taking steps to [facility]. Pt denying need to sit in recliner and unable to encourage pt to perform OOB ADL. Pt 2-3L O2 >90%. Pt would benefit from continued OT skilled services for ADL, mobility and safety in [place] setting. OT following.    Follow Up Recommendations  Home health OT;Supervision/Assistance - 24 hour    Equipment Recommendations  3 in 1 bedside commode    Recommendations for Other Services      Precautions / Restrictions Precautions Precautions: Fall Precaution Comments: watch HR and O2 Restrictions Weight Bearing Restrictions: No       Mobility Bed Mobility Overal bed mobility: Needs Assistance Bed Mobility: Supine to Sit     Supine to sit: Min assist Sit to supine: Supervision   General bed mobility comments: for trunk elevation due to weakness  Transfers Overall transfer level: Needs assistance Equipment used: Rolling walker (2 wheeled) Transfers: Sit to/from Stand Sit to Stand: Min assist         General transfer comment: minA for power up    Balance Overall balance assessment: Needs assistance Sitting-balance support: No upper extremity supported;Feet supported Sitting balance-Leahy Scale: Good     Standing balance support: Bilateral upper extremity supported;During functional activity Standing balance-Leahy Scale: Poor                             ADL either performed or assessed with clinical judgement   ADL Overall ADL's : Needs assistance/impaired     Grooming: Set up;Sitting Grooming Details (indicate cue type  and reason): Pt denying need to stand for task today                 Toilet Transfer: Min guard;Stand-pivot;BSC   Toileting- Clothing Manipulation and Hygiene: Minimal assistance;Cueing for safety;Cueing for sequencing;Sitting/lateral lean;Sit to/from stand       Functional mobility during ADLs: Minimal assistance;Rolling walker;Cueing for safety General ADL Comments: pt limited by decreased activity tolerance and generalized weakness     Vision       Perception     Praxis      Cognition Arousal/Alertness: Awake/alert Behavior During Therapy: WFL for tasks assessed/performed Overall Cognitive Status: Within Functional Limits for tasks assessed                                          Exercises     Shoulder Instructions       General Comments 2L O2 >93%    Pertinent Vitals/ Pain       Pain Assessment: Faces Faces Pain Scale: Hurts little more Pain Location: back  Pain Descriptors / Indicators: Discomfort Pain Intervention(s): Monitored during session  Home Living                                          Prior Functioning/Environment  Frequency  Min 2X/week        Progress Toward Goals  OT Goals(current goals can now be found in the care plan section)  Progress towards OT goals: Progressing toward goals  Acute Rehab OT Goals Patient Stated Goal: to feel better OT Goal Formulation: With patient Time For Goal Achievement: 12/09/19 Potential to Achieve Goals: Good ADL Goals Pt Will Perform Grooming: with modified independence;standing Pt Will Perform Lower Body Dressing: with modified independence;sit to/from stand Pt Will Transfer to Toilet: with modified independence;ambulating Pt Will Perform Toileting - Clothing Manipulation and hygiene: with modified independence;sit to/from stand Pt Will Perform Tub/Shower Transfer: Tub transfer;tub bench;ambulating;with modified independence;rolling  walker Additional ADL Goal #1: Pt will utilize 3 energy conservation techniques to optimize independence and safety during ADL routine with independence.  Plan Discharge plan remains appropriate    Co-evaluation                 AM-PAC OT "6 Clicks" Daily Activity     Outcome Measure   Help from another person eating meals?: None Help from another person taking care of personal grooming?: A Little Help from another person toileting, which includes using toliet, bedpan, or urinal?: A Little Help from another person bathing (including washing, rinsing, drying)?: A Little Help from another person to put on and taking off regular upper body clothing?: A Little Help from another person to put on and taking off regular lower body clothing?: A Little 6 Click Score: 19    End of Session Equipment Utilized During Treatment: Oxygen;Rolling walker  OT Visit Diagnosis: Unsteadiness on feet (R26.81);Pain;Muscle weakness (generalized) (M62.81) Pain - part of body: ("everywhere")   Activity Tolerance Patient limited by fatigue   Patient Left in bed;with call bell/phone within reach   Nurse Communication Mobility status        Time: [id number] OT Time Calculation (min): 36 min  Charges: OT General Charges $OT Visit: 1 Visit OT Treatments $Self Care/Home Management : 8-22 mins $Therapeutic Activity: 8-22 mins  Jefferey Pica, OTR/L Acute Rehabilitation Services Pager: [phone] Office: [phone]    Audie Pinto 11/30/2019, 5:20 PM

## 2019-12-01 ENCOUNTER — Inpatient Hospital Stay (HOSPITAL_COMMUNITY): Payer: PPO

## 2019-12-01 LAB — BASIC METABOLIC PANEL
Anion gap: 13 (ref 5–15)
BUN: 54 mg/dL — ABNORMAL HIGH (ref 8–23)
CO2: 24 mmol/L (ref 22–32)
Calcium: 9.4 mg/dL (ref 8.9–10.3)
Chloride: 97 mmol/L — ABNORMAL LOW (ref 98–111)
Creatinine, Ser: 1.56 mg/dL — ABNORMAL HIGH (ref 0.44–1.00)
GFR calc Af Amer: 36 mL/min — ABNORMAL LOW (ref >60)
GFR calc non Af Amer: 31 mL/min — ABNORMAL LOW (ref >60)
Glucose, Bld: 98 mg/dL (ref 70–99)
Potassium: 4.2 mmol/L (ref 3.5–5.1)
Sodium: 134 mmol/L — ABNORMAL LOW (ref 135–145)

## 2019-12-01 LAB — MAGNESIUM: Magnesium: 2.4 mg/dL (ref 1.7–2.4)

## 2019-12-01 MED ORDER — FUROSEMIDE 40 MG PO TABS
40.0000 mg | ORAL_TABLET | Freq: Two times a day (BID) | ORAL | Status: DC
  Administered 2019-12-02 – 2019-12-04 (×5): 40 mg via ORAL
  Filled 2019-12-01 (×6): qty 1

## 2019-12-01 MED ORDER — POTASSIUM CHLORIDE CRYS ER 10 MEQ PO TBCR
10.0000 meq | EXTENDED_RELEASE_TABLET | Freq: Every day | ORAL | Status: DC
  Administered 2019-12-02 – 2019-12-03 (×2): 10 meq via ORAL
  Filled 2019-12-01 (×2): qty 1

## 2019-12-01 MED ORDER — METOPROLOL TARTRATE 25 MG PO TABS
25.0000 mg | ORAL_TABLET | Freq: Two times a day (BID) | ORAL | Status: DC
  Administered 2019-12-01 – 2019-12-02 (×2): 25 mg via ORAL
  Filled 2019-12-01 (×2): qty 1

## 2019-12-01 MED ORDER — BUTAMBEN-TETRACAINE-BENZOCAINE 2-2-14 % EX AERO
1.0000 | INHALATION_SPRAY | Freq: Once | CUTANEOUS | Status: DC
  Filled 2019-12-01: qty 20

## 2019-12-01 MED ORDER — LIDOCAINE HCL URETHRAL/MUCOSAL 2 % EX GEL
1.0000 "application " | Freq: Once | CUTANEOUS | Status: DC
  Filled 2019-12-01: qty 20

## 2019-12-01 MED ORDER — PHENYLEPHRINE HCL 0.25 % NA SOLN
1.0000 | Freq: Four times a day (QID) | NASAL | Status: DC | PRN
  Filled 2019-12-01: qty 15

## 2019-12-01 NOTE — Progress Notes (Signed)
Nutrition Follow-up  RD working remotely.  DOCUMENTATION CODES:   Not applicable  INTERVENTION:   -Continue Ensure Enlive po to daily, each supplement provides 350 kcal and 20 grams of protein -Continue 30 ml Prostat BID, each supplement provides 100 kclas and 15 grams protein -Continue MVI with minerals daily -Continue Magic cup TID with meals, each supplement provides 290 kcal and 9 grams of protein  NUTRITION DIAGNOSIS:   Increased nutrient needs related to chronic illness(CHF) as evidenced by estimated needs.  Ongoing  GOAL:   Patient will meet greater than or equal to 90% of their needs  Progressing   MONITOR:   PO intake, Supplement acceptance, Labs, Weight trends, Skin, I & O's  REASON FOR ASSESSMENT:   Consult Assessment of nutrition requirement/status  ASSESSMENT:   Teresa York is an 79 yo female with hx of Barrett's esophagus without dysplasia, atrial fibrillation, combined systolic and diastolic CHF, HTN, hypothyroidism, stroke (2019) who presented to to the ED with worsening SOB and chest pain  2/22- s/p DCCV  Reviewed I/O's: +619 ml x 24 hours and -6.5 L since admission  UOP: 450 ml x 24 hours  Attempted to speak with pt over phone, however, no answer.   Per chart review, pt is weak and confused at times. Pulmonary following for possible amyloidosis and/or amiodarone toxicity.   Pt remains with poor appetite; noted meal completion 10-50%. Per MAR, pt is taking Ensure Enlive and Prostat supplements.  Labs reviewed: Na: 134.   Diet Order:   Diet Order            Diet Heart Room service appropriate? Yes; Fluid consistency: Thin  Diet effective now              EDUCATION NEEDS:   No education needs have been identified at this time  Skin:  Skin Assessment: Skin Integrity Issues: Skin Integrity Issues:: Stage I Stage I: rt foot  Last BM:  11/30/19  Height:   Ht Readings from Last 1 Encounters:  11/30/19 5\' 1"  (1.549 m)    Weight:    Wt Readings from Last 1 Encounters:  12/01/19 56.4 kg    Ideal Body Weight:  47.7 kg  BMI:  Body mass index is 23.49 kg/m.  Estimated Nutritional Needs:   Kcal:  1600-1800  Protein:  75-90 grams  Fluid:  > 1.6 L    Loistine Chance, RD, LDN, [place] Registered Dietitian II Certified Diabetes Care and Education Specialist Please refer to [facility] for RD and/or RD on-call/weekend/after hours pager

## 2019-12-01 NOTE — Progress Notes (Signed)
NAME:  Teresa York, MRN:  [id number], DOB:  [birth year], LOS: 8 ADMISSION DATE:  11/22/2019, CONSULTATION DATE:  12/01/2019  REFERRING MD:  Oda Kilts, MD, CHIEF COMPLAINT:  "amiodarone toxicity?"   History of present illness   This is a an 79 year old woman with a past medical history of chronic atrial fibrillation, heart failure reduced ejection fraction, and thromboembolic stroke in 4097.  Both she and her husband note that she has been having chronic issues with dyspnea on exertion for probably the past 15 years.  At least the last 5 to 7 years she has been severely debilitated and does not walk around much and usually immediately needs to rest of the public.  She has been especially debilitated since her stroke in 2018.  She has a diagnosis of sleep apnea both central and obstructive but has never been able to tolerate CPAP.  She also has Barrett's esophagus and has chronic cough which is mostly only at nighttime and she sleeps on 3 pillows at home.  He also is on a PPI for GERD.  She has been on amiodarone since 2018 for refractory A. fib and has had multiple cardioversions.  She has also been on anticoagulation but has had difficulty with frequent falls and several significant adverse effects of bleeding.  He is currently on apixaban.  Is been here for the last week trying to get her heart rate under better control and has subsequently been diuresed about 5 L.  There is a plan for a DCCV on Monday 2/22, and her amiodarone dose has been increased.  Pulmonary is being consulted to see if the hypoxemia and CT changes she has could be explained by amiodarone pulmonary toxicity.  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   Consults:  PCCM, cardiology  Procedures:    Significant Diagnostic Tests:   2/19 CT angio chest Shows patchy groundglass opacities with lower lobe predominance of small right-sided pleural effusion.  She also has some reactive lymphadenopathy in the hila and mediastinum.  2/14 echocardiogram 1. Left ventricular ejection fraction, by estimation, is 35 to 40%. The  left ventricle has moderately decreased function. The left ventricle  demonstrates global hypokinesis. Left ventricular diastolic function could  not be evaluated.  2. Right ventricular systolic function is moderately reduced. The right  ventricular size is normal. There is mildly elevated pulmonary artery  systolic pressure. The estimated right ventricular systolic pressure is  35.3 mmHg.  3. Left atrial size was severely dilated.  4. Right atrial size was severely dilated.  5. The mitral valve is normal in structure and function. Mild to moderate  mitral valve regurgitation.  6. The aortic valve is tricuspid. Aortic valve regurgitation is moderate.  Mild to moderate aortic valve sclerosis/calcification is present, without  any evidence of aortic stenosis.  7. The inferior vena cava is normal in size with <50% respiratory  variability, suggesting right atrial pressure of 8 mmHg.   Micro Data:  2/14 Covid negative  Antimicrobials:   Subjective/24 hours:  S/p cardioversion yesterday with return to NSR. Weaned to 2L  O2. However patient continues to report shortness of breath  Objective   Blood pressure (!) 108/39, pulse (!) 51, temperature 97.7 F (36.5 C), temperature source Oral, resp. rate 14,  height _0  (1.549 m), weight 56.4 kg, SpO2 96 %.        Intake/Output Summary (Last 24 hours) at 12/01/2019 1631 Last data filed at 12/01/2019 1500 Gross per 24 hour  Intake 720 ml  Output 450 ml  Net 270 ml   Filed Weights   11/30/19 0500 11/30/19 1013 12/01/19 0818  Weight: 56 kg 57.6 kg 56.4 kg   Physical Exam: General: Elderly, frail-appearing, no acute distress HENT: [place], AT, OP clear, MMM Eyes: EOMI, no scleral icterus Respiratory: Bibasilar crackles. No crackles, wheezing or rales Cardiovascular: RRR, -M/R/G, no JVD Extremities:-Edema,-tenderness Neuro: AAO x4, CNII-XII grossly intact Psych: Normal mood, normal affect  Assessment & Plan:  Acute hypoxemic respiratory failure Abnormal CT chest, with diffuse groundglass opacities Atrial fibrillation on amiodarone  79 year old female with significant history of atrial fibrillation refractory to multiple cardioversion attempts, hx thromboembolic stroke likely secondary to atrial fibrillation on anticoagulation, Barrett's esophagus with chronic reflux and cough. Pulmonary consulted to consider amiodarone toxicity in setting of worsening respiratory failure.  Of note, she has been on amiodarone for about 2 years with recent increase as an inpatient due to refractory AFRVR. Her major risk factors for developing amiodarone pulmonary toxicity are her advanced age, and her duration of chronic use. Amiodarone toxicity can have an insidious onset and can occur even if a patient has been on this medication between two months to several years. If there is an alternate agent available for antiarrhythmics for her atrial fibrillation, it might be a good idea to switch to that. Patient is planned for DCCV today. Risk for returning back into A. fib is quite high based on her untreated sleep apnea and previous history.    Her CT chest findings certainly could be consistent with amiodarone pulmonary toxicity since she has already been diuresed  significantly during this hospital stay. Discussed bronchoscopy again with patient and she wishes to proceed with procedure as she does not feel cardioversion has not improved her dyspnea despite improved oxygenation and return to NSR. We discussed risks and benefits including high risk of redevelopment of AFRVR and worsening of respiratory status. She understands procedure is diagnostic and not therapeutic.   Plan Will plan for bronchoscopy with BAL tomorrow 2/24 @ 9 AM NPO at midnight OK to continue anticoagulation as no biopsy anticipated for procedure. Discussed plan with Cardiology. Obtain CXR Diuretic management per Cardiology  Rodman Pickle, M.D. [facility] Pulmonary/Critical Care Medicine 12/01/2019 4:43 PM   Labs   CBC: Recent Labs  Lab 11/25/19 0444 11/26/19 0507 11/27/19 0455 11/28/19 0722 11/29/19 0407  WBC 11.2* 11.3* 13.0* 12.0* 10.0  NEUTROABS 8.6*  --   --   --   --   HGB 8.9* 9.1* 10.1* 9.5* 9.4*  HCT 30.4* 31.0* 34.2* 32.1* 32.3*  MCV 76.6* 75.2* 75.0* 75.2* 77.1*  PLT 303 305 320 320 563    Basic Metabolic Panel: Recent Labs  Lab 11/27/19 0455 11/28/19 0722 11/29/19 0407 11/30/19 0245 12/01/19 0719  NA 137 131* 133* 135 134*  K 3.8 3.3* 3.6 3.9 4.2  CL 96* 95* 96* 96* 97*  CO2 _1 GLUCOSE 80 109* 105* 103* 98  BUN 24* 31* 39* 45* 54*  CREATININE 1.22* 1.30* 1.25* 1.28* 1.56*  CALCIUM 9.5 9.2 9.2 9.4  9.4  MG 1.5* 2.1 2.0 1.8 2.4   GFR: Estimated Creatinine Clearance: 21.7 mL/min (A) (by C-G formula based on SCr of 1.56 mg/dL (H)). Recent Labs  Lab 11/26/19 0507 11/27/19 0455 11/28/19 0722 11/29/19 0407  PROCALCITON 0.11  --   --   --   WBC 11.3* 13.0* 12.0* 10.0    Liver Function Tests: Recent Labs  Lab 11/24/19 2142  AST 31  ALT 16  ALKPHOS 79  BILITOT 1.1  PROT 7.1  ALBUMIN 3.0*   No results for input(s): LIPASE, AMYLASE in the last 168 hours. No results for input(s): AMMONIA in the last 168 hours.  ABG      Component Value Date/Time   PHART 7.474 (H) 11/24/2019 1754   PCO2ART 31.7 (L) 11/24/2019 1754   PO2ART 100 11/24/2019 1754   HCO3 23.1 11/24/2019 1754   TCO2 27 11/13/2017 2211   ACIDBASEDEF 0.1 11/24/2019 1754   O2SAT 98.2 11/24/2019 1754     Coagulation Profile: Recent Labs  Lab 11/27/19 1952  INR 1.7*    Cardiac Enzymes: No results for input(s): CKTOTAL, CKMB, CKMBINDEX, TROPONINI in the last 168 hours.  HbA1C: Hgb A1c MFr Bld  Date/Time Value Ref Range Status  11/14/2017 05:00 AM 4.8 4.8 - 5.6 % Final    Comment:    (NOTE) Pre diabetes:          5.7%-6.4% Diabetes:              >6.4% Glycemic control for   <7.0% adults with diabetes     CBG: Recent Labs  Lab 11/24/19 1728  GLUCAP 127*

## 2019-12-01 NOTE — Progress Notes (Signed)
PT Cancellation Note  Patient Details Name: Teresa York MRN: [id number] DOB: March 19, 1941   Cancelled Treatment:    Reason Eval/Treat Not Completed: Other (comment) Pt declined PT due to fatigue.  Reports she had a busy afternoon, fatigued, and just got laid back down and calm.  Nurse tech confirmed pt had just been up for ADLs and was very weak and fatigued.  Will follow up as able.  Maggie Font, PT Acute Rehab Services Pager [phone] [facility] [phone]  Karlton Lemon 12/01/2019, 4:20 PM

## 2019-12-01 NOTE — Care Management Important Message (Signed)
Important Message  Patient Details  Name: Teresa York MRN: [id number] Date of Birth: [birth year]   Medicare Important Message Given:  Yes     Shelda Altes 12/01/2019, 9:26 AM

## 2019-12-01 NOTE — Progress Notes (Signed)
Notified by CCMD that pt converted back into an irregular rhythm. HR increased to 90s from her baseline in the 31s. Pt asymptomatic. MD paged. Will continue monitoring.

## 2019-12-01 NOTE — Progress Notes (Signed)
Subjective:  Patient was sitting upright in chair during interview.  She continues to feel fatigued.  Endorses SOB, but denies CP.    Objective:  Vital signs in last 24 hours: Vitals:   11/30/19 1941 12/01/19 0349 12/01/19 0818 12/01/19 0824  BP: (!) 113/41 (!) 112/47  (!) 123/41  Pulse: 63 (!) 54  (!) 58  Resp: _0 Temp: 98.2 F (36.8 C) 98.1 F (36.7 C)    TempSrc: Oral Oral    SpO2:  100%    Weight:   56.4 kg   Height:       5 L [place] - 100%   Output:  451 (net +499)  Weights:  11/30/19 0500  56 kg  123.46 lbs  11/30/19 0100  57 kg  125.66 lbs  11/29/19 03:40:30  57.4 kg  126.5 lbs  11/28/19 04:41:01  57.5 kg  126.76 lbs  11/22/19 1457  61.6 kg  135.8 lbs    Physical Exam  Constitutional: She is oriented to person, place, and time. She has a sickly appearance. She appears ill.  HENT:  Head: Normocephalic and atraumatic.  Eyes: Conjunctivae are normal.  Cardiovascular: Regular rhythm. Exam reveals no friction rub.  No murmur heard. bradycardic  Pulmonary/Chest: Effort normal and breath sounds normal. No respiratory distress. She has no wheezes.  Crackles present bilaterally up to mid-lung.    Abdominal: Soft. Bowel sounds are normal. She exhibits no distension. There is no abdominal tenderness.  Musculoskeletal:        General: No edema.  Neurological: She is alert and oriented to person, place, and time.  Skin: Skin is warm and dry.  Psychiatric: She has a normal mood and affect. Her behavior is normal. Judgment and thought content normal.    Labs: CMP Latest Ref Rng & Units 12/01/2019 11/30/2019 11/29/2019  Glucose 70 - 99 mg/dL 98 103(H) 105(H)  BUN 8 - 23 mg/dL 54(H) 45(H) 39(H)  Creatinine 0.44 - 1.00 mg/dL 1.56(H) 1.28(H) 1.25(H)  Sodium 135 - 145 mmol/L 134(L) 135 133(L)  Potassium 3.5 - 5.1 mmol/L 4.2 3.9 3.6  Chloride 98 - 111 mmol/L 97(L) 96(L) 96(L)  CO2 22 - 32 mmol/L _1 Calcium 8.9 - 10.3 mg/dL 9.4 9.4 9.2  Total Protein 6.5 - 8.1  g/dL - - -  Total Bilirubin 0.3 - 1.2 mg/dL - - -  Alkaline Phos 38 - 126 U/L - - -  AST 15 - 41 U/L - - -  ALT 0 - 44 U/L - - -   Mg 2.4  Kappa free light chains: 51^ Lambda free light chains:  80 ^ Ratio:  0.64  Imaging/studies:  EKG 2/21:   Atrial flutter with variable A-V block, left axis deviation, RBBB, minimal voltage criteria for LVH, may be normal variant ( R in aVL ).  New T wave depression in V1 -- likely demand ischemia  Assessment/Plan:  Principal Problem:   Acute HFrEF (heart failure with reduced ejection fraction) (HCC) Active Problems:   Atrial fibrillation with RVR (HCC)   Chest tightness   Acute on chronic HFrEF (heart failure with reduced ejection fraction) (HCC)   Atrial fibrillation (HCC)   CKD (chronic kidney disease) stage 3, GFR 30-59 ml/min   Pressure injury of skin  [name] is  A 79 y.o f with persistent afib, combined heart failure, htn, hypothyroidism, barrett's esophagus who presented with dyspnea, chest pain. Being treated for acute hypoxic respiratory failure and atrial fibrillation.  Acute HFrEF 2/2 Atrial Fibrillation (EF 35-40% per TTE 2/14) s/p failed cardioversion 2/16  Amiodarone load followed by cardioversion 2/22 --> converted back to sinus rhythm and maintaining.  HR 90-100 prior to cardioversion, and now in the 50s.  Will monitor HR and BP --hypotensive and bradycardic after first cardioversion. Patient continues to have pulmonary crackles consistent with fluid overload, so will continue diuresis. -Cardiology appreciate recs:   -Decreased frequency of metoprolol from 80m q6h to BID -continue po lasix 471mBID -continue amiodarone 40048md (home dose amiodarone 100 mg QD) -continue eliquis 2.5mg20md  -continue I/o, daily weights -Not an ablation candidate with age and LA size (4.9 cm) -consider Multaq for alternative antiarrythmic  Possible Amyloidosis Given HFrEF, Afib, CKD, and anemia there is concern for amyloidosis. Kappa and  lambda light chains both elevated and ratio is wnl 0.64, possibly representative of renal dysfunction (cleared renally).  Still waiting on immunofixation results.    -  IFE pending  Acute hypoxic respiratory distress  Pulmonary infiltrate  2 view chest xray 2/17 which showed progressive infiltrate in rul adjacent to minor fissure. V/Q with anterior segment RUL consolidation and patient unable to tolerate ventilation portion of study.  CTA chest 2/19 showing diffuse bilateral consolidations and ground glass opacities. She does not have any fevers, leukocytosis, or elevated pct level to suggest infectious process. CRP 12.5.  Concern for possible amiodarone toxicity given patient's long standing use.  Consulted pulm and EP -- see recs below.  Per pulm, amiodarone toxicity can have an insidious onset and can occur even if a patient has been on this medication between two months to several years. -  Appreciate electrophysiology recs -- Following cardioversion, patient needs at least 4 weeks of OAC.[place] wait at least 4 weeks, with uninterrupted OAC,[place] -.  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  K+ and Mg+ wnl today.   -  Reduce KCl 10me50mD to QD  Iron deficiency anemia s/p IV Feraheme 2/18 -  per cardiology iron polysaccharides 150 mg daily  Deconditioning:   Appreciate PT/OT -  OT rec -- HH OT -- 24 hr supervision/assistance.  Recommends 3 in 1 bedside commode. -  PT rec -- HH PT -- 24 hr supervision/assistance   Dispo: Anticipated discharge in approximately 2-3 day(s).   [place] Blamerical  Student 12/01/2019, 10:38 AM

## 2019-12-01 NOTE — Progress Notes (Addendum)
Speech Language Pathology Treatment: Dysphagia  Patient Details Name: Teresa York MRN: [id number] DOB: 1941/08/31 Today's Date: 12/01/2019 Time: [id number] SLP Time Calculation (min) (ACUTE ONLY): 13 min  Assessment / Plan / Recommendation Clinical Impression  Pt seen at bedside for skilled ST. Patient alert, but reported she was feeling quite fatigued. Patient recently getting CXR, results not yet available at time of this session. Pt on 2L oxygen via [place]. Pt reports she was "coughing earlier," when asked to elucidate, she said "just coughing on life."  Pt seen with ice chip: no overt s/sx aspiration. Pt seen with straw sips of thin liquid (water) via straw: spontaneous double swallow noted and throat clear x1, no other s/sx aspiration. Pt seen w/ regular solids (graham cracker): moderately prolonged mastication and bolus formation prior to initiation of the swallow. 1 throat clear, good oral clearance, no other overt s/sx aspiration. Patient reports "it just takes me so long to chew, I get tired." ST initiated d/w patient re: diet textures, patient reports she eats "softer foods" at home because it takes her a long time to chew.  ST discussed dysphagia 3 diet textures, patient stating she would like to try it. Recommend dysphagia 3 solids/thin liquids. RN educated and verbally agreed. ST to follow briefly for diet tolerance.     HPI HPI: 79 year old person living with atrial fibrillation complicated by a stroke in 2019 presented to the emergency department with dyspnea and chest pain and admitted to the internal medicine service for acute heart failure with reduced ejection fraction.  On exam this morning the patient is still having mild chest pain/pressure at the mid sternum which radiates to her back.  Also still experiencing dyspnea at rest.  On exam she has a distended jugular vein to 7 cm above the sternum, consistent with elevated right atrial pressures to about 12.  She has 1+ pitting edema in  both of her lower extremities, lungs are clear with no crackles.  High sensitivity troponin levels have been normal.  EKG shows atrial fibrillation and right bundle branch block with no ischemic changes.  Cardiac ultrasound shows moderately reduced left ventricular systolic function, mild mitral regurgitation, severely dilated left atrium, moderately reduced right ventricular systolic function with estimated elevated pulmonary pressures.  Etiology for the acutely reduced left ventricular function could be tachycardia induced from atrial fibrillation.  It does not look like she has had an ischemic evaluation in the past, given ongoing chest pressure we will consult with cardiology to see if we should pursue an ischemic evaluation during this admission.  I agree with diuresis with IV Lasix, she is responding well so far to 40 mg IV, would dose twice daily.  She has an unknown dry weight as she has been losing significant amount of weight over the last year.  She had an elevation in her creatinine overnight from 1.0 to 1.4 in the setting of diuresis, but I do not think that she is hypovolemic today.  Would continue to diurese but monitor closely, hold on initiating spironolactone until renal function stabilizes.  For the atrial fibrillation with rapid ventricular response, she is on amiodarone 100 mg daily and apixaban 5 mg twice daily.  Probably okay to start low-dose beta-blocker, but will consult with cardiology first in case they want to do a pharmacologic stress test.        SLP Plan  Continue with current plan of care       Recommendations  Diet recommendations: Dysphagia 3 (mechanical soft);Thin liquid Medication  Administration: Whole meds with liquid Supervision: Patient able to self feed Compensations: Slow rate;Small sips/bites Postural Changes and/or Swallow Maneuvers: Seated upright 90 degrees                Oral Care Recommendations: Oral care BID SLP Visit Diagnosis: Dysphagia,  unspecified (R13.10) Plan: Continue with current plan of care       [place] 12/01/2019, 5:31 PM  Marina Goodell, M.Ed., [facility] [phone]: Acute Rehab office [phone] - pager

## 2019-12-01 NOTE — Progress Notes (Addendum)
Electrophysiology Rounding Note  Patient Name: Teresa York Date of Encounter: 12/01/2019  Primary Cardiologist: Lauree Chandler, MD Electrophysiologist: Thompson Grayer, MD   Subjective   Feeling somewhat better this am in NSR.   She remains mildly SOB with exertion (such as transferring from bed to chair today). OT recommending HH.    Inpatient Medications    Scheduled Meds: . amiodarone  400 mg Oral Daily  . apixaban  2.5 mg Oral BID  . cholecalciferol  1,000 Units Oral Daily  . feeding supplement (ENSURE ENLIVE)  237 mL Oral QHS  . feeding supplement (PRO-STAT SUGAR FREE 64)  30 mL Oral BID  . FLUoxetine  40 mg Oral Daily  . furosemide  40 mg Oral BID  . iron polysaccharides  150 mg Oral Daily  . latanoprost  1 drop Both Eyes QHS  . levothyroxine  175 mcg Oral Q0600  . metoprolol tartrate  25 mg Oral Q6H  . multivitamin with minerals  1 tablet Oral Daily  . pantoprazole  80 mg Oral Daily  . potassium chloride  10 mEq Oral TID  . traZODone  75 mg Oral QHS   Continuous Infusions:  PRN Meds: acetaminophen **OR** acetaminophen, benzonatate, hydrocortisone cream, promethazine, senna-docusate   Vital Signs    Vitals:   11/30/19 1941 12/01/19 0349 12/01/19 0818 12/01/19 0824  BP: (!) 113/41 (!) 112/47  (!) 123/41  Pulse: 63 (!) 54  (!) 58  Resp: 18 18  18   Temp: 98.2 F (36.8 C) 98.1 F (36.7 C)    TempSrc: Oral Oral    SpO2:  100%    Weight:   56.4 kg   Height:        Intake/Output Summary (Last 24 hours) at 12/01/2019 1019 Last data filed at 12/01/2019 0700 Gross per 24 hour  Intake 1070 ml  Output 451 ml  Net 619 ml   Filed Weights   11/30/19 0500 11/30/19 1013 12/01/19 0818  Weight: 56 kg 57.6 kg 56.4 kg    Physical Exam    GEN- The patient is elderly appearing, alert and oriented x 3 today.   Head- normocephalic, atraumatic Eyes-  Sclera clear, conjunctiva pink Ears- hearing intact Oropharynx- clear Neck- supple Lungs- Clear to ausculation  bilaterally, normal work of breathing Heart- Regular rate and rhythm, no murmurs, rubs or gallops GI- soft, NT, ND, + BS Extremities- no clubbing, cyanosis, or edema Skin- no rash or lesion Psych- euthymic mood, full affect Neuro- strength and sensation are intact  Labs    CBC Recent Labs    11/29/19 0407  WBC 10.0  HGB 9.4*  HCT 32.3*  MCV 77.1*  PLT [id number]   Basic Metabolic Panel Recent Labs    11/30/19 0245 12/01/19 0719  NA 135 134*  K 3.9 4.2  CL 96* 97*  CO2 25 24  GLUCOSE 103* 98  BUN 45* 54*  CREATININE 1.28* 1.56*  CALCIUM 9.4 9.4  MG 1.8 2.4   Liver Function Tests No results for input(s): AST, ALT, ALKPHOS, BILITOT, PROT, ALBUMIN in the last 72 hours. No results for input(s): LIPASE, AMYLASE in the last 72 hours. Cardiac Enzymes No results for input(s): CKTOTAL, CKMB, CKMBINDEX, TROPONINI in the last 72 hours.   Telemetry    Sinus brady/NSR 50-60s (personally reviewed)  Radiology    No results found.  Patient Profile     Teresa York is a 79 y.o. female with a past medical history significant for HTN, prior stroke, barretts esophagus, GERD, chronic  back pain,breast ca (L, s/p lumpectomy), hypothyroidism, OSA w/CPAP, NICM (suspect 2/2 to RVR), chronic CHF (systolic >> diastolic), with recovered LVEF, and persistent AFib.  she was admitted for acute CHF in the setting AF and EP asked to see.  Assessment & Plan    1. Persistent AF, atypical flutter Continue eliquis for CHA2DS2VASC of at least 8   Continue amiodarone 400 mg daily for now. (Has been on some dosage of amio since 2018) Will discuss further with Dr. Rayann Heman. She is maintaining NSR s/p DCCV 2/22.  Follow HRs on lopressor 25 mg, currently ordered for qid.  Not an ablation candidate with age and LA size (4.9 cm) Would prefer to avoid device implant if possible.  Mild AKI with IV lasix yesterday. To po today.   2. Acute CHF Echo 11/22/2019 with LVEF 35-40% Volume status improved. Mild  AKI Now in NSR. Back to po lasix at 40 mg BID.   3. ? Amiodarone pulmonary toxicity CT chest 11/27/19 showed bilateral consolidation and ground-glass opacities, raising concern for amio toxicity as contributing factor to her dyspnea.  Would not be bronch candidate until at least 4 weeks of uninterrupted [place] s/p DCCV 2/22.  4. AKI Cr 1.2 -> 1.5.  Diuretics back to po today.   For questions or updates, please contact [place] Please consult [email or web address] for contact info under Cardiology/STEMI.  Signed, Shirley Friar, PA-C  12/01/2019, 10:19 AM    I have seen, examined the patient, and reviewed the above assessment and plan.  Changes to above are made where necessary.  On exam, she is elderly and frail, RRR. We will need to follow closely on amiodarone.  Reduce metoprolol to 25mg  BID.  Repeat CXR after diuresis.  Co Sign: Thompson Grayer, MD 12/01/2019 3:46 PM

## 2019-12-02 ENCOUNTER — Encounter (HOSPITAL_COMMUNITY): Payer: Self-pay | Admitting: Student in an Organized Health Care Education/Training Program

## 2019-12-02 ENCOUNTER — Inpatient Hospital Stay (HOSPITAL_COMMUNITY): Payer: PPO | Admitting: Certified Registered"

## 2019-12-02 ENCOUNTER — Encounter (HOSPITAL_COMMUNITY)
Admission: EM | Disposition: A | Discharge status: Home Health | Payer: Self-pay | Source: Home / Self Care | Attending: Internal Medicine

## 2019-12-02 HISTORY — PX: BRONCHIAL WASHINGS: SHX5105

## 2019-12-02 HISTORY — PX: VIDEO BRONCHOSCOPY: SHX5072

## 2019-12-02 LAB — IMMUNOFIXATION ELECTROPHORESIS
IgA: <5 mg/dL — ABNORMAL LOW (ref 64–422)
IgG (Immunoglobin G), Serum: 1832 mg/dL — ABNORMAL HIGH (ref 586–1602)
IgM (Immunoglobulin M), Srm: 80 mg/dL (ref 26–217)
Total Protein ELP: 6.6 g/dL (ref 6.0–8.5)

## 2019-12-02 LAB — BASIC METABOLIC PANEL
Anion gap: 9 (ref 5–15)
BUN: 47 mg/dL — ABNORMAL HIGH (ref 8–23)
CO2: 28 mmol/L (ref 22–32)
Calcium: 9.4 mg/dL (ref 8.9–10.3)
Chloride: 101 mmol/L (ref 98–111)
Creatinine, Ser: 1.4 mg/dL — ABNORMAL HIGH (ref 0.44–1.00)
GFR calc Af Amer: 41 mL/min — ABNORMAL LOW (ref >60)
GFR calc non Af Amer: 35 mL/min — ABNORMAL LOW (ref >60)
Glucose, Bld: 94 mg/dL (ref 70–99)
Potassium: 3.8 mmol/L (ref 3.5–5.1)
Sodium: 138 mmol/L (ref 135–145)

## 2019-12-02 LAB — MAGNESIUM: Magnesium: 1.9 mg/dL (ref 1.7–2.4)

## 2019-12-02 SURGERY — VIDEO BRONCHOSCOPY WITHOUT FLUORO
Anesthesia: General

## 2019-12-02 MED ORDER — METOPROLOL TARTRATE 25 MG PO TABS
25.0000 mg | ORAL_TABLET | Freq: Four times a day (QID) | ORAL | Status: DC
  Administered 2019-12-02 (×2): 25 mg via ORAL
  Filled 2019-12-02 (×2): qty 1

## 2019-12-02 MED ORDER — LIDOCAINE HCL (PF) 1 % IJ SOLN
INTRAMUSCULAR | Status: DC | PRN
  Administered 2019-12-02: 5 mL

## 2019-12-02 MED ORDER — LIDOCAINE HCL (PF) 1 % IJ SOLN
INTRAMUSCULAR | Status: AC
  Filled 2019-12-02: qty 30

## 2019-12-02 MED ORDER — LIDOCAINE HCL 1 % IJ SOLN
INTRAMUSCULAR | Status: DC | PRN
  Administered 2019-12-02: 5 mL

## 2019-12-02 MED ORDER — LIDOCAINE 2% (20 MG/ML) 5 ML SYRINGE
INTRAMUSCULAR | Status: DC | PRN
  Administered 2019-12-02: 100 mg via INTRAVENOUS

## 2019-12-02 MED ORDER — EPHEDRINE SULFATE-NACL 50-0.9 MG/10ML-% IV SOSY
PREFILLED_SYRINGE | INTRAVENOUS | Status: DC | PRN
  Administered 2019-12-02: 10 mg via INTRAVENOUS

## 2019-12-02 MED ORDER — LACTATED RINGERS IV SOLN: INTRAVENOUS | Status: DC

## 2019-12-02 MED ORDER — PROPOFOL 10 MG/ML IV BOLUS
INTRAVENOUS | Status: DC | PRN
  Administered 2019-12-02: 40 mg via INTRAVENOUS
  Administered 2019-12-02: 100 mg via INTRAVENOUS

## 2019-12-02 MED ORDER — FENTANYL CITRATE (PF) 250 MCG/5ML IJ SOLN
INTRAMUSCULAR | Status: DC | PRN
  Administered 2019-12-02: 50 ug via INTRAVENOUS

## 2019-12-02 MED ORDER — PHENYLEPHRINE 40 MCG/ML (10ML) SYRINGE FOR IV PUSH (FOR BLOOD PRESSURE SUPPORT)
PREFILLED_SYRINGE | INTRAVENOUS | Status: DC | PRN
  Administered 2019-12-02: 120 ug via INTRAVENOUS
  Administered 2019-12-02: 160 ug via INTRAVENOUS
  Administered 2019-12-02: 120 ug via INTRAVENOUS

## 2019-12-02 MED ORDER — PROPOFOL 500 MG/50ML IV EMUL
INTRAVENOUS | Status: DC | PRN
  Administered 2019-12-02: 100 ug/kg/min via INTRAVENOUS

## 2019-12-02 NOTE — Anesthesia Postprocedure Evaluation (Signed)
Anesthesia Post Note  Patient: Teresa York  Procedure(s) Performed: VIDEO BRONCHOSCOPY WITHOUT FLUORO (N/A ) BRONCHIAL WASHINGS     Patient location during evaluation: PACU Anesthesia Type: General Level of consciousness: awake and alert Pain management: pain level controlled Vital Signs Assessment: post-procedure vital signs reviewed and stable Respiratory status: spontaneous breathing, nonlabored ventilation, respiratory function stable and patient connected to nasal cannula oxygen Cardiovascular status: blood pressure returned to baseline and stable Postop Assessment: no apparent nausea or vomiting Anesthetic complications: no    Last Vitals:  Vitals:   12/02/19 1030 12/02/19 1214  BP: (!) 120/58 (!) 121/52  Pulse: 88 89  Resp: (!) 22 20  Temp:  36.7 C  SpO2: 97% 90%    Last Pain:  Vitals:   12/02/19 1214  TempSrc: Oral  PainSc:                  Aengus Sauceda DAVID

## 2019-12-02 NOTE — Anesthesia Procedure Notes (Signed)
Procedure Name: LMA Insertion Date/Time: 12/02/2019 9:33 AM Performed by: Barrington Ellison, CRNA Pre-anesthesia Checklist: Patient identified, Emergency Drugs available, Suction available and Patient being monitored Patient Re-evaluated:Patient Re-evaluated prior to induction Oxygen Delivery Method: Circle System Utilized Preoxygenation: Pre-oxygenation with 100% oxygen Induction Type: IV induction Ventilation: Mask ventilation without difficulty LMA: LMA with gastric port inserted LMA Size: 4.0 Number of attempts: 1 Placement Confirmation: positive ETCO2 Tube secured with: Tape Dental Injury: Teeth and Oropharynx as per pre-operative assessment

## 2019-12-02 NOTE — Transfer of Care (Signed)
Immediate Anesthesia Transfer of Care Note  Patient: Teresa York  Procedure(s) Performed: VIDEO BRONCHOSCOPY WITHOUT FLUORO (N/A ) BRONCHIAL WASHINGS  Patient Location: Endoscopy Unit  Anesthesia Type:General  Level of Consciousness: drowsy and responds to stimulation  Airway & Oxygen Therapy: Patient Spontanous Breathing and Patient connected to nasal cannula oxygen  Post-op Assessment: Report given to RN  Post vital signs: Reviewed and stable  Last Vitals:  Vitals Value Taken Time  BP 133/101 12/02/19 1009  Temp    Pulse 92 12/02/19 1011  Resp 19 12/02/19 1011  SpO2 96 % 12/02/19 1011  Vitals shown include unvalidated device data.  Last Pain:  Vitals:   12/02/19 0840  TempSrc: Oral  PainSc: 0-No pain         Complications: No apparent anesthesia complications

## 2019-12-02 NOTE — Progress Notes (Signed)
Subjective:  Teresa York continues to feel tired today.  She denies racing heart or palpitations.  She states her goals are to be home and spend time with family.  She does not want to go to a SNF.  She has family and friends close by who can help her at home.  Her son and daughter live nearby, and her grandson can help, too.  She also thinks her friends from church can stay with her during the day, if needed.  She has a hard time keeping her eyes open and holding her head up because she's so sleepy -- nods off a couple times during the interview.    Objective:  Vital signs in last 24 hours: Vitals:   12/02/19 0840 12/02/19 1010 12/02/19 1020 12/02/19 1030  BP: (!) 119/54 (!) 133/101 (!) 106/44 (!) 120/58  Pulse: 79 89 67 88  Resp: (!) 21 20 (!) 22 (!) 22  Temp: 97.9 F (36.6 C) 97.7 F (36.5 C)    TempSrc: Oral Axillary    SpO2: 96% 94% 94% 97%  Weight:      Height:       2 L [place], 94%   Output:  1,100 (net -620)  Weights: 12/02/19   57.8 kg  127.4 lbs   12/01/19 0818  56.4 kg  124.3 lbs    11/22/19 1457  61.6 kg  135.8 lbs    Physical Exam  Constitutional: She is oriented to person, place, and time. She has a sickly appearance. She appears ill.  HENT:  Head: Normocephalic and atraumatic.  Eyes: Conjunctivae are normal.  Cardiovascular: Regular rhythm. Exam reveals no friction rub.  No murmur heard. bradycardic  Pulmonary/Chest: Effort normal and breath sounds normal. No respiratory distress. She has no wheezes.  Crackles present bilaterally up to mid-lung.    Abdominal: Soft. Bowel sounds are normal. She exhibits no distension. There is no abdominal tenderness.  Musculoskeletal:        General: No edema.  Neurological: She is alert and oriented to person, place, and time.  Skin: Skin is warm and dry.  Psychiatric: She has a normal mood and affect. Her behavior is normal. Judgment and thought content normal.    Labs: CMP Latest Ref Rng & Units 12/02/2019 12/01/2019  11/30/2019  Glucose 70 - 99 mg/dL 94 98 103(H)  BUN 8 - 23 mg/dL 47(H) 54(H) 45(H)  Creatinine 0.44 - 1.00 mg/dL 1.40(H) 1.56(H) 1.28(H)  Sodium 135 - 145 mmol/L 138 134(L) 135  Potassium 3.5 - 5.1 mmol/L 3.8 4.2 3.9  Chloride 98 - 111 mmol/L 101 97(L) 96(L)  CO2 22 - 32 mmol/L _0 Calcium 8.9 - 10.3 mg/dL 9.4 9.4 9.4  Total Protein 6.5 - 8.1 g/dL - - -  Total Bilirubin 0.3 - 1.2 mg/dL - - -  Alkaline Phos 38 - 126 U/L - - -  AST 15 - 41 U/L - - -  ALT 0 - 44 U/L - - -   Mg 1.9  Imaging/studies:  CXR 2/23:  FINDINGS: Left axillary surgical clips. Moderate cardiomegaly. Atherosclerosis in the transverse aorta. No pleural effusion or pneumothorax. Relatively diffuse interstitial opacities, progressive. Most significant increase in the left perihilar and right infrahilar regions. Osteopenia with upper thoracic vertebral augmentation.  IMPRESSION: Worsened bilateral interstitial opacities, pulmonary edema versus atypical infection.  Bronchoscopy and BAL 2/24 Findings:  Oropharynx, esophagus, vocal cords, subglottic space, trachea all appear normal.  [place]ina is sharp.  The tracheobronchial tree was examined to  at least the first subsegmental level.  Bronchial mucosa and antomy are normal; there are no endobronchial lesions and no secretions  BAL was performed ni the RML medial segment (b5) of th elung and sent for cell count, bacterial culture, viral smears and culture, and fungal & AFB analysis and cytology.  120 mL of fluid were instilled.  20 mL returned.  The return was cloudy.  There were no mucoid plugs in the return fluid.    Impression:  Bilateral infiltrate.  Airway examination was normal.    Assessment/Plan:  Principal Problem:   Acute HFrEF (heart failure with reduced ejection fraction) (HCC) Active Problems:   Atrial fibrillation with RVR (HCC)   Chest tightness   Acute on chronic HFrEF (heart failure with reduced ejection fraction) (HCC)   Atrial  fibrillation (HCC)   CKD (chronic kidney disease) stage 3, GFR 30-59 ml/min   Pressure injury of skin  Teresa York is  A 79 y.o f with persistent afib, combined heart failure, htn, hypothyroidism, barrett's esophagus who presented with dyspnea, chest pain. Being treated for acute hypoxic respiratory failure and atrial fibrillation.   Acute HFrEF 2/2 Atrial Fibrillation (EF 35-40% per TTE 2/14) s/p failed cardioversion 2/16  Amiodarone load followed by cardioversion 2/22 --> back to sinus rhythm for one day--> converted back to afib 2/23 10pm.  Reduced metoprolol from QID to BID yesterday, but since being back in afib HR increased back to 80s-90s.  Will stop amiodarone, as she has failed two cardioversions and there is concern for toxicity.  Will get CXR today to monitor progress of diuresis.  Currently 96% on 2L, improved from 5 L yesterday.  Crackles on lung exam and trace bilateral pitting edema -- will continue PO lasix BID. -Cardiology appreciate recs:  -STOP amiodarone  -continue metoprolol 638m BID -- titrate as needed for rate control. -continue po lasix 474mBID -continue eliquis 2.38m67mid  (CHA2DS2VASC of at least 8) -continue I/O, daily weights -Not an ablation candidate with age and LA size (4.9 cm) -could consider AV nodal ablation and pacing in the future; but would prefer to avoid if possible.   Possible Amyloidosis Given HFrEF, Afib, CKD, and anemia there is concern for amyloidosis. 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  Still waiting on immunofixation results.    -  IFE pending  Acute hypoxic respiratory distress  Pulmonary infiltrate  2 view chest xray 2/17 which showed progressive infiltrate in rul adjacent to minor fissure.  CTA chest 2/19 showing diffuse bilateral consolidations and ground glass opacities. She does not have any fevers, leukocytosis, or elevated pct level to suggest  infectious process. CRP 12.5.  Concern for possible amiodarone toxicity given patient's long standing use.  Consulted pulm and EP -- see recs below.  Per pulm, amiodarone toxicity can have an insidious onset and can occur even if a patient has been on this medication between two months to several years. -.  Appreciate pulmonology recs  -  Completed video bronch and BAL today-- Showed bilateral infiltrate, normal appearing airways , returned fluid was cloudy on BAL.  Awaiting BAL results-- cell count, bacterial culture, viral smears and culture, and fungal & AFB analysis and cytology  -  OK to continue anticoagulation as no biopsy anticipated. Discussed plan with Cardiology.  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  Responding well with good urine output.  K+ and Mg+ wnl today.   -  KCl 29mq QD  Iron deficiency anemia s/p IV Feraheme 2/18 -  per cardiology iron polysaccharides 150 mg daily  Deconditioning:   Discussed patient's wishes today for discharge (not ready, just pre-planning).  She wants to go home and believs she has enough support at home between family and church friends.  She does not wish to go to a SNF.  Most important thing is spending time with family.  Appreciate PT/OT.  Patient declined PT d/t fatigue.  -  OT rec -- HH OT -- 24 hr supervision/assistance.  Recommends 3 in 1 bedside commode. -  PT rec -- HH PT -- 24 hr supervision/assistance  Increased nutrient needs related to chronic illness(CHF)  Dysphagia, unspecified  Appreciate Nutrition recs and SLP recs -Continue Ensure Enlive poto daily, each supplement provides 350 kcal and 20 grams of protein -Continue 30 ml Prostat BID, each supplement provides 100 kclas and 15 grams  protein -Continue MVI with minerals daily -Continue Magic cup TID with meals, each supplement provides 290 kcal and 9 grams of protein -Dysphagia 3 (mechanical soft); thin liquid   Dispo: Anticipated discharge in approximately 2-3 day(s).   MJohny Blamer Medical Student 12/02/2019, 11:35 AM

## 2019-12-02 NOTE — Progress Notes (Addendum)
Electrophysiology Rounding Note  Patient Name: Teresa York Date of Encounter: 12/02/2019  Primary Cardiologist: Dr. [name] Electrophysiologist: Dr. Rayann Heman  Subjective   Pt is now awake from bronch this am. Husband present in room. She does not feel worse back in AF. She states she continues to feel better overall.   Inpatient Medications    Scheduled Meds: . apixaban  2.5 mg Oral BID  . cholecalciferol  1,000 Units Oral Daily  . feeding supplement (ENSURE ENLIVE)  237 mL Oral QHS  . feeding supplement (PRO-STAT SUGAR FREE 64)  30 mL Oral BID  . FLUoxetine  40 mg Oral Daily  . furosemide  40 mg Oral BID  . iron polysaccharides  150 mg Oral Daily  . latanoprost  1 drop Both Eyes QHS  . levothyroxine  175 mcg Oral Q0600  . metoprolol tartrate  25 mg Oral 4x daily  . multivitamin with minerals  1 tablet Oral Daily  . pantoprazole  80 mg Oral Daily  . potassium chloride  10 mEq Oral Daily  . traZODone  75 mg Oral QHS   Continuous Infusions:  PRN Meds: acetaminophen **OR** acetaminophen, benzonatate, hydrocortisone cream, promethazine, senna-docusate   Vital Signs    Vitals:   12/02/19 0840 12/02/19 1010 12/02/19 1020 12/02/19 1030  BP: (!) 119/54 (!) 133/101 (!) 106/44 (!) 120/58  Pulse: 79 89 67 88  Resp: (!) 21 20 (!) 22 (!) 22  Temp: 97.9 F (36.6 C) 97.7 F (36.5 C)    TempSrc: Oral Axillary    SpO2: 96% 94% 94% 97%  Weight:      Height:        Intake/Output Summary (Last 24 hours) at 12/02/2019 1203 Last data filed at 12/02/2019 1008 Gross per 24 hour  Intake 780 ml  Output 1100 ml  Net -320 ml   Filed Weights   11/30/19 1013 12/01/19 0818 12/02/19 0439  Weight: 57.6 kg 56.4 kg 57.8 kg    Physical Exam    GEN- The patient is elderly and fatigued appearing, alert and oriented x 3 today.   Head- normocephalic, atraumatic Eyes-  Sclera clear, conjunctiva pink Ears- hearing intact Oropharynx- clear Neck- supple Lungs- Clear to ausculation  bilaterally, normal work of breathing Heart- Irregularly irregular rate and rhythm, no murmurs, rubs or gallops GI- soft, NT, ND, + BS Extremities- no clubbing, cyanosis, or edema Skin- no rash or lesion Psych- euthymic mood, full affect Neuro- strength and sensation are intact  Labs    CBC No results for input(s): WBC, NEUTROABS, HGB, HCT, MCV, PLT in the last 72 hours. Basic Metabolic Panel Recent Labs    12/01/19 0719 12/02/19 0709  NA 134* 138  K 4.2 3.8  CL 97* 101  CO2 24 28  GLUCOSE 98 94  BUN 54* 47*  CREATININE 1.56* 1.40*  CALCIUM 9.4 9.4  MG 2.4 1.9   Liver Function Tests No results for input(s): AST, ALT, ALKPHOS, BILITOT, PROT, ALBUMIN in the last 72 hours. No results for input(s): LIPASE, AMYLASE in the last 72 hours. Cardiac Enzymes No results for input(s): CKTOTAL, CKMB, CKMBINDEX, TROPONINI in the last 72 hours. BNP Invalid input(s): POCBNP D-Dimer No results for input(s): DDIMER in the last 72 hours. Hemoglobin A1C No results for input(s): HGBA1C in the last 72 hours. Fasting Lipid Panel No results for input(s): CHOL, HDL, LDLCALC, TRIG, CHOLHDL, LDLDIRECT in the last 72 hours. Thyroid Function Tests No results for input(s): TSH, T4TOTAL, T3FREE, THYROIDAB in the last 72 hours.  Invalid input(s): FREET3  Telemetry    Back into AF with relatively controlled rates mostly in 80s (personally reviewed)  Radiology    DG CHEST PORT 1 VIEW  Result Date: 12/01/2019 CLINICAL DATA:  Heart failure.  Hypoxemia. EXAM: PORTABLE CHEST 1 VIEW COMPARISON:  11/29/2019 FINDINGS: Left axillary surgical clips. Moderate cardiomegaly. Atherosclerosis in the transverse aorta. No pleural effusion or pneumothorax. Relatively diffuse interstitial opacities, progressive. Most significant increase in the left perihilar and right infrahilar regions. Osteopenia with upper thoracic vertebral augmentation. IMPRESSION: Worsened bilateral interstitial opacities, pulmonary edema  versus atypical infection. Moderate cardiomegaly. Aortic Atherosclerosis (ICD10-I70.0). Electronically Signed   By: Abigail Miyamoto M.D.   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  Assessment & Plan    1. Persistent AF, atypical flutter Continue Eliquis for CHA2DS2VASC of at least 8   She has continued to fail DCCV so will stop amio for now, especially with concerns for toxicity.  Will pursue rate control.  If un-obtainable, could consider AV nodal ablation and pacing in the future; but would prefer to avoid if possible.  Continue lopressor 25 mg BID. Will plan to titrate as needed for rate control.  Not an AF ablation candidate with age and LA size (4.9 cm) Mild AKI with IV lasix yesterday. To po today.   2. Acute CHF Echo 11/22/2019 with LVEF 35-40% Volume status looks OK on po diuretics.  Cr trending back down.   3. ? Amiodarone pulmonary toxicity CT chest 11/27/19 showed bilateral consolidationand ground-glass opacities, raising concern for amio toxicity as contributing factor to her dyspnea.  Video bronchoscopy today with bilateral infiltrate, normal appearing airways and Bronchoalveolar lavage performed in the RML medial segment (B5) of the lung and sent for cell count, bacterial culture, viral smears & culture, and fungal & AFB analysis and cytology.   4. AKI 1.5 -> 1.4 Continue to follow on po diuretics.    For questions or updates, please contact [place] Please consult [email or web address] for contact info under Cardiology/STEMI.  Signed, Shirley Friar, PA-C  12/02/2019, 12:03 PM    I  have seen, examined the patient, and reviewed the above assessment and plan.  Changes to above are made where necessary.  On exam, iRRR.  She has returned to afib.  I think we may need to consider a rate control strategy going forward.  I will stop amiodarone as she has returned to afib. She is mostly rate controlled at this time.  We can further titrate metoprolol as needed.  Prognosis is guarded at best.  Discussed with the medicine team on rounds today.  Co Sign: Thompson Grayer, MD 12/02/2019

## 2019-12-02 NOTE — Op Note (Signed)
[facility] Cardiopulmonary Patient Name: Teresa York Date: 12/02/2019 MRN: [id number] Attending MD: Rodman Pickle , MD Date of Birth: 04/15/1941 CSN: Finalized Age: 79 Admit Type: Inpatient Gender: Female Procedure:             Bronchoscopy Indications:           Bilateral infiltrate Providers:             Rodman Pickle, MD, Glori Bickers, RN, Lazaro Arms,                         Technician Referring MD:           Medicines:             Lidocaine 1% applied to the tracheobronchial tree 10 mL Complications:         No immediate complications Estimated Blood Loss:  Estimated blood loss: none. Procedure:             Pre-Anesthesia Assessment:                        - A History and Physical has been performed. Patient                         meds and allergies have been reviewed. The risks and                         benefits of the procedure and the sedation options and                         risks were discussed with the patient. All questions                         were answered and informed consent was obtained.                         Patient identification and proposed procedure were                         verified prior to the procedure by the physician in                         the pre-procedure area. Mental Status Examination:                         alert and oriented. Airway Examination: normal                         oropharyngeal airway. Respiratory Examination:                         bibasilar crackles. CV Examination: normal. ASA Grade                         Assessment: III - A patient with severe systemic                         disease. After reviewing the risks and benefits, the  patient was deemed in satisfactory condition to                         undergo the procedure. The anesthesia plan was to use                         general anesthesia. Immediately prior to                         administration of  medications, the patient was                         re-assessed for adequacy to receive sedatives. The                         heart rate, respiratory rate, oxygen saturations,                         blood pressure, adequacy of pulmonary ventilation, and                         response to care were monitored throughout the                         procedure. The physical status of the patient was                         re-assessed after the procedure.                        After obtaining informed consent, the bronchoscope was                         passed under direct vision. Throughout the procedure,                         the patient's blood pressure, pulse, and oxygen                         saturations were monitored continuously. the [id number]                         ([id number]) Olympus Therapeutic Bronchoscope was                         introduced through the mouth and advanced to the                         tracheobronchial tree. The patient tolerated the                         procedure well. Scope In: Scope Out: Findings:      The oropharynx appears normal. The larynx appears normal. The vocal       cords appear normal. The subglottic space is normal. The trachea is of       normal caliber. The carina is sharp. The tracheobronchial tree was       examined to at least the first subsegmental level. Bronchial mucosa and       anatomy  are normal; there are no endobronchial lesions, and no       secretions.      Bronchoalveolar lavage was performed in the RML medial segment (B5) of       the lung and sent for cell count, bacterial culture, viral smears &       culture, and fungal & AFB analysis and cytology. 120 mL of fluid were       instilled. 20 mL were returned. The return was cloudy. There were no       mucoid plugs in the return fluid. Impression:            - Bilateral infiltrate                        - The airway examination was normal.                        -  Bronchoalveolar lavage was performed.                        - The airway examination was normal. Moderate Sedation:      Moderate (conscious) sedation was personally administered by an       anesthesia professional. The following parameters were monitored: oxygen       saturation, heart rate, blood pressure, and response to care. Recommendation:        - Await BAL results.                        - Return patient to hospital ward for ongoing care. Procedure Code(s):     --- Professional ---                        [phone], Bronchoscopy, rigid or flexible, including                         fluoroscopic guidance, when performed; with bronchial                         alveolar lavage Diagnosis Code(s):     --- Professional ---                        R91.8, Other nonspecific abnormal finding of lung field CPT copyright 2019 American Medical Association. All rights reserved. The codes documented in this report are preliminary and upon coder review may  be revised to meet current compliance requirements. Rodman Pickle, MD 12/02/2019 10:21:58 AM This report has been signed electronically. Number of Addenda: 0

## 2019-12-02 NOTE — Progress Notes (Signed)
PT Cancellation Note  Patient Details Name: Teresa York MRN: [id number] DOB: 05/05/1941   Cancelled Treatment:    Reason Eval/Treat Not Completed: Fatigue/lethargy limiting ability to participate Pt declined participating in therapy at this time due to fatigue. PT will continue to follow acutely.   Earney Navy, PTA Acute Rehabilitation Services Pager: [phone] Office: [phone]   12/02/2019, 3:09 PM

## 2019-12-02 NOTE — Anesthesia Preprocedure Evaluation (Signed)
Anesthesia Evaluation  Patient identified by MRN, date of birth, ID band Patient awake    Reviewed: Allergy & Precautions, NPO status , Patient's Chart, lab work & pertinent test results  Airway Mallampati: I  TM Distance: >3 FB Neck ROM: Full    Dental   Pulmonary sleep apnea ,    Pulmonary exam normal        Cardiovascular hypertension, Pt. on medications Normal cardiovascular exam     Neuro/Psych Anxiety Depression CVA    GI/Hepatic GERD  Medicated and Controlled,  Endo/Other    Renal/GU Renal InsufficiencyRenal disease     Musculoskeletal   Abdominal   Peds  Hematology   Anesthesia Other Findings   Reproductive/Obstetrics                             Anesthesia Physical Anesthesia Plan  ASA: III  Anesthesia Plan: General   Post-op Pain Management:    Induction: Intravenous  PONV Risk Score and Plan: 3 and Ondansetron and Treatment may vary due to age or medical condition  Airway Management Planned: LMA  Additional Equipment:   Intra-op Plan:   Post-operative Plan: Extubation in OR  Informed Consent: I have reviewed the patients History and Physical, chart, labs and discussed the procedure including the risks, benefits and alternatives for the proposed anesthesia with the patient or authorized representative who has indicated his/her understanding and acceptance.       Plan Discussed with: CRNA and Surgeon  Anesthesia Plan Comments:         Anesthesia Quick Evaluation

## 2019-12-03 ENCOUNTER — Telehealth: Payer: Self-pay | Admitting: Internal Medicine

## 2019-12-03 DIAGNOSIS — R1310 Dysphagia, unspecified: Secondary | ICD-10-CM

## 2019-12-03 DIAGNOSIS — I5023 Acute on chronic systolic (congestive) heart failure: Secondary | ICD-10-CM

## 2019-12-03 DIAGNOSIS — R634 Abnormal weight loss: Secondary | ICD-10-CM

## 2019-12-03 LAB — BODY FLUID CELL COUNT WITH DIFFERENTIAL
Eos, Fluid: 21 %
Lymphs, Fluid: 5 %
Monocyte-Macrophage-Serous Fluid: 49 % — ABNORMAL LOW (ref 50–90)
Neutrophil Count, Fluid: 25 % (ref 0–25)
Total Nucleated Cell Count, Fluid: 243 cu mm (ref 0–1000)

## 2019-12-03 LAB — ACID FAST SMEAR (AFB, MYCOBACTERIA): Acid Fast Smear: NEGATIVE

## 2019-12-03 LAB — CYTOLOGY - NON PAP

## 2019-12-03 MED ORDER — METOPROLOL TARTRATE 25 MG PO TABS
25.0000 mg | ORAL_TABLET | Freq: Two times a day (BID) | ORAL | Status: DC
  Administered 2019-12-03 – 2019-12-04 (×3): 25 mg via ORAL
  Filled 2019-12-03 (×3): qty 1

## 2019-12-03 MED ORDER — PREDNISONE 20 MG PO TABS
40.0000 mg | ORAL_TABLET | Freq: Every day | ORAL | Status: DC
  Administered 2019-12-04: 40 mg via ORAL
  Filled 2019-12-03: qty 2

## 2019-12-03 NOTE — Progress Notes (Signed)
Responded to  Spiritual Consult requesting prayer for Mrs. Bayer.  Checking in with staff learned Mrs. Rase had a hard day and Mr. Machnik was very upset.  At the time of my visit I found Mrs. Thacher to be calm and peaceful.  When she learned I am a Chaplain she gently smiled.  She and I held hands as I offered prayer at her bedside. After leaving Mrs. Eulis Foster I called Mr. Leitz and reported I had found his wife peaceful and resting.  [employer] of presence as he described the "frightful day" he had with her.  Listened as Mr. Mawson processed his feelings and concerns.  Mr. Lanteigne thinks his wife believes she is dying because her current condition is reminiscent of her mother's death which she witnessed years ago.  That would explain why she is so upset, he believes.   Affirmed and validated Mr. Wishard belief in [place] to whom he gives thanks for helping his wife settle down tonight.  He is hopeful she gets some rest tonight which she badly needs he says.  We talked about his need for rest tonight too.  Ended our time together with prayer.  Will pass the Cochrane's on to Unit Chaplain for  follow-up.  De [place]rs Chaplain Resident

## 2019-12-03 NOTE — Telephone Encounter (Signed)
According to documentation in chart from 12/03/2019 by Tomi Bamberger, RN - husband chose [facility]  and they have been contacted.  Please see that note for more information.  Also,  Called to spoke with husband who reports he was given paperwork to be signed by Dr Rayann Heman for pt to have assistance at home.  Husband states there is no way he will be able to take care of her by himself and needs assistance with bathing, ADLs etc.  Husband states he was informed insurance will not pay for assistance unless the paperwork is signed by a doctor.  Advised husband I will forward this information to Dr. Rayann Heman and his nurse for their review.

## 2019-12-03 NOTE — Progress Notes (Signed)
Physical Therapy Treatment Patient Details Name: Teresa York MRN: [id number] DOB: 05-15-1941 Today's Date: 12/03/2019    History of Present Illness Pt is a 79 y.o. F with significant PMH of stroke, afib, CHF, who presents with SOB, elevated HR, and hypokalemia. Received IV lasix in ED. S/P cardioversion 2/16, but now back in afib with plan to pursue rate control per notes.    PT Comments    Pt had been unable to participate with PT for several days due to refusals or procedures.  She was agreeable today and was able to ambulate 105' with RW and min A.  Pt did fatigue easily but O2 sats were stable on 4 LPM O2 and HR up to 105 bpm with activity.  After ambulation, too fatigued for stairs today. aPt's spouse very supportive and able to provide assist at home.  Pt has rollator, BSC, shower bench, and w/c at home.  Cont POC.  Try stairs if able (pt with 2 no handrails).    Follow Up Recommendations  Supervision/Assistance - 24 hour;Home health PT     Equipment Recommendations  None recommended by PT(has DME)    Recommendations for Other Services       Precautions / Restrictions Precautions Precautions: Fall Precaution Comments: watch HR and O2    Mobility  Bed Mobility Overal bed mobility: Needs Assistance Bed Mobility: Supine to Sit     Supine to sit: Min guard;HOB elevated     General bed mobility comments: increased time and use of bed rails  Transfers Overall transfer level: Needs assistance Equipment used: Rolling walker (2 wheeled)   Sit to Stand: Min guard         General transfer comment: sit to stand x 3 with cues for safe hand placement  Ambulation/Gait Ambulation/Gait assistance: Min assist Gait Distance (Feet): 105 Feet Assistive device: Rolling walker (2 wheeled) Gait Pattern/deviations: Step-through pattern;Decreased stride length;Trunk flexed Gait velocity: decreased   General Gait Details: Pt ambulated 105', sitting rest break, and then 20'.   Required assist for RW with turns and cues for posture.  After about 46' pt reporting significant fatigue and required increased guarding but no LOB.  After 105' reporting needs to sit down - HR 105, O2 sats 95%, DOE 2/4.  After rest break was able to complete walk.   Stairs             Wheelchair Mobility    Modified Rankin (Stroke Patients Only)       Balance Overall balance assessment: Needs assistance Sitting-balance support: No upper extremity supported;Feet supported Sitting balance-Leahy Scale: Good     Standing balance support: During functional activity;Single extremity supported Standing balance-Leahy Scale: Fair Standing balance comment: pt able to pull up Depends with single UE support and min guard                            Cognition Arousal/Alertness: Awake/alert Behavior During Therapy: WFL for tasks assessed/performed Overall Cognitive Status: Within Functional Limits for tasks assessed                                 General Comments: spouse present and encouraging activity      Exercises      General Comments General comments (skin integrity, edema, etc.): Pt on 4 LPM O2 with sats 92-95%.  HR 90's rest and up to 105 with walking.  Pertinent Vitals/Pain Pain Assessment: No/denies pain    Home Living                      Prior Function            PT Goals (current goals can now be found in the care plan section) Progress towards PT goals: Progressing toward goals    Frequency    Min 3X/week      PT Plan Current plan remains appropriate    Co-evaluation              AM-PAC PT "6 Clicks" Mobility   Outcome Measure  Help needed turning from your back to your side while in a flat bed without using bedrails?: None Help needed moving from lying on your back to sitting on the side of a flat bed without using bedrails?: A Little Help needed moving to and from a bed to a chair (including a  wheelchair)?: None Help needed standing up from a chair using your arms (e.g., wheelchair or bedside chair)?: None Help needed to walk in hospital room?: A Little Help needed climbing 3-5 steps with a railing? : A Little 6 Click Score: 21    End of Session Equipment Utilized During Treatment: Gait belt;Oxygen Activity Tolerance: Patient limited by fatigue Patient left: in chair;with call bell/phone within reach;with family/visitor present Nurse Communication: Mobility status PT Visit Diagnosis: Unsteadiness on feet (R26.81);Muscle weakness (generalized) (M62.81);Difficulty in walking, not elsewhere classified (R26.2)     Time: [id number] PT Time Calculation (min) (ACUTE ONLY): 29 min  Charges:  $Gait Training: 8-22 mins $Therapeutic Activity: 8-22 mins                     Maggie Font, PT Acute Rehab Services Pager [phone] [facility] [phone] [facility] [phone]    Karlton Lemon 12/03/2019, 11:39 AM

## 2019-12-03 NOTE — Progress Notes (Addendum)
SLP Cancellation Note  Patient Details Name: Teresa York MRN: [id number] DOB: Jul 17, 1941   Cancelled treatment:       Reason Eval/Treat Not Completed: Other (comment);Fatigue/lethargy limiting ability to participate Patient sleeping soundly when ST entered room. Husband present and reports patient has been "inconsolably crying" and distressed over past hour or two, felt best to let patient rest now. D/w RN: RN reports unsure if patient still planning to d/c home today.  ST will continue efforts.   Marina Goodell, M.Ed., CCC-SLP Speech Therapy Acute Rehabilitation [phone]: Acute Rehab office [phone] - pager   Marina Goodell 12/03/2019, 2:40 PM

## 2019-12-03 NOTE — Plan of Care (Signed)
  Problem: Activity: Goal: Risk for activity intolerance will decrease Outcome: Progressing   Problem: Safety: Goal: Ability to remain free from injury will improve Outcome: Progressing   

## 2019-12-03 NOTE — TOC Transition Note (Addendum)
Transition of Care [facility]) - CM/SW Discharge Note   Patient Details  Name: Teresa York MRN: [id number] Date of Birth: October 20, 1940  [facility] of [facility]) CM/SW Contact:  Zenon Mayo, RN Phone Number: 12/03/2019, 1:31 PM   Clinical Narrative:    NCM spoke with spouse in the room, offered choice for HHPT, [place], he chose [facility], said she likes to work with Tommi Rumps.  NCM made referral to [facility] with [facility], she states they can take referral. Soc will begin 24 to 48 hrs post dc.  NCM will fax information over to them.However, they can not provide the hhot, NCM informed spouse of this information he states just to go with the HHPT for now, because that's who patient wants for [facility] services. NCM informed MD to put order just for HHPT. NCM also gave spouse pcs form for aide services if he wants to go thru her pcp for that, but will have to have medicaid if he chooses that in the future. Spouse states she has all the DME she needs they do not need any DME.  Spouse also wanted to speak with MD, NCM notified MD of this information.   MD states to ask [facility] if they can do HHRN, HHAIDE and HHPT.  NCM contacted [facility] again and she states to send the orders over and she will see what she can do. NCM informed MD to put orders in.  2/26- NCM notified [facility] that patient is for possible dc today, will need HHPT/HHAIDE.  She will also need bsc, referral made to [facility] with [facility]. He will have the bsc brought to patient's room prior to dc.    Final next level of care: [place] Barriers to Discharge: Continued Medical Work up   Patient Goals and CMS Choice Patient states their goals for this hospitalization and ongoing recovery are:: to get better CMS Medicare.gov Compare Post Acute Care list provided to:: Patient Represenative (must comment)(spouse) Choice offered to / list presented to : Spouse  Discharge Placement                       Discharge Plan and Services                 DME Arranged: (NA)         HH Arranged: PT HH Agency: [facility] Date [place]: 12/03/19 Time [place]: 1330 Representative spoke with at [place]: [place]  Social Determinants of Health ([place]) Interventions     Readmission Risk Interventions No flowsheet data found.

## 2019-12-03 NOTE — Progress Notes (Addendum)
Subjective:  Revisited topic of weight loss today.  She reiterated that she recently lost over 100 lbs and states she has had decreased appetite.  She also endorses sweating at night and sometimes soaks through the sheets.  She continues to feel fatigued.  Denies CP or SOB.    Objective:  Vital signs in last 24 hours: Vitals:   12/02/19 2039 12/03/19 0006 12/03/19 0606 12/03/19 0611  BP: 107/61 (!) 101/48 121/65   Pulse: 83 82 (!) 104   Resp: _0 Temp: 97.7 F (36.5 C) 97.6 F (36.4 C) 97.7 F (36.5 C)   TempSrc: Oral Oral Oral   SpO2:  97% 95%   Weight:    56.5 kg  Height:       4 L [place], 97%   Output 2/25:  2,550 (net -1,410)  Weight 2/25:  124.56 lbs (127.4 2/24) On admission:   11/22/19 1457  61.6 kg  135.8 lbs    Physical Exam  Constitutional: She is oriented to person, place, and time. She has a sickly appearance. She appears ill.  HENT:  Head: Normocephalic and atraumatic.  Eyes: Conjunctivae are normal.  Cardiovascular: Regular rhythm. Exam reveals no friction rub.  No murmur heard. bradycardic  Pulmonary/Chest: Effort normal and breath sounds normal. No respiratory distress. She has no wheezes.  Crackles present bilaterally up to mid-lung.    Abdominal: Soft. Bowel sounds are normal. She exhibits no distension. There is no abdominal tenderness.  Musculoskeletal:        General: No edema.  Neurological: She is alert and oriented to person, place, and time.  Skin: Skin is warm and dry.  Psychiatric: She has a normal mood and affect. Her behavior is normal. Judgment and thought content normal.    Labs: CMP and Mg pending at the time note written  Imaging/studies: Bronchoscopy and BAL 2/24 Findings:  Oropharynx, esophagus, vocal cords, subglottic space, trachea all appear normal.  Carina is sharp.  The tracheobronchial tree was examined to at least the first subsegmental level.  Bronchial mucosa and antomy are normal; there are no endobronchial lesions  and no secretions  BAL was performed ni the RML medial segment (b5) of th elung and sent for cell count, bacterial culture, viral smears and culture, and fungal & AFB analysis and cytology.  120 mL of fluid were instilled.  20 mL returned.  The return was cloudy.  There were no mucoid plugs in the return fluid.    Impression:  Bilateral infiltrate.  Airway examination was normal.    BAL fluid cell count  Specimen Description BRONCHIAL ALVEOLAR LAVAGE RML   Special Requests NONE   Gram Stain RARE WBC PRESENT, PREDOMINANTLY MONONUCLEAR  NO ORGANISMS SEEN    Fluid Type-FCT  BROCHIAL ALVEOLAR LAVAGE VC   Comment: CORRECTED ON 02/24 AT 1343: PREVIOUSLY REPORTED AS Bronch Lavag  Color, Fluid YELLOW YELLOWAbnormal    Appearance, Fluid CLEAR HAZYAbnormal    Total Nucleated Cell Count, Fluid 0 - 1,000 cu mm 86   Neutrophil Count, Fluid 0 - 25 % 25   Lymphs, Fluid % 5   Monocyte-Macrophage-Serous Fluid 50 - 90 % 49Low    Eos, Fluid % 21   Other Cells, Fluid % PIGMENT LADEN MACROPHAGES SEEN.     Assessment/Plan:  Principal Problem:   Acute HFrEF (heart failure with reduced ejection fraction) (HCC) Active Problems:   Atrial fibrillation with RVR (HCC)   Chest tightness   Acute on chronic HFrEF (heart failure  with reduced ejection fraction) (HCC)   Atrial fibrillation (HCC)   CKD (chronic kidney disease) stage 3, GFR 30-59 ml/min   Pressure injury of skin  Teresa York is  A 79 y.o f with persistent afib, combined heart failure, htn, hypothyroidism, barrett's esophagus who presented with dyspnea, chest pain. Being treated for acute hypoxic respiratory failure and atrial fibrillation.    Acute HFrEF 2/2 Atrial Fibrillation (EF 35-40% per TTE 2/14) s/p failed cardioversion 2/16  Amiodarone load followed by cardioversion 2/22 --> back to sinus rhythm for one day--> converted back to afib 2/23 10pm.  Stopped amiodarone as she has failed two cardioversions and there is concern for toxicity.   Crackles on lung exam and trace bilateral pitting edema -- will continue PO lasix BID.  She is responding well to lasix; very possible that crackles are d/t fibrosis or other pulmonary process.   -Cardiology/EP appreciate recs: Per EP note today, stable for discharge from their perspective and f/u arranged for next week.   -Stopped amiodarone 2/24 -continue metoprolol 68m BID -- titrate as needed for rate control, doing well for now -continue po lasix 478mBID -continue eliquis 2.65m57mid  (CHA2DS2VASC of at least 8) -continue I/O, daily weights -Not an ablation candidate with age and LA size (4.9 cm) -could consider AV nodal ablation and pacing in the future; but would prefer to avoid if possible.  -BMP pending  Acute hypoxic respiratory distress  Pulmonary infiltrate  2 view chest xray 2/17 showed progressive infiltrate in RUL adjacent to minor fissure.  CTA chest 2/19 showing diffuse bilateral consolidations and ground glass opacities, which could be consistent with amiodarone pulmonary toxicity.  She does not have any fevers, leukocytosis, or elevated pct level to suggest infectious process. CRP slightly elevated at 12.5 and procalcitonin was normal.  Video bronchoscopy with BAL performed on 2/24 and showed bilateral infiltrate, normal appearing airways; BAL returned hazy fluid, pigment-laden macrophages, and 21% eosinophils.  Pigment-laden macrophages could indicate pulmonary hemorrhage or fibrosis. Per pulmonology, high BAL eosinophil count is suggestive of either drug-induced lung toxicity related to amiodarone vs eosinophilic pneumonia.  As above, amiodarone has been discontinued.  Pulmonology recommended treating with glucocorticoids and discussed with patient.  Will start today and taper over 1-2 months.  -  Appreciate pulmonology recs -- start prednisone 40 mg daily. Plan to taper over 1-2 months.   -  Awaiting BAL results--bacterial culture, viral smears and culture, and fungal & AFB  analysis and cytology -  Pulmonology will continue to follow peripherally -- schedule f/u prior to discharge.  -  Consider palliative care c/s given multiple chronic diseases  Possible amyloidosis Given HFrEF, Afib, CKD, and anemia there is concern for amyloidosis. 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 Immunofixation results showed polyclonal increase in IgG.  This can represent, liver disease (liver enzymes normal), connective tissue disease, chronic infection or inflammatory disease, recent immunization, or less likely malignancy.  With patient's recent COVID-19 vaccines, this may the cause.  Less likely amyloidosis given light chain ratio wnl and immunofixation without monoclonal spike. With significant weight loss over the past year, malignancy remains on differential.   AoCKD3 Cr 1.4 2/24 -- improved with transition from IV to PO lasix.  Responding well with good urine output.  -  KCl 75m42mD -  BMP and Mg pending   Iron deficiency anemia s/p IV Feraheme 2/18 -  per cardiology iron polysaccharides 150 mg  daily  Deconditioning:   Discussed patient's wishes for discharge.  She wants to go home and believes she has enough support at home between family and church friends.  She does not wish to go to a SNF.  Most important thing is spending time with family.  Appreciate PT/OT.  Patient declined PT d/t fatigue.  -  OT rec -- HH OT -- 24 hr supervision/assistance.  Recommends 3 in 1 bedside commode. -  PT rec -- HH PT -- 24 hr supervision/assistance  Increased nutrient needs related to chronic illness(CHF)  Dysphagia, unspecified  Appreciate Nutrition recs and SLP recs -Continue Ensure Enlive po to daily, each supplement provides 350 kcal and 20 grams of protein -Continue 30 ml Prostat BID, each supplement provides 100 kclas and 15 grams protein -Continue MVI with minerals daily -Continue Magic cup  TID with meals, each supplement provides 290 kcal and 9 grams of protein -Dysphagia 3 (mechanical soft); thin liquid   Dispo: Anticipated discharge in approximately 2-3 day(s).   Johny Blamer, Medical Student 12/03/2019, 11:18 AM

## 2019-12-03 NOTE — Progress Notes (Signed)
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  Both she and her husband note that she has been having chronic issues with dyspnea on exertion for probably the past 15 years.  At least the last 5 to 7 years she has been severely debilitated and does not walk around much and usually immediately needs to rest of the public.  She has been especially debilitated since her stroke in 2018.  She has a diagnosis of sleep apnea both central and obstructive but has never been able to tolerate CPAP.  She also has Barrett's esophagus and has chronic cough which is mostly only at nighttime and she sleeps on 3 pillows at home.  He also is on a PPI for GERD.  She has been on amiodarone since 2018 for refractory A. fib and has had multiple cardioversions.  She has also been on anticoagulation but has had difficulty with frequent falls and several significant adverse effects of bleeding.  He is currently on apixaban.  Is been here for the last week trying to get her heart rate under better control and has subsequently been diuresed about 5 L.  There is a plan for a DCCV on Monday 2/22, and her amiodarone dose has been increased.  Pulmonary is being consulted to see if the hypoxemia and CT changes she has could be explained by amiodarone pulmonary toxicity.  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   Consults:  PCCM, cardiology  Procedures:    Significant Diagnostic Tests:   2/19 CT angio chest Shows patchy groundglass opacities with lower lobe predominance of small right-sided pleural effusion.  She also has some reactive lymphadenopathy in the hila and mediastinum.  2/14 echocardiogram 1. Left ventricular ejection fraction, by estimation, is 35 to 40%. The  left ventricle has moderately decreased function. The left ventricle  demonstrates global hypokinesis. Left ventricular diastolic function could  not be evaluated.  2. Right ventricular systolic function is moderately reduced. The right  ventricular size is normal. There is mildly elevated pulmonary artery  systolic pressure. The estimated right ventricular systolic pressure is  46.9 mmHg.  3. Left atrial size was severely dilated.  4. Right atrial size was severely dilated.  5. The mitral valve is normal in structure and function. Mild to moderate  mitral valve regurgitation.  6. The aortic valve is tricuspid. Aortic valve regurgitation is moderate.  Mild to moderate aortic valve sclerosis/calcification is present, without  any evidence of aortic stenosis.  7. The inferior vena cava is normal in size with <50% respiratory  variability, suggesting right atrial pressure of 8 mmHg.   Micro Data:  2/14 Covid negative  Antimicrobials:   Subjective/24 hours:  S/p bronchoscopy yesterday. No post procedural complications. Reports she still  feels short of breath. Remains on 2L O2  Objective   Blood pressure 121/65, pulse (!) 104, temperature 97.7 F (36.5 C), temperature source Oral, resp. rate  19, height _0  (1.549 m), weight 56.5 kg, SpO2 95 %.        Intake/Output Summary (Last 24 hours) at 12/03/2019 0956 Last data filed at 12/03/2019 0904 Gross per 24 hour  Intake 1140 ml  Output 2550 ml  Net -1410 ml   Filed Weights   12/01/19 0818 12/02/19 0439 12/03/19 0611  Weight: 56.4 kg 57.8 kg 56.5 kg   Physical Exam: General: Elderly, frail-appearing, no acute distress HENT: [place], AT, OP clear, MMM Eyes: EOMI, no scleral icterus Respiratory: Clear to auscultation bilaterally.  No crackles, wheezing or rales Cardiovascular: Irregular rate and rhythm, -M/R/G, no JVD Extremities:-Edema,-tenderness Psych: Normal mood, normal affect  Assessment & Plan:  Acute hypoxemic respiratory failure Abnormal CT chest, with diffuse groundglass opacities Atrial fibrillation on amiodarone  79 year old female with significant history of atrial fibrillation refractory to multiple cardioversion attempts, hx thromboembolic stroke likely secondary to atrial fibrillation on anticoagulation, Barrett's esophagus with chronic reflux and cough. Pulmonary consulted to consider amiodarone toxicity in setting of worsening respiratory failure.  Patient s/p DCCV on 2/22. She underwent bronchoscopy yesterday and BAL demonstrated 21% eosinophils. Based on these results I am concerned for drug-induced lung toxicity related to amiodarone vs acute eosinophilic pneumonia. Discussed plan with Cardiology team who plan to stop amiodarone anyway since her cardiac rhythm has returned to atrial fibrillation. I have discussed with patient risks and benefits of glucocorticoid therapy. She does currently require oxygen for her symptoms so I would recommend initiating steroids with plan to taper over the next 1-2 months.  Plan --Start prednisone 40 mg daily. Prior to discharge, will arrange Pulmonary follow-up in 2 weeks to initiate slow taper.  --Wean supplemental oxygen for goal >88% --Rate-control per Cardiology   Pulmonary will follow peripherally. Please call as needed or prior to discharge so we can arrange follow-up.  Rodman Pickle, M.D. [facility] Pulmonary/Critical Care Medicine 12/03/2019 10:10 AM   Labs   CBC: Recent Labs  Lab 11/27/19 0455 11/28/19 0722 11/29/19 0407  WBC 13.0* 12.0* 10.0  HGB 10.1* 9.5* 9.4*  HCT 34.2* 32.1* 32.3*  MCV 75.0* 75.2* 77.1*  PLT 320 320 762    Basic Metabolic Panel: Recent Labs  Lab 11/28/19 0722 11/29/19 0407 11/30/19 0245 12/01/19 0719 12/02/19 0709  NA 131* 133* 135 134* 138  K 3.3* 3.6 3.9 4.2 3.8  CL 95* 96* 96* 97* 101  CO2 _1 GLUCOSE 109* 105* 103* 98 94  BUN 31* 39* 45* 54* 47*  CREATININE 1.30* 1.25* 1.28* 1.56* 1.40*  CALCIUM 9.2 9.2 9.4 9.4 9.4  MG 2.1 2.0 1.8 2.4 1.9   GFR: Estimated Creatinine Clearance: 24.2 mL/min (A) (by C-G formula based on SCr of 1.4 mg/dL (H)). Recent Labs  Lab 11/27/19 0455 11/28/19 0722 11/29/19 0407  WBC 13.0* 12.0* 10.0    Liver Function Tests: No results for input(s): AST, ALT, ALKPHOS, BILITOT, PROT, ALBUMIN in the last 168 hours. No results for input(s): LIPASE, AMYLASE in the last 168 hours. No results for input(s): AMMONIA in the last 168 hours.  ABG    Component Value Date/Time   PHART 7.474 (H) 11/24/2019 1754   PCO2ART 31.7 (L) 11/24/2019 1754   PO2ART 100 11/24/2019 1754   HCO3 23.1 11/24/2019 1754   TCO2 27 11/13/2017 2211   ACIDBASEDEF  0.1 11/24/2019 1754   O2SAT 98.2 11/24/2019 1754     Coagulation Profile: Recent Labs  Lab 11/27/19 1952  INR 1.7*    Cardiac Enzymes: No results for input(s): CKTOTAL, CKMB, CKMBINDEX, TROPONINI in the last 168 hours.  HbA1C: Hgb A1c MFr Bld  Date/Time Value Ref Range Status  11/14/2017 05:00 AM 4.8 4.8 - 5.6 % Final    Comment:    (NOTE) Pre diabetes:          5.7%-6.4% Diabetes:              >6.4% Glycemic control for   <7.0% adults with diabetes     CBG: No results for input(s): GLUCAP in the last 168  hours.

## 2019-12-03 NOTE — Progress Notes (Signed)
Nurse paged IMTS and reported that patient has been anxious and panicked for the past hour, having trouble catching her breath.  He reported that she is satting fine on RA (~90%).  She has been crying, and is inconsolable.  Has been stating she "wants to die."  Husband is at bedside.  Primary team reported to bedside.  Patient was sitting upright in chair with slight tachypnea and satting 90% on RA.  Patient alert and oriented x3. She says that she has been told she is at [facility], but has a difficult time believing that.  Patient was re-oriented and she calmed down significantly.  Supplemental O2 was added back.  Patient fell asleep in her chair.  Patient's husband stated he will not be able to care for his wife on his own at home.  They would like to use [facility] because patient has a good relationship with the physical therapist there.  However, OT and nursing is not available at [facility].  Patient has stated she does not want to go to a SNF.  Med student spoke to Neoma Laming, Tourist information centre manager, and inquired about adding nursing aide services to [facility] order.  She will explore options with other [facility] agencies, though she thinks it is unlikely insurance will cover an aide.  Otherwise, would be in patient's best interest to hire private agency for aide.

## 2019-12-03 NOTE — Progress Notes (Signed)
Occupational Therapy Treatment Patient Details Name: Teresa York MRN: [id number] DOB: [birth year] Today's Date: 12/03/2019    History of present illness Pt is a 79 y.o. F with significant PMH of stroke, afib, CHF, who presents with SOB, elevated HR, and hypokalemia. Received IV lasix in ED. S/P cardioversion 2/16, but now back in afib with plan to pursue rate control per notes.   OT comments  Patient in bed upon arrival. Provide patient with energy conservation hand out and educate patient on strategies to implement at home to maximize safety and independence with self care. Patient verbalize understanding. Patient decline out of bed activity at this time, educate patient on importance of out of bed activity to maximize strength, activity tolerance necessary for self care.    Follow Up Recommendations  Home health OT;Supervision/Assistance - 24 hour    Equipment Recommendations  3 in 1 bedside commode    Recommendations for Other Services Other (comment)(palliative for goals of care)    Precautions / Restrictions Precautions Precautions: Fall Precaution Comments: watch HR and O2       Mobility Bed Mobility Overal bed mobility: Needs Assistance Bed Mobility: Supine to Sit     Supine to sit: Min guard;HOB elevated     General bed mobility comments: patient declined out of bed mobility  Transfers Overall transfer level: Needs assistance Equipment used: Rolling walker (2 wheeled)   Sit to Stand: Min guard         General transfer comment: patient declined out of bed activity at this time    Balance Overall balance assessment: Needs assistance Sitting-balance support: No upper extremity supported;Feet supported Sitting balance-Leahy Scale: Good     Standing balance support: During functional activity;Single extremity supported Standing balance-Leahy Scale: Fair Standing balance comment: pt able to pull up Depends with single UE support and min guard                            ADL either performed or assessed with clinical judgement   ADL                                         General ADL Comments: provide patient with energy conservation hand out and educate patient on strategies to implement at home to maximize safety/independence at home. patient verbalize understanding.                Cognition Arousal/Alertness: Awake/alert Behavior During Therapy: WFL for tasks assessed/performed Overall Cognitive Status: Within Functional Limits for tasks assessed                                 General Comments: spouse present and encouraging activity              General Comments educate patient on importance of out of bed activity for strengthening, activity tolerance, independence with self care. patient verbalize understanding    Pertinent Vitals/ Pain       Pain Assessment: No/denies pain     Prior Functioning/Environment              Frequency  Min 2X/week        Progress Toward Goals  OT Goals(current goals can now be found in the care plan section)  Progress towards OT goals: Progressing toward goals  Acute  Rehab OT Goals Patient Stated Goal: to feel better OT Goal Formulation: With patient Time For Goal Achievement: 12/09/19 Potential to Achieve Goals: Good ADL Goals Pt Will Perform Grooming: with modified independence;standing Pt Will Perform Lower Body Dressing: with modified independence;sit to/from stand Pt Will Transfer to Toilet: with modified independence;ambulating Pt Will Perform Toileting - Clothing Manipulation and hygiene: with modified independence;sit to/from stand Pt Will Perform Tub/Shower Transfer: Tub transfer;tub bench;ambulating;with modified independence;rolling walker Additional ADL Goal #1: Pt will utilize 3 energy conservation techniques to optimize independence and safety during ADL routine with independence.  Plan Discharge plan remains appropriate        AM-PAC OT "6 Clicks" Daily Activity     Outcome Measure   Help from another person eating meals?: None Help from another person taking care of personal grooming?: A Little Help from another person toileting, which includes using toliet, bedpan, or urinal?: A Little Help from another person bathing (including washing, rinsing, drying)?: A Little Help from another person to put on and taking off regular upper body clothing?: A Little Help from another person to put on and taking off regular lower body clothing?: A Little 6 Click Score: 19    End of Session Equipment Utilized During Treatment: Oxygen  OT Visit Diagnosis: Unsteadiness on feet (R26.81);Muscle weakness (generalized) (M62.81)   Activity Tolerance Patient limited by fatigue   Patient Left in bed;with call bell/phone within reach           Time: [id number] OT Time Calculation (min): 10 min  Charges: OT General Charges $OT Visit: 1 Visit OT Treatments $Self Care/Home Management : 8-22 mins  Shon Millet OT OT office: [place] 12/03/2019, 12:27 PM

## 2019-12-03 NOTE — Progress Notes (Signed)
MD came to room see patient and husband no new orders where noted. RN who has patient in see patient aware. She has fair amount urine output in suction cannister,she is mainting her sats this time if she will leave her o2 on. Her confusion is worse with the o2 off. She has hx of delirium. She is s/p bronch she had the bronch yesterday MD talked with husband about this the results of it and reported did not feel this had anything to do with what was going on with her at present. Husband calmed down and satisfoed.

## 2019-12-03 NOTE — Telephone Encounter (Signed)
Mr. Brandow come to the office today to talk with Dr. Rayann Heman regarding his wife who is in the hospital.   He stated that Dr. Rayann Heman called him last night but he missed the call.   He want to discuss his wife having home health when she is discharge on tomorrow.

## 2019-12-03 NOTE — Progress Notes (Addendum)
Electrophysiology Rounding Note  Patient Name: Teresa York Date of Encounter: 12/03/2019  Primary Cardiologist: Lauree Chandler, MD Electrophysiologist: Thompson Grayer, MD   Subjective   The patient is doing well today. She continues to feel improved overall. Denies SOB at rest. She would like to go home.   Inpatient Medications    Scheduled Meds: . apixaban  2.5 mg Oral BID  . cholecalciferol  1,000 Units Oral Daily  . feeding supplement (ENSURE ENLIVE)  237 mL Oral QHS  . feeding supplement (PRO-STAT SUGAR FREE 64)  30 mL Oral BID  . FLUoxetine  40 mg Oral Daily  . furosemide  40 mg Oral BID  . iron polysaccharides  150 mg Oral Daily  . latanoprost  1 drop Both Eyes QHS  . levothyroxine  175 mcg Oral Q0600  . metoprolol tartrate  25 mg Oral BID  . multivitamin with minerals  1 tablet Oral Daily  . pantoprazole  80 mg Oral Daily  . potassium chloride  10 mEq Oral Daily  . traZODone  75 mg Oral QHS   Continuous Infusions:  PRN Meds: acetaminophen **OR** acetaminophen, benzonatate, hydrocortisone cream, promethazine, senna-docusate   Vital Signs    Vitals:   12/02/19 2039 12/03/19 0006 12/03/19 0606 12/03/19 0611  BP: 107/61 (!) 101/48 121/65   Pulse: 83 82 (!) 104   Resp: _0 Temp: 97.7 F (36.5 C) 97.6 F (36.4 C) 97.7 F (36.5 C)   TempSrc: Oral Oral Oral   SpO2:  97% 95%   Weight:    56.5 kg  Height:        Intake/Output Summary (Last 24 hours) at 12/03/2019 0838 Last data filed at 12/03/2019 9371 Gross per 24 hour  Intake 1140 ml  Output 2550 ml  Net -1410 ml   Filed Weights   12/01/19 0818 12/02/19 0439 12/03/19 0611  Weight: 56.4 kg 57.8 kg 56.5 kg    Physical Exam    GEN- The patient is elderly appearing, alert and oriented x 3 today.   Head- normocephalic, atraumatic Eyes-  Sclera clear, conjunctiva pink Ears- hearing intact Oropharynx- clear Neck- supple Lungs- Clear to ausculation bilaterally, normal work of  breathing Heart- irregularly irregular rate and rhythm, no murmurs, rubs or gallops GI- soft, NT, ND, + BS Extremities- no clubbing, cyanosis, or edema Skin- no rash or lesion Psych- euthymic mood, full affect Neuro- strength and sensation are intact  Labs    CBC No results for input(s): WBC, NEUTROABS, HGB, HCT, MCV, PLT in the last 72 hours. Basic Metabolic Panel Recent Labs    12/01/19 0719 12/02/19 0709  NA 134* 138  K 4.2 3.8  CL 97* 101  CO2 24 28  GLUCOSE 98 94  BUN 54* 47*  CREATININE 1.56* 1.40*  CALCIUM 9.4 9.4  MG 2.4 1.9   Liver Function Tests No results for input(s): AST, ALT, ALKPHOS, BILITOT, PROT, ALBUMIN in the last 72 hours. No results for input(s): LIPASE, AMYLASE in the last 72 hours. Cardiac Enzymes No results for input(s): CKTOTAL, CKMB, CKMBINDEX, TROPONINI in the last 72 hours.   Telemetry    Atrial fibrillation with overall controlled rates in 80-90s (personally reviewed)  Radiology    DG CHEST PORT 1 VIEW  Result Date: 12/01/2019 CLINICAL DATA:  Heart failure.  Hypoxemia. EXAM: PORTABLE CHEST 1 VIEW COMPARISON:  11/29/2019 FINDINGS: Left axillary surgical clips. Moderate cardiomegaly. Atherosclerosis in the transverse aorta. No pleural effusion or pneumothorax. Relatively diffuse interstitial opacities, progressive. Most  significant increase in the left perihilar and right infrahilar regions. Osteopenia with upper thoracic vertebral augmentation. IMPRESSION: Worsened bilateral interstitial opacities, pulmonary edema versus atypical infection. Moderate cardiomegaly. Aortic Atherosclerosis (ICD10-I70.0). Electronically Signed   By: Abigail Miyamoto M.D.   On: 12/01/2019 19:34    Patient Profile     Elisabet Gutzmer a 79 y.o.femalewith a past medical history significant for HTN, prior stroke, barretts esophagus, GERD, chronic back pain,breast ca (L, s/p lumpectomy), hypothyroidism, OSA w/CPAP, NICM (suspect 2/2 to RVR), chronic CHF (systolic >>  diastolic), with recovered LVEF, and persistent AFib.shewas admitted for acute CHF in the setting AF and EP asked to see.  Assessment & Plan      1. Persistent AF, atypical flutter Continue Eliquis for CHA2DS2VASC of at least 8   She is back in AF. At this time, will pursue rate control.  Continue lopressor 25 mg BID. Will plan to titrate as needed for rate control. Overall controlled in 80-90s currently.  Not an AF ablation candidate with age and LA size (4.9 cm) Could eventually consider AVN ablation and pacing.   2. Acute CHF Echo 11/22/2019 with LVEF 35-40% Volume status stabilized on po diuretics.  BMET pending today.   3. ? Amiodarone pulmonary toxicity CT chest 11/27/19 showed bilateral consolidationand ground-glass opacities, raising concern for amio toxicity as contributing factor to her dyspnea.  Video bronchoscopy 12/02/2019 with bilateral infiltrate, normal appearing airways and Bronchoalveolar lavage performed in the RML medial segment (B5) of the lung and sent for cell count, bacterial culture, viral smears & culture, and fungal & AFB analysis and cytology.  Amiodarone off for now with persistent AF.   4. AKI 1.5 -> 1.4 -> pending.  Continue to follow on po diuretics  5. 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   IFE "reincubated for better growth" on 2/24 per results?.  IgG elevated on initial. IgM WNL. Per primary.   She is feeling better and we have decided on rate control as above She is stable for discharge from an EP perspective with close follow up arranged for next week.   EP will see as needed if she remains here. Please call with any questions.   For questions or updates, please contact [place] Please consult [email or web address] for contact info under Cardiology/STEMI.  Signed, Shirley Friar, PA-C  12/03/2019, 8:38 AM    I have  seen, examined the patient, and reviewed the above assessment and plan.  Changes to above are made where necessary.  On exam, iRRR.  afib V rates are controlled.  OK to discharge from cardiology standpoint with close outpatient follow-up. Overall prognosis is poor.  Co Sign: Thompson Grayer, MD 12/03/2019 9:48 PM

## 2019-12-03 NOTE — Progress Notes (Signed)
Husband came to desk c/o not seeing a nurse since 10 am,reported his wife was in distress and needed help. Writer went down to room took her vitals,her female nurse came in and asked if he needed something Husband stated did not realize he was her nurse then told me thought he was the CNA that he has been in and out of the room he apologized. Husband reported wife asking to die,she told me could not get her breath in she had removed her o2 I placed it back on her. Her o2 sats ranged 88-93%. Vs bp 113  /54  Temp 97.7. She tells staff does not believe she is in the hospital is delirious and confused. Husband states she is lethargic not acting right. So Rn paged MD reported would be up to check patient out.

## 2019-12-04 ENCOUNTER — Ambulatory Visit: Payer: PPO | Admitting: Physician Assistant

## 2019-12-04 LAB — BASIC METABOLIC PANEL
Anion gap: 11 (ref 5–15)
BUN: 38 mg/dL — ABNORMAL HIGH (ref 8–23)
CO2: 31 mmol/L (ref 22–32)
Calcium: 9.4 mg/dL (ref 8.9–10.3)
Chloride: 95 mmol/L — ABNORMAL LOW (ref 98–111)
Creatinine, Ser: 1.1 mg/dL — ABNORMAL HIGH (ref 0.44–1.00)
GFR calc Af Amer: 55 mL/min — ABNORMAL LOW (ref >60)
GFR calc non Af Amer: 47 mL/min — ABNORMAL LOW (ref >60)
Glucose, Bld: 99 mg/dL (ref 70–99)
Potassium: 3.4 mmol/L — ABNORMAL LOW (ref 3.5–5.1)
Sodium: 137 mmol/L (ref 135–145)

## 2019-12-04 LAB — URINALYSIS, ROUTINE W REFLEX MICROSCOPIC
Bilirubin Urine: NEGATIVE
Glucose, UA: NEGATIVE mg/dL
Hgb urine dipstick: NEGATIVE
Ketones, ur: NEGATIVE mg/dL
Nitrite: NEGATIVE
Protein, ur: NEGATIVE mg/dL
Specific Gravity, Urine: 1.008 (ref 1.005–1.030)
pH: 7 (ref 5.0–8.0)

## 2019-12-04 LAB — CULTURE, BAL-QUANTITATIVE W GRAM STAIN: Culture: NORMAL

## 2019-12-04 LAB — MAGNESIUM: Magnesium: 1.5 mg/dL — ABNORMAL LOW (ref 1.7–2.4)

## 2019-12-04 MED ORDER — ENSURE ENLIVE PO LIQD: 237.0000 mL | Freq: Every day | ORAL | 12 refills | Status: AC

## 2019-12-04 MED ORDER — POTASSIUM CHLORIDE CRYS ER 20 MEQ PO TBCR
40.0000 meq | EXTENDED_RELEASE_TABLET | Freq: Once | ORAL | Status: AC
  Administered 2019-12-04: 40 meq via ORAL
  Filled 2019-12-04: qty 2

## 2019-12-04 MED ORDER — VITAMIN D3 25 MCG PO TABS: 1000.0000 [IU] | ORAL_TABLET | Freq: Every day | ORAL | 0 refills | Status: AC

## 2019-12-04 MED ORDER — PREDNISONE 20 MG PO TABS: 40.0000 mg | ORAL_TABLET | Freq: Every day | ORAL | 0 refills | Status: AC

## 2019-12-04 MED ORDER — FUROSEMIDE 40 MG PO TABS: 40.0000 mg | ORAL_TABLET | Freq: Two times a day (BID) | ORAL | 0 refills | Status: AC

## 2019-12-04 MED ORDER — MAGNESIUM SULFATE 4 GM/100ML IV SOLN
4.0000 g | Freq: Once | INTRAVENOUS | Status: AC
  Administered 2019-12-04: 4 g via INTRAVENOUS
  Filled 2019-12-04: qty 100

## 2019-12-04 MED ORDER — POLYSACCHARIDE IRON COMPLEX 150 MG PO CAPS: 150.0000 mg | ORAL_CAPSULE | Freq: Every day | ORAL | 0 refills | Status: AC

## 2019-12-04 MED ORDER — METOPROLOL TARTRATE 25 MG PO TABS: 25.0000 mg | ORAL_TABLET | Freq: Two times a day (BID) | ORAL | 0 refills | Status: AC

## 2019-12-04 MED ORDER — PRO-STAT SUGAR FREE PO LIQD: 30.0000 mL | Freq: Two times a day (BID) | ORAL | 5 refills | Status: AC

## 2019-12-04 NOTE — Patient Outreach (Signed)
Referral: [facility] IP Discharge to [place] for processing. Emailed information to [email or web address] on 12/04/19.  12/29/2019. [place] Management Assistant

## 2019-12-04 NOTE — TOC Transition Note (Addendum)
Transition of Care [facility]) - CM/SW Discharge Note   Patient Details  Name: Teresa York MRN: [id number] Date of Birth: 10-Aug-1941  Transition of Care [facility] CM/SW Contact:  Zenon Mayo, RN Phone Number: 12/04/2019, 12:50 PM   Clinical Narrative:    Patient for possible dc today, NCM notified [facility] that patient is for for possible dc today. Patient will need home oxygen, referral made to [facility] with [employer]. He will have oxygen brought up to room and facilitate having it set up at patient's home.    Final next level of care: [place] Barriers to Discharge: No Barriers Identified   Patient Goals and CMS Choice Patient states their goals for this hospitalization and ongoing recovery are:: to get better CMS Medicare.gov Compare Post Acute Care list provided to:: Patient Represenative (must comment)(spouse) Choice offered to / list presented to : Spouse  Discharge Placement                       Discharge Plan and Services                DME Arranged: (NA)         HH Arranged: PT, Nurse's Aide HH Agency: [facility] Date [place]: 12/03/19 Time [place]: 1330 Representative spoke with at [place]: [place] ([place]) Interventions     Readmission Risk Interventions No flowsheet data found.

## 2019-12-04 NOTE — Progress Notes (Signed)
Subjective:  Patient states that she is doing well. She denies any palpitations or dyspnea. She would like to go home today.    Objective:  Vital signs in last 24 hours: Vitals:   12/03/19 2016 12/04/19 0124 12/04/19 0339 12/04/19 0343  BP: (!) 112/51 100/60 115/69   Pulse: 89 97 93   Resp: _0 Temp: (!) 97.4 F (36.3 C) 97.9 F (36.6 C) (!) 97.5 F (36.4 C)   TempSrc: Oral Oral Oral   SpO2: 99% 97%    Weight:    54.9 kg  Height:       4 L [place], 97%   Output 2/25-2/26:  1200 mL (net -840)  Weight  12/04/19 0343  54.9 kg  121.03 lbs   12/03/19 0611  56.5 kg  124.56 lbs   12/02/19   57.8 kg  127.4 lbs    Physical Exam  Constitutional: She is oriented to person, place, and time. She appears well-developed. She is cooperative. No distress.  HENT:  Head: Normocephalic and atraumatic.  Eyes: Conjunctivae are normal.  Cardiovascular: Exam reveals no friction rub.  Murmur heard. Irregularly irregular rhythm  Pulmonary/Chest: Effort normal and breath sounds normal. No respiratory distress. She has no wheezes.  Crackles present bilaterally up to mid-lung.    Abdominal: Soft. Bowel sounds are normal. She exhibits no distension. There is no abdominal tenderness.  Musculoskeletal:        General: No edema.  Neurological: She is alert and oriented to person, place, and time.  Skin: Skin is warm and dry.  Psychiatric: She has a normal mood and affect. Her behavior is normal. Judgment and thought content normal.    Labs: CMP Latest Ref Rng & Units 12/04/2019 12/02/2019 12/01/2019  Glucose 70 - 99 mg/dL 99 94 98  BUN 8 - 23 mg/dL 38(H) 47(H) 54(H)  Creatinine 0.44 - 1.00 mg/dL 1.10(H) 1.40(H) 1.56(H)  Sodium 135 - 145 mmol/L 137 138 134(L)  Potassium 3.5 - 5.1 mmol/L 3.4(L) 3.8 4.2  Chloride 98 - 111 mmol/L 95(L) 101 97(L)  CO2 22 - 32 mmol/L _1 Calcium 8.9 - 10.3 mg/dL 9.4 9.4 9.4  Total Protein 6.5 - 8.1 g/dL - - -  Total Bilirubin 0.3 - 1.2 mg/dL - - -   Alkaline Phos 38 - 126 U/L - - -  AST 15 - 41 U/L - - -  ALT 0 - 44 U/L - - -   Mg 1.5   Imaging/studies:  BAL fluid 2/24  Cell count: Specimen Description BRONCHIAL ALVEOLAR LAVAGE RML   Special Requests NONE   Gram Stain RARE WBC PRESENT, PREDOMINANTLY MONONUCLEAR  NO ORGANISMS SEEN    Fluid Type-FCT  BROCHIAL ALVEOLAR LAVAGE VC   Comment: CORRECTED ON 02/24 AT 1343: PREVIOUSLY REPORTED AS Bronch Lavag  Color, Fluid YELLOW YELLOWAbnormal    Appearance, Fluid CLEAR HAZYAbnormal    Total Nucleated Cell Count, Fluid 0 - 1,000 cu mm 86   Neutrophil Count, Fluid 0 - 25 % 25   Lymphs, Fluid % 5   Monocyte-Macrophage-Serous Fluid 50 - 90 % 49Low    Eos, Fluid % 21   Other Cells, Fluid % PIGMENT LADEN MACROPHAGES SEEN.    Culture:   Specimen Description BRONCHIAL ALVEOLAR LAVAGE RML   Special Requests NONE   Gram Stain RARE WBC PRESENT, PREDOMINANTLY MONONUCLEAR  NO ORGANISMS SEEN   Culture CULTURE REINCUBATED FOR BETTER GROWTH  Performed at [facility], [place]  [address], [place], [place] [address]   Report Status PENDING    Cytology:  no malignant cells identified Acid fast smear negative  Assessment/Plan:  Principal Problem:   Acute HFrEF (heart failure with reduced ejection fraction) (HCC) Active Problems:   Atrial fibrillation with RVR (HCC)   Chest tightness   Acute on chronic HFrEF (heart failure with reduced ejection fraction) (HCC)   Atrial fibrillation (HCC)   CKD (chronic kidney disease) stage 3, GFR 30-59 ml/min   Pressure injury of skin  [name] is  A 79 y.o f with persistent afib, combined heart failure, htn, hypothyroidism, barrett's esophagus who presented with dyspnea, chest pain. Being treated for acute hypoxic respiratory failure and atrial fibrillation.    Acute hypoxic respiratory distress  Pulmonary infiltrate  2 view chest xray 2/17 showed progressive infiltrate in RUL adjacent to minor fissure.  CTA chest 2/19 showing diffuse bilateral  consolidations and ground glass opacities, which could be consistent with amiodarone pulmonary toxicity, or other causes of ILD including CT disease, sarcoidosis or hypersensitivity reaction, . No fevers, leukocytosis, or elevated pct level to suggest infectious process. CRP slightly elevated at 12.5 and procalcitonin was normal.  Video bronchoscopy with BAL performed on 2/24 and showed bilateral infiltrate, normal appearing airways; BAL returned hazy fluid, pigment-laden macrophages, and 21% eosinophils.  Pigment-laden macrophages could indicate pulmonary hemorrhage or fibrosis. Per pulmonology, high BAL eosinophil count is suggestive of either drug-induced lung toxicity related to amiodarone vs eosinophilic pneumonia. Amiodarone has been discontinued.  Pulmonology started prednisone with plan to taper over 1-2 months.  AFB smear negative and no malignant cells present in BAL fluid. Plan to discharge today with [facility] and close outpatient follow-up.  Patient requires O2 at rest so will discharge with home O2. -  Appreciate pulmonology recs -  Continue prednisone 40 mg daily. Plan to taper over 1-2 months.   -  F/u with Pulmonology arranged as outpatient -  BAL results pending --bacterial culture, viral smears and culture, and fungal analysis -  Discharge with home O2 4L [place] -  Consider palliative care in outpatient setting   Acute HFrEF 2/2 Atrial Fibrillation (EF 35-40% per TTE 2/14) s/p failed cardioversion 2/16  Amiodarone load followed by cardioversion 2/22 --> back to sinus rhythm for one day--> converted back to afib 2/23 10pm.  Stopped amiodarone as she has failed two cardioversions and there is concern for toxicity.  Crackles on lung exam and trace bilateral pitting edema -- will continue PO lasix BID.  She is responding well to lasix; very possible that crackles are d/t fibrosis or other pulmonary process.   -Cardiology/EP appreciate recs: Stable for discharge from their perspective and f/u arranged  for next week.   -Stopped amiodarone 2/24 -continue metoprolol 14m BID -- titrate as needed for rate control, doing well for now -continue po lasix 460mBID -continue eliquis 2.14m37mid  (CHA2DS2VASC of at least 8) -continue I/O, daily weights -Not an ablation candidate with age and LA size (4.9 cm) -could consider AV nodal ablation and pacing in the future; but would prefer to avoid if possible.   Possible amyloidosis Given HFrEF, Afib, CKD, and anemia there is concern for amyloidosis. 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 Immunofixation results showed polyclonal increase in IgG.  This can represent, liver disease (liver enzymes normal), connective tissue disease, chronic infection or inflammatory disease, recent immunization, or less likely malignancy.  With patient's recent COVID-19 vaccines,  this may the cause.  Less likely amyloidosis given light chain ratio wnl and immunofixation without monoclonal spike. With significant weight loss over the past year, malignancy remains on differential.   AoCKD3 Cr 1.10 2/26 -- improved with transition from IV to PO lasix.  Responding well with good urine output.  -  Replete Mg and K+ today.     Iron deficiency anemia s/p IV Feraheme 2/18 -  per cardiology iron polysaccharides 150 mg daily  Deconditioning:   She wants to go home and believes she has enough support at home between family and church friends.  Husband has made it clear that he will not be able to care for her on his own at home.  She does not wish to go to a SNF.  Most important thing is spending time with family.  Patient has been declining PT services inpatient d/t fatigue, but did work with them yesterday.  Was able to walk 105' with rollator walker and O2 sats stable on 4 L O2, HR up to 105 with ambulation.   -  Appreciate PT/OT and case manager -  Ordered HH PT and nursing aide through agency  [facility].  Pt and husband declined OT because not available though preferred agency at this time.  They will consider OT at a later time.   -  OT rec -- HH OT -- 24 hr supervision/assistance.  Recommends 3 in 1 bedside commode -- ordered -  PT rec -- HH PT -- 24 hr supervision/assistance  Increased nutrient needs related to chronic illness(CHF)  Dysphagia, unspecified  Appreciate Nutrition recs and SLP recs -Dysphagia 3 (mechanical soft); thin liquid -Continue Ensure Enlive po to daily, each supplement provides 350 kcal and 20 grams of protein -Continue 30 ml Prostat BID, each supplement provides 100 kclas and 15 grams protein -Continue MVI with minerals daily -Continue Magic cup TID with meals, each supplement provides 290 kcal and 9 grams of protein    Dispo: Anticipated discharge today  Johny Blamer, Medical Student 12/04/2019, 12:30 PM

## 2019-12-04 NOTE — Discharge Summary (Addendum)
Name: Teresa York MRN: [id number] DOB: 1941-05-03 79 y.o. PCP: Patrecia Pour, Christean Grief, MD  Date of Admission: 11/22/2019 10:02 AM Date of Discharge: 12/04/2019 Attending Physician: Dr. Lenice Pressman  Discharge Diagnosis: Acute hypoxic respiratory distress  Pulmonary infiltrate  Acute HFrEF (EF 35-40%) Atrial Fibrillation  Acute on chronic kidney disease Iron deficiency anemia  Deconditioning   Discharge Medications: Allergies as of 12/04/2019       Reactions   Benazepril Cough   Cough   Diltiazem Hcl Rash        Medication List     STOP taking these medications    amiodarone 200 MG tablet Commonly known as: PACERONE   apixaban 5 MG Tabs tablet Commonly known as: Eliquis       TAKE these medications    acetaminophen 325 MG tablet Commonly known as: TYLENOL Take 650 mg by mouth 2 (two) times daily as needed for mild pain.   cetirizine 10 MG tablet Commonly known as: ZYRTEC Take 10 mg by mouth daily. Reported on 10/27/2015   D 1000 25 MCG (1000 UT) capsule Generic drug: Cholecalciferol Take 1,000 Units by mouth daily. Reported on 11/03/2015 What changed: Another medication with the same name was added. Make sure you understand how and when to take each.   Vitamin D3 25 MCG tablet Commonly known as: Vitamin D Take 1 tablet (1,000 Units total) by mouth daily. What changed: You were already taking a medication with the same name, and this prescription was added. Make sure you understand how and when to take each.   Daily Probiotic Caps Take 1 tablet by mouth daily.   diphenhydramine-acetaminophen 25-500 MG Tabs tablet Commonly known as: TYLENOL PM Take 1 tablet by mouth at bedtime as needed (sleep).   feeding supplement (ENSURE ENLIVE) Liqd Take 237 mLs by mouth at bedtime.   feeding supplement (PRO-STAT SUGAR FREE 64) Liqd Take 30 mLs by mouth 2 (two) times daily.   FLUoxetine 40 MG capsule Commonly known as: PROZAC Take 40 mg by mouth daily.     fluticasone 50 MCG/ACT nasal spray Commonly known as: FLONASE Place 1 spray into both nostrils daily as needed for allergies.   furosemide 40 MG tablet Commonly known as: LASIX Take 1 tablet (40 mg total) by mouth 2 (two) times daily.   ICaps Caps Take 1 capsule by mouth 2 (two) times daily.   iron polysaccharides 150 MG capsule Commonly known as: NIFEREX Take 1 capsule (150 mg total) by mouth daily.   latanoprost 0.005 % ophthalmic solution Commonly known as: XALATAN Place 1 drop into both eyes at bedtime.   levothyroxine 175 MCG tablet Commonly known as: SYNTHROID Take 175 mcg by mouth daily before breakfast.   MAGOX 400 PO Take 400 mg by mouth daily.   meclizine 25 MG tablet Commonly known as: ANTIVERT Take 25 mg by mouth 3 (three) times daily as needed for dizziness or nausea.   metoprolol tartrate 25 MG tablet Commonly known as: LOPRESSOR Take 1 tablet (25 mg total) by mouth 2 (two) times daily.   omeprazole 40 MG capsule Commonly known as: PRILOSEC Take 40 mg by mouth at bedtime.   ondansetron 4 MG tablet Commonly known as: ZOFRAN Take 4 mg by mouth every 8 (eight) hours as needed for nausea or vomiting.   potassium chloride 10 MEQ tablet Commonly known as: KLOR-CON Take 10 mEq by mouth daily.   predniSONE 20 MG tablet Commonly known as: DELTASONE Take 2 tablets (40 mg total) by mouth  daily with breakfast.   spironolactone 50 MG tablet Commonly known as: ALDACTONE Take 50 mg by mouth daily as needed (ankle swelling).   traZODone 150 MG tablet Commonly known as: DESYREL Take 75 mg by mouth at bedtime.        Disposition and follow-up:   TeresaAmere York was discharged from [facility] in Stable condition.  At the hospital follow up visit please address:  1.   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 -  continue metoprolol 12m BID -  continue po lasix 429mBID -  continue eliquis 2.55m67mid  (CHA2DS2VASC of at least 8) -  Not an ablation candidate with age and LA size (4.9 cm) -  Per cards, could consider ablation or pacing in the future; but would prefer to avoid if possible. -  Consider continuing amyloidosis workup with PYP scan in outpatient setting  #Acute hypoxic respiratory distress  Pulmonary infiltrate  -  Continue prednisone 40 mg daily. Plan to taper over 1-2 months.   -  F/u with Pulmonology arranged as outpatient -  BAL results pending --bacterial culture, viral smears and culture, and fungal analysis -  Discharge with home O2 4L [place] -  Consider HRCT -  Consider palliative care in outpatient setting  #Deconditioning: -  HH PT -  HH aide  #Delirium:  Likely caused by hospitalization -  Follow up on UA and Cx (ordered on day of discharge)   #Increased nutrient needs related to chronic illness(CHF)  Dysphagia, unspecified  -  Continue Ensure and Prostat supplements  2.  Labs / imaging needed at time of follow-up: BMP, Mg 3.  Pending labs/ test needing follow-up: BAL results, UA and Cx  Follow-up Appointments: Follow-up Information     TilShirley FriarA-C On 12/11/2019.   Specialty: Physician Assistant Why: at [facility].  Contact information: [address] [place] [phone]      [facility] Follow up.   Specialty: Home Health Services Why: HHPT, aide Contact information: [phone]         [facility] Follow up.   Why: bedside commode, home oxygen Contact information: [address] [place] [phone]         SilDoreatha LewD.   Specialty: Family Medicine Why: The office will call patient with appointment Contact  information: [address]  [phone]         Hospital Course by problem list:   Teresa York  A [age]o f with persistent afib, combined heart failure, htn, hypothyroidism, barrett's esophagus who presented with dyspnea, chest pain. Being treated for acute hypoxic respiratory failure and atrial fibrillation.   Acute HFrEF 2/2 Atrial Fibrillation (EF 35-40% per TTE 2/14) s/p failed cardioversion 2/16 and 2/22 Patient presented with two weeks of CP and SOB, which worsened in the days prior to admission. ACS workup negative.  She was found to be in afib and fluid overloaded on exam.  Echo showed significantly reduced EF 35-40%  compared to normal EF in last Echo (2019).  Additionally, R and L atria were severely dilated, and she had mild to  moderate MV regurg and moderate aortic regurg.  She responded well to diuresis -- she had not been taking Lasix at home due to urinary incontinence.  She underwent cardioversion twice (2nd time with amiodarone load), but did not maintain sinus rhythm. Amiodarone was discontinued d/t unsuccessful cardioversions and possible amiodarone lung toxicity.  Her chest discomfort resolved with rate control.  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  Continue metoprolol, eliquis, and lasix BID.  She will follow up with EP on March 5.    Acute hypoxic respiratory distress  Pulmonary infiltrate  2 view chest xray 2/17 showed progressive infiltrate in RUL adjacent to minor fissure.  CTA chest 2/19 showing diffuse bilateral consolidations and ground glass opacities, which could be consistent with amiodarone pulmonary toxicity, or other causes of ILD including CT disease, sarcoidosis or hypersensitivity reaction. No fevers, leukocytosis, or elevated PCT  level to suggest infectious process. CRP slightly elevated at 12.5.  Video bronchoscopy with BAL performed on 2/24 and showed bilateral infiltrate, normal appearing airways; BAL returned hazy fluid, pigment-laden macrophages, and 21% eosinophils.  Pigment-laden macrophages could indicate pulmonary hemorrhage or fibrosis. Per pulmonology, high BAL eosinophil count is suggestive of either drug-induced lung toxicity related to amiodarone vs eosinophilic pneumonia. Amiodarone has been discontinued. Pulmonology started prednisone with plan to taper over 1-2 months.  AFB smear negative and no malignant cells present in BAL fluid. Patient requires O2 at rest so will discharge with home O2.  Patient will follow up with pulmonology for management of steroid taper.  Could consider HRCT to confirm diagnosis of ILD.  She will follow up with pulmonology on 3/17.    Possible amyloidosis Given HFrEF, Afib, CKD, and anemia, concern for amyloidosis. Kappa and lambda light chains both elevated and ratio is wnl 0.64, representative of renal dysfunction (cleared renally). Immunofixation results showed polyclonal increase in IgG, likely indicating chronic infection or inflammatory disease or  recent immunization (received both COVID vaccines within the past month). Less likely amyloidosis given light chain ratio wnl and immunofixation without monoclonal spike. With significant weight loss over the past year, malignancy remains on differential.    Acute on Chronic Kidney Disease, Stage 3 Creatinine elevated with IV Lasix, then improved with PO lasix. She responded well to PO lasix with good urine output. Mg and K+ were repleted as needed.   Iron deficiency anemia.   Received IV Feraheme 2/18.  Started iron polysaccharides 150 mg daily per cardiology recs.   Deconditioning:   PT and OT recommended home health with 24 hour assistance/supervision.  Husband is concerned about caring for her at home alone, but patient does not  want to go to a SNF.  Will [facility] with [facility] PT and nursing aide. OT not available through patient's preferred [facility] agency at this time.       Increased nutrient needs related to chronic illness(CHF)  Dysphagia 3 Appreciate Nutrition recs and SLP recs.  Received Ensure and Prostat supplementation, as well as daily multivitamin.  Continued at discharge.    Discharge Vitals:   BP (!) 117/54 (BP Location: Right Arm)   Pulse 95   Temp (!) 97.4 F (36.3 C) (Oral)   Resp 17   Ht _0  (1.549 m)   Wt 54.9 kg   LMP  (LMP Unknown)   SpO2 94%   BMI 22.87  kg/m   Pertinent Labs  BMP Latest Ref Rng & Units 12/04/2019 12/02/2019 12/01/2019  Glucose 70 - 99 mg/dL 99 94 98  BUN 8 - 23 mg/dL 38(H) 47(H) 54(H)  Creatinine 0.44 - 1.00 mg/dL 1.10(H) 1.40(H) 1.56(H)  BUN/Creat Ratio 12 - 28 - - -  Sodium 135 - 145 mmol/L 137 138 134(L)  Potassium 3.5 - 5.1 mmol/L 3.4(L) 3.8 4.2  Chloride 98 - 111 mmol/L 95(L) 101 97(L)  CO2 22 - 32 mmol/L _0 Calcium 8.9 - 10.3 mg/dL 9.4 9.4 9.4   CBC Latest Ref Rng & Units 11/29/2019 11/28/2019 11/27/2019  WBC 4.0 - 10.5 K/uL 10.0 12.0(H) 13.0(H)  Hemoglobin 12.0 - 15.0 g/dL 9.4(L) 9.5(L) 10.1(L)  Hematocrit 36.0 - 46.0 % 32.3(L) 32.1(L) 34.2(L)  Platelets 150 - 400 K/uL 341 320 320   BAL fluid 2/24   Cell count: Specimen Description BRONCHIAL ALVEOLAR LAVAGE RML   Special Requests NONE   Gram Stain RARE WBC PRESENT, PREDOMINANTLY MONONUCLEAR  NO ORGANISMS SEEN     Fluid Type-FCT   BROCHIAL ALVEOLAR LAVAGE VC   Comment: CORRECTED ON 02/24 AT 1343: PREVIOUSLY REPORTED AS Bronch Lavag  Color, Fluid YELLOW YELLOWAbnormal    Appearance, Fluid CLEAR HAZYAbnormal    Total Nucleated Cell Count, Fluid 0 - 1,000 cu mm 86   Neutrophil Count, Fluid 0 - 25 % 25   Lymphs, Fluid % 5   Monocyte-Macrophage-Serous Fluid 50 - 90 % 49Low    Eos, Fluid % 21   Other Cells, Fluid % PIGMENT LADEN MACROPHAGES SEEN.     Culture:   Specimen Description BRONCHIAL ALVEOLAR  LAVAGE RML   Special Requests NONE   Gram Stain RARE WBC PRESENT, PREDOMINANTLY MONONUCLEAR  NO ORGANISMS SEEN   Culture CULTURE REINCUBATED FOR BETTER GROWTH  Performed at [facility], [place] [address] [place], [place] [address]   Report Status PENDING     Pertinent Studies and Procedures:   Echo 11/22/2019 IMPRESSIONS   1. Left ventricular ejection fraction, by estimation, is 35 to 40%. The  left ventricle has moderately decreased function. The left ventricle  demonstrates global hypokinesis. Left ventricular diastolic function could  not be evaluated.   2. Right ventricular systolic function is moderately reduced. The right  ventricular size is normal. There is mildly elevated pulmonary artery  systolic pressure. The estimated right ventricular systolic pressure is  25.0 mmHg.   3. Left atrial size was severely dilated.   4. Right atrial size was severely dilated.   5. The mitral valve is normal in structure and function. Mild to moderate  mitral valve regurgitation.   6. The aortic valve is tricuspid. Aortic valve regurgitation is moderate.  Mild to moderate aortic valve sclerosis/calcification is present, without  any evidence of aortic stenosis.   7. The inferior vena cava is normal in size with <50% respiratory  variability, suggesting right atrial pressure of 8 mmHg.  CTA 11/27/2019 IMPRESSION: -  No evidence of acute pulmonary embolism. -  Bilateral consolidative and ground-glass opacities. This may reflect edema or multifocal pneumonia. Small right pleural effusion. -  Cardiomegaly. -  Aortic Atherosclerosis (ICD10-I70.0).  CXR 11/29/2019 IMPRESSION: Cardiomegaly. Extensive patchy hazy opacities throughout both lungs, most prominent in the right greater than left mid lungs, slightly worsened, compatible with multilobar pneumonia versus pulmonary edema.  CXR, portable 12/01/2019 IMPRESSION: Worsened bilateral interstitial opacities, pulmonary edema  versus atypical infection.  Bronchoscopy and BAL 2/24 FINDINGS:  Oropharynx, esophagus, vocal cords, subglottic space,  trachea all appear normal.  Carina is sharp.  The tracheobronchial tree was examined to at least the first subsegmental level.  Bronchial mucosa and antomy are normal; there are no endobronchial lesions and no secretions   BAL was performed ni the RML medial segment (b5) of th elung and sent for cell count, bacterial culture, viral smears and culture, and fungal & AFB analysis and cytology.  120 mL of fluid were instilled.  20 mL returned.  The return was cloudy.  There were no mucoid plugs in the return fluid.    IMPRESSION:  Bilateral infiltrate.  Airway examination was normal.      Discharge Instructions: Discharge Instructions     (HEART FAILURE PATIENTS) Call MD:  Anytime you have any of the following symptoms: 1) 3 pound weight gain in 24 hours or 5 pounds in 1 week 2) shortness of breath, with or without a dry hacking cough 3) swelling in the hands, feet or stomach 4) if you have to sleep on extra pillows at night in order to breathe.   Complete by: As directed    Call MD for:  difficulty breathing, headache or visual disturbances   Complete by: As directed    Call MD for:  extreme fatigue   Complete by: As directed    Call MD for:  persistant dizziness or light-headedness   Complete by: As directed    Call MD for:  temperature >100.4   Complete by: As directed    Diet - low sodium heart healthy   Complete by: As directed    Increase activity slowly   Complete by: As directed        Signed: Lars Mage, MD 12/08/2019, 2:45 PM

## 2019-12-04 NOTE — Progress Notes (Signed)
NAME:  Teresa York, MRN:  [id number], DOB:  January 09, 1941, LOS: 64 ADMISSION DATE:  11/22/2019, CONSULTATION DATE:  12/04/2019  REFERRING MD:  Oda Kilts, MD, CHIEF COMPLAINT:  "amiodarone toxicity?"   History of present illness   This is a an 79 year old woman with a past medical history of chronic atrial fibrillation, heart failure reduced ejection fraction, and thromboembolic stroke in 7829.  Both she and her husband note that she has been having chronic issues with dyspnea on exertion for probably the past 15 years.  At least the last 5 to 7 years she has been severely debilitated and does not walk around much and usually immediately needs to rest of the public.  She has been especially debilitated since her stroke in 2018.  She has a diagnosis of sleep apnea both central and obstructive but has never been able to tolerate CPAP.  She also has Barrett's esophagus and has chronic cough which is mostly only at nighttime and she sleeps on 3 pillows at home.  He also is on a PPI for GERD.  She has been on amiodarone since 2018 for refractory A. fib and has had multiple cardioversions.  She has also been on anticoagulation but has had difficulty with frequent falls and several significant adverse effects of bleeding.  He is currently on apixaban.  Is been here for the last week trying to get her heart rate under better control and has subsequently been diuresed about 5 L.  There is a plan for a DCCV on Monday 2/22, and her amiodarone dose has been increased.  Pulmonary is being consulted to see if the hypoxemia and CT changes she has could be explained by amiodarone pulmonary toxicity.  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   Consults:  PCCM, cardiology  Procedures:    Significant Diagnostic Tests:   2/19 CT angio chest Shows patchy groundglass opacities with lower lobe predominance of small right-sided pleural effusion.  She also has some reactive lymphadenopathy in the hila and mediastinum.  2/14 echocardiogram 1. Left ventricular ejection fraction, by estimation, is 35 to 40%. The  left ventricle has moderately decreased function. The left ventricle  demonstrates global hypokinesis. Left ventricular diastolic function could  not be evaluated.  2. Right ventricular systolic function is moderately reduced. The right  ventricular size is normal. There is mildly elevated pulmonary artery  systolic pressure. The estimated right ventricular systolic pressure is  56.2 mmHg.  3. Left atrial size was severely dilated.  4. Right atrial size was severely dilated.  5. The mitral valve is normal in structure and function. Mild to moderate  mitral valve regurgitation.  6. The aortic valve is tricuspid. Aortic valve regurgitation is moderate.  Mild to moderate aortic valve sclerosis/calcification is present, without  any evidence of aortic stenosis.  7. The inferior vena cava is normal in size with <50% respiratory  variability, suggesting right atrial pressure of 8 mmHg.   Micro Data:  2/14 Covid negative  Antimicrobials:   Subjective/24 hours:  Feels her breathing is a little better. Denies chest pain,  palpitations.  Objective   Blood pressure (!) 117/54, pulse 95, temperature (!) 97.4 F (36.3 C), temperature source Oral, resp. rate 17, height _0  (1.549 m), weight 54.9 kg,  SpO2 94 %.        Intake/Output Summary (Last 24 hours) at 12/04/2019 1124 Last data filed at 12/04/2019 0735 Gross per 24 hour  Intake 560 ml  Output 1200 ml  Net -640 ml   Filed Weights   12/02/19 0439 12/03/19 0611 12/04/19 0343  Weight: 57.8 kg 56.5 kg 54.9 kg   Physical Exam: General: Elderly, frail-appearing, no acute distress HENT: [place], AT, OP clear, MMM Eyes: EOMI, no scleral icterus Respiratory: Clear to auscultation bilaterally.  No crackles, wheezing or rales Cardiovascular: Irregularly irregular, -M/R/G, no JVD Extremities:-Edema,-tenderness Neuro: AAO x4, CNII-XII grossly intact  Assessment & Plan:  Acute hypoxemic respiratory failure secondary amiodarone pulmonary toxicity Atrial fibrillation   79 year old female with significant history of atrial fibrillation refractory to multiple cardioversion attempts, hx thromboembolic stroke likely secondary to atrial fibrillation on anticoagulation, Barrett's esophagus with chronic reflux and cough. Pulmonary consulted to consider amiodarone toxicity in setting of worsening respiratory failure.  Patient s/p DCCV on 2/22. She underwent bronchoscopy yesterday and BAL demonstrated 21% eosinophils. Based on these results I am concerned for drug-induced lung toxicity related to amiodarone vs acute eosinophilic pneumonia. Discussed plan with Cardiology team who plan to stop amiodarone anyway since her cardiac rhythm has returned to atrial fibrillation. I have discussed with patient risks and benefits of glucocorticoid therapy. She does currently require oxygen for her symptoms so I would recommend initiating steroids with plan to taper over the next 1-2 months.  Plan --Discontinue amiodarone --Continue prednisone 40 mg daily.  --Wean supplemental oxygen for goal >88%. Will need ambulatory O2 prior to discharge for home O2 needs --Follow-up arranged with Dr. Shearon Stalls in Pulmonary clinic to prednisone titration  Pulmonary team will sign  off. Please contact on-call pager for any questions or concerns over the weekend.  Rodman Pickle, M.D. [facility] 12/04/2019 11:44 AM    Labs   CBC: Recent Labs  Lab 11/28/19 0722 11/29/19 0407  WBC 12.0* 10.0  HGB 9.5* 9.4*  HCT 32.1* 32.3*  MCV 75.2* 77.1*  PLT 320 503    Basic Metabolic Panel: Recent Labs  Lab 11/29/19 0407 11/30/19 0245 12/01/19 0719 12/02/19 0709 12/04/19 0526  NA 133* 135 134* 138 137  K 3.6 3.9 4.2 3.8 3.4*  CL 96* 96* 97* 101 95*  CO2 _1 GLUCOSE 105* 103* 98 94 99  BUN 39* 45* 54* 47* 38*  CREATININE 1.25* 1.28* 1.56* 1.40* 1.10*  CALCIUM 9.2 9.4 9.4 9.4 9.4  MG 2.0 1.8 2.4 1.9 1.5*   GFR: Estimated Creatinine Clearance: 30.8 mL/min (A) (by C-G formula based on SCr of 1.1 mg/dL (H)). Recent Labs  Lab 11/28/19 0722 11/29/19 0407  WBC 12.0* 10.0    Liver Function Tests: No results for input(s): AST, ALT, ALKPHOS, BILITOT, PROT, ALBUMIN in the last 168 hours. No results for input(s): LIPASE, AMYLASE in the last 168 hours. No results for input(s): AMMONIA in the last 168 hours.  ABG    Component Value Date/Time   PHART 7.474 (H) 11/24/2019 1754   PCO2ART 31.7 (L) 11/24/2019 1754   PO2ART 100 11/24/2019 1754   HCO3 23.1 11/24/2019 1754   TCO2 27 11/13/2017 2211   ACIDBASEDEF 0.1 11/24/2019 1754   O2SAT 98.2 11/24/2019 1754  Coagulation Profile: Recent Labs  Lab 11/27/19 1952  INR 1.7*    Cardiac Enzymes: No results for input(s): CKTOTAL, CKMB, CKMBINDEX, TROPONINI in the last 168 hours.  HbA1C: Hgb A1c MFr Bld  Date/Time Value Ref Range Status  11/14/2017 05:00 AM 4.8 4.8 - 5.6 % Final    Comment:    (NOTE) Pre diabetes:          5.7%-6.4% Diabetes:              >6.4% Glycemic control for   <7.0% adults with diabetes     CBG: No results for input(s): GLUCAP in the last 168 hours.

## 2019-12-04 NOTE — Progress Notes (Signed)
SATURATION QUALIFICATIONS: (This note is used to comply with regulatory documentation for home oxygen)  Patient Saturations on Room Air at Rest = 88%  Patient Saturations on Room Air while Ambulating = 85%  Patient Saturations on 2 Liters of oxygen while Ambulating = 96%  Please briefly explain why patient needs home oxygen:

## 2019-12-04 NOTE — Progress Notes (Signed)
Physical Therapy Treatment Patient Details Name: Teresa York MRN: [id number] DOB: 1941-07-01 Today's Date: 12/04/2019    History of Present Illness Pt is a 79 y.o. F with significant PMH of stroke, afib, CHF, who presents with SOB, elevated HR, and hypokalemia. Received IV lasix in ED. S/P cardioversion 2/16, but now back in afib with plan to pursue rate control per notes.    PT Comments    On arrival to room pt initially refusing PT. With encouragement pt agreed to participate to practice for her return home today. Pt was able to sit EOB and don socks and shirt with supervision for safety. Assist provided for LB dressing. Pt ambulated 24ft on RA before SpO2 dropped to 81%. She was able to recover to 95% with a seated rest brake and cues for pursed lipped breathing. Pt was placed back on 2L for remainder of session. Spoke at length with husband about safe plan for pt to progress from the car to the house. She will need O2 for the distance she needs to walk and husband indicated they do not currently have O2 available. RN notified of pt need. Pt would benefit from HHPT at d/c to maximize functional independence, safety with mobility, and activity tolerance.    Follow Up Recommendations  Supervision/Assistance - 24 hour;Home health PT     Equipment Recommendations  None recommended by PT(has DME)    Recommendations for Other Services       Precautions / Restrictions Precautions Precautions: Fall Precaution Comments: watch HR and O2 Restrictions Weight Bearing Restrictions: No    Mobility  Bed Mobility Overal bed mobility: Needs Assistance Bed Mobility: Supine to Sit     Supine to sit: Min guard;HOB elevated     General bed mobility comments: min guard for safety. Increased time and effort with use of rails and HOB elevated  Transfers Overall transfer level: Needs assistance Equipment used: Rolling walker (2 wheeled) Transfers: Sit to/from Stand Sit to Stand: Min guard          General transfer comment: min guard for safety.   Ambulation/Gait Ambulation/Gait assistance: Min assist Gait Distance (Feet): 20 Feet(67ft x2; 1ft x1) Assistive device: 4-wheeled walker Gait Pattern/deviations: Step-through pattern;Decreased stride length;Trunk flexed Gait velocity: decreased   General Gait Details: Pt ambulated 5 ft in room on RA before requiring a seated rest break. SpO2 read 81% but returned quickly to 95% on RA with cues for PLB. Pt placed bakc on 2 L of O2 for remainder of sesson. In hallway she required 1 seated rest break secondary to SOB.    Stairs             Wheelchair Mobility    Modified Rankin (Stroke Patients Only)       Balance Overall balance assessment: Needs assistance Sitting-balance support: No upper extremity supported;Feet supported Sitting balance-Leahy Scale: Good     Standing balance support: During functional activity;Single extremity supported Standing balance-Leahy Scale: Fair Standing balance comment: pt able to pull up Depends with single UE support and min guard                            Cognition Arousal/Alertness: Awake/alert Behavior During Therapy: WFL for tasks assessed/performed Overall Cognitive Status: Impaired/Different from baseline                                 General Comments: Husband present  and reports wife has been having hallucinations of people riding bikes on the room and walruses climbing up the poles outside. However at the time of treatment pt appeared lucid.       Exercises      General Comments General comments (skin integrity, edema, etc.): Spoke with husband about plan for getting pt from car to the house safely.       Pertinent Vitals/Pain Pain Assessment: No/denies pain    Home Living                      Prior Function            PT Goals (current goals can now be found in the care plan section) Acute Rehab PT Goals Patient Stated  Goal: to feel better PT Goal Formulation: With patient Time For Goal Achievement: 12/06/19 Potential to Achieve Goals: Good    Frequency    Min 3X/week      PT Plan Current plan remains appropriate    Co-evaluation              AM-PAC PT "6 Clicks" Mobility   Outcome Measure  Help needed turning from your back to your side while in a flat bed without using bedrails?: None Help needed moving from lying on your back to sitting on the side of a flat bed without using bedrails?: A Little Help needed moving to and from a bed to a chair (including a wheelchair)?: None Help needed standing up from a chair using your arms (e.g., wheelchair or bedside chair)?: None Help needed to walk in hospital room?: A Little Help needed climbing 3-5 steps with a railing? : A Little 6 Click Score: 21    End of Session Equipment Utilized During Treatment: Gait belt;Oxygen Activity Tolerance: Patient limited by fatigue;Other (comment)(drop in O2) Patient left: in chair;with call bell/phone within reach;with family/visitor present Nurse Communication: Mobility status PT Visit Diagnosis: Unsteadiness on feet (R26.81);Muscle weakness (generalized) (M62.81);Difficulty in walking, not elsewhere classified (R26.2)     Time: [id number] PT Time Calculation (min) (ACUTE ONLY): 50 min  Charges:  $Gait Training: 23-37 mins $Therapeutic Activity: 8-22 mins                    Benjiman Core, [place] Pager [phone] Acute Rehab   Allena Katz 12/04/2019, 2:52 PM

## 2019-12-05 LAB — URINE CULTURE: Special Requests: NORMAL

## 2019-12-07 DIAGNOSIS — J9601 Acute respiratory failure with hypoxia: Secondary | ICD-10-CM | POA: Diagnosis not present

## 2019-12-07 DIAGNOSIS — I5042 Chronic combined systolic (congestive) and diastolic (congestive) heart failure: Secondary | ICD-10-CM | POA: Diagnosis not present

## 2019-12-07 DIAGNOSIS — N189 Chronic kidney disease, unspecified: Secondary | ICD-10-CM | POA: Diagnosis not present

## 2019-12-07 DIAGNOSIS — I4891 Unspecified atrial fibrillation: Secondary | ICD-10-CM | POA: Diagnosis not present

## 2019-12-07 DIAGNOSIS — I10 Essential (primary) hypertension: Secondary | ICD-10-CM | POA: Diagnosis not present

## 2019-12-07 DIAGNOSIS — E039 Hypothyroidism, unspecified: Secondary | ICD-10-CM | POA: Diagnosis not present

## 2019-12-07 DIAGNOSIS — K219 Gastro-esophageal reflux disease without esophagitis: Secondary | ICD-10-CM | POA: Diagnosis not present

## 2019-12-07 NOTE — Telephone Encounter (Signed)
Pt discharged home with [facility].  Follow up scheduled with PCP (who will follow [place] orders)  Follow up with cardiology scheduled.  No further action needed at this time.

## 2019-12-07 NOTE — Telephone Encounter (Signed)
I am happy to do it

## 2019-12-10 ENCOUNTER — Emergency Department (HOSPITAL_COMMUNITY)
Admission: EM | Admit: 2019-12-10 | Discharge: 2019-12-10 | Disposition: A | Discharge status: Home / Self Care | Payer: PPO | Attending: Emergency Medicine | Admitting: Emergency Medicine

## 2019-12-10 ENCOUNTER — Emergency Department (HOSPITAL_COMMUNITY): Payer: PPO

## 2019-12-10 DIAGNOSIS — M25552 Pain in left hip: Secondary | ICD-10-CM | POA: Diagnosis not present

## 2019-12-10 DIAGNOSIS — Y999 Unspecified external cause status: Secondary | ICD-10-CM | POA: Insufficient documentation

## 2019-12-10 DIAGNOSIS — E039 Hypothyroidism, unspecified: Secondary | ICD-10-CM | POA: Diagnosis not present

## 2019-12-10 DIAGNOSIS — I4891 Unspecified atrial fibrillation: Secondary | ICD-10-CM | POA: Insufficient documentation

## 2019-12-10 DIAGNOSIS — W19XXXA Unspecified fall, initial encounter: Secondary | ICD-10-CM | POA: Insufficient documentation

## 2019-12-10 DIAGNOSIS — Z853 Personal history of malignant neoplasm of breast: Secondary | ICD-10-CM | POA: Diagnosis not present

## 2019-12-10 DIAGNOSIS — Y939 Activity, unspecified: Secondary | ICD-10-CM | POA: Diagnosis not present

## 2019-12-10 DIAGNOSIS — Z7901 Long term (current) use of anticoagulants: Secondary | ICD-10-CM | POA: Diagnosis not present

## 2019-12-10 DIAGNOSIS — Y929 Unspecified place or not applicable: Secondary | ICD-10-CM | POA: Diagnosis not present

## 2019-12-10 DIAGNOSIS — R000 Tachycardia, unspecified: Secondary | ICD-10-CM | POA: Diagnosis not present

## 2019-12-10 DIAGNOSIS — Z79899 Other long term (current) drug therapy: Secondary | ICD-10-CM | POA: Diagnosis not present

## 2019-12-10 DIAGNOSIS — I5022 Chronic systolic (congestive) heart failure: Secondary | ICD-10-CM | POA: Diagnosis not present

## 2019-12-10 DIAGNOSIS — M79622 Pain in left upper arm: Secondary | ICD-10-CM | POA: Insufficient documentation

## 2019-12-10 DIAGNOSIS — I130 Hypertensive heart and chronic kidney disease with heart failure and stage 1 through stage 4 chronic kidney disease, or unspecified chronic kidney disease: Secondary | ICD-10-CM | POA: Diagnosis not present

## 2019-12-10 DIAGNOSIS — S79912A Unspecified injury of left hip, initial encounter: Secondary | ICD-10-CM | POA: Diagnosis not present

## 2019-12-10 DIAGNOSIS — S42352A Displaced comminuted fracture of shaft of humerus, left arm, initial encounter for closed fracture: Secondary | ICD-10-CM | POA: Diagnosis not present

## 2019-12-10 DIAGNOSIS — R42 Dizziness and giddiness: Secondary | ICD-10-CM | POA: Diagnosis not present

## 2019-12-10 DIAGNOSIS — R52 Pain, unspecified: Secondary | ICD-10-CM | POA: Diagnosis not present

## 2019-12-10 DIAGNOSIS — N183 Chronic kidney disease, stage 3 unspecified: Secondary | ICD-10-CM | POA: Insufficient documentation

## 2019-12-10 DIAGNOSIS — S42292A Other displaced fracture of upper end of left humerus, initial encounter for closed fracture: Secondary | ICD-10-CM | POA: Insufficient documentation

## 2019-12-10 DIAGNOSIS — R0902 Hypoxemia: Secondary | ICD-10-CM | POA: Diagnosis not present

## 2019-12-10 DIAGNOSIS — S42202A Unspecified fracture of upper end of left humerus, initial encounter for closed fracture: Secondary | ICD-10-CM | POA: Diagnosis not present

## 2019-12-10 DIAGNOSIS — S4992XA Unspecified injury of left shoulder and upper arm, initial encounter: Secondary | ICD-10-CM | POA: Diagnosis present

## 2019-12-10 DIAGNOSIS — M25512 Pain in left shoulder: Secondary | ICD-10-CM | POA: Diagnosis not present

## 2019-12-10 MED ORDER — OXYCODONE-ACETAMINOPHEN 5-325 MG PO TABS: 1.0000 | ORAL_TABLET | Freq: Four times a day (QID) | ORAL | 0 refills | Status: AC | PRN

## 2019-12-10 MED ORDER — MORPHINE SULFATE (PF) 4 MG/ML IV SOLN
4.0000 mg | Freq: Once | INTRAVENOUS | Status: AC
  Administered 2019-12-10: 4 mg via INTRAVENOUS
  Filled 2019-12-10: qty 1

## 2019-12-10 NOTE — Discharge Instructions (Addendum)
You will need to follow-up with your orthopedist or the one provided.  Return here as needed.

## 2019-12-10 NOTE — ED Provider Notes (Signed)
[place] EMERGENCY DEPARTMENT Provider Note   CSN: [id number] Arrival date & time: 12/10/19  1744     History Chief Complaint  Patient presents with  . Fall  . Atrial Fibrillation    Teresa York is a 79 y.o. female.  HPI Patient presents to the emergency department with injuries following a fall.  Patient states that she fell this morning and has pain to the left shoulder.  The patient states that nothing seems to make the condition better but movements palpation make the pain worse.  Patient states that she stopped taking all her medications and not want any treatment for any of her medical conditions at this time.  Patient states that she needs some pain control.  I spoke with her primary care doctor who went to her house to see her and sent her in for x-rays.  The patient denies chest pain, shortness of breath, headache,blurred vision, neck pain, fever, cough, weakness, numbness, dizziness, anorexia, edema, abdominal pain, nausea, vomiting, diarrhea, rash, back pain, dysuria, hematemesis, bloody stool, near syncope, or syncope.  Past Medical History:  Diagnosis Date  . Allergic rhinitis   . Anxiety   . Atrial fibrillation ([place])   . Barrett's esophagus   . Breast cancer ([place]) 2005   "left"  . Chronic lower back pain   . Chronic systolic CHF (congestive heart failure) (HCC)    a. EF 30-35% felt to be possibly be tachy mediated from afib wtih RVR  . Depression   . Diverticulosis   . GERD (gastroesophageal reflux disease)   . Hiatal hernia   . Hx of radiation therapy 2005  . Hypertension   . Hypothyroidism   . Left atrial enlargement   . 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 Mitral regurgitation   . OAB (overactive bladder)   . Persistent atrial fibrillation ([place]) 10/31/2016   a. s/p failed DCCV x3. Now on amiodarone.   . Sleep apnea   . 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  Stroke [facility])     Patient Active Problem List   Diagnosis Date Noted  . Pressure injury of skin 11/27/2019  . CKD (chronic kidney disease) stage 3, GFR 30-59 ml/min 11/24/2019  . Atrial fibrillation ([place])   . Acute HFrEF (heart failure with reduced ejection fraction) ([place]) 11/23/2019  . Acute on chronic HFrEF (heart failure with reduced ejection fraction) ([place]) 11/23/2019  . Chest tightness 11/22/2019  . Persistent atrial fibrillation ([place])   . Hypertension   . Hypothyroidism   . Aortic valve regurgitation 10/31/2016  . Atrial fibrillation with RVR ([place]) 10/31/2016  . Barrett's esophagus without dysplasia 10/27/2015  . Dysphagia 10/27/2015  . Loss of weight 10/27/2015  . Chronic anticoagulation 10/27/2015    Past Surgical History:  Procedure Laterality Date  . BACK SURGERY    . BREAST BIOPSY Left 2005  . BREAST LUMPECTOMY Left 2005  . BRONCHIAL WASHINGS  12/02/2019   Procedure: BRONCHIAL WASHINGS;  Surgeon: Margaretha Seeds, MD;  Location: [place];  Service: Pulmonary;;  . CARDIOVERSION N/A 11/02/2016   Procedure: CARDIOVERSION;  Surgeon: Thayer Headings, MD;  Location: [place];  Service: Cardiovascular;  Laterality: N/A;  . CARDIOVERSION N/A 11/16/2016   Procedure: CARDIOVERSION;  Surgeon: Fay Records, MD;  Location: [facility] ENDOSCOPY;  Service: Cardiovascular;  Laterality: N/A;  . CARDIOVERSION N/A 12/24/2016   Procedure: CARDIOVERSION;  Surgeon: Sanda Klein, MD;  Location: [facility]  ENDOSCOPY;  Service: Cardiovascular;  Laterality: N/A;  . CARDIOVERSION N/A 11/24/2019   Procedure: CARDIOVERSION;  Surgeon: Buford Dresser, MD;  Location: [facility] ENDOSCOPY;  Service: Cardiovascular;  Laterality: N/A;  . CARDIOVERSION N/A 11/30/2019   Procedure: CARDIOVERSION;  Surgeon: Josue Hector, MD;  Location: [place];  Service: Cardiovascular;  Laterality: N/A;  . CARPAL TUNNEL RELEASE Bilateral 1995  . CHOLECYSTECTOMY  08/2018  . COLONOSCOPY    . ELBOW FRACTURE SURGERY Right 2009    with implant  . ESOPHAGOGASTRODUODENOSCOPY    . ESOPHAGUS SURGERY     "in the process of getting cancerous cell off my esophagus" (10/31/2016)  . FRACTURE SURGERY    . IR CT HEAD LTD  11/14/2017  . IR PERCUTANEOUS ART THROMBECTOMY/INFUSION INTRACRANIAL INC DIAG ANGIO  11/14/2017  . LUMBAR DISC SURGERY     L5  . MOHS SURGERY     "corner of right eye; on left cheek"  . RADIOLOGY WITH ANESTHESIA N/A 11/13/2017   Procedure: IR WITH ANESTHESIA;  Surgeon: Radiologist, Medication, MD;  Location: [place];  Service: Radiology;  Laterality: N/A;  . TEE WITHOUT CARDIOVERSION N/A 11/16/2016   Procedure: TRANSESOPHAGEAL ECHOCARDIOGRAM (TEE);  Surgeon: Fay Records, MD;  Location: [place];  Service: Cardiovascular;  Laterality: N/A;  . TONSILLECTOMY    . TUBAL LIGATION  1980  . VIDEO BRONCHOSCOPY N/A 12/02/2019   Procedure: VIDEO BRONCHOSCOPY WITHOUT FLUORO;  Surgeon: Margaretha Seeds, MD;  Location: [place];  Service: Pulmonary;  Laterality: N/A;     OB History   No obstetric history on file.     Family History  Problem Relation Age of Onset  . Heart attack Father 95  . Congestive Heart Failure Father   . Thyroid cancer Sister   . Breast cancer Daughter   . Bone cancer Daughter        Ewing sarcoma  . Colon cancer Neg Hx   . Esophageal cancer Neg Hx   . Stomach cancer Neg Hx     Social History   Tobacco Use  . Smoking status: Never Smoker  . Smokeless tobacco: Never Used  Substance Use Topics  . Alcohol use: Yes    Alcohol/week: 0.0 standard drinks    Comment: 1-2 per week  . Drug use: No    Home Medications Prior to Admission medications   Medication Sig Start Date End Date Taking? Authorizing Provider  Amino Acids-Protein Hydrolys (FEEDING SUPPLEMENT, PRO-STAT SUGAR FREE 64,) LIQD Take 30 mLs by mouth 2 (two) times daily. 12/04/19  Yes Chundi, Vahini, MD  apixaban (ELIQUIS) 5 MG TABS tablet Take 5 mg by mouth 2 (two) times daily.   Yes [provider]  b complex  vitamins tablet Take 1 tablet by mouth daily.   Yes [provider]  cetirizine (ZYRTEC) 10 MG tablet Take 10 mg by mouth daily. Reported on 10/27/2015   Yes [provider]  Cholecalciferol (D 1000) 1000 units capsule Take 1,000 Units by mouth daily. Reported on 11/03/2015 11/30/14  Yes [provider]  cholecalciferol (VITAMIN D) 25 MCG tablet Take 1 tablet (1,000 Units total) by mouth daily. 12/05/19  Yes Chundi, Vahini, MD  diphenhydramine-acetaminophen (TYLENOL PM) 25-500 MG TABS tablet Take 1 tablet by mouth at bedtime as needed (sleep).   Yes [provider]  feeding supplement, ENSURE ENLIVE, (ENSURE ENLIVE) LIQD Take 237 mLs by mouth at bedtime. 12/04/19  Yes Chundi, Vahini, MD  FLUoxetine (PROZAC) 40 MG capsule Take 40 mg by mouth daily.  Yes [provider]  fluticasone (FLONASE) 50 MCG/ACT nasal spray Place 1 spray into both nostrils daily as needed for allergies.  12/27/17  Yes [provider]  furosemide (LASIX) 40 MG tablet Take 1 tablet (40 mg total) by mouth 2 (two) times daily. 12/04/19  Yes Chundi, Vahini, MD  iron polysaccharides (NIFEREX) 150 MG capsule Take 1 capsule (150 mg total) by mouth daily. 12/05/19  Yes Chundi, Vahini, MD  latanoprost (XALATAN) 0.005 % ophthalmic solution Place 1 drop into both eyes at bedtime. 11/12/19  Yes [provider]  levothyroxine (SYNTHROID) 175 MCG tablet Take 175 mcg by mouth daily before breakfast.    Yes [provider]  Magnesium Oxide (MAGOX 400 PO) Take 400 mg by mouth daily.    Yes [provider]  meclizine (ANTIVERT) 25 MG tablet Take 25 mg by mouth 3 (three) times daily as needed for dizziness or nausea.  06/02/18  Yes [provider]  metoprolol tartrate (LOPRESSOR) 25 MG tablet Take 1 tablet (25 mg total) by mouth 2 (two) times daily. 12/04/19  Yes Chundi, Vahini, MD  Multiple Vitamins-Minerals (ICAPS) CAPS Take 1 capsule by mouth 2 (two) times daily.     Yes [provider]  omeprazole (PRILOSEC) 40 MG capsule Take 40 mg by mouth at bedtime.    Yes [provider]  ondansetron (ZOFRAN) 4 MG tablet Take 4 mg by mouth every 8 (eight) hours as needed for nausea or vomiting.  06/02/18  Yes [provider]  potassium chloride (KLOR-CON) 10 MEQ tablet Take 10 mEq by mouth daily.   Yes [provider]  predniSONE (DELTASONE) 20 MG tablet Take 2 tablets (40 mg total) by mouth daily with breakfast. 12/05/19  Yes Chundi, Vahini, MD  Probiotic Product (DAILY PROBIOTIC) CAPS Take 1 tablet by mouth daily.   Yes [provider]  spironolactone (ALDACTONE) 50 MG tablet Take 50 mg by mouth daily as needed (ankle swelling).   Yes [provider]  traZODone (DESYREL) 150 MG tablet Take 75 mg by mouth at bedtime. 02/06/18  Yes [provider]    Allergies    Benazepril and Diltiazem hcl  Review of Systems   Review of Systems All other systems negative except as documented in the HPI. All pertinent positives and negatives as reviewed in the HPI. Physical Exam Updated Vital Signs BP 110/69   Pulse (!) 123   Temp (!) 96.7 F (35.9 C) (Oral)   Resp 15   LMP  (LMP Unknown)   SpO2 98%   Physical Exam Vitals and nursing note reviewed.  Constitutional:      General: She is not in acute distress.    Appearance: She is well-developed.  HENT:     Head: Normocephalic and atraumatic.  Eyes:     Pupils: Pupils are equal, round, and reactive to light.  Cardiovascular:     Rate and Rhythm: Tachycardia present. Rhythm regularly irregular.  Pulmonary:     Effort: Pulmonary effort is normal.     Breath sounds: Normal breath sounds.  Musculoskeletal:     Right shoulder: Tenderness present.     Right upper arm: Tenderness and bony tenderness present.       Arms:     Cervical back: Normal range of motion and neck supple.     Right hip: Tenderness present. Normal range of motion.       Legs:  Skin:     General: Skin is warm and dry.  Neurological:  Mental Status: She is alert and oriented to person, place, and time.     ED Results / Procedures / Treatments   Labs (all labs ordered are listed, but only abnormal results are displayed) Labs Reviewed - No data to display  EKG EKG Interpretation  Date/Time:  Thursday December 10 2019 18:04:33 EST Ventricular Rate:  130 PR Interval:    QRS Duration: 139 QT Interval:  383 QTC Calculation: 564 R Axis:   -78 Text Interpretation: Atrial fibrillation RBBB and LAFB LVH by voltage Prolonged QT interval Confirmed by Lacretia Leigh ([id number]) on 12/10/2019 6:40:58 PM   Radiology DG Shoulder Left  Result Date: 12/10/2019 CLINICAL DATA:  Left shoulder pain after fall today. Limited range of motion. EXAM: LEFT SHOULDER - 2+ VIEW COMPARISON:  None. FINDINGS: Left proximal humerus fracture is dignity acutely displaced with dominant fracture plane through the surgical neck. There is 1.8 cm superior migration of the humeral shaft with respect to the humeral head. Mild apex anterior angulation. Fracture is mildly comminuted. Mild acromioclavicular osteoarthritis. Bones are diffusely under mineralized. IMPRESSION: Displaced proximal humerus fracture with dominant fracture plane through the surgical neck. Superior migration of the humeral shaft of 1.8 cm. Electronically Signed   By: Keith Rake M.D.   On: 12/10/2019 19:34   DG Hip Unilat W or Wo Pelvis 2-3 Views Left  Result Date: 12/10/2019 CLINICAL DATA:  Fall today with left hip pain. EXAM: DG HIP (WITH OR WITHOUT PELVIS) 2-3V LEFT COMPARISON:  None. FINDINGS: The cortical margins of the bony pelvis and left hip are intact. No fracture. Pubic symphysis and sacroiliac joints are congruent. Both femoral heads are well-seated in the respective acetabula. Degenerative change of the sacroiliac joints and bilateral acetabula. Chondrocalcinosis of the pubic symphysis. Probable calcified uterine fibroid in the  midline pelvis. IMPRESSION: No fracture of the pelvis or left hip. Electronically Signed   By: Keith Rake M.D.   On: 12/10/2019 19:35    Procedures Procedures (including critical care time)  Medications Ordered in ED Medications  morphine 4 MG/ML injection 4 mg (4 mg Intravenous Given 12/10/19 1904)    ED Course  I have reviewed the triage vital signs and the nursing notes.  Pertinent labs & imaging results that were available during my care of the patient were reviewed by me and considered in my medical decision making (see chart for details).    MDM Rules/Calculators/A&P                      Patient does not want any treatment for her heart rate and atrial fibrillation.  The patient wants pain control and to be sent home.  I will place her in a sling and have her follow-up with her doctor as soon as possible. Final Clinical Impression(s) / ED Diagnoses Final diagnoses:  None    Rx / DC Orders ED Discharge Orders    None       Rebeca Allegra 12/10/19 2037    Lacretia Leigh, MD 12/11/19 2245

## 2019-12-10 NOTE — ED Triage Notes (Signed)
Pt BIB [facility] following a fall approx 1100 this am, c/o L sided shoulder pain, Left upper thigh/hip pain, able to stand per EMS. Pt hx stroke w/ left sided weakness @ baseline, Afib. Pt dut to be moved to hospice care tomorrow. Pt endorses LOC, dizziness prior to fall, denies neck, head pain, refused c-collar, per EMS. On Eliquis. Pt seen last week for Afib. HR 135 at this time

## 2019-12-10 NOTE — ED Provider Notes (Signed)
Medical screening examination/treatment/procedure(s) were conducted as a shared visit with non-physician practitioner(s) and myself.  I personally evaluated the patient during the encounter.  EKG Interpretation  Date/Time:  Thursday December 10 2019 18:04:33 EST Ventricular Rate:  130 PR Interval:    QRS Duration: 139 QT Interval:  383 QTC Calculation: 564 R Axis:   -78 Text Interpretation: Atrial fibrillation RBBB and LAFB LVH by voltage Prolonged QT interval Confirmed by Lacretia Leigh ([id number]) on 12/10/2019 6:55:27 PM 79 year old female here after mechanical fall.  X-ray shows proximal humerus fracture on the left.  Will place in sling.  Patient is hospice comfort care only.  Patient's heart rate noted but she does not want any medications to control it.  Spoke with the patient's physician, Dr. Zigmund Daniel, hospice will follow   Lacretia Leigh, MD 12/10/19 2027

## 2019-12-10 NOTE — Progress Notes (Deleted)
PCP:  Patrecia Pour, Christean Grief, MD Primary Cardiologist: Lauree Chandler, MD Electrophysiologist: {Blank single:19197::"Dr. Allred","Dr. Johnnette Litter. Camnitz"}   Teresa York is a 79 y.o. female with past medical history of *** who presents today for routine electrophysiology followup. They are seen for {Blank single:19197::"Dr. Allred","Dr. Arlan Organ. Klein","Dr. Camnitz"}.   Since last being seen in our clinic, the patient reports doing very well.    The patient feels that she is tolerating medications without difficulties and is otherwise without complaint today.   Past Medical History:  Diagnosis Date  . Allergic rhinitis   . Anxiety   . Atrial fibrillation ([place])   . Barrett's esophagus   . Breast cancer ([place]) 2005   "left"  . Chronic lower back pain   . Chronic systolic CHF (congestive heart failure) (HCC)    a. EF 30-35% felt to be possibly be tachy mediated from afib wtih RVR  . Depression   . Diverticulosis   . GERD (gastroesophageal reflux disease)   . Hiatal hernia   . Hx of radiation therapy 2005  . Hypertension   . Hypothyroidism   . Left atrial enlargement   . 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 Mitral regurgitation   . OAB (overactive bladder)   . Persistent atrial fibrillation ([place]) 10/31/2016   a. s/p failed DCCV x3. Now on amiodarone.   . Sleep apnea   . 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 Stroke [facility])    Past Surgical History:  Procedure Laterality Date  . BACK SURGERY    . BREAST BIOPSY Left 2005  . BREAST LUMPECTOMY Left 2005  . BRONCHIAL WASHINGS  12/02/2019   Procedure: BRONCHIAL WASHINGS;  Surgeon: Margaretha Seeds, MD;  Location: [place];  Service: Pulmonary;;  . CARDIOVERSION N/A 11/02/2016   Procedure: CARDIOVERSION;  Surgeon: Thayer Headings, MD;  Location: [place];  Service: Cardiovascular;  Laterality: N/A;  . CARDIOVERSION N/A 11/16/2016     Procedure: CARDIOVERSION;  Surgeon: Fay Records, MD;  Location: [facility] ENDOSCOPY;  Service: Cardiovascular;  Laterality: N/A;  . CARDIOVERSION N/A 12/24/2016   Procedure: CARDIOVERSION;  Surgeon: Sanda Klein, MD;  Location: [facility] ENDOSCOPY;  Service: Cardiovascular;  Laterality: N/A;  . CARDIOVERSION N/A 11/24/2019   Procedure: CARDIOVERSION;  Surgeon: Buford Dresser, MD;  Location: [facility] ENDOSCOPY;  Service: Cardiovascular;  Laterality: N/A;  . CARDIOVERSION N/A 11/30/2019   Procedure: CARDIOVERSION;  Surgeon: Josue Hector, MD;  Location: [place];  Service: Cardiovascular;  Laterality: N/A;  . CARPAL TUNNEL RELEASE Bilateral 1995  . CHOLECYSTECTOMY  08/2018  . COLONOSCOPY    . ELBOW FRACTURE SURGERY Right 2009   with implant  . ESOPHAGOGASTRODUODENOSCOPY    . ESOPHAGUS SURGERY     "in the process of getting cancerous cell off my esophagus" (10/31/2016)  . FRACTURE SURGERY    . IR CT HEAD LTD  11/14/2017  . IR PERCUTANEOUS ART THROMBECTOMY/INFUSION INTRACRANIAL INC DIAG ANGIO  11/14/2017  . LUMBAR DISC SURGERY     L5  . MOHS SURGERY     "corner of right eye; on left cheek"  . RADIOLOGY WITH ANESTHESIA N/A 11/13/2017   Procedure: IR WITH ANESTHESIA;  Surgeon: Radiologist, Medication, MD;  Location: [place];  Service: Radiology;  Laterality: N/A;  . TEE WITHOUT CARDIOVERSION N/A 11/16/2016   Procedure: TRANSESOPHAGEAL ECHOCARDIOGRAM (TEE);  Surgeon: Fay Records, MD;  Location: [place];  Service: Cardiovascular;  Laterality: N/A;  .  TONSILLECTOMY    . TUBAL LIGATION  1980  . VIDEO BRONCHOSCOPY N/A 12/02/2019   Procedure: VIDEO BRONCHOSCOPY WITHOUT FLUORO;  Surgeon: Margaretha Seeds, MD;  Location: [place];  Service: Pulmonary;  Laterality: N/A;    Current Outpatient Medications  Medication Sig Dispense Refill  . acetaminophen (TYLENOL) 325 MG tablet Take 650 mg by mouth 2 (two) times daily as needed for mild pain.     . Amino Acids-Protein Hydrolys (FEEDING SUPPLEMENT,  PRO-STAT SUGAR FREE 64,) LIQD Take 30 mLs by mouth 2 (two) times daily. 887 mL 5  . cetirizine (ZYRTEC) 10 MG tablet Take 10 mg by mouth daily. Reported on 10/27/2015    . Cholecalciferol (D 1000) 1000 units capsule Take 1,000 Units by mouth daily. Reported on 11/03/2015    . cholecalciferol (VITAMIN D) 25 MCG tablet Take 1 tablet (1,000 Units total) by mouth daily. 30 tablet 0  . diphenhydramine-acetaminophen (TYLENOL PM) 25-500 MG TABS tablet Take 1 tablet by mouth at bedtime as needed (sleep).    . feeding supplement, ENSURE ENLIVE, (ENSURE ENLIVE) LIQD Take 237 mLs by mouth at bedtime. 237 mL 12  . FLUoxetine (PROZAC) 40 MG capsule Take 40 mg by mouth daily.    . fluticasone (FLONASE) 50 MCG/ACT nasal spray Place 1 spray into both nostrils daily as needed for allergies.     . furosemide (LASIX) 40 MG tablet Take 1 tablet (40 mg total) by mouth 2 (two) times daily. 30 tablet 0  . iron polysaccharides (NIFEREX) 150 MG capsule Take 1 capsule (150 mg total) by mouth daily. 30 capsule 0  . latanoprost (XALATAN) 0.005 % ophthalmic solution Place 1 drop into both eyes at bedtime.    Marland Kitchen levothyroxine (SYNTHROID) 175 MCG tablet Take 175 mcg by mouth daily before breakfast.     . Magnesium Oxide (MAGOX 400 PO) Take 400 mg by mouth daily.     . meclizine (ANTIVERT) 25 MG tablet Take 25 mg by mouth 3 (three) times daily as needed for dizziness or nausea.     . metoprolol tartrate (LOPRESSOR) 25 MG tablet Take 1 tablet (25 mg total) by mouth 2 (two) times daily. 30 tablet 0  . Multiple Vitamins-Minerals (ICAPS) CAPS Take 1 capsule by mouth 2 (two) times daily.     Marland Kitchen omeprazole (PRILOSEC) 40 MG capsule Take 40 mg by mouth at bedtime.     . ondansetron (ZOFRAN) 4 MG tablet Take 4 mg by mouth every 8 (eight) hours as needed for nausea or vomiting.   1  . potassium chloride (KLOR-CON) 10 MEQ tablet Take 10 mEq by mouth daily.    . predniSONE (DELTASONE) 20 MG tablet Take 2 tablets (40 mg total) by mouth daily  with breakfast. 30 tablet 0  . Probiotic Product (DAILY PROBIOTIC) CAPS Take 1 tablet by mouth daily.    Marland Kitchen spironolactone (ALDACTONE) 50 MG tablet Take 50 mg by mouth daily as needed (ankle swelling).    . traZODone (DESYREL) 150 MG tablet Take 75 mg by mouth at bedtime.  0   No current facility-administered medications for this visit.    Allergies  Allergen Reactions  . Benazepril Cough    Cough  . Diltiazem Hcl Rash    Social History   Socioeconomic History  . Marital status: Married    Spouse name: Not on file  . Number of children: 2  . Years of education: Not on file  . Highest education level: Not on file  Occupational History  .  Occupation: Retired    Fish farm manager: RETIRED  Tobacco Use  . Smoking status: Never Smoker  . Smokeless tobacco: Never Used  Substance and Sexual Activity  . Alcohol use: Yes    Alcohol/week: 0.0 standard drinks    Comment: 1-2 per week  . Drug use: No  . Sexual activity: Not Currently  Other Topics Concern  . Not on file  Social History Narrative  . Not on file   Social Determinants of Health   Financial Resource Strain:   . Difficulty of Paying Living Expenses: Not on file  Food Insecurity:   . Worried About Charity fundraiser in the Last Year: Not on file  . Ran Out of Food in the Last Year: Not on file  Transportation Needs:   . Lack of Transportation (Medical): Not on file  . Lack of Transportation (Non-Medical): Not on file  Physical Activity:   . Days of Exercise per Week: Not on file  . Minutes of Exercise per Session: Not on file  Stress:   . Feeling of Stress : Not on file  Social Connections:   . Frequency of Communication with Friends and Family: Not on file  . Frequency of Social Gatherings with Friends and Family: Not on file  . Attends Religious Services: Not on file  . Active Member of Clubs or Organizations: Not on file  . Attends Archivist Meetings: Not on file  . Marital Status: Not on file    Intimate Partner Violence:   . Fear of Current or Ex-Partner: Not on file  . Emotionally Abused: Not on file  . Physically Abused: Not on file  . Sexually Abused: Not on file     Review of Systems: General: No chills, fever, night sweats or weight changes  Cardiovascular:  No chest pain, dyspnea on exertion, edema, orthopnea, palpitations, paroxysmal nocturnal dyspnea Dermatological: No rash, lesions or masses Respiratory: No cough, dyspnea Urologic: No hematuria, dysuria Abdominal: No nausea, vomiting, diarrhea, bright red blood per rectum, melena, or hematemesis Neurologic: No visual changes, weakness, changes in mental status All other systems reviewed and are otherwise negative except as noted above.  Physical Exam: There were no vitals filed for this visit.  GEN- The patient is well appearing, alert and oriented x 3 today.   HEENT: normocephalic, atraumatic; sclera clear, conjunctiva pink; hearing intact; oropharynx clear; neck supple, no JVP Lymph- no cervical lymphadenopathy Lungs- Clear to ausculation bilaterally, normal work of breathing.  No wheezes, rales, rhonchi Heart- Regular rate and rhythm, no murmurs, rubs or gallops, PMI not laterally displaced GI- soft, non-tender, non-distended, bowel sounds present, no hepatosplenomegaly Extremities- no clubbing, cyanosis, or edema; DP/PT/radial pulses 2+ bilaterally MS- no significant deformity or atrophy Skin- warm and dry, no rash or lesion Psych- euthymic mood, full affect Neuro- strength and sensation are intact  EKG is not ordered today.  Assessment and Plan:  1. Persistent AF, atypical flutter Continue eliquis for CHA2DS2VASC of at least 8   Irregular on exam, rates controlled. She is off amiodarone for now with rate control plan and ? Amiodarone toxicity Not an AF ablation with age and LA size  2. Chronic systolic CHF Volume status *** NYHA ***  3. ? Amiodarone pulmonary toxicity CT chest 11/27/19 showed  bilateral consolidationand ground-glass opacities, raising concern for amio toxicity as contributing factor to her dyspnea. BAL with 21% eosinophils rasing concern for amiodarone vs acute eosinophilic pneumonia. Off amiodarone as above.  She is on steroid taper for 1-2  months per pulm.   4. 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    Shirley Friar, PA-C  12/10/19 10:46 AM

## 2019-12-10 NOTE — ED Notes (Signed)
Spoke with Dr. Liston Alba, pt was seen by her this morning, is set to move to hospice care tomorrow and was sent to [facility] today for pain control.

## 2019-12-11 ENCOUNTER — Ambulatory Visit: Payer: PPO | Admitting: Student

## 2019-12-11 ENCOUNTER — Telehealth: Payer: Self-pay | Admitting: Internal Medicine

## 2019-12-11 DIAGNOSIS — R0902 Hypoxemia: Secondary | ICD-10-CM | POA: Diagnosis not present

## 2019-12-11 DIAGNOSIS — W19XXXA Unspecified fall, initial encounter: Secondary | ICD-10-CM | POA: Diagnosis not present

## 2019-12-11 DIAGNOSIS — R52 Pain, unspecified: Secondary | ICD-10-CM | POA: Diagnosis not present

## 2019-12-11 NOTE — Telephone Encounter (Signed)
I spoke to the patient's husband who called to inform us that the patient's HR has been fluctuating between the 30s up to 118 throughout the day.    He said that she received a prescription for Oxycodone for pain from fall yesterday, breaking arm, but he was not able to pick it up yet from [employer].    They are presently awaiting ambulance transportation to a facility in [facility] for Hospice care, but wanted [place] to be aware of HR.

## 2019-12-11 NOTE — Telephone Encounter (Signed)
STAT if HR is under 50 or over 120 (normal HR is 60-100 beats per minute)  1) What is your heart rate?  Now it is 109, 118 , 4 minutes ago it was  33 and 38  2) Do you have a log of your heart rate readings (document readings)? Sometimes, not every day  3) Do you have any other symptoms? No energy

## 2019-12-13 NOTE — Telephone Encounter (Signed)
Thank you. That makes it make sound like she will be admitted to inpatient hospice?  Can we clarify on Monday?  Legrand Como [address]" [place], PA-C  12/13/2019 1:54 PM

## 2019-12-14 ENCOUNTER — Telehealth: Payer: Self-pay | Admitting: *Deleted

## 2019-12-14 NOTE — Telephone Encounter (Signed)
Pt husband called need to speak to a nurse about his wife, pt wife is in hospice. Please call [phone].

## 2019-12-14 NOTE — Telephone Encounter (Signed)
I called husband.  Pt is in hospice in [place] now after a fall fell and fractured her shoulder.  He states that per hospice that she will only have about 2 wks left.  He wanted to let JM/NP and PS/MD know.  I related that will relay.

## 2019-12-14 NOTE — Telephone Encounter (Signed)
Thank you  for update

## 2019-12-14 NOTE — Telephone Encounter (Signed)
I spoke to the patient's husband who verified that the patient has been admitted to inpatient hospice.  They are telling him that "she has lost her willingness to live and she is looking at days to weeks to live."  He verbalized understanding and thanked [place] for the call.

## 2019-12-14 NOTE — Telephone Encounter (Signed)
Thank you :)

## 2019-12-21 ENCOUNTER — Telehealth: Payer: Self-pay | Admitting: Internal Medicine

## 2019-12-21 NOTE — Telephone Encounter (Signed)
Left detailed message on named VM expressing condolences for pt's passing.

## 2019-12-23 ENCOUNTER — Inpatient Hospital Stay: Payer: PPO | Admitting: Internal Medicine

## 2019-12-31 LAB — FUNGAL ORGANISM REFLEX

## 2019-12-31 LAB — FUNGUS CULTURE WITH STAIN

## 2019-12-31 LAB — FUNGUS CULTURE RESULT

## 2020-01-07 DEATH — deceased

## 2020-01-21 LAB — ACID FAST CULTURE WITH REFLEXED SENSITIVITIES (MYCOBACTERIA): Acid Fast Culture: NEGATIVE
